# Patient Record
Sex: Female | Born: 1950
Health system: Southern US, Community
[De-identification: ages and names within clinical notes are randomized; demographics above are authoritative.]

## PROBLEM LIST (undated history)

## (undated) ENCOUNTER — Inpatient Hospital Stay: Admission: EM | Payer: Self-pay | Source: Home / Self Care

## (undated) DIAGNOSIS — E559 Vitamin D deficiency, unspecified: Secondary | ICD-10-CM

## (undated) DIAGNOSIS — I10 Essential (primary) hypertension: Secondary | ICD-10-CM

## (undated) DIAGNOSIS — I7090 Unspecified atherosclerosis: Secondary | ICD-10-CM

## (undated) DIAGNOSIS — Z801 Family history of malignant neoplasm of trachea, bronchus and lung: Secondary | ICD-10-CM

## (undated) DIAGNOSIS — K589 Irritable bowel syndrome without diarrhea: Secondary | ICD-10-CM

## (undated) DIAGNOSIS — D649 Anemia, unspecified: Secondary | ICD-10-CM

## (undated) DIAGNOSIS — M479 Spondylosis, unspecified: Secondary | ICD-10-CM

## (undated) DIAGNOSIS — K219 Gastro-esophageal reflux disease without esophagitis: Secondary | ICD-10-CM

## (undated) DIAGNOSIS — R042 Hemoptysis: Secondary | ICD-10-CM

## (undated) DIAGNOSIS — Z923 Personal history of irradiation: Secondary | ICD-10-CM

## (undated) DIAGNOSIS — G43909 Migraine, unspecified, not intractable, without status migrainosus: Secondary | ICD-10-CM

## (undated) DIAGNOSIS — R918 Other nonspecific abnormal finding of lung field: Secondary | ICD-10-CM

## (undated) DIAGNOSIS — E785 Hyperlipidemia, unspecified: Secondary | ICD-10-CM

## (undated) HISTORY — DX: Family history of malignant neoplasm of trachea, bronchus and lung: Z80.1

## (undated) HISTORY — DX: Essential (primary) hypertension: I10

## (undated) HISTORY — DX: Gastro-esophageal reflux disease without esophagitis: K21.9

## (undated) HISTORY — DX: Hemoptysis: R04.2

## (undated) HISTORY — DX: Spondylosis, unspecified: M47.9

## (undated) HISTORY — PX: TONSILLECTOMY: SUR1361

## (undated) HISTORY — DX: Other nonspecific abnormal finding of lung field: R91.8

## (undated) HISTORY — DX: Irritable bowel syndrome, unspecified: K58.9

## (undated) HISTORY — PX: EYE SURGERY: SHX253

## (undated) HISTORY — DX: Vitamin D deficiency, unspecified: E55.9

## (undated) HISTORY — DX: Unspecified atherosclerosis: I70.90

## (undated) HISTORY — DX: Migraine, unspecified, not intractable, without status migrainosus: G43.909

## (undated) HISTORY — DX: Hyperlipidemia, unspecified: E78.5

## (undated) HISTORY — DX: Anemia, unspecified: D64.9

---

## 2013-03-15 HISTORY — PX: BROW LIFT AND BLEPHAROPLASTY: SHX1271

## 2014-05-19 HISTORY — PX: COLONOSCOPY: SHX174

## 2014-06-23 HISTORY — PX: CATARACT EXTRACTION, BILATERAL: SHX1313

## 2017-04-18 DIAGNOSIS — C801 Malignant (primary) neoplasm, unspecified: Secondary | ICD-10-CM

## 2017-04-18 HISTORY — PX: ESOPHAGOGASTRODUODENOSCOPY: SHX1529

## 2017-04-18 HISTORY — DX: Malignant (primary) neoplasm, unspecified: C80.1

## 2018-02-28 ENCOUNTER — Other Ambulatory Visit: Payer: Self-pay | Admitting: Emergency Medicine

## 2018-02-28 ENCOUNTER — Ambulatory Visit
Admission: RE | Admit: 2018-02-28 | Discharge: 2018-02-28 | Disposition: A | Discharge status: Home / Self Care | Payer: Self-pay | Source: Ambulatory Visit | Attending: Emergency Medicine | Admitting: Emergency Medicine

## 2018-02-28 DIAGNOSIS — R911 Solitary pulmonary nodule: Secondary | ICD-10-CM

## 2018-03-01 ENCOUNTER — Ambulatory Visit (INDEPENDENT_AMBULATORY_CARE_PROVIDER_SITE_OTHER)
Admission: RE | Admit: 2018-03-01 | Discharge: 2018-03-01 | Disposition: A | Discharge status: Home / Self Care | Payer: Medicare Other | Source: Ambulatory Visit | Attending: Emergency Medicine | Admitting: Emergency Medicine

## 2018-03-01 ENCOUNTER — Telehealth: Payer: Self-pay | Admitting: Emergency Medicine

## 2018-03-01 ENCOUNTER — Ambulatory Visit (INDEPENDENT_AMBULATORY_CARE_PROVIDER_SITE_OTHER): Payer: Medicare Other | Admitting: Emergency Medicine

## 2018-03-01 ENCOUNTER — Encounter: Payer: Self-pay | Admitting: *Deleted

## 2018-03-01 VITALS — BP 138/84 | HR 96 | Ht 64.75 in | Wt 148.0 lb

## 2018-03-01 DIAGNOSIS — R918 Other nonspecific abnormal finding of lung field: Secondary | ICD-10-CM | POA: Diagnosis not present

## 2018-03-01 DIAGNOSIS — C3490 Malignant neoplasm of unspecified part of unspecified bronchus or lung: Secondary | ICD-10-CM | POA: Insufficient documentation

## 2018-03-01 NOTE — Patient Instructions (Signed)
We will arrange for navigational bronchoscopy in [facility] to further evaluate your lung nodule. We will work on obtaining a modified CT scan of your chest from [place].  If this is not possible then we will arrange to repeat your CT scan in [place] close to the time of the procedure. Follow with Dr Lamonte Sakai in 1 month to review testing results

## 2018-03-01 NOTE — Progress Notes (Signed)
Subjective:    Patient ID: [name], female    DOB: 27-Apr-1950, 67 y.o.   MRN: [id number]  HPI 67 year old never smoker with a history of hypertension, IBS, hyperlipidemia, GERD, allergies.  She is referred today for evaluation of an abnormal CT scan of her chest.   She noted some streaks of blood in her mucus about 6 months ago.  She had an ENT eval that was unremarkable. The amout progressed since September after an apparent URI.  This prompted a CT scan of her chest done on 02/23/2018 in [place] which I have reviewed.  There is an irregularly-shaped left lung central superior mass-like lesion. Appears to be in the medial LUL. She had a quant-GOLD done at her PCP, not yet back yet. She has had a mold exposure, unknown whether she has ever been exposed to TB.   Her cough is rare, but she does still see streaks of blood in the mucous. No CP. She can feel some tachycardia with exertion. Has to stop to rest with stairs, has dyspnea. No wheeze. No epistaxis.    Review of Systems  Constitutional: Negative for fever and unexpected weight change.  HENT: Positive for congestion. Negative for dental problem, ear pain, nosebleeds, postnasal drip, rhinorrhea, sinus pressure, sneezing, sore throat and trouble swallowing.   Eyes: Negative for redness and itching.  Respiratory: Positive for cough and shortness of breath. Negative for chest tightness and wheezing.   Cardiovascular: Negative for palpitations and leg swelling.  Gastrointestinal: Negative for nausea and vomiting.  Genitourinary: Negative for dysuria.  Musculoskeletal: Negative for joint swelling.  Skin: Negative for rash.  Neurological: Positive for headaches.  Hematological: Does not bruise/bleed easily.  Psychiatric/Behavioral: Negative for dysphoric mood. The patient is not nervous/anxious.    Past Medical History:  Diagnosis Date  . Anemia   . Atherosclerosis   . GERD (gastroesophageal reflux disease)   . Hemoptysis   .  Hyperlipidemia   . Hypertension   . IBS (irritable bowel syndrome)   . Lung mass   . Migraines   . Spondylosis   . Vitamin D deficiency      Family History  Problem Relation Age of Onset  . Dementia Mother   . Hyperlipidemia Mother   . Hypertension Mother   . Thyroid disease Mother   . Irritable bowel syndrome Mother   . Aortic stenosis Mother   . Cancer Father   . Lung cancer Sister      Social History   Socioeconomic History  . Marital status: Married    Spouse name: Not on file  . Number of children: Not on file  . Years of education: Not on file  . Highest education level: Not on file  Occupational History  . Not on file  Social Needs  . Financial resource strain: Not on file  . Food insecurity:    Worry: Not on file    Inability: Not on file  . Transportation needs:    Medical: Not on file    Non-medical: Not on file  Tobacco Use  . Smoking status: Never Smoker  . Smokeless tobacco: Never Used  Substance and Sexual Activity  . Alcohol use: Not on file  . Drug use: Not on file  . Sexual activity: Not on file  Lifestyle  . Physical activity:    Days per week: Not on file    Minutes per session: Not on file  . Stress: Not on file  Relationships  . Social connections:  Talks on phone: Not on file    Gets together: Not on file    Attends religious service: Not on file    Active member of club or organization: Not on file    Attends meetings of clubs or organizations: Not on file    Relationship status: Not on file  . Intimate partner violence:    Fear of current or ex partner: Not on file    Emotionally abused: Not on file    Physically abused: Not on file    Forced sexual activity: Not on file  Other Topics Concern  . Not on file  Social History Narrative  . 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    Allergies  Allergen Reactions  . Proton Pump Inhibitors Anaphylaxis  . Wasp Venom  Protein Swelling  . Aspartame Other (See Comments)    Severe diarrhea  . Avocado Other (See Comments)    Chest tightness, nauseous  . Cefaclor Hives  . Chloroxine Other (See Comments)    Severe migraine  . Ciprofloxacin Hcl Hives  . Clarithromycin Hives  . Gatifloxacin Hives  . Lactase Other (See Comments)  . Molds & Smuts Other (See Comments)  . Other Itching  . Penicillins Hives  . Pork-Derived Products Other (See Comments)    religious  . Soap Other (See Comments)    Soft soap- severe dermatitis  . Sulfa Antibiotics Hives  . Sulfamethoxazole-Trimethoprim Hives  . Sulfites Swelling    Turn red and throat swells     Outpatient Medications Prior to Visit  Medication Sig Dispense Refill  . Olopatadine HCl (PATANASE) 0.6 % SOLN Place 2 sprays into the nose 2 (two) times daily.    . Omega-3 1000 MG CAPS Take 2 capsules by mouth daily.    . rizatriptan (MAXALT) 10 MG tablet Take 10 mg by mouth as needed for migraine. May repeat in 2 hours if needed    . rosuvastatin (CRESTOR) 5 MG tablet Take 5 mg by mouth daily.    . sucralfate (CARAFATE) 1 g tablet Take 1 g by mouth 2 (two) times daily.    Marland Kitchen VITAMIN D PO Take 4,000 Units by mouth daily.    Marland Kitchen zolpidem (AMBIEN) 10 MG tablet Take 10 mg by mouth at bedtime as needed for sleep.    . clonazePAM (KLONOPIN) 0.5 MG tablet Take 0.5 mg by mouth 3 (three) times daily as needed for anxiety.    . metoprolol tartrate (LOPRESSOR) 25 MG tablet Take 25 mg by mouth daily as needed.    . promethazine (PHENERGAN) 12.5 MG suppository Place 12.5 mg rectally every 6 (six) hours as needed for nausea or vomiting.     No facility-administered medications prior to visit.         Objective:   Physical Exam Vitals:   03/01/18 1100  BP: 138/84  Pulse: 96  SpO2: 96%  Weight: 148 lb (67.1 kg)  Height: 5' 4.75" (1.645 m)   Gen: Pleasant, well-nourished, in no distress,  normal affect  ENT: No lesions,  mouth clear,  oropharynx clear, no postnasal  drip  Neck: No JVD, no stridor  Lungs: No use of accessory muscles, no crackles or wheezing  Cardiovascular: RRR, heart sounds normal, no murmur or gallops, no peripheral edema  Musculoskeletal: No deformities, no cyanosis or clubbing  Neuro: alert, non focal  Skin: Warm, no lesions or rash  Assessment & Plan:  Lung mass Medial lung opacity on the left that appears to be in the left upper lobe, just over 3 cm in its largest dimension.  Based on location I think navigational bronchoscopy is the most likely to achieve an adequate tissue diagnosis.  She had a QuantiFERON gold which is still pending and we will follow-up these results.  I will communicate with the radiology group in [place] to see if we can convert her CT over to super D format.  If not then we will repeat her CT locally.  Baltazar Apo, MD, PhD 03/01/2018, 12:02 PM [facility] [phone] or if no answer [phone]

## 2018-03-01 NOTE — H&P (View-Only) (Signed)
Subjective:    Patient ID: [name], female    DOB: [birth year], 67 y.o.   MRN: [id number]  HPI 67 year old never smoker with a history of hypertension, IBS, hyperlipidemia, GERD, allergies.  She is referred today for evaluation of an abnormal CT scan of her chest.   She noted some streaks of blood in her mucus about 6 months ago.  She had an ENT eval that was unremarkable. The amout progressed since September after an apparent URI.  This prompted a CT scan of her chest done on 02/23/2018 in [place] which I have reviewed.  There is an irregularly-shaped left lung central superior mass-like lesion. Appears to be in the medial LUL. She had a quant-GOLD done at her PCP, not yet back yet. She has had a mold exposure, unknown whether she has ever been exposed to TB.   Her cough is rare, but she does still see streaks of blood in the mucous. No CP. She can feel some tachycardia with exertion. Has to stop to rest with stairs, has dyspnea. No wheeze. No epistaxis.    Review of Systems  Constitutional: Negative for fever and unexpected weight change.  HENT: Positive for congestion. Negative for dental problem, ear pain, nosebleeds, postnasal drip, rhinorrhea, sinus pressure, sneezing, sore throat and trouble swallowing.   Eyes: Negative for redness and itching.  Respiratory: Positive for cough and shortness of breath. Negative for chest tightness and wheezing.   Cardiovascular: Negative for palpitations and leg swelling.  Gastrointestinal: Negative for nausea and vomiting.  Genitourinary: Negative for dysuria.  Musculoskeletal: Negative for joint swelling.  Skin: Negative for rash.  Neurological: Positive for headaches.  Hematological: Does not bruise/bleed easily.  Psychiatric/Behavioral: Negative for dysphoric mood. The patient is not nervous/anxious.    Past Medical History:  Diagnosis Date  . Anemia   . Atherosclerosis   . GERD (gastroesophageal reflux disease)   . Hemoptysis   .  Hyperlipidemia   . Hypertension   . IBS (irritable bowel syndrome)   . Lung mass   . Migraines   . Spondylosis   . Vitamin D deficiency      Family History  Problem Relation Age of Onset  . Dementia Mother   . Hyperlipidemia Mother   . Hypertension Mother   . Thyroid disease Mother   . Irritable bowel syndrome Mother   . Aortic stenosis Mother   . Cancer Father   . Lung cancer Sister      Social History   Socioeconomic History  . Marital status: Married    Spouse name: Not on file  . Number of children: Not on file  . Years of education: Not on file  . Highest education level: Not on file  Occupational History  . Not on file  Social Needs  . Financial resource strain: Not on file  . Food insecurity:    Worry: Not on file    Inability: Not on file  . Transportation needs:    Medical: Not on file    Non-medical: Not on file  Tobacco Use  . Smoking status: Never Smoker  . Smokeless tobacco: Never Used  Substance and Sexual Activity  . Alcohol use: Not on file  . Drug use: Not on file  . Sexual activity: Not on file  Lifestyle  . Physical activity:    Days per week: Not on file    Minutes per session: Not on file  . Stress: Not on file  Relationships  . Social connections:  Talks on phone: Not on file    Gets together: Not on file    Attends religious service: Not on file    Active member of club or organization: Not on file    Attends meetings of clubs or organizations: Not on file    Relationship status: Not on file  . Intimate partner violence:    Fear of current or ex partner: Not on file    Emotionally abused: Not on file    Physically abused: Not on file    Forced sexual activity: Not on file  Other Topics Concern  . Not on file  Social History Narrative  . 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    Allergies  Allergen Reactions  . Proton Pump Inhibitors Anaphylaxis  . Wasp Venom  Protein Swelling  . Aspartame Other (See Comments)    Severe diarrhea  . Avocado Other (See Comments)    Chest tightness, nauseous  . Cefaclor Hives  . Chloroxine Other (See Comments)    Severe migraine  . Ciprofloxacin Hcl Hives  . Clarithromycin Hives  . Gatifloxacin Hives  . Lactase Other (See Comments)  . Molds & Smuts Other (See Comments)  . Other Itching  . Penicillins Hives  . Pork-Derived Products Other (See Comments)    religious  . Soap Other (See Comments)    Soft soap- severe dermatitis  . Sulfa Antibiotics Hives  . Sulfamethoxazole-Trimethoprim Hives  . Sulfites Swelling    Turn red and throat swells     Outpatient Medications Prior to Visit  Medication Sig Dispense Refill  . Olopatadine HCl (PATANASE) 0.6 % SOLN Place 2 sprays into the nose 2 (two) times daily.    . Omega-3 1000 MG CAPS Take 2 capsules by mouth daily.    . rizatriptan (MAXALT) 10 MG tablet Take 10 mg by mouth as needed for migraine. May repeat in 2 hours if needed    . rosuvastatin (CRESTOR) 5 MG tablet Take 5 mg by mouth daily.    . sucralfate (CARAFATE) 1 g tablet Take 1 g by mouth 2 (two) times daily.    Marland Kitchen VITAMIN D PO Take 4,000 Units by mouth daily.    Marland Kitchen zolpidem (AMBIEN) 10 MG tablet Take 10 mg by mouth at bedtime as needed for sleep.    . clonazePAM (KLONOPIN) 0.5 MG tablet Take 0.5 mg by mouth 3 (three) times daily as needed for anxiety.    . metoprolol tartrate (LOPRESSOR) 25 MG tablet Take 25 mg by mouth daily as needed.    . promethazine (PHENERGAN) 12.5 MG suppository Place 12.5 mg rectally every 6 (six) hours as needed for nausea or vomiting.     No facility-administered medications prior to visit.         Objective:   Physical Exam Vitals:   03/01/18 1100  BP: 138/84  Pulse: 96  SpO2: 96%  Weight: 148 lb (67.1 kg)  Height: 5' 4.75" (1.645 m)   Gen: Pleasant, well-nourished, in no distress,  normal affect  ENT: No lesions,  mouth clear,  oropharynx clear, no postnasal  drip  Neck: No JVD, no stridor  Lungs: No use of accessory muscles, no crackles or wheezing  Cardiovascular: RRR, heart sounds normal, no murmur or gallops, no peripheral edema  Musculoskeletal: No deformities, no cyanosis or clubbing  Neuro: alert, non focal  Skin: Warm, no lesions or rash  Assessment & Plan:  Lung mass Medial lung opacity on the left that appears to be in the left upper lobe, just over 3 cm in its largest dimension.  Based on location I think navigational bronchoscopy is the most likely to achieve an adequate tissue diagnosis.  She had a QuantiFERON gold which is still pending and we will follow-up these results.  I will communicate with the radiology group in [place] to see if we can convert her CT over to super D format.  If not then we will repeat her CT locally.  Baltazar Apo, MD, PhD 03/01/2018, 12:02 PM [facility] [phone] or if no answer [phone]

## 2018-03-01 NOTE — Telephone Encounter (Signed)
Called and spoke with Anda Kraft she stated that she was able to perform the requested "slices". She stated that she will mail [place] the CT cd so that we will have the images. Nothing further needed.

## 2018-03-01 NOTE — Addendum Note (Signed)
Addended by: Desmond Dike C on: 03/01/2018 12:26 PM   Modules accepted: Orders

## 2018-03-01 NOTE — Assessment & Plan Note (Signed)
Medial lung opacity on the left that appears to be in the left upper lobe, just over 3 cm in its largest dimension.  Based on location I think navigational bronchoscopy is the most likely to achieve an adequate tissue diagnosis.  She had a QuantiFERON gold which is still pending and we will follow-up these results.  I will communicate with the radiology group in [place] to see if we can convert her CT over to super D format.  If not then we will repeat her CT locally.

## 2018-03-05 ENCOUNTER — Telehealth: Payer: Self-pay | Admitting: Emergency Medicine

## 2018-03-05 NOTE — Telephone Encounter (Signed)
Called number given, unable to reach anyone at extension given as this is not an extension. Spoke with office administration and asked for [name]. No one was available. Called patient. Unable to reach left message to give [place] a call back.

## 2018-03-05 NOTE — Pre-Procedure Instructions (Signed)
[name]  03/05/2018      [id number] [address], [place] [address] Phone: [phone] Fax: [phone]    Your procedure is scheduled on Wed., Nov. 20, 2019 from 10:48AM-12:18PM  Report to [facility] Admitting Entrance "A" at 8:45AM  Call this number if you have problems the morning of surgery:  [phone]   Remember:  Do not eat or drink after midnight on Nov. 19th    Take these medicines the morning of surgery with A SIP OF WATER: NONE  As of today, stop taking all Other Aspirin Products, Vitamins, Fish oils, and Herbal medications. Also stop all NSAIDS i.e. Advil, Ibuprofen, Motrin, Aleve, Anaprox, Naproxen, BC, Goody Powders, and all Supplements.   Do not wear jewelry, make-up or nail polish.  Do not wear lotions, powders, or perfumes, or deodorant.  Do not shave 48 hours prior to surgery.  .  Do not bring valuables to the hospital.  [facility] is not responsible for any belongings or valuables.  Contacts, dentures or bridgework may not be worn into surgery.  Leave your suitcase in the car.  After surgery it may be brought to your room.  For patients admitted to the hospital, discharge time will be determined by your treatment team.  Patients discharged the day of surgery will not be allowed to drive home.   Special instructions:   [place]- Preparing For Surgery  Before surgery, you can play an important role. Because skin is not sterile, your skin needs to be as free of germs as possible. You can reduce the number of germs on your skin by washing with CHG (chlorahexidine gluconate) Soap before surgery.  CHG is an antiseptic cleaner which kills germs and bonds with the skin to continue killing germs even after washing.    Oral Hygiene is also important to reduce your risk of infection.  Remember - BRUSH YOUR TEETH THE MORNING OF SURGERY WITH YOUR REGULAR  TOOTHPASTE  Please do not use if you have an allergy to CHG or antibacterial soaps. If your skin becomes reddened/irritated stop using the CHG.  Do not shave (including legs and underarms) for at least 48 hours prior to first CHG shower. It is OK to shave your face.  Please follow these instructions carefully.   1. Shower the NIGHT BEFORE SURGERY and the MORNING OF SURGERY with CHG.   2. If you chose to wash your hair, wash your hair first as usual with your normal shampoo.  3. After you shampoo, rinse your hair and body thoroughly to remove the shampoo.  4. Use CHG as you would any other liquid soap. You can apply CHG directly to the skin and wash gently with a scrungie or a clean washcloth.   5. Apply the CHG Soap to your body ONLY FROM THE NECK DOWN.  Do not use on open wounds or open sores. Avoid contact with your eyes, ears, mouth and genitals (private parts). Wash Face and genitals (private parts)  with your normal soap.  6. Wash thoroughly, paying special attention to the area where your surgery will be performed.  7. Thoroughly rinse your body with warm water from the neck down.  8. DO NOT shower/wash with your normal soap after using and rinsing off the CHG Soap.  9. Pat yourself dry with a CLEAN TOWEL.  10. Wear CLEAN PAJAMAS to bed the night  before surgery, wear comfortable clothes the morning of surgery  11. Place CLEAN SHEETS on your bed the night of your first shower and DO NOT SLEEP WITH PETS.  Day of Surgery:  Do not apply any deodorants/lotions.  Please wear clean clothes to the hospital/surgery center.   Remember to brush your teeth WITH YOUR REGULAR TOOTHPASTE.  Please read over the following fact sheets that you were given. Pain Booklet, Coughing and Deep Breathing and Surgical Site Infection Prevention

## 2018-03-06 ENCOUNTER — Encounter (HOSPITAL_COMMUNITY): Payer: Self-pay

## 2018-03-06 ENCOUNTER — Other Ambulatory Visit: Payer: Self-pay

## 2018-03-06 ENCOUNTER — Encounter (HOSPITAL_COMMUNITY)
Admission: RE | Admit: 2018-03-06 | Discharge: 2018-03-06 | Disposition: A | Discharge status: Home / Self Care | Payer: Medicare Other | Source: Ambulatory Visit | Attending: Emergency Medicine | Admitting: Emergency Medicine

## 2018-03-06 ENCOUNTER — Ambulatory Visit (HOSPITAL_COMMUNITY): Admission: RE | Admit: 2018-03-06 | Payer: Medicare Other | Source: Ambulatory Visit

## 2018-03-06 DIAGNOSIS — Z881 Allergy status to other antibiotic agents status: Secondary | ICD-10-CM | POA: Diagnosis not present

## 2018-03-06 DIAGNOSIS — E559 Vitamin D deficiency, unspecified: Secondary | ICD-10-CM | POA: Diagnosis not present

## 2018-03-06 DIAGNOSIS — Z882 Allergy status to sulfonamides status: Secondary | ICD-10-CM | POA: Diagnosis not present

## 2018-03-06 DIAGNOSIS — K219 Gastro-esophageal reflux disease without esophagitis: Secondary | ICD-10-CM | POA: Diagnosis not present

## 2018-03-06 DIAGNOSIS — R911 Solitary pulmonary nodule: Secondary | ICD-10-CM | POA: Diagnosis present

## 2018-03-06 DIAGNOSIS — Z9889 Other specified postprocedural states: Secondary | ICD-10-CM

## 2018-03-06 DIAGNOSIS — Z880 Allergy status to penicillin: Secondary | ICD-10-CM | POA: Diagnosis not present

## 2018-03-06 DIAGNOSIS — C3432 Malignant neoplasm of lower lobe, left bronchus or lung: Secondary | ICD-10-CM | POA: Diagnosis not present

## 2018-03-06 DIAGNOSIS — I10 Essential (primary) hypertension: Secondary | ICD-10-CM | POA: Insufficient documentation

## 2018-03-06 DIAGNOSIS — Z01818 Encounter for other preprocedural examination: Secondary | ICD-10-CM

## 2018-03-06 DIAGNOSIS — K589 Irritable bowel syndrome without diarrhea: Secondary | ICD-10-CM | POA: Diagnosis not present

## 2018-03-06 DIAGNOSIS — Z79899 Other long term (current) drug therapy: Secondary | ICD-10-CM | POA: Diagnosis not present

## 2018-03-06 DIAGNOSIS — E785 Hyperlipidemia, unspecified: Secondary | ICD-10-CM | POA: Diagnosis not present

## 2018-03-06 LAB — COMPREHENSIVE METABOLIC PANEL
ALT: 20 U/L (ref 0–44)
AST: 21 U/L (ref 15–41)
Albumin: 4 g/dL (ref 3.5–5.0)
Alkaline Phosphatase: 45 U/L (ref 38–126)
Anion gap: 8 (ref 5–15)
BUN: 20 mg/dL (ref 8–23)
CO2: 24 mmol/L (ref 22–32)
Calcium: 9.3 mg/dL (ref 8.9–10.3)
Chloride: 105 mmol/L (ref 98–111)
Creatinine, Ser: 0.85 mg/dL (ref 0.44–1.00)
GFR calc Af Amer: >60 mL/min (ref >60)
GFR calc non Af Amer: >60 mL/min (ref >60)
Glucose, Bld: 130 mg/dL — ABNORMAL HIGH (ref 70–99)
Potassium: 3.4 mmol/L — ABNORMAL LOW (ref 3.5–5.1)
Sodium: 137 mmol/L (ref 135–145)
Total Bilirubin: 0.4 mg/dL (ref 0.3–1.2)
Total Protein: 6.7 g/dL (ref 6.5–8.1)

## 2018-03-06 LAB — CBC
HCT: 36.6 % (ref 36.0–46.0)
Hemoglobin: 11.5 g/dL — ABNORMAL LOW (ref 12.0–15.0)
MCH: 30.3 pg (ref 26.0–34.0)
MCHC: 31.4 g/dL (ref 30.0–36.0)
MCV: 96.3 fL (ref 80.0–100.0)
Platelets: 286 10*3/uL (ref 150–400)
RBC: 3.8 MIL/uL — ABNORMAL LOW (ref 3.87–5.11)
RDW: 11.9 % (ref 11.5–15.5)
WBC: 9.2 10*3/uL (ref 4.0–10.5)
nRBC: 0 % (ref 0.0–0.2)

## 2018-03-06 LAB — PROTIME-INR
INR: 0.94
Prothrombin Time: 12.5 seconds (ref 11.4–15.2)

## 2018-03-06 LAB — APTT: aPTT: 31 seconds (ref 24–36)

## 2018-03-06 NOTE — Progress Notes (Signed)
PCP - Dr. Lillette Boxer Cardiologist - Dr. Jennette Dubin  Chest x-ray - 03/06/18 EKG - 03/06/18 Stress Test - 5 years ago in [place]  ECHO - requested; 2018? At [facility] Cardiac Cath - denies  Sleep Study - denies  Aspirin Instructions:N/A  Anesthesia review: Yes; requested ECHo results.   Patient denies shortness of breath, fever, cough and chest pain at PAT appointment   Patient verbalized understanding of instructions that were given to them at the PAT appointment. Patient was also instructed that they will need to review over the PAT instructions again at home before surgery.

## 2018-03-06 NOTE — Progress Notes (Signed)
Pt not given CHG soap pre-operatively d/t severe allergy.

## 2018-03-06 NOTE — Telephone Encounter (Signed)
Attempted to contact pt. I did not receive an answer. I have left a message for pt to return our call.  

## 2018-03-07 ENCOUNTER — Encounter (HOSPITAL_COMMUNITY): Payer: Self-pay | Admitting: *Deleted

## 2018-03-07 ENCOUNTER — Ambulatory Visit (HOSPITAL_COMMUNITY): Payer: Medicare Other | Admitting: Certified Registered"

## 2018-03-07 ENCOUNTER — Ambulatory Visit (HOSPITAL_COMMUNITY)
Admission: RE | Admit: 2018-03-07 | Discharge: 2018-03-07 | Disposition: A | Discharge status: Home / Self Care | Payer: Medicare Other | Source: Ambulatory Visit | Attending: Emergency Medicine | Admitting: Emergency Medicine

## 2018-03-07 ENCOUNTER — Encounter (HOSPITAL_COMMUNITY)
Admission: RE | Disposition: A | Discharge status: Home / Self Care | Payer: Self-pay | Source: Ambulatory Visit | Attending: Emergency Medicine

## 2018-03-07 ENCOUNTER — Ambulatory Visit (HOSPITAL_COMMUNITY): Payer: Medicare Other

## 2018-03-07 ENCOUNTER — Ambulatory Visit (HOSPITAL_COMMUNITY): Payer: Medicare Other | Admitting: Physician Assistant

## 2018-03-07 DIAGNOSIS — I10 Essential (primary) hypertension: Secondary | ICD-10-CM | POA: Diagnosis not present

## 2018-03-07 DIAGNOSIS — Z9889 Other specified postprocedural states: Secondary | ICD-10-CM

## 2018-03-07 DIAGNOSIS — Z882 Allergy status to sulfonamides status: Secondary | ICD-10-CM | POA: Insufficient documentation

## 2018-03-07 DIAGNOSIS — K219 Gastro-esophageal reflux disease without esophagitis: Secondary | ICD-10-CM | POA: Insufficient documentation

## 2018-03-07 DIAGNOSIS — Z881 Allergy status to other antibiotic agents status: Secondary | ICD-10-CM | POA: Insufficient documentation

## 2018-03-07 DIAGNOSIS — Z880 Allergy status to penicillin: Secondary | ICD-10-CM | POA: Insufficient documentation

## 2018-03-07 DIAGNOSIS — E559 Vitamin D deficiency, unspecified: Secondary | ICD-10-CM | POA: Insufficient documentation

## 2018-03-07 DIAGNOSIS — C3432 Malignant neoplasm of lower lobe, left bronchus or lung: Secondary | ICD-10-CM | POA: Diagnosis not present

## 2018-03-07 DIAGNOSIS — R918 Other nonspecific abnormal finding of lung field: Secondary | ICD-10-CM | POA: Diagnosis not present

## 2018-03-07 DIAGNOSIS — Z79899 Other long term (current) drug therapy: Secondary | ICD-10-CM | POA: Insufficient documentation

## 2018-03-07 DIAGNOSIS — E785 Hyperlipidemia, unspecified: Secondary | ICD-10-CM | POA: Diagnosis not present

## 2018-03-07 DIAGNOSIS — K589 Irritable bowel syndrome without diarrhea: Secondary | ICD-10-CM | POA: Insufficient documentation

## 2018-03-07 DIAGNOSIS — C3490 Malignant neoplasm of unspecified part of unspecified bronchus or lung: Secondary | ICD-10-CM | POA: Diagnosis present

## 2018-03-07 DIAGNOSIS — R911 Solitary pulmonary nodule: Secondary | ICD-10-CM

## 2018-03-07 HISTORY — PX: VIDEO BRONCHOSCOPY WITH ENDOBRONCHIAL NAVIGATION: SHX6175

## 2018-03-07 SURGERY — VIDEO BRONCHOSCOPY WITH ENDOBRONCHIAL NAVIGATION
Anesthesia: General | Site: Bronchus

## 2018-03-07 MED ORDER — FENTANYL CITRATE (PF) 100 MCG/2ML IJ SOLN
INTRAMUSCULAR | Status: DC | PRN
  Administered 2018-03-07: 50 ug via INTRAVENOUS

## 2018-03-07 MED ORDER — FENTANYL CITRATE (PF) 250 MCG/5ML IJ SOLN
INTRAMUSCULAR | Status: AC
  Filled 2018-03-07: qty 5

## 2018-03-07 MED ORDER — OXYCODONE HCL 5 MG PO TABS: 5.0000 mg | ORAL_TABLET | Freq: Once | ORAL | Status: DC | PRN

## 2018-03-07 MED ORDER — ONDANSETRON HCL 4 MG/2ML IJ SOLN
INTRAMUSCULAR | Status: AC
  Filled 2018-03-07: qty 2

## 2018-03-07 MED ORDER — 0.9 % SODIUM CHLORIDE (POUR BTL) OPTIME
TOPICAL | Status: DC | PRN
  Administered 2018-03-07: 1000 mL

## 2018-03-07 MED ORDER — ACETAMINOPHEN 10 MG/ML IV SOLN: 1000.0000 mg | Freq: Once | INTRAVENOUS | Status: DC | PRN

## 2018-03-07 MED ORDER — LIDOCAINE 2% (20 MG/ML) 5 ML SYRINGE
INTRAMUSCULAR | Status: DC | PRN
  Administered 2018-03-07: 60 mg via INTRAVENOUS

## 2018-03-07 MED ORDER — OXYCODONE HCL 5 MG/5ML PO SOLN: 5.0000 mg | Freq: Once | ORAL | Status: DC | PRN

## 2018-03-07 MED ORDER — ARTIFICIAL TEARS OPHTHALMIC OINT
TOPICAL_OINTMENT | OPHTHALMIC | Status: AC
  Filled 2018-03-07: qty 3.5

## 2018-03-07 MED ORDER — DEXAMETHASONE SODIUM PHOSPHATE 10 MG/ML IJ SOLN
INTRAMUSCULAR | Status: AC
  Filled 2018-03-07: qty 1

## 2018-03-07 MED ORDER — ROCURONIUM BROMIDE 50 MG/5ML IV SOSY
PREFILLED_SYRINGE | INTRAVENOUS | Status: AC
  Filled 2018-03-07: qty 5

## 2018-03-07 MED ORDER — LACTATED RINGERS IV SOLN
INTRAVENOUS | Status: DC
  Administered 2018-03-07: 10:00:00 via INTRAVENOUS

## 2018-03-07 MED ORDER — PROPOFOL 10 MG/ML IV BOLUS
INTRAVENOUS | Status: AC
  Filled 2018-03-07: qty 20

## 2018-03-07 MED ORDER — MIDAZOLAM HCL 2 MG/2ML IJ SOLN
INTRAMUSCULAR | Status: AC
  Filled 2018-03-07: qty 2

## 2018-03-07 MED ORDER — ACETAMINOPHEN 160 MG/5ML PO SOLN: 1000.0000 mg | Freq: Once | ORAL | Status: DC | PRN

## 2018-03-07 MED ORDER — LIDOCAINE 2% (20 MG/ML) 5 ML SYRINGE
INTRAMUSCULAR | Status: AC
  Filled 2018-03-07: qty 5

## 2018-03-07 MED ORDER — SODIUM CHLORIDE 0.9 % IV SOLN
INTRAVENOUS | Status: DC | PRN
  Administered 2018-03-07: 100 ug/min via INTRAVENOUS

## 2018-03-07 MED ORDER — PROPOFOL 10 MG/ML IV BOLUS
INTRAVENOUS | Status: DC | PRN
  Administered 2018-03-07: 40 mg via INTRAVENOUS
  Administered 2018-03-07: 30 mg via INTRAVENOUS
  Administered 2018-03-07: 130 mg via INTRAVENOUS

## 2018-03-07 MED ORDER — SUGAMMADEX SODIUM 200 MG/2ML IV SOLN
INTRAVENOUS | Status: DC | PRN
  Administered 2018-03-07: 140 mg via INTRAVENOUS
  Administered 2018-03-07: 60 mg via INTRAVENOUS

## 2018-03-07 MED ORDER — ACETAMINOPHEN 500 MG PO TABS: 1000.0000 mg | ORAL_TABLET | Freq: Once | ORAL | Status: DC | PRN

## 2018-03-07 MED ORDER — SUGAMMADEX SODIUM 200 MG/2ML IV SOLN
INTRAVENOUS | Status: AC
  Filled 2018-03-07: qty 2

## 2018-03-07 MED ORDER — MIDAZOLAM HCL 2 MG/2ML IJ SOLN
INTRAMUSCULAR | Status: DC | PRN
  Administered 2018-03-07: 2 mg via INTRAVENOUS

## 2018-03-07 MED ORDER — FENTANYL CITRATE (PF) 100 MCG/2ML IJ SOLN: 25.0000 ug | INTRAMUSCULAR | Status: DC | PRN

## 2018-03-07 MED ORDER — ONDANSETRON HCL 4 MG/2ML IJ SOLN
INTRAMUSCULAR | Status: DC | PRN
  Administered 2018-03-07: 4 mg via INTRAVENOUS

## 2018-03-07 MED ORDER — DEXAMETHASONE SODIUM PHOSPHATE 10 MG/ML IJ SOLN
INTRAMUSCULAR | Status: DC | PRN
  Administered 2018-03-07: 10 mg via INTRAVENOUS

## 2018-03-07 MED ORDER — ROCURONIUM BROMIDE 50 MG/5ML IV SOSY
PREFILLED_SYRINGE | INTRAVENOUS | Status: DC | PRN
  Administered 2018-03-07: 20 mg via INTRAVENOUS
  Administered 2018-03-07: 50 mg via INTRAVENOUS

## 2018-03-07 SURGICAL SUPPLY — 42 items
ADAPTER BRONCHOSCOPE OLYMPUS (ADAPTER) ×2 IMPLANT
ADAPTER VALVE BIOPSY EBUS (MISCELLANEOUS) IMPLANT
ADPTR VALVE BIOPSY EBUS (MISCELLANEOUS)
BRUSH CYTOL CELLEBRITY 1.5X140 (MISCELLANEOUS) ×2 IMPLANT
BRUSH SUPERTRAX BIOPSY (INSTRUMENTS) IMPLANT
BRUSH SUPERTRAX NDL-TIP CYTO (INSTRUMENTS) ×2 IMPLANT
CANISTER SUCT 3000ML PPV (MISCELLANEOUS) ×2 IMPLANT
CHANNEL WORK EXTEND EDGE 180 (KITS) IMPLANT
CHANNEL WORK EXTEND EDGE 90 (KITS) IMPLANT
CONT SPEC 4OZ CLIKSEAL STRL BL (MISCELLANEOUS) ×2 IMPLANT
COVER BACK TABLE 60X90IN (DRAPES) ×2 IMPLANT
COVER WAND RF STERILE (DRAPES) ×2 IMPLANT
FILTER STRAW FLUID ASPIR (MISCELLANEOUS) IMPLANT
FORCEPS BIOP SUPERTRX PREMAR (INSTRUMENTS) ×2 IMPLANT
GAUZE SPONGE 4X4 12PLY STRL (GAUZE/BANDAGES/DRESSINGS) ×2 IMPLANT
GLOVE BIO SURGEON STRL SZ7.5 (GLOVE) ×4 IMPLANT
GOWN STRL REUS W/ TWL LRG LVL3 (GOWN DISPOSABLE) ×2 IMPLANT
GOWN STRL REUS W/TWL LRG LVL3 (GOWN DISPOSABLE) ×2
KIT CLEAN ENDO COMPLIANCE (KITS) ×2 IMPLANT
KIT ENDOBRONCHIAL EDGE FIRM (KITS) ×2 IMPLANT
KIT LOCATABLE GUIDE (CANNULA) IMPLANT
KIT MARKER FIDUCIAL DELIVERY (KITS) IMPLANT
KIT PROCEDURE EDGE 180 (KITS) IMPLANT
KIT PROCEDURE EDGE 90 (KITS) ×2 IMPLANT
KIT TURNOVER KIT B (KITS) ×2 IMPLANT
MARKER SKIN DUAL TIP RULER LAB (MISCELLANEOUS) ×2 IMPLANT
NEEDLE SUPERTRX PREMARK BIOPSY (NEEDLE) ×4 IMPLANT
NS IRRIG 1000ML POUR BTL (IV SOLUTION) ×2 IMPLANT
OIL SILICONE PENTAX (PARTS (SERVICE/REPAIRS)) ×2 IMPLANT
PAD ARMBOARD 7.5X6 YLW CONV (MISCELLANEOUS) ×4 IMPLANT
PATCHES PATIENT (LABEL) ×6 IMPLANT
SYR 20CC LL (SYRINGE) ×2 IMPLANT
SYR 20ML ECCENTRIC (SYRINGE) ×2 IMPLANT
SYR 50ML SLIP (SYRINGE) ×2 IMPLANT
TOWEL OR 17X24 6PK STRL BLUE (TOWEL DISPOSABLE) ×2 IMPLANT
TRAP SPECIMEN MUCOUS 40CC (MISCELLANEOUS) IMPLANT
TUBE CONNECTING 20X1/4 (TUBING) ×2 IMPLANT
UNDERPAD 30X30 (UNDERPADS AND DIAPERS) ×2 IMPLANT
VALVE BIOPSY  SINGLE USE (MISCELLANEOUS) ×1
VALVE BIOPSY SINGLE USE (MISCELLANEOUS) ×1 IMPLANT
VALVE SUCTION BRONCHIO DISP (MISCELLANEOUS) ×2 IMPLANT
WATER STERILE IRR 1000ML POUR (IV SOLUTION) ×2 IMPLANT

## 2018-03-07 NOTE — Interval H&P Note (Signed)
PCCM Interval Note  Patient presents today for further evaluation of a left upper lobe nodule and hemoptysis.  She has not had any difficulty since our last visit.  She continues to cough and the last time she saw some scant blood was 2 days ago.   Stable exam.  All questions answered regarding the procedure.  Plan is for navigational bronchoscopy with transbronchial brushings, biopsies and washings of a left upper lobe nodule.  BMP Latest Ref Rng & Units 03/06/2018  Glucose 70 - 99 mg/dL 130(H)  BUN 8 - 23 mg/dL 20  Creatinine 0.44 - 1.00 mg/dL 0.85  Sodium 135 - 145 mmol/L 137  Potassium 3.5 - 5.1 mmol/L 3.4(L)  Chloride 98 - 111 mmol/L 105  CO2 22 - 32 mmol/L 24  Calcium 8.9 - 10.3 mg/dL 9.3   CBC Latest Ref Rng & Units 03/06/2018  WBC 4.0 - 10.5 K/uL 9.2  Hemoglobin 12.0 - 15.0 g/dL 11.5(L)  Hematocrit 36.0 - 46.0 % 36.6  Platelets 150 - 400 K/uL 286   No barriers identified.  Plan to proceed with ENB today.  Baltazar Apo, MD, PhD 03/07/2018, 11:07 AM [facility] [phone] or if no answer [phone]

## 2018-03-07 NOTE — Transfer of Care (Signed)
Immediate Anesthesia Transfer of Care Note  Patient: [name]  Procedure(s) Performed: VIDEO BRONCHOSCOPY WITH ENDOBRONCHIAL NAVIGATION (N/A Bronchus)  Patient Location: PACU  Anesthesia Type:General  Level of Consciousness: awake, alert  and oriented  Airway & Oxygen Therapy: Patient Spontanous Breathing  Post-op Assessment: Report given to RN  Post vital signs: Reviewed and stable  Last Vitals:  Vitals Value Taken Time  BP    Temp    Pulse 94 03/07/2018 12:38 PM  Resp 18 03/07/2018 12:38 PM  SpO2 100 % 03/07/2018 12:38 PM  Vitals shown include unvalidated device data.  Last Pain:  Vitals:   03/07/18 0942  TempSrc:   PainSc: 0-No pain         Complications: No apparent anesthesia complications

## 2018-03-07 NOTE — Discharge Instructions (Signed)
Flexible Bronchoscopy, Care After These instructions give you information on caring for yourself after your procedure. Your doctor may also give you more specific instructions. Call your doctor if you have any problems or questions after your procedure. Follow these instructions at home:  Do not eat or drink anything for 2 hours after your procedure. If you try to eat or drink before the medicine wears off, food or drink could go into your lungs. You could also burn yourself.  After 2 hours have passed and when you can cough and gag normally, you may eat soft food and drink liquids slowly.  The day after the test, you may eat your normal diet.  You may do your normal activities.  Keep all doctor visits. Get help right away if:  You get more and more short of breath.  You get light-headed.  You feel like you are going to pass out (faint).  You have chest pain.  You have new problems that worry you.  You cough up more than a little blood.  You cough up more blood than before.  Please call our office for any questions or concerns.  [phone].   This information is not intended to replace advice given to you by your health care provider. Make sure you discuss any questions you have with your health care provider. Document Released: 01/30/2009 Document Revised: 09/10/2015 Document Reviewed: 12/07/2012 Elsevier Interactive Patient Education  2017 [employer].

## 2018-03-07 NOTE — Op Note (Signed)
Video Bronchoscopy with Electromagnetic Navigation Procedure Note  Date of Operation: 03/07/2018  Pre-op Diagnosis: Left lower lobe nodule, hemoptysis  Post-op Diagnosis: Same  Surgeon: Baltazar Apo  Assistants: None  Anesthesia: General endotracheal anesthesia  Operation: Flexible video fiberoptic bronchoscopy with electromagnetic navigation and biopsies.  Estimated Blood Loss: 30 cc  Complications: None apparent  Indications and History: [name] is a 67 y.o. female with history of hypertension, IBS, GERD, hyperlipidemia.  She began to have scant hemoptysis about 6 months ago.  This prompted chest imaging and ultimately a CT scan of her chest on 02/23/2018 that showed a centrally located left midlung mass.  Recommendation was made to achieve a tissue diagnosis via navigational bronchoscopy.  The risks, benefits, complications, treatment options and expected outcomes were discussed with the patient.  The possibilities of pneumothorax, pneumonia, reaction to medication, pulmonary aspiration, perforation of a viscus, bleeding, failure to diagnose a condition and creating a complication requiring transfusion or operation were discussed with the patient who freely signed the consent.    Description of Procedure: The patient was seen in the Preoperative Area, was examined and was deemed appropriate to proceed.  The patient was taken to OR 10, identified as [name] and the procedure verified as Flexible Video Fiberoptic Bronchoscopy.  A Time Out was held and the above information confirmed.   Prior to the date of the procedure a high-resolution CT scan of the chest was performed. Utilizing [place] a virtual tracheobronchial tree was generated to allow the creation of distinct navigation pathways to the patient's parenchymal abnormality. After being taken to the operating room general anesthesia was initiated and the patient  was orally intubated. The video  fiberoptic bronchoscope was introduced via the endotracheal tube and a general inspection was performed which showed normal airways throughout without any endobronchial lesions.  There was some old blood noted in 1 of the subsegments of the superior segment of the left lower lobe. The extendable working channel and locator guide were introduced into the bronchoscope. The distinct navigation pathways prepared prior to this procedure were then utilized to navigate to within 0.8 to 0.9 cm of patient's lesion identified on CT scan. The extendable working channel was secured into place and the locator guide was withdrawn. Under fluoroscopic guidance transbronchial needle brushings, transbronchial Wang needle biopsies, and transbronchial forceps biopsies were performed to be sent for cytology and pathology. A bronchioalveolar lavage was performed in the superior segment of the left lower lobe and sent for cytology. At the end of the procedure a general airway inspection was performed and there was no evidence of active bleeding. The bronchoscope was removed.  The patient tolerated the procedure well. There was no significant blood loss and there were no obvious complications. A post-procedural chest x-ray is pending.  Samples: 1. Transbronchial needle brushings from superior segment left lower lobe 2. Transbronchial Wang needle biopsies from superior segment left lower lobe 3. Transbronchial forceps biopsies from superior segment left lower lobe 4. Bronchoalveolar lavage from superior segment left lower lobe  Plans:  The patient will be discharged from the PACU to home when recovered from anesthesia and after chest x-ray is reviewed. We will review the cytology, pathology and microbiology results with the patient when they become available. Outpatient followup will be with Dr Lamonte Sakai.    Baltazar Apo, MD, PhD 03/07/2018, 12:28 PM [facility] [phone] or if no answer [phone]

## 2018-03-07 NOTE — Anesthesia Preprocedure Evaluation (Signed)
Anesthesia Evaluation  Patient identified by MRN, date of birth, ID band Patient awake    Reviewed: Allergy & Precautions, NPO status , Patient's Chart, lab work & pertinent test results  History of Anesthesia Complications Negative for: history of anesthetic complications  Airway Mallampati: III  TM Distance: >3 FB Neck ROM: Full    Dental  (+) Teeth Intact   Pulmonary  Lung mass left   breath sounds clear to auscultation       Cardiovascular hypertension,  Rhythm:Regular     Neuro/Psych  Headaches, negative psych ROS   GI/Hepatic Neg liver ROS, GERD  Medicated and Controlled,  Endo/Other  negative endocrine ROS  Renal/GU negative Renal ROS     Musculoskeletal  (+) Arthritis ,   Abdominal   Peds  Hematology  (+) anemia ,   Anesthesia Other Findings   Reproductive/Obstetrics                             Anesthesia Physical Anesthesia Plan  ASA: II  Anesthesia Plan: General   Post-op Pain Management:    Induction: Intravenous  PONV Risk Score and Plan: 3 and Ondansetron and Dexamethasone  Airway Management Planned: Oral ETT  Additional Equipment: None  Intra-op Plan:   Post-operative Plan: Extubation in OR  Informed Consent: I have reviewed the patients History and Physical, chart, labs and discussed the procedure including the risks, benefits and alternatives for the proposed anesthesia with the patient or authorized representative who has indicated his/her understanding and acceptance.   Dental advisory given  Plan Discussed with: CRNA and Surgeon  Anesthesia Plan Comments:         Anesthesia Quick Evaluation

## 2018-03-07 NOTE — Telephone Encounter (Signed)
Called the number that was provided and when I called the number, it stated it was for [place]. When listening to the prompts to press a number to reach a certain person, there was no name stated in the prompts for [place].  Attempted to call pt and spoke with her husband Legrand Como. Legrand Como had me speak with a RN as pt was currently admitted in the hospital about to have a procedure performed by Dr. Lamonte Sakai.  While speaking with the RN in regards to the phone call we had received, she stated to me she did not know anyone by the name of Gaye Pollack but stated that she was able to see pt's results in pt's chart in [place]. Nothing further needed.

## 2018-03-07 NOTE — Anesthesia Procedure Notes (Signed)
Procedure Name: Intubation Date/Time: 03/07/2018 11:28 AM Performed by: Barrington Ellison, CRNA Pre-anesthesia Checklist: Patient identified, Emergency Drugs available, Suction available and Patient being monitored Patient Re-evaluated:Patient Re-evaluated prior to induction Oxygen Delivery Method: Circle System Utilized Preoxygenation: Pre-oxygenation with 100% oxygen Induction Type: IV induction Ventilation: Mask ventilation without difficulty Laryngoscope Size: Glidescope and 3 Grade View: Grade I Tube type: Oral Tube size: 8.5 mm Number of attempts: 4 Airway Equipment and Method: Stylet and Oral airway Placement Confirmation: ETT inserted through vocal cords under direct vision,  positive ETCO2 and breath sounds checked- equal and bilateral Secured at: 22 cm Tube secured with: Tape Dental Injury: Teeth and Oropharynx as per pre-operative assessment  Difficulty Due To: Difficulty was unanticipated and Difficult Airway- due to anterior larynx Future Recommendations: Recommend- induction with short-acting agent, and alternative techniques readily available Comments: DL x 3, CRNA DL x 2, once with Mac 3 and no view, DL x 1 with Mil 3,Grade I view but unable to insert ETT, DL x 1 by MDA with Mil 3, Grade 1 view, unable to insert ETT thru chords. Attempt #4 with Glidescope, grade 1 view and ETT inserted easily.

## 2018-03-08 ENCOUNTER — Encounter (HOSPITAL_COMMUNITY): Payer: Self-pay | Admitting: Emergency Medicine

## 2018-03-08 NOTE — Anesthesia Postprocedure Evaluation (Signed)
Anesthesia Post Note  Patient: [name]  Procedure(s) Performed: VIDEO BRONCHOSCOPY WITH ENDOBRONCHIAL NAVIGATION (N/A Bronchus)     Patient location during evaluation: PACU Anesthesia Type: General Level of consciousness: awake and alert Pain management: pain level controlled Vital Signs Assessment: post-procedure vital signs reviewed and stable Respiratory status: spontaneous breathing, nonlabored ventilation, respiratory function stable and patient connected to nasal cannula oxygen Cardiovascular status: blood pressure returned to baseline and stable Postop Assessment: no apparent nausea or vomiting Anesthetic complications: no    Last Vitals:  Vitals:   03/07/18 1325 03/07/18 1420  BP: (!) 155/96 (!) 160/82  Pulse: 91 92  Resp: 16 20  Temp: 36.5 C   SpO2: 94% 100%    Last Pain:  Vitals:   03/07/18 1325  TempSrc:   PainSc: 0-No pain                 [name]

## 2018-03-09 ENCOUNTER — Telehealth: Payer: Self-pay | Admitting: *Deleted

## 2018-03-09 ENCOUNTER — Telehealth: Payer: Self-pay | Admitting: Emergency Medicine

## 2018-03-09 DIAGNOSIS — C3492 Malignant neoplasm of unspecified part of left bronchus or lung: Secondary | ICD-10-CM

## 2018-03-09 NOTE — Telephone Encounter (Signed)
Oncology Nurse Navigator Documentation  Oncology Nurse Navigator Flowsheets 03/09/2018  Navigator Location [facility]  Referral date to RadOnc/MedOnc 03/09/2018  Navigator Encounter Type Telephone/I received referral on Ms. [name] today from Dr. Lamonte Sakai.  I called patient and updated her on appt for [facility] on 03/29/18.  She verbalized understanding of appt time and place.   Telephone Outgoing Call  Treatment Phase Pre-Tx/Tx Discussion  Barriers/Navigation Needs Education;Coordination of Care  Education Other  Interventions Coordination of Care;Education  Coordination of Care Appts  Education Method Verbal  Acuity Level 2  Time Spent with Patient 30

## 2018-03-09 NOTE — Telephone Encounter (Signed)
Reviewed pathology results with the patient and her husband by phone.  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  She needs a staging evaluation and then referral.  She wants to be referred to the multidisciplinary clinic here in [place] and I will arrange for this.  She will have a PET scan and MRI brain either in [place] or in [place] which ever we can most easily arrange prior to her [facility] visit.  She prefers an open MRI scanner if possible and she may know of some open scan devices that are close to her home in [place].

## 2018-03-12 ENCOUNTER — Telehealth: Payer: Self-pay | Admitting: Emergency Medicine

## 2018-03-12 NOTE — Telephone Encounter (Signed)
Called patient unable to reach left message to give us a call back.

## 2018-03-12 NOTE — Telephone Encounter (Signed)
Pt called our office due to having questions about various things. Due to there being another encounter open, closing this mychart message.

## 2018-03-12 NOTE — Telephone Encounter (Signed)
I called the patient and gave her all of the numbers Pet Scan is at [facility] and MRI [facility] [phone] pt is aware

## 2018-03-12 NOTE — Telephone Encounter (Signed)
States is calling [name] back with Fax # for [name] is [phone] for biopsy report.  Patient's CB is [phone].

## 2018-03-12 NOTE — Telephone Encounter (Signed)
Called and spoke with pt who states she was told by Ria Comment to call the office if she had any questions.  Pt called and stated she had a bronchoscopy performed and was told the results of the bronchoscopy but she cannot remember all of the information that was stated to her by Dr. Lamonte Sakai.  I did see the phone note in pt's chart from 03/09/18 where RB called and spoke with both pt and her husband in regards to the results.  Pt state she needs the results of the bronchoscopy to be sent to her PCP Dr. Oran Rein. I have located Dr. Oran Rein' information information in pt's chart and stated to her that I would fax pt's report to Dr. Oran Rein for her. Pt was requesting a copy of the report for her records. Pt stated she would call our office with a fax number so we could fax the results of the bronchoscopy to her.  Pt also stated to me that she had questions in regards to what the phone number is of the place where the upcoming scans she is having performed is as pt states these scans were not scheduled by our office and she wants to know if we could help her out with this.  PCCs, please advise on this for pt.  Also while speaking with pt, she stated that she was told by RB that she was suceptible to come down with something and she wanted to know if he had meant just from the time she had the bronchoscopy performed or if he meant long-term and if it would be okay for her to go out for Thanksgiving or go to the stores (ie grocery store). Dr. Lamonte Sakai, please advise on this for pt. Thanks!

## 2018-03-13 ENCOUNTER — Telehealth: Payer: Self-pay | Admitting: Emergency Medicine

## 2018-03-13 NOTE — Telephone Encounter (Signed)
Called and spoke with pt to verify the fax number. Stated to pt that I would take care of faxing the report to her so she could have for her records. Pt asked me if I had sent the report to Dr. Oran Rein for her and I stated to pt I did.  Report has been faxed to pt. Nothing further needed.

## 2018-03-13 NOTE — Telephone Encounter (Signed)
FYI- fax confirmation received

## 2018-03-13 NOTE — Telephone Encounter (Signed)
Spoke with the pt  She requests that her path report be mailed to her  I have verified her address and placed in outgoing mail  I faxed copy to Dr Oran Rein [phone]  Nothing further needed

## 2018-03-16 ENCOUNTER — Encounter: Payer: Self-pay | Admitting: *Deleted

## 2018-03-16 NOTE — Progress Notes (Signed)
Oncology Nurse Navigator Documentation  Oncology Nurse Navigator Flowsheets 03/16/2018  Navigator Location [facility]  Navigator Encounter Type Letter/Fax/Email;Diagnostic Results/per patient's request, I sent her a secure email regarding her appt for [facility] on 03/29/18.    Treatment Phase Pre-Tx/Tx Discussion  Barriers/Navigation Needs Education  Education Other  Interventions Education  Education Method Written  Acuity Level 1  Time Spent with Patient 15

## 2018-03-21 ENCOUNTER — Ambulatory Visit (HOSPITAL_COMMUNITY)
Admission: RE | Admit: 2018-03-21 | Discharge: 2018-03-21 | Disposition: A | Discharge status: Home / Self Care | Payer: Medicare Other | Source: Ambulatory Visit | Attending: Emergency Medicine | Admitting: Emergency Medicine

## 2018-03-21 DIAGNOSIS — C3492 Malignant neoplasm of unspecified part of left bronchus or lung: Secondary | ICD-10-CM | POA: Diagnosis present

## 2018-03-21 LAB — GLUCOSE, CAPILLARY: Glucose-Capillary: 116 mg/dL — ABNORMAL HIGH (ref 70–99)

## 2018-03-21 MED ORDER — FLUDEOXYGLUCOSE F - 18 (FDG) INJECTION
7.3000 | Freq: Once | INTRAVENOUS | Status: AC
  Administered 2018-03-21: 7.3 via INTRAVENOUS

## 2018-03-22 ENCOUNTER — Telehealth: Payer: Self-pay | Admitting: *Deleted

## 2018-03-22 NOTE — Telephone Encounter (Signed)
Oncology Nurse Navigator Documentation  Oncology Nurse Navigator Flowsheets 03/22/2018  Navigator Location [facility]  Navigator Encounter Type Telephone/I called patient and updated her from cancer conference. She verbalized understanding and is set up for [place] next week.  She was thankful for the update.   Telephone Outgoing Call  Treatment Phase Pre-Tx/Tx Discussion  Barriers/Navigation Needs Education  Education Other  Interventions Education  Education Method Verbal  Acuity Level 1  Time Spent with Patient 30

## 2018-03-23 ENCOUNTER — Other Ambulatory Visit: Payer: Self-pay | Admitting: *Deleted

## 2018-03-23 NOTE — Progress Notes (Signed)
Patient's case discussed at thoracic cancer conference 03/22/18 and documented for communication purpose.  Patient was not seen or examine at cancer conference.

## 2018-03-24 ENCOUNTER — Ambulatory Visit
Admission: RE | Admit: 2018-03-24 | Discharge: 2018-03-24 | Disposition: A | Discharge status: Home / Self Care | Payer: Medicare Other | Source: Ambulatory Visit | Attending: Emergency Medicine | Admitting: Emergency Medicine

## 2018-03-24 DIAGNOSIS — C3492 Malignant neoplasm of unspecified part of left bronchus or lung: Secondary | ICD-10-CM

## 2018-03-24 MED ORDER — GADOBENATE DIMEGLUMINE 529 MG/ML IV SOLN
13.0000 mL | Freq: Once | INTRAVENOUS | Status: AC | PRN
  Administered 2018-03-24: 13 mL via INTRAVENOUS

## 2018-03-29 ENCOUNTER — Institutional Professional Consult (permissible substitution) (INDEPENDENT_AMBULATORY_CARE_PROVIDER_SITE_OTHER): Payer: Medicare Other | Admitting: Thoracic Surgery (Cardiothoracic Vascular Surgery)

## 2018-03-29 ENCOUNTER — Encounter: Payer: Self-pay | Admitting: Thoracic Surgery (Cardiothoracic Vascular Surgery)

## 2018-03-29 VITALS — BP 183/88 | HR 98 | Temp 98.6°F | Resp 18 | Wt 148.2 lb

## 2018-03-29 DIAGNOSIS — C3492 Malignant neoplasm of unspecified part of left bronchus or lung: Secondary | ICD-10-CM | POA: Diagnosis not present

## 2018-03-29 NOTE — H&P (View-Only) (Signed)
PCP is Meeks, [name], MD Referring Provider is Donald Prose, MD  No chief complaint on file.   HPI: 67 yo woman sent for consultation re: adenocarcinoma left lower lobe.  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 She is a lifelong nonsmoker. She has been having small amounts of hemoptysis for almost 6 months. She had an ENT evaluation that was negative. More recently in October she had an increase in her cough and more hemoptysis, although still relatively small amounts. She had further evaluation including a chest CT which showed a left lower lobe lung mass. On PET the mass was hypermetabolic, there was no regional or distant metastatic disease. Dr. Alfred Levins is a relative of her husband. She was referred to Dr. Lamonte Sakai. He did a navigational bronchoscopy which revealed adenocarcinoma.   She denies wheezing or shortness of breath with routine activities. She has occasional migraines. No chest pain, pressure or tightness at rest or with exertion. She denies change in appetite or significant weight loss. Very anxious about diagnosis and treatment.  Zubrod Score: At the time of surgery this patient's most appropriate activity status/level should be described as: [x]     0    Normal activity, no symptoms []     1    Restricted in physical strenuous activity but ambulatory, able to do out light work []     2    Ambulatory and capable of self care, unable to do work activities, up and about >50 % of waking hours                              []     3    Only limited self care, in bed greater than 50% of waking hours []     4    Completely disabled, no self care, confined to bed or chair []     5    Moribund    Past Medical History:  Diagnosis Date  . Anemia   . Atherosclerosis   . GERD (gastroesophageal reflux disease)   . Hemoptysis   . Hyperlipidemia   .  Hypertension   . IBS (irritable bowel syndrome)   . Lung mass   . Migraines   . Spondylosis   . Vitamin D deficiency     Past Surgical History:  Procedure Laterality Date  . BROW LIFT AND BLEPHAROPLASTY  03/15/2013  . CATARACT EXTRACTION, BILATERAL  06/23/2014  . COLONOSCOPY  05/2014  . ESOPHAGOGASTRODUODENOSCOPY  2019  . EYE SURGERY    . TONSILLECTOMY    . VIDEO BRONCHOSCOPY WITH ENDOBRONCHIAL NAVIGATION N/A 03/07/2018   Procedure: VIDEO BRONCHOSCOPY WITH ENDOBRONCHIAL NAVIGATION;  Surgeon: Collene Gobble, MD;  Location: [facility] OR;  Service: Thoracic;  Laterality: N/A;    Family History  Problem Relation Age of Onset  . Dementia Mother   . Hyperlipidemia Mother   . Hypertension Mother   . Thyroid disease Mother   . Irritable bowel syndrome Mother   . Aortic stenosis Mother   . Cancer Father   . Lung cancer Sister     Social History Social History   Tobacco Use  . Smoking status: Never Smoker  . Smokeless tobacco: Never Used  Substance Use Topics  . Alcohol use: Yes    Alcohol/week: 4.0 standard drinks    Types: 4 Glasses of wine per week  Comment: a week  . Drug use: Never    Current Outpatient Medications  Medication Sig Dispense Refill  . beta carotene 25000 UNIT capsule Take 25,000 Units by mouth every other day.    . Cholecalciferol (VITAMIN D3) 50 MCG (2000 UT) TABS Take 4,000 Units by mouth daily.    . Coenzyme Q10 (COQ10) 200 MG CAPS Take 200 mg by mouth daily.    Marland Kitchen EPINEPHrine (EPIPEN 2-PAK) 0.3 mg/0.3 mL IJ SOAJ injection Inject 0.3 mg into the muscle once.    . Levomefolate Glucosamine (METHYLFOLATE PO) Place 800 mcg under the tongue every other day.    . Multiple Vitamins-Minerals (EMERGEN-C IMMUNE PO) Take 1 packet by mouth daily as needed (for immune system).    . NONFORMULARY OR COMPOUNDED ITEM Apply 3 mLs topically at bedtime. Biest 50:50 1mg  Testosterone 1 mg    . NONFORMULARY OR COMPOUNDED ITEM Place 150 mg inside cheek at bedtime. Progesterone  150 mg [place]    . Olopatadine HCl (PATANASE) 0.6 % SOLN Place 1-2 sprays into the nose 2 (two) times daily as needed (for congestion).     . Omega-3 1000 MG CAPS Take 2,000 mg by mouth daily.     . Probiotic Product (PROBIOTIC PO) Take 1 capsule by mouth daily.    . Riboflavin 100 MG CAPS Take 200 mg by mouth daily.    . rizatriptan (MAXALT) 10 MG tablet Take 5 mg by mouth as needed for migraine. May repeat in 2 hours if needed     . rosuvastatin (CRESTOR) 5 MG tablet Take 5 mg by mouth daily.    . sucralfate (CARAFATE) 1 g tablet Take 1 g by mouth See admin instructions. Crush and drink 1 g by mouth 2-3 times daily    . vitamin A 8000 UNIT capsule Take 8,000 Units by mouth every other day.    . vitamin B-12 (CYANOCOBALAMIN) 1000 MCG tablet Take 1,000 mcg by mouth every other day.     No current facility-administered medications for this visit.     Allergies  Allergen Reactions  . Proton Pump Inhibitors Anaphylaxis  . Sulfites Anaphylaxis, Swelling and Other (See Comments)    Turn red and throat swells  . Wasp Venom Protein Swelling  . Aspartame Other (See Comments)    Severe diarrhea  . Avocado Other (See Comments)    Chest tightness, nauseous  . Cefaclor Hives  . Chlorhexidine Other (See Comments)    Oral solution, dizziness occured  . Chloroxine Other (See Comments)    Severe migraine, chemical cleaning products  . Chocolate Other (See Comments)    Acne  . Ciprofloxacin Hcl Hives  . Clarithromycin Hives  . Gatifloxacin Hives  . Influenza Vaccines Other (See Comments)    Couldn't breathe  . Ivp Dye [Iodinated Diagnostic Agents] Other (See Comments)    Acne  . Ketorolac Other (See Comments)    Extreme dizziness and fatigue  . Lactase Other (See Comments)    Unknown  . Molds & Smuts Other (See Comments)    Unknown  . Nexium [Esomeprazole Magnesium] Other (See Comments)    Throat spasms  . Other Other (See Comments)    Grass and Trees, Maple, [place], [place], Vineyard mix, [place],  [place], Rag weed, [place], Pollen, Dog and cats, Technical sales engineer and Down. ALL ANTIBIOTICS CAUSE HIVES EXCEPT Z-PAKS  . Penicillins Hives  . Pork-Derived Products Other (See Comments)    religious  . Prednisolone Other (See Comments)    Extreme dizziness and fatigue  .  Shellfish Allergy Other (See Comments)    Acne  . Soap Other (See Comments)    Soft soap- severe dermatitis   Soft Soap  Brand name  . Sulfa Antibiotics Hives  . Sulfamethoxazole-Trimethoprim Hives  . Zantac [Ranitidine Hcl] Other (See Comments)    Severe throat spasms    Review of Systems  Constitutional: Negative for unexpected weight change.  HENT: Negative for trouble swallowing and voice change.   Eyes: Negative for visual disturbance.  Respiratory: Positive for cough (hemoptysis). Negative for shortness of breath and wheezing.   Gastrointestinal: Negative for abdominal distention and abdominal pain.  Genitourinary: Negative for difficulty urinating and dysuria.  Musculoskeletal: Positive for arthralgias (left rotator cuff) and back pain.  Neurological: Positive for headaches (migraines). Negative for seizures, syncope and weakness.  Hematological: Negative for adenopathy. Does not bruise/bleed easily.  Psychiatric/Behavioral: The patient is nervous/anxious.     BP (!) 183/88   Pulse 98   Temp 98.6 F (37 C)   Resp 18   Wt 148 lb 3.2 oz (67.2 kg)   SpO2 98%   BMI 24.85 kg/m  Physical Exam   Diagnostic Tests: CT CHEST WITHOUT CONTRAST  TECHNIQUE: Multidetector CT imaging of the chest was performed using thin slice collimation for electromagnetic bronchoscopy planning purposes, without intravenous contrast.  COMPARISON:  Outside CT scan from [facility] regional imaging dated 02/23/2018  FINDINGS: Cardiovascular: The heart is normal in size. No pericardial effusion. The aorta is normal in caliber. Scattered atherosclerotic calcifications. Scattered coronary artery calcifications also.  Some calcifications are also noted at the aortic root.  Mediastinum/Nodes: No mediastinal or hilar lymphadenopathy. The esophagus is normal.  Lungs/Pleura: There is a rounded airspace opacity in the posterior aspect of the left upper lobe adjacent to the descending thoracic aorta. On the sagittal reformatted images it appears it may be trans fissural. This is unchanged since the prior outside CT scan. Measures approximately 2.4 x 2.3 cm. It contains air bronchograms and small surrounding bulla. It could be an area of rounded atelectasis. Tumor can't be excluded.  No other pulmonary lesions are identified. No interstitial lung disease or bronchiectasis. No pleural effusions or pleural lesions.  Upper Abdomen: No significant upper abdominal findings. Moderate atherosclerotic calcifications involving the upper abdominal aorta. No hepatic or adrenal gland lesions are identified.  Musculoskeletal: No chest wall masses, breast masses, supraclavicular or axillary adenopathy.  The bony thorax is intact.  No worrisome bone lesions.  IMPRESSION: 1. 2.4 x 2.3 cm airspace opacity in the left upper lobe with a possible trans fissural component in the left lower lobe. This could be an area of chronic inflammation or infection but tumor cannot be excluded. 2. No mediastinal or hilar mass or adenopathy. 3. No other significant pulmonary findings. 4. Moderate scattered atherosclerotic calcifications involving thoracic and abdominal aorta and coronary arteries.  Aortic Atherosclerosis (ICD10-I70.0).   Electronically Signed   By: Marijo Sanes M.D.   On: 03/02/2018 08:00 NUCLEAR MEDICINE PET SKULL BASE TO THIGH  TECHNIQUE: 7.3 mCi F-18 FDG was injected intravenously. Full-ring PET imaging was performed from the skull base to thigh after the radiotracer. CT data was obtained and used for attenuation correction and anatomic localization.  Fasting blood glucose: 116  mg/dl  COMPARISON:  Chest CT 03/01/2018  FINDINGS: Mediastinal blood pool activity: SUV max 2.78  NECK: No hypermetabolic lymph nodes in the neck.  Incidental note is made of areas of muscular uptake and also brown fat uptake in the supraclavicular fossa regions.  Incidental  CT findings: none  CHEST: 2.5 cm left lung lesion is hypermetabolic with SUV max of 4.97. No enlarged or hypermetabolic mediastinal or hilar lymph nodes to suggest metastatic adenopathy. No other lung lesions are identified.  Incidental CT findings: Scattered coronary artery calcifications. Small hiatal hernia.  ABDOMEN/PELVIS: No abnormal hypermetabolic activity within the liver, pancreas, adrenal glands, or spleen. No hypermetabolic lymph nodes in the abdomen or pelvis.  Incidental CT findings: none  SKELETON: No focal hypermetabolic activity to suggest skeletal metastasis. Small focus of uptake noted along the anterior margin of the right femoral neck which is likely inflammatory process or synovitis.  Uptake noted along the intercostal muscles posteriorly in the upper thoracic region.  Incidental CT findings: none  IMPRESSION: 1. 2.5 cm transfissural left upper lobe/left lower lobe lesion is hypermetabolic and consistent with neoplasm. 2. No PET-CT findings for mediastinal/hilar adenopathy or metastatic disease elsewhere. 3. Incidental brown fat and muscular uptake.   Electronically Signed   By: Marijo Sanes M.D.   On: 03/21/2018 15:49 I personally reviewed the CT and PET images and concur with the findings noted above  MR brain- normal  Impression: [name] is a 67 yo nonsmoker who presents with a 6 month history of small amounts of hemoptysis. Workup revealed a 2.5 cm left lower lobe lung nodule. Biopsy showed adenocarcinoma. Clinical stage T1,N0- IA.  We discussed potential treatment with surgery v radiation. Best chance of cure is with left lower lobectomy.  I  discussed the general nature of the procedure, the need for general anesthesia, the incisions to be used and the use of a drainage tube postoperatively with Mrs Hettinger and her husband. We discussed the expected hospital stay, overall recovery and short and long term outcomes. They understand the risks include, but are not limited to death, stroke, MI, DVT/PE, bleeding, possible need for transfusion, infections, prolonged air leak, cardiac arrhythmias, as well as other organ system dysfunction including renal or GI complications.   She accepts the risks and agrees to proceed.  She needs PFTs, although I do not think there will be any issue with her pulmonary reserve.  She wants to wait until after the holidays to have surgery.  Plan: PFTs- will try to do next Thursday 12/19 when she sees Dr. Lamonte Sakai Left VATS, left lower lobectomy. Will call patient to schedule.   [name], MD [facility] [phone]

## 2018-03-29 NOTE — Progress Notes (Signed)
PCP is Meeks, [name], MD Referring Provider is Donald Prose, MD  No chief complaint on file.   HPI: 67 yo woman sent for consultation re: adenocarcinoma left lower lobe.  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 She is a lifelong nonsmoker. She has been having small amounts of hemoptysis for almost 6 months. She had an ENT evaluation that was negative. More recently in October she had an increase in her cough and more hemoptysis, although still relatively small amounts. She had further evaluation including a chest CT which showed a left lower lobe lung mass. On PET the mass was hypermetabolic, there was no regional or distant metastatic disease. Dr. Alfred Levins is a relative of her husband. She was referred to Dr. Lamonte Sakai. He did a navigational bronchoscopy which revealed adenocarcinoma.   She denies wheezing or shortness of breath with routine activities. She has occasional migraines. No chest pain, pressure or tightness at rest or with exertion. She denies change in appetite or significant weight loss. Very anxious about diagnosis and treatment.  Zubrod Score: At the time of surgery this patient's most appropriate activity status/level should be described as: [x]     0    Normal activity, no symptoms []     1    Restricted in physical strenuous activity but ambulatory, able to do out light work []     2    Ambulatory and capable of self care, unable to do work activities, up and about >50 % of waking hours                              []     3    Only limited self care, in bed greater than 50% of waking hours []     4    Completely disabled, no self care, confined to bed or chair []     5    Moribund    Past Medical History:  Diagnosis Date  . Anemia   . Atherosclerosis   . GERD (gastroesophageal reflux disease)   . Hemoptysis   . Hyperlipidemia   .  Hypertension   . IBS (irritable bowel syndrome)   . Lung mass   . Migraines   . Spondylosis   . Vitamin D deficiency     Past Surgical History:  Procedure Laterality Date  . BROW LIFT AND BLEPHAROPLASTY  03/15/2013  . CATARACT EXTRACTION, BILATERAL  06/23/2014  . COLONOSCOPY  05/2014  . ESOPHAGOGASTRODUODENOSCOPY  2019  . EYE SURGERY    . TONSILLECTOMY    . VIDEO BRONCHOSCOPY WITH ENDOBRONCHIAL NAVIGATION N/A 03/07/2018   Procedure: VIDEO BRONCHOSCOPY WITH ENDOBRONCHIAL NAVIGATION;  Surgeon: Collene Gobble, MD;  Location: [facility] OR;  Service: Thoracic;  Laterality: N/A;    Family History  Problem Relation Age of Onset  . Dementia Mother   . Hyperlipidemia Mother   . Hypertension Mother   . Thyroid disease Mother   . Irritable bowel syndrome Mother   . Aortic stenosis Mother   . Cancer Father   . Lung cancer Sister     Social History Social History   Tobacco Use  . Smoking status: Never Smoker  . Smokeless tobacco: Never Used  Substance Use Topics  . Alcohol use: Yes    Alcohol/week: 4.0 standard drinks    Types: 4 Glasses of wine per week  Comment: a week  . Drug use: Never    Current Outpatient Medications  Medication Sig Dispense Refill  . beta carotene 25000 UNIT capsule Take 25,000 Units by mouth every other day.    . Cholecalciferol (VITAMIN D3) 50 MCG (2000 UT) TABS Take 4,000 Units by mouth daily.    . Coenzyme Q10 (COQ10) 200 MG CAPS Take 200 mg by mouth daily.    Marland Kitchen EPINEPHrine (EPIPEN 2-PAK) 0.3 mg/0.3 mL IJ SOAJ injection Inject 0.3 mg into the muscle once.    . Levomefolate Glucosamine (METHYLFOLATE PO) Place 800 mcg under the tongue every other day.    . Multiple Vitamins-Minerals (EMERGEN-C IMMUNE PO) Take 1 packet by mouth daily as needed (for immune system).    . NONFORMULARY OR COMPOUNDED ITEM Apply 3 mLs topically at bedtime. Biest 50:50 1mg  Testosterone 1 mg    . NONFORMULARY OR COMPOUNDED ITEM Place 150 mg inside cheek at bedtime. Progesterone  150 mg [place]    . Olopatadine HCl (PATANASE) 0.6 % SOLN Place 1-2 sprays into the nose 2 (two) times daily as needed (for congestion).     . Omega-3 1000 MG CAPS Take 2,000 mg by mouth daily.     . Probiotic Product (PROBIOTIC PO) Take 1 capsule by mouth daily.    . Riboflavin 100 MG CAPS Take 200 mg by mouth daily.    . rizatriptan (MAXALT) 10 MG tablet Take 5 mg by mouth as needed for migraine. May repeat in 2 hours if needed     . rosuvastatin (CRESTOR) 5 MG tablet Take 5 mg by mouth daily.    . sucralfate (CARAFATE) 1 g tablet Take 1 g by mouth See admin instructions. Crush and drink 1 g by mouth 2-3 times daily    . vitamin A 8000 UNIT capsule Take 8,000 Units by mouth every other day.    . vitamin B-12 (CYANOCOBALAMIN) 1000 MCG tablet Take 1,000 mcg by mouth every other day.     No current facility-administered medications for this visit.     Allergies  Allergen Reactions  . Proton Pump Inhibitors Anaphylaxis  . Sulfites Anaphylaxis, Swelling and Other (See Comments)    Turn red and throat swells  . Wasp Venom Protein Swelling  . Aspartame Other (See Comments)    Severe diarrhea  . Avocado Other (See Comments)    Chest tightness, nauseous  . Cefaclor Hives  . Chlorhexidine Other (See Comments)    Oral solution, dizziness occured  . Chloroxine Other (See Comments)    Severe migraine, chemical cleaning products  . Chocolate Other (See Comments)    Acne  . Ciprofloxacin Hcl Hives  . Clarithromycin Hives  . Gatifloxacin Hives  . Influenza Vaccines Other (See Comments)    Couldn't breathe  . Ivp Dye [Iodinated Diagnostic Agents] Other (See Comments)    Acne  . Ketorolac Other (See Comments)    Extreme dizziness and fatigue  . Lactase Other (See Comments)    Unknown  . Molds & Smuts Other (See Comments)    Unknown  . Nexium [Esomeprazole Magnesium] Other (See Comments)    Throat spasms  . Other Other (See Comments)    Grass and Trees, Maple, [place], [place], [place] mix, [place],  Mutual, Rag weed, [place], Pollen, Dog and cats, Technical sales engineer and Down. ALL ANTIBIOTICS CAUSE HIVES EXCEPT Z-PAKS  . Penicillins Hives  . Pork-Derived Products Other (See Comments)    religious  . Prednisolone Other (See Comments)    Extreme dizziness and fatigue  .  Shellfish Allergy Other (See Comments)    Acne  . Soap Other (See Comments)    Soft soap- severe dermatitis   Soft Soap  Brand name  . Sulfa Antibiotics Hives  . Sulfamethoxazole-Trimethoprim Hives  . Zantac [Ranitidine Hcl] Other (See Comments)    Severe throat spasms    Review of Systems  Constitutional: Negative for unexpected weight change.  HENT: Negative for trouble swallowing and voice change.   Eyes: Negative for visual disturbance.  Respiratory: Positive for cough (hemoptysis). Negative for shortness of breath and wheezing.   Gastrointestinal: Negative for abdominal distention and abdominal pain.  Genitourinary: Negative for difficulty urinating and dysuria.  Musculoskeletal: Positive for arthralgias (left rotator cuff) and back pain.  Neurological: Positive for headaches (migraines). Negative for seizures, syncope and weakness.  Hematological: Negative for adenopathy. Does not bruise/bleed easily.  Psychiatric/Behavioral: The patient is nervous/anxious.     BP (!) 183/88   Pulse 98   Temp 98.6 F (37 C)   Resp 18   Wt 148 lb 3.2 oz (67.2 kg)   SpO2 98%   BMI 24.85 kg/m  Physical Exam   Diagnostic Tests: CT CHEST WITHOUT CONTRAST  TECHNIQUE: Multidetector CT imaging of the chest was performed using thin slice collimation for electromagnetic bronchoscopy planning purposes, without intravenous contrast.  COMPARISON:  Outside CT scan from [facility] dated 02/23/2018  FINDINGS: Cardiovascular: The heart is normal in size. No pericardial effusion. The aorta is normal in caliber. Scattered atherosclerotic calcifications. Scattered coronary artery calcifications also.  Some calcifications are also noted at the aortic root.  Mediastinum/Nodes: No mediastinal or hilar lymphadenopathy. The esophagus is normal.  Lungs/Pleura: There is a rounded airspace opacity in the posterior aspect of the left upper lobe adjacent to the descending thoracic aorta. On the sagittal reformatted images it appears it may be trans fissural. This is unchanged since the prior outside CT scan. Measures approximately 2.4 x 2.3 cm. It contains air bronchograms and small surrounding bulla. It could be an area of rounded atelectasis. Tumor can't be excluded.  No other pulmonary lesions are identified. No interstitial lung disease or bronchiectasis. No pleural effusions or pleural lesions.  Upper Abdomen: No significant upper abdominal findings. Moderate atherosclerotic calcifications involving the upper abdominal aorta. No hepatic or adrenal gland lesions are identified.  Musculoskeletal: No chest wall masses, breast masses, supraclavicular or axillary adenopathy.  The bony thorax is intact.  No worrisome bone lesions.  IMPRESSION: 1. 2.4 x 2.3 cm airspace opacity in the left upper lobe with a possible trans fissural component in the left lower lobe. This could be an area of chronic inflammation or infection but tumor cannot be excluded. 2. No mediastinal or hilar mass or adenopathy. 3. No other significant pulmonary findings. 4. Moderate scattered atherosclerotic calcifications involving thoracic and abdominal aorta and coronary arteries.  Aortic Atherosclerosis (ICD10-I70.0).   Electronically Signed   By: Marijo Sanes M.D.   On: 03/02/2018 08:00 NUCLEAR MEDICINE PET SKULL BASE TO THIGH  TECHNIQUE: 7.3 mCi F-18 FDG was injected intravenously. Full-ring PET imaging was performed from the skull base to thigh after the radiotracer. CT data was obtained and used for attenuation correction and anatomic localization.  Fasting blood glucose: 116  mg/dl  COMPARISON:  Chest CT 03/01/2018  FINDINGS: Mediastinal blood pool activity: SUV max 2.78  NECK: No hypermetabolic lymph nodes in the neck.  Incidental note is made of areas of muscular uptake and also brown fat uptake in the supraclavicular fossa regions.  Incidental  CT findings: none  CHEST: 2.5 cm left lung lesion is hypermetabolic with SUV max of 8.34. No enlarged or hypermetabolic mediastinal or hilar lymph nodes to suggest metastatic adenopathy. No other lung lesions are identified.  Incidental CT findings: Scattered coronary artery calcifications. Small hiatal hernia.  ABDOMEN/PELVIS: No abnormal hypermetabolic activity within the liver, pancreas, adrenal glands, or spleen. No hypermetabolic lymph nodes in the abdomen or pelvis.  Incidental CT findings: none  SKELETON: No focal hypermetabolic activity to suggest skeletal metastasis. Small focus of uptake noted along the anterior margin of the right femoral neck which is likely inflammatory process or synovitis.  Uptake noted along the intercostal muscles posteriorly in the upper thoracic region.  Incidental CT findings: none  IMPRESSION: 1. 2.5 cm transfissural left upper lobe/left lower lobe lesion is hypermetabolic and consistent with neoplasm. 2. No PET-CT findings for mediastinal/hilar adenopathy or metastatic disease elsewhere. 3. Incidental brown fat and muscular uptake.   Electronically Signed   By: Marijo Sanes M.D.   On: 03/21/2018 15:49 I personally reviewed the CT and PET images and concur with the findings noted above  MR brain- normal  Impression: Shaneese Tait is a 67 yo nonsmoker who presents with a 6 month history of small amounts of hemoptysis. Workup revealed a 2.5 cm left lower lobe lung nodule. Biopsy showed adenocarcinoma. Clinical stage T1,N0- IA.  We discussed potential treatment with surgery v radiation. Best chance of cure is with left lower lobectomy.  I  discussed the general nature of the procedure, the need for general anesthesia, the incisions to be used and the use of a drainage tube postoperatively with Mrs Bihl and her husband. We discussed the expected hospital stay, overall recovery and short and long term outcomes. They understand the risks include, but are not limited to death, stroke, MI, DVT/PE, bleeding, possible need for transfusion, infections, prolonged air leak, cardiac arrhythmias, as well as other organ system dysfunction including renal or GI complications.   She accepts the risks and agrees to proceed.  She needs PFTs, although I do not think there will be any issue with her pulmonary reserve.  She wants to wait until after the holidays to have surgery.  Plan: PFTs- will try to do next Thursday 12/19 when she sees Dr. Lamonte Sakai Left VATS, left lower lobectomy. Will call patient to schedule.   [name], MD [facility] [phone]

## 2018-03-30 ENCOUNTER — Encounter: Payer: Self-pay | Admitting: *Deleted

## 2018-03-30 ENCOUNTER — Other Ambulatory Visit: Payer: Self-pay | Admitting: *Deleted

## 2018-03-30 ENCOUNTER — Telehealth: Payer: Self-pay | Admitting: Emergency Medicine

## 2018-03-30 DIAGNOSIS — R918 Other nonspecific abnormal finding of lung field: Secondary | ICD-10-CM

## 2018-03-30 DIAGNOSIS — C3432 Malignant neoplasm of lower lobe, left bronchus or lung: Secondary | ICD-10-CM

## 2018-03-30 DIAGNOSIS — Z01811 Encounter for preprocedural respiratory examination: Secondary | ICD-10-CM

## 2018-03-30 NOTE — Telephone Encounter (Signed)
Called and spoke with Jana Half, she stated that Dr. Roxan Hockey wanted the patient to have a PFT done on the same day she was to see RB due to having surgery at the beginning of the year. Advised to her that both PFT rooms are full that day but we could get her in another day. Jana Half stated that patient lived too far away to come in separate days. She stated that she would talk to the patient and call us back.

## 2018-04-02 ENCOUNTER — Telehealth: Payer: Self-pay | Admitting: Emergency Medicine

## 2018-04-02 NOTE — Telephone Encounter (Signed)
Called and spoke with patient, she stated that she is still having a cough in the mornings and at night, she is coughing up yellow mucus. No other symptoms. She recently had a bronch done 03/07/18. She is wanting to know what to do about this. She has an appointment on 04/05/18 with RB but does not want to come in sooner since she lives in [place]. She also has a PFT at [facility] before her appointment that day.   SG please advise thank you.

## 2018-04-02 NOTE — Telephone Encounter (Signed)
She states she is in [place] and does not want to come in before her appt on 12/19.

## 2018-04-03 MED ORDER — AZITHROMYCIN 250 MG PO TABS: ORAL_TABLET | ORAL | 0 refills | Status: DC

## 2018-04-03 MED ORDER — DOXYCYCLINE HYCLATE 100 MG PO TABS: 100.0000 mg | ORAL_TABLET | Freq: Two times a day (BID) | ORAL | 0 refills | Status: DC

## 2018-04-03 NOTE — Telephone Encounter (Signed)
Based on her description of the sx and the duration, I think it would be reasonable to treat her for a possible bronchitis. Please ask her to take doxycycline 100mg  bid x 7 d. She may also benefit from symptomatic OTC meds - like tylenol cold & flu, etc. If she feels that she needs a cough suppressant as well, offer her tessalon perles 200mg  q6h prn for cough.

## 2018-04-03 NOTE — Telephone Encounter (Signed)
Rx of doxy has been sent to [name]'s preferred pharmacy.  Called and spoke with [name] letting her know this had been done. After speaking with [name] letting her know the abx of doxy had been called in for her, [name] wanted to know if there was any way the abx could be switched from doxy to zpak for her as she became a little hesitant with doing doxy due to all the different meds she is allergic to and she stated she has taken zpak before and did fine with that.  [name] also stated to me she is unsure if she will be able to complete PFT that is scheduled at [facility] prior to [place] with RB 12/19. [name] wants to know if she can just come to [facility] and hold off of PFT for now or if RB does need to have her try to do the PFT.  Dr. Lamonte Sakai, please advise on this for [name]. Thanks!

## 2018-04-03 NOTE — Telephone Encounter (Signed)
Patient returned phone call; pt contact # [phone]

## 2018-04-03 NOTE — Telephone Encounter (Signed)
Attempted to call pt but unable to reach her.left message for pt to return call. I have pended the doxy Rx but once pt returns call, and after pharmacy is reverified with pt, we can send the Rx in and also discuss with her all other recs per Kamiah.

## 2018-04-03 NOTE — Telephone Encounter (Signed)
Called spoke with patient and discussed Dr Agustina Caroli recommendations as stated below. Patient voiced her understanding. Called [facility] in [place] and spoke with pharmacist Arianna - doxycycline cancelled and verbal order given for the zpak 250mg  #6, take as directed, no refills. Med list updated.

## 2018-04-03 NOTE — Telephone Encounter (Signed)
It is okay with me for her to change the doxycycline to azithromycin if she has tolerated that medication better.  Please give her a Z-Pak  I really want her to go ahead and get the pulmonary function testing because she is going to need for Dr. Roxan Hockey to review before she has lung surgery.

## 2018-04-05 ENCOUNTER — Ambulatory Visit (HOSPITAL_COMMUNITY)
Admission: RE | Admit: 2018-04-05 | Discharge: 2018-04-05 | Disposition: A | Discharge status: Home / Self Care | Payer: Medicare Other | Source: Ambulatory Visit | Attending: Thoracic Surgery (Cardiothoracic Vascular Surgery) | Admitting: Thoracic Surgery (Cardiothoracic Vascular Surgery)

## 2018-04-05 ENCOUNTER — Encounter: Payer: Self-pay | Admitting: Emergency Medicine

## 2018-04-05 ENCOUNTER — Ambulatory Visit (INDEPENDENT_AMBULATORY_CARE_PROVIDER_SITE_OTHER): Payer: Medicare Other | Admitting: Emergency Medicine

## 2018-04-05 DIAGNOSIS — C3492 Malignant neoplasm of unspecified part of left bronchus or lung: Secondary | ICD-10-CM | POA: Diagnosis not present

## 2018-04-05 DIAGNOSIS — C3432 Malignant neoplasm of lower lobe, left bronchus or lung: Secondary | ICD-10-CM | POA: Insufficient documentation

## 2018-04-05 DIAGNOSIS — J209 Acute bronchitis, unspecified: Secondary | ICD-10-CM | POA: Diagnosis not present

## 2018-04-05 NOTE — Patient Instructions (Signed)
Agree with current plans for surgical resection with Dr. Roxan Hockey. Pulmonary function testing prior to your procedure so that Dr. Roxan Hockey can review. Please call our office or follow-up with Dr. Lamonte Sakai for any problems with your breathing postoperatively.

## 2018-04-05 NOTE — Assessment & Plan Note (Signed)
Improving with azithromycin.  She has completed this.  No new intervention at this time

## 2018-04-05 NOTE — Assessment & Plan Note (Signed)
Planning for VATS and lobectomy with Dr. Roxan Hockey in early January.  She needs to have her PFT, these were postponed because she developed a bronchitis.  She can follow with me if any issues with her breathing postoperatively or if any evidence of obstruction on her PFT.

## 2018-04-05 NOTE — Progress Notes (Signed)
Subjective:    Patient ID: [name], female    DOB: July 15, 1950, 67 y.o.   MRN: [id number]  HPI 67 year old never smoker with a history of hypertension, IBS, hyperlipidemia, GERD, allergies.  She is referred today for evaluation of an abnormal CT scan of her chest.   She noted some streaks of blood in her mucus about 6 months ago.  She had an ENT eval that was unremarkable. The amout progressed since September after an apparent URI.  This prompted a CT scan of her chest done on 02/23/2018 in [place] which I have reviewed.  There is an irregularly-shaped left lung central superior mass-like lesion. Appears to be in the medial LUL. She had a quant-GOLD done at her PCP, not yet back yet. She has had a mold exposure, unknown whether she has ever been exposed to TB.   Her cough is rare, but she does still see streaks of blood in the mucous. No CP. She can feel some tachycardia with exertion. Has to stop to rest with stairs, has dyspnea. No wheeze. No epistaxis.   ROV 04/05/18 --[name] is 61, never smoker, follows up for her history of hemoptysis and abnormal CT scan of the chest with a left lower lobe nodule.  We performed navigational bronchoscopy on 03/07/2018 and pathology showed adenocarcinoma of the lung.  She is planning for VATS and resection by Dr Roxan Hockey in early January.  I treated her for an acute bronchitis recently with doxycycline - cough is better.  She hasn't had her PFT yet, postponed for the bronchitis.    Review of Systems  Constitutional: Negative for fever and unexpected weight change.  HENT: Positive for congestion. Negative for dental problem, ear pain, nosebleeds, postnasal drip, rhinorrhea, sinus pressure, sneezing, sore throat and trouble swallowing.   Eyes: Negative for redness and itching.  Respiratory: Positive for cough and shortness of breath. Negative for chest tightness and wheezing.   Cardiovascular: Negative for palpitations and leg swelling.    Gastrointestinal: Negative for nausea and vomiting.  Genitourinary: Negative for dysuria.  Musculoskeletal: Negative for joint swelling.  Skin: Negative for rash.  Neurological: Positive for headaches.  Hematological: Does not bruise/bleed easily.  Psychiatric/Behavioral: Negative for dysphoric mood. The patient is not nervous/anxious.    Past Medical History:  Diagnosis Date  . Anemia   . Atherosclerosis   . GERD (gastroesophageal reflux disease)   . Hemoptysis   . Hyperlipidemia   . Hypertension   . IBS (irritable bowel syndrome)   . Lung mass   . Migraines   . Spondylosis   . Vitamin D deficiency      Family History  Problem Relation Age of Onset  . Dementia Mother   . Hyperlipidemia Mother   . Hypertension Mother   . Thyroid disease Mother   . Irritable bowel syndrome Mother   . Aortic stenosis Mother   . Cancer Father   . Lung cancer Sister      Social History   Socioeconomic History  . Marital status: Married    Spouse name: Not on file  . Number of children: Not on file  . Years of education: Not on file  . Highest education level: Not on file  Occupational History  . Not on file  Social Needs  . Financial resource strain: Not on file  . Food insecurity:    Worry: Not on file    Inability: Not on file  . Transportation needs:    Medical: Not on  file    Non-medical: Not on file  Tobacco Use  . Smoking status: Never Smoker  . Smokeless tobacco: Never Used  Substance and Sexual Activity  . Alcohol use: Yes    Alcohol/week: 4.0 standard drinks    Types: 4 Glasses of wine per week    Comment: a week  . Drug use: Never  . Sexual activity: Not on file  Lifestyle  . Physical activity:    Days per week: Not on file    Minutes per session: Not on file  . Stress: Not on file  Relationships  . Social connections:    Talks on phone: Not on file    Gets together: Not on file    Attends religious service: Not on file    Active member of club or  organization: Not on file    Attends meetings of clubs or organizations: Not on file    Relationship status: Not on file  . Intimate partner violence:    Fear of current or ex partner: Not on file    Emotionally abused: Not on file    Physically abused: Not on file    Forced sexual activity: Not on file  Other Topics Concern  . Not on file  Social History Narrative  . 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    Allergies  Allergen Reactions  . Proton Pump Inhibitors Anaphylaxis  . Sulfites Anaphylaxis, Swelling and Other (See Comments)    Turn red and throat swells  . Wasp Venom Protein Swelling  . Aspartame Other (See Comments)    Severe diarrhea  . Avocado Other (See Comments)    Chest tightness, nauseous  . Cefaclor Hives  . Chlorhexidine Other (See Comments)    Oral solution, dizziness occured  . Chloroxine Other (See Comments)    Severe migraine, chemical cleaning products  . Chocolate Other (See Comments)    Acne  . Ciprofloxacin Hcl Hives  . Clarithromycin Hives  . Gatifloxacin Hives  . Influenza Vaccines Other (See Comments)    Couldn't breathe  . Ivp Dye [Iodinated Diagnostic Agents] Other (See Comments)    Acne  . Ketorolac Other (See Comments)    Extreme dizziness and fatigue  . Lactase Other (See Comments)    Unknown  . Molds & Smuts Other (See Comments)    Unknown  . Nexium [Esomeprazole Magnesium] Other (See Comments)    Throat spasms  . Other Other (See Comments)    Grass and Trees, Maple, [place], [place], [place] mix, [place], [place], Rag weed, [place], Pollen, Dog and cats, Technical sales engineer and Down. ALL ANTIBIOTICS CAUSE HIVES EXCEPT Z-PAKS  . Penicillins Hives  . Pork-Derived Products Other (See Comments)    religious  . Prednisolone Other (See Comments)    Extreme dizziness and fatigue  . Shellfish Allergy Other (See Comments)    Acne  . Soap Other (See Comments)    Soft soap- severe dermatitis   Soft  Soap  Brand name  . Sulfa Antibiotics Hives  . Sulfamethoxazole-Trimethoprim Hives  . Zantac [Ranitidine Hcl] Other (See Comments)    Severe throat spasms     Outpatient Medications Prior to Visit  Medication Sig Dispense Refill  . azithromycin (ZITHROMAX) 250 MG tablet Take as directed 6 tablet 0  . beta carotene 25000 UNIT capsule Take 25,000 Units by mouth every other day.    . Cholecalciferol (VITAMIN D3) 50 MCG (2000 UT) TABS  Take 4,000 Units by mouth daily.    . Coenzyme Q10 (COQ10) 200 MG CAPS Take 200 mg by mouth daily.    Marland Kitchen EPINEPHrine (EPIPEN 2-PAK) 0.3 mg/0.3 mL IJ SOAJ injection Inject 0.3 mg into the muscle once.    . Levomefolate Glucosamine (METHYLFOLATE PO) Place 800 mcg under the tongue every other day.    . Multiple Vitamins-Minerals (EMERGEN-C IMMUNE PO) Take 1 packet by mouth daily as needed (for immune system).    . NONFORMULARY OR COMPOUNDED ITEM Apply 3 mLs topically at bedtime. Biest 50:50 1mg  Testosterone 1 mg    . NONFORMULARY OR COMPOUNDED ITEM Place 150 mg inside cheek at bedtime. Progesterone 150 mg [place]    . Olopatadine HCl (PATANASE) 0.6 % SOLN Place 1-2 sprays into the nose 2 (two) times daily as needed (for congestion).     . Omega-3 1000 MG CAPS Take 2,000 mg by mouth daily.     . Probiotic Product (PROBIOTIC PO) Take 1 capsule by mouth daily.    . Riboflavin 100 MG CAPS Take 200 mg by mouth daily.    . rizatriptan (MAXALT) 10 MG tablet Take 5 mg by mouth as needed for migraine. May repeat in 2 hours if needed     . rosuvastatin (CRESTOR) 5 MG tablet Take 5 mg by mouth daily.    . sucralfate (CARAFATE) 1 g tablet Take 1 g by mouth See admin instructions. Crush and drink 1 g by mouth 2-3 times daily    . vitamin A 8000 UNIT capsule Take 8,000 Units by mouth every other day.    . vitamin B-12 (CYANOCOBALAMIN) 1000 MCG tablet Take 1,000 mcg by mouth every other day.     No facility-administered medications prior to visit.         Objective:    Physical Exam Vitals:   04/05/18 1457  BP: 136/84  Pulse: 91  SpO2: 99%   Gen: Pleasant, well-nourished, in no distress,  normal affect  ENT: No lesions,  mouth clear,  oropharynx clear, no postnasal drip  Neck: No JVD, no stridor  Lungs: No use of accessory muscles, no crackles or wheezing  Cardiovascular: RRR, heart sounds normal, no murmur or gallops, no peripheral edema  Musculoskeletal: No deformities, no cyanosis or clubbing  Neuro: alert, non focal  Skin: Warm, no lesions or rash      Assessment & Plan:  Adenocarcinoma, lung ([place]) Planning for VATS and lobectomy with Dr. Roxan Hockey in early January.  She needs to have her PFT, these were postponed because she developed a bronchitis.  She can follow with me if any issues with her breathing postoperatively or if any evidence of obstruction on her PFT.  Acute bronchitis Improving with azithromycin.  She has completed this.  No new intervention at this time  Baltazar Apo, MD, PhD 04/05/2018, 3:24 PM [facility] [phone] or if no answer [phone]

## 2018-04-09 ENCOUNTER — Telehealth: Payer: Self-pay | Admitting: *Deleted

## 2018-04-09 NOTE — Telephone Encounter (Signed)
[name], please see pt's mychart message as shown below and advise recs for pt. Thanks!  Dr Lamonte Sakai, I saw you for a follow up visit last Thursday. At that time, I was on Zithromax for a possible bronchial infection, but the cough had stopped. That night it got worse. I coughed all the way to [place]. Robitussin helps for  a while. The mucus is mostly medium brown when I cough deeply. My broncoscopy  was Nov 20th. I finished the Z-pac last Sat nt. Do you want me to keep coughing this up, or take more cough meds. ? There is a Writer at [address] , [phone] if needed. We are here until next Monday but can return earlier if necessary. I am concerned that if this continues, I will have trouble w/ the lung function tests next Tues(dec 31), as it is wearing me out! Surgery is also scheduled for the following week. The surgery nurse, Hinton Dyer, called me today for something else and I told her about the issue. She said it is perfectly normal after a broncoscopy, so I felt some relief. Thank you for any advice you can give. [name]

## 2018-04-09 NOTE — Telephone Encounter (Signed)
Called and spoke with pt letting her know that Aaron Edelman stated pt should come in for an appt due to not improving.  When stated that to pt, pt stated that she was still in [place] and would not be back in [place] until 12/31 when she is scheduled to have PFT at [facility]. Pt said she could come back from [place] early if she had to but stated it would take her 2 days to get back.

## 2018-04-09 NOTE — Telephone Encounter (Signed)
Oncology Nurse Navigator Documentation  Oncology Nurse Navigator Flowsheets 04/09/2018  Navigator Location [facility]  Navigator Encounter Type Clinic/MDC;Telephone/I spoke with patient and her husband at thoracic clinic.  I gave and explained information on her type of lung cancer and treatment plan on 03/29/18 Today, I called her to see if she had any questions regarding her upcoming surgery.  She had many questions but I clarified them.  I asked that she call me back if needed for more information.  She was thankful for the call and follow up.   Telephone Outgoing Call  Abnormal Finding Date 03/01/2018  Confirmed Diagnosis Date 04/06/2018  Surgery Date 04/25/2018  Multidisiplinary Clinic Date 03/29/2018  Treatment Initiated Date 04/25/2018  Patient Visit Type MedOnc  Treatment Phase Pre-Tx/Tx Discussion  Barriers/Navigation Needs Education;Coordination of Care  Education Newly Diagnosed Cancer Education  Interventions Coordination of Care;Education  Education Method Verbal  Acuity Level 2  Time Spent with Patient 40

## 2018-04-09 NOTE — Telephone Encounter (Signed)
Thank you for letting me know.  Due to patient's allergy list it is quite difficult to prescribe an antibiotic without further evaluation.  If patient feels like her symptoms are worsening she can present to an urgent care or emergency room for further evaluation in [place].  Thank you for contacting the patient.  Wyn Quaker, FNP

## 2018-04-09 NOTE — Telephone Encounter (Signed)
04/09/18  1658  I would suggest the patient needs to follow-up with our office.  Sounds like symptoms have not improved and she needs further evaluation.  Difficult to tell what exactly the patient is coughing up.  Wyn Quaker, FNP

## 2018-04-09 NOTE — Telephone Encounter (Signed)
Called pt. Pt was not comfortable going to urgent care in [place]. Stated she would leave [place] early if she had to to come to our office for an appt.  I scheduled pt for an OV with  RB Tues. 12/31 at 11:30am. Pt was supposed to have PFT at [facility] at 11:00 but stated she would come to our office for appt with RB and wanted to know if there was a way she could have  PFT done at our office. There was an opening at 12pm. I scheduled pt to have PFT at 12pm after OV with RB. Stated to pt if RB did not want her to have PFT done based on her symptoms, he would let her know at [facility]. Nothing further needed.

## 2018-04-16 ENCOUNTER — Other Ambulatory Visit: Payer: Self-pay | Admitting: Emergency Medicine

## 2018-04-16 DIAGNOSIS — C3492 Malignant neoplasm of unspecified part of left bronchus or lung: Secondary | ICD-10-CM

## 2018-04-17 ENCOUNTER — Encounter (HOSPITAL_COMMUNITY): Payer: Self-pay

## 2018-04-17 ENCOUNTER — Ambulatory Visit (INDEPENDENT_AMBULATORY_CARE_PROVIDER_SITE_OTHER): Payer: Medicare Other | Admitting: Emergency Medicine

## 2018-04-17 ENCOUNTER — Encounter (HOSPITAL_COMMUNITY): Payer: Medicare Other

## 2018-04-17 ENCOUNTER — Encounter: Payer: Self-pay | Admitting: Emergency Medicine

## 2018-04-17 DIAGNOSIS — C3492 Malignant neoplasm of unspecified part of left bronchus or lung: Secondary | ICD-10-CM

## 2018-04-17 DIAGNOSIS — J209 Acute bronchitis, unspecified: Secondary | ICD-10-CM

## 2018-04-17 LAB — PULMONARY FUNCTION TEST
FEF 25-75 Pre: 1.76 L/sec
FEF2575-%Pred-Pre: 88 %
FEV1-%Pred-Pre: 98 %
FEV1-Pre: 2.28 L
FEV1FVC-%Pred-Pre: 97 %
FEV6-%Pred-Pre: 104 %
FEV6-Pre: 3.06 L
FEV6FVC-%Pred-Pre: 104 %
FVC-%Pred-Pre: 99 %
FVC-Pre: 3.06 L
Pre FEV1/FVC ratio: 75 %
Pre FEV6/FVC Ratio: 100 %
RV % pred: 105 %
RV: 2.26 L
TLC % pred: 108 %
TLC: 5.47 L

## 2018-04-17 MED ORDER — BENZONATATE 200 MG PO CAPS: 200.0000 mg | ORAL_CAPSULE | Freq: Three times a day (TID) | ORAL | 2 refills | Status: DC | PRN

## 2018-04-17 NOTE — Assessment & Plan Note (Signed)
Her bronchitic symptoms did clear with azithromycin.  She still having cough, feels dehydrated, some lightheadedness, weakness.  I think that much of what she is experiencing is related to the underlying malignancy.  She will probably have systemic symptoms and some degree of cough until she can undergo her surgical resection.  Discussed this with her today.

## 2018-04-17 NOTE — Progress Notes (Signed)
Patient completed pre spiro, and Pleth of the PFT. Pt was unable to take a deep enough breath to pass the DLCO requirements. Pt tried DLCO 4 times and could not pass any of them. PT refused to complete the post spiro, therefore did not complete that portion. She did not want to chance having an allergic reaction.

## 2018-04-17 NOTE — Assessment & Plan Note (Signed)
Planning for VATS resection with Dr. Roxan Hockey next week.  She needs her pulmonary function testing.  She is concerned that she may have trouble this due to cough, lightheadedness.  I tried to reassure her that I believe she can tolerate and stressed the importance of getting these done prior to her surgery.  The PFTs are planned for this afternoon.

## 2018-04-17 NOTE — Patient Instructions (Signed)
Complete your pulmonary function testing today. Use Tessalon Perles up to every 6 hours if needed for cough suppression. Surgery with Dr Roxan Hockey as planned next week.  Follow with Dr. Lamonte Sakai if needed for any problems or questions.

## 2018-04-17 NOTE — Progress Notes (Signed)
Subjective:    Patient ID: [name], female    DOB: 05/09/1950, 67 y.o.   MRN: [id number]   HPI 67 year old never smoker with a history of hypertension, IBS, hyperlipidemia, GERD, allergies.  She is referred today for evaluation of an abnormal CT scan of her chest.   She noted some streaks of blood in her mucus about 6 months ago.  She had an ENT eval that was unremarkable. The amout progressed since September after an apparent URI.  This prompted a CT scan of her chest done on 02/23/2018 in [place] which I have reviewed.  There is an irregularly-shaped left lung central superior mass-like lesion. Appears to be in the medial LUL. She had a quant-GOLD done at her PCP, not yet back yet. She has had a mold exposure, unknown whether she has ever been exposed to TB.   Her cough is rare, but she does still see streaks of blood in the mucous. No CP. She can feel some tachycardia with exertion. Has to stop to rest with stairs, has dyspnea. No wheeze. No epistaxis.   ROV 04/05/18 --[name] is 67, never smoker, follows up for her history of hemoptysis and abnormal CT scan of the chest with a left lower lobe nodule.  We performed navigational bronchoscopy on 03/07/2018 and pathology showed adenocarcinoma of the lung.  She is planning for VATS and resection by Dr Roxan Hockey in early January.  I treated her for an acute bronchitis recently with doxycycline - cough is better.  She hasn't had her PFT yet, postponed for the bronchitis.    ROV 04/17/18 --[name] follows up today for her history of adenocarcinoma of the lung that was diagnosed by bronchoscopy.  There is a postobstructive component present and she has experienced bronchitic symptoms.  I treated her with azithro post bronchoscopy.  She is due for pulmonary function testing and then VATS resection by Dr. Roxan Hockey on 04/25/2018.  She returns today reporting that she is having a bit less cough, still with some colored sputum, clear to tan.  She feels dry, is having some sweats.    Review of Systems  Constitutional: Negative for fever and unexpected weight change.  HENT: Positive for congestion. Negative for dental problem, ear pain, nosebleeds, postnasal drip, rhinorrhea, sinus pressure, sneezing, sore throat and trouble swallowing.   Eyes: Negative for redness and itching.  Respiratory: Positive for cough and shortness of breath. Negative for chest tightness and wheezing.   Cardiovascular: Negative for palpitations and leg swelling.  Gastrointestinal: Negative for nausea and vomiting.  Genitourinary: Negative for dysuria.  Musculoskeletal: Negative for joint swelling.  Skin: Negative for rash.  Neurological: Positive for headaches.  Hematological: Does not bruise/bleed easily.  Psychiatric/Behavioral: Negative for dysphoric mood. The patient is not nervous/anxious.    Past Medical History:  Diagnosis Date  . Anemia   . Atherosclerosis   . GERD (gastroesophageal reflux disease)   . Hemoptysis   . Hyperlipidemia   . Hypertension   . IBS (irritable bowel syndrome)   . Lung mass   . Migraines   . Spondylosis   . Vitamin D deficiency      Family History  Problem Relation Age of Onset  . Dementia Mother   . Hyperlipidemia Mother   . Hypertension Mother   . Thyroid disease Mother   . Irritable bowel syndrome Mother   . Aortic stenosis Mother   . Cancer Father   . Lung cancer Sister      Social  History   Socioeconomic History  . Marital status: Married    Spouse name: Not on file  . Number of children: Not on file  . Years of education: Not on file  . Highest education level: Not on file  Occupational History  . Not on file  Social Needs  . Financial resource strain: Not on file  . Food insecurity:    Worry: Not on file    Inability: Not on file  . Transportation needs:    Medical: Not on file    Non-medical: Not on file  Tobacco Use  . Smoking status: Never Smoker  . Smokeless tobacco: Never  Used  Substance and Sexual Activity  . Alcohol use: Yes    Alcohol/week: 4.0 standard drinks    Types: 4 Glasses of wine per week    Comment: a week  . Drug use: Never  . Sexual activity: Not on file  Lifestyle  . Physical activity:    Days per week: Not on file    Minutes per session: Not on file  . Stress: Not on file  Relationships  . Social connections:    Talks on phone: Not on file    Gets together: Not on file    Attends religious service: Not on file    Active member of club or organization: Not on file    Attends meetings of clubs or organizations: Not on file    Relationship status: Not on file  . Intimate partner violence:    Fear of current or ex partner: Not on file    Emotionally abused: Not on file    Physically abused: Not on file    Forced sexual activity: Not on file  Other Topics Concern  . Not on file  Social History Narrative  . 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    Allergies  Allergen Reactions  . Proton Pump Inhibitors Anaphylaxis  . Sulfites Anaphylaxis, Swelling and Other (See Comments)    Turn red and throat swells  . Wasp Venom Protein Swelling  . Aspartame Other (See Comments)    Severe diarrhea  . Avocado Other (See Comments)    Chest tightness, nauseous  . Cefaclor Hives  . Chlorhexidine Other (See Comments)    Oral solution, dizziness occured  . Chloroxine Other (See Comments)    Severe migraine, chemical cleaning products  . Chocolate Other (See Comments)    Acne  . Ciprofloxacin Hcl Hives  . Clarithromycin Hives  . Gatifloxacin Hives  . Influenza Vaccines Other (See Comments)    Couldn't breathe  . Ivp Dye [Iodinated Diagnostic Agents] Other (See Comments)    Acne  . Ketorolac Other (See Comments)    Extreme dizziness and fatigue  . Lactase Other (See Comments)    Unknown  . Molds & Smuts Other (See Comments)    Unknown  . Nexium [Esomeprazole Magnesium] Other  (See Comments)    Throat spasms  . Other Other (See Comments)    Grass and Trees, Maple, [place], [place], [place] mix, [place], [place], Rag weed, [place], [place], Dog and cats, Technical sales engineer and Down. ALL ANTIBIOTICS CAUSE HIVES EXCEPT Z-PAKS  . Penicillins Hives and Other (See Comments)    DID THE REACTION INVOLVE: Swelling of the face/tongue/throat, SOB, or low BP? Unknown Sudden or severe rash/hives, skin peeling, or the inside of the mouth or nose? Unknown Did it require medical treatment? Yes When did  it last happen? Teenager If all above answers are "NO", may proceed with cephalosporin use.   . Pork-Derived Products Other (See Comments)    religious  . Prednisolone Other (See Comments)    Extreme dizziness and fatigue  . Shellfish Allergy Other (See Comments)    Acne  . Soap Other (See Comments)    Soft soap- severe dermatitis   Soft Soap  Brand name  . Sulfa Antibiotics Hives  . Sulfamethoxazole-Trimethoprim Hives  . Zantac [Ranitidine Hcl] Other (See Comments)    Severe throat spasms     Outpatient Medications Prior to Visit  Medication Sig Dispense Refill  . beta carotene 25000 UNIT capsule Take 25,000 Units by mouth every other day.    . Cholecalciferol (VITAMIN D3) 50 MCG (2000 UT) TABS Take 4,000 Units by mouth daily.    . Coenzyme Q10 (COQ10) 200 MG CAPS Take 200 mg by mouth daily.    Marland Kitchen EPINEPHrine (EPIPEN 2-PAK) 0.3 mg/0.3 mL IJ SOAJ injection Inject 0.3 mg into the muscle once.    . Levomefolate Glucosamine (METHYLFOLATE PO) Place 800 mcg under the tongue every other day.    . Multiple Vitamins-Minerals (EMERGEN-C IMMUNE PO) Take 1 packet by mouth daily as needed (for immune system).    . Olopatadine HCl (PATANASE) 0.6 % SOLN Place 1-2 sprays into the nose 2 (two) times daily as needed (for congestion).     . Omega-3 1000 MG CAPS Take 2,000 mg by mouth daily.     . Probiotic Product (PROBIOTIC PO) Take 1 capsule by mouth daily.    . Riboflavin 100 MG CAPS Take 200 mg by mouth every  other day.     . rizatriptan (MAXALT) 10 MG tablet Take 5 mg by mouth as needed for migraine. May repeat in 2 hours if needed     . rosuvastatin (CRESTOR) 5 MG tablet Take 5 mg by mouth daily.    . sucralfate (CARAFATE) 1 g tablet Take 1 g by mouth See admin instructions. Crush and drink 1 g by mouth 2-3 times daily    . vitamin A 8000 UNIT capsule Take 8,000 Units by mouth every other day.    . vitamin B-12 (CYANOCOBALAMIN) 1000 MCG tablet Take 1,000 mcg by mouth every other day.     No facility-administered medications prior to visit.         Objective:   Physical Exam Vitals:   04/17/18 1140  BP: (!) 142/90  Pulse: 99  SpO2: 98%  Weight: 146 lb 6.4 oz (66.4 kg)  Height: 5\' 4"  (1.626 m)   Gen: Pleasant, well-nourished, in no distress,  normal affect  ENT: No lesions,  mouth clear,  oropharynx clear, no postnasal drip  Neck: No JVD, no stridor  Lungs: No use of accessory muscles, no crackles or wheezing  Cardiovascular: RRR, heart sounds normal, no murmur or gallops, no peripheral edema  Musculoskeletal: No deformities, no cyanosis or clubbing  Neuro: alert, non focal  Skin: Warm, no lesions or rash      Assessment & Plan:  Acute bronchitis Her bronchitic symptoms did clear with azithromycin.  She still having cough, feels dehydrated, some lightheadedness, weakness.  I think that much of what she is experiencing is related to the underlying malignancy.  She will probably have systemic symptoms and some degree of cough until she can undergo her surgical resection.  Discussed this with her today.  Adenocarcinoma, lung ([place]) Planning for VATS resection with Dr. Roxan Hockey next week.  She needs  her pulmonary function testing.  She is concerned that she may have trouble this due to cough, lightheadedness.  I tried to reassure her that I believe she can tolerate and stressed the importance of getting these done prior to her surgery.  The PFTs are planned for this  afternoon.  Baltazar Apo, MD, PhD 04/17/2018, 12:44 PM [facility] [phone] or if no answer [phone]

## 2018-04-23 NOTE — Progress Notes (Addendum)
See other progress note.

## 2018-04-23 NOTE — Pre-Procedure Instructions (Addendum)
ILANNA DEIHL  04/23/2018      [id number] [address], [place] - [place] [place] [address] Phone: [phone] Fax: [phone]    Your procedure is scheduled on April 25, 2018.  Report to [facility] Admitting at 630 AM.  Call this number if you have problems the morning of surgery:  [phone]   Remember:  Do not eat or drink after midnight.    Take these medicines the morning of surgery with A SIP OF WATER  NONE  Beginning now, STOP taking any Aspirin (unless otherwise instructed by your surgeon), Aleve, Naproxen, Ibuprofen, Motrin, Advil, Goody's, BC's, all herbal medications, fish oil, and all vitamins    Do not wear jewelry, make-up or nail polish.  Do not wear lotions, powders, or perfumes, or deodorant.  Do not shave 48 hours prior to surgery.    Do not bring valuables to the hospital.  [facility] is not responsible for any belongings or valuables.  Contacts, dentures or bridgework may not be worn into surgery.  Leave your suitcase in the car.  After surgery it may be brought to your room.  For patients admitted to the hospital, discharge time will be determined by your treatment team.  Patients discharged the day of surgery will not be allowed to drive home.    [place]- Preparing For Surgery  Before surgery, you can play an important role. Because skin is not sterile, your skin needs to be as free of germs as possible. You can reduce the number of germs on your skin by washing with CHG (chlorahexidine gluconate) Soap before surgery.  CHG is an antiseptic cleaner which kills germs and bonds with the skin to continue killing germs even after washing.    Oral Hygiene is also important to reduce your risk of infection.  Remember - BRUSH YOUR TEETH THE MORNING OF SURGERY WITH YOUR REGULAR TOOTHPASTE  Please do not use if you have an allergy to CHG or antibacterial soaps. If your  skin becomes reddened/irritated stop using the CHG.  Do not shave (including legs and underarms) for at least 48 hours prior to first CHG shower. It is OK to shave your face.  Please follow these instructions carefully.   1. Shower the NIGHT BEFORE SURGERY and the MORNING OF SURGERY with CHG.   2. If you chose to wash your hair, wash your hair first as usual with your normal shampoo.  3. After you shampoo, rinse your hair and body thoroughly to remove the shampoo.  4. Use CHG as you would any other liquid soap. You can apply CHG directly to the skin and wash gently with a scrungie or a clean washcloth.   5. Apply the CHG Soap to your body ONLY FROM THE NECK DOWN.  Do not use on open wounds or open sores. Avoid contact with your eyes, ears, mouth and genitals (private parts). Wash Face and genitals (private parts)  with your normal soap.  6. Wash thoroughly, paying special attention to the area where your surgery will be performed.  7. Thoroughly rinse your body with warm water from the neck down.  8. DO NOT shower/wash with your normal soap after using and rinsing off the CHG Soap.  9. Pat yourself dry with a CLEAN TOWEL.  10. Wear CLEAN PAJAMAS to bed the night before surgery, wear comfortable clothes the morning of surgery  11. Place CLEAN  SHEETS on your bed the night of your first shower and DO NOT SLEEP WITH PETS.  Day of Surgery:  Do not apply any deodorants/lotions.  Please wear clean clothes to the hospital/surgery center.   Remember to brush your teeth WITH YOUR REGULAR TOOTHPASTE.  Please read over the following fact sheets that you were given.

## 2018-04-24 ENCOUNTER — Encounter (HOSPITAL_COMMUNITY)
Admission: RE | Admit: 2018-04-24 | Discharge: 2018-04-24 | Disposition: A | Discharge status: Home / Self Care | Payer: Medicare Other | Source: Ambulatory Visit | Attending: Thoracic Surgery (Cardiothoracic Vascular Surgery) | Admitting: Thoracic Surgery (Cardiothoracic Vascular Surgery)

## 2018-04-24 ENCOUNTER — Other Ambulatory Visit: Payer: Self-pay

## 2018-04-24 ENCOUNTER — Encounter (HOSPITAL_COMMUNITY): Payer: Self-pay

## 2018-04-24 ENCOUNTER — Ambulatory Visit (HOSPITAL_COMMUNITY)
Admission: RE | Admit: 2018-04-24 | Discharge: 2018-04-24 | Disposition: A | Discharge status: Home / Self Care | Payer: Medicare Other | Source: Ambulatory Visit | Attending: Thoracic Surgery (Cardiothoracic Vascular Surgery) | Admitting: Thoracic Surgery (Cardiothoracic Vascular Surgery)

## 2018-04-24 DIAGNOSIS — C3432 Malignant neoplasm of lower lobe, left bronchus or lung: Secondary | ICD-10-CM

## 2018-04-24 LAB — URINALYSIS, ROUTINE W REFLEX MICROSCOPIC
Bilirubin Urine: NEGATIVE
Glucose, UA: NEGATIVE mg/dL
Hgb urine dipstick: NEGATIVE
Ketones, ur: NEGATIVE mg/dL
Leukocytes, UA: NEGATIVE
Nitrite: NEGATIVE
Protein, ur: NEGATIVE mg/dL
Specific Gravity, Urine: 1.02 (ref 1.005–1.030)
pH: 5 (ref 5.0–8.0)

## 2018-04-24 LAB — COMPREHENSIVE METABOLIC PANEL
ALT: 25 U/L (ref 0–44)
AST: 26 U/L (ref 15–41)
Albumin: 3.9 g/dL (ref 3.5–5.0)
Alkaline Phosphatase: 49 U/L (ref 38–126)
Anion gap: 11 (ref 5–15)
BUN: 16 mg/dL (ref 8–23)
CO2: 20 mmol/L — ABNORMAL LOW (ref 22–32)
Calcium: 9.3 mg/dL (ref 8.9–10.3)
Chloride: 103 mmol/L (ref 98–111)
Creatinine, Ser: 0.88 mg/dL (ref 0.44–1.00)
GFR calc Af Amer: >60 mL/min (ref >60)
GFR calc non Af Amer: >60 mL/min (ref >60)
Glucose, Bld: 135 mg/dL — ABNORMAL HIGH (ref 70–99)
Potassium: 4.2 mmol/L (ref 3.5–5.1)
Sodium: 134 mmol/L — ABNORMAL LOW (ref 135–145)
Total Bilirubin: 0.5 mg/dL (ref 0.3–1.2)
Total Protein: 6.6 g/dL (ref 6.5–8.1)

## 2018-04-24 LAB — CBC
HCT: 36.4 % (ref 36.0–46.0)
Hemoglobin: 11.7 g/dL — ABNORMAL LOW (ref 12.0–15.0)
MCH: 30.5 pg (ref 26.0–34.0)
MCHC: 32.1 g/dL (ref 30.0–36.0)
MCV: 95 fL (ref 80.0–100.0)
Platelets: 282 10*3/uL (ref 150–400)
RBC: 3.83 MIL/uL — ABNORMAL LOW (ref 3.87–5.11)
RDW: 12.3 % (ref 11.5–15.5)
WBC: 8.8 10*3/uL (ref 4.0–10.5)
nRBC: 0 % (ref 0.0–0.2)

## 2018-04-24 LAB — SURGICAL PCR SCREEN
MRSA, PCR: NEGATIVE
Staphylococcus aureus: NEGATIVE

## 2018-04-24 LAB — PROTIME-INR
INR: 1
Prothrombin Time: 13.1 seconds (ref 11.4–15.2)

## 2018-04-24 LAB — TYPE AND SCREEN
ABO/RH(D): A POS
Antibody Screen: NEGATIVE

## 2018-04-24 LAB — APTT: aPTT: 31 seconds (ref 24–36)

## 2018-04-24 LAB — ABO/RH: ABO/RH(D): A POS

## 2018-04-24 NOTE — Progress Notes (Addendum)
PCP - Dr. Lillette Boxer Cardiologist - Dr. Jennette Dubin  Chest x-ray - 1//10/2018 EKG - 04/24/2018  Stress Test - 5+ years ago ECHO - pt unsure Cardiac Cath - denies  Sleep Study - denies CPAP - n/a  Fasting Blood Sugar - n/a Checks Blood Sugar _____ times a day-n/a  Blood Thinner Instructions: n/a Aspirin Instructions: n/a  Anesthesia review: Yes, VATS case  Patient denies shortness of breath, fever, cough and chest pain at PAT appointment  Patient verbalized understanding of instructions that were given to them at the PAT appointment. Patient was also instructed that they will need to review over the PAT instructions again at home before surgery.

## 2018-04-24 NOTE — Progress Notes (Signed)
Pt not given CHG soap pre-operatively d/t severe allergy. Advised to wash with dial soap instead

## 2018-04-25 ENCOUNTER — Encounter (HOSPITAL_COMMUNITY)
Admission: RE | Disposition: A | Discharge status: Home / Self Care | Payer: Self-pay | Source: Home / Self Care | Attending: Thoracic Surgery (Cardiothoracic Vascular Surgery)

## 2018-04-25 ENCOUNTER — Inpatient Hospital Stay (HOSPITAL_COMMUNITY): Payer: Medicare Other | Admitting: Anesthesiology

## 2018-04-25 ENCOUNTER — Encounter (HOSPITAL_COMMUNITY): Payer: Self-pay

## 2018-04-25 ENCOUNTER — Other Ambulatory Visit: Payer: Self-pay

## 2018-04-25 ENCOUNTER — Inpatient Hospital Stay (HOSPITAL_COMMUNITY)
Admission: RE | Admit: 2018-04-25 | Discharge: 2018-05-16 | DRG: 164 | Disposition: A | Discharge status: Home / Self Care | Payer: Medicare Other | Attending: Thoracic Surgery (Cardiothoracic Vascular Surgery) | Admitting: Thoracic Surgery (Cardiothoracic Vascular Surgery)

## 2018-04-25 ENCOUNTER — Inpatient Hospital Stay (HOSPITAL_COMMUNITY): Payer: Medicare Other

## 2018-04-25 DIAGNOSIS — C3432 Malignant neoplasm of lower lobe, left bronchus or lung: Secondary | ICD-10-CM | POA: Diagnosis present

## 2018-04-25 DIAGNOSIS — Z419 Encounter for procedure for purposes other than remedying health state, unspecified: Secondary | ICD-10-CM

## 2018-04-25 DIAGNOSIS — D649 Anemia, unspecified: Secondary | ICD-10-CM | POA: Diagnosis present

## 2018-04-25 DIAGNOSIS — I700 Atherosclerosis of aorta: Secondary | ICD-10-CM | POA: Diagnosis present

## 2018-04-25 DIAGNOSIS — J95812 Postprocedural air leak: Secondary | ICD-10-CM | POA: Diagnosis not present

## 2018-04-25 DIAGNOSIS — D62 Acute posthemorrhagic anemia: Secondary | ICD-10-CM | POA: Diagnosis not present

## 2018-04-25 DIAGNOSIS — I10 Essential (primary) hypertension: Secondary | ICD-10-CM | POA: Diagnosis present

## 2018-04-25 DIAGNOSIS — G4700 Insomnia, unspecified: Secondary | ICD-10-CM | POA: Diagnosis present

## 2018-04-25 DIAGNOSIS — Z9841 Cataract extraction status, right eye: Secondary | ICD-10-CM

## 2018-04-25 DIAGNOSIS — J9382 Other air leak: Secondary | ICD-10-CM | POA: Diagnosis present

## 2018-04-25 DIAGNOSIS — Z8249 Family history of ischemic heart disease and other diseases of the circulatory system: Secondary | ICD-10-CM

## 2018-04-25 DIAGNOSIS — Z79899 Other long term (current) drug therapy: Secondary | ICD-10-CM

## 2018-04-25 DIAGNOSIS — Z4682 Encounter for fitting and adjustment of non-vascular catheter: Secondary | ICD-10-CM

## 2018-04-25 DIAGNOSIS — T368X5A Adverse effect of other systemic antibiotics, initial encounter: Secondary | ICD-10-CM | POA: Diagnosis present

## 2018-04-25 DIAGNOSIS — Z792 Long term (current) use of antibiotics: Secondary | ICD-10-CM

## 2018-04-25 DIAGNOSIS — F419 Anxiety disorder, unspecified: Secondary | ICD-10-CM | POA: Diagnosis present

## 2018-04-25 DIAGNOSIS — R042 Hemoptysis: Secondary | ICD-10-CM | POA: Diagnosis present

## 2018-04-25 DIAGNOSIS — K589 Irritable bowel syndrome without diarrhea: Secondary | ICD-10-CM | POA: Diagnosis present

## 2018-04-25 DIAGNOSIS — Z9842 Cataract extraction status, left eye: Secondary | ICD-10-CM | POA: Diagnosis not present

## 2018-04-25 DIAGNOSIS — Z8349 Family history of other endocrine, nutritional and metabolic diseases: Secondary | ICD-10-CM | POA: Diagnosis not present

## 2018-04-25 DIAGNOSIS — K219 Gastro-esophageal reflux disease without esophagitis: Secondary | ICD-10-CM | POA: Diagnosis present

## 2018-04-25 DIAGNOSIS — E785 Hyperlipidemia, unspecified: Secondary | ICD-10-CM | POA: Diagnosis present

## 2018-04-25 DIAGNOSIS — Z09 Encounter for follow-up examination after completed treatment for conditions other than malignant neoplasm: Secondary | ICD-10-CM

## 2018-04-25 DIAGNOSIS — T797XXA Traumatic subcutaneous emphysema, initial encounter: Secondary | ICD-10-CM | POA: Diagnosis not present

## 2018-04-25 DIAGNOSIS — Z902 Acquired absence of lung [part of]: Secondary | ICD-10-CM

## 2018-04-25 DIAGNOSIS — N644 Mastodynia: Secondary | ICD-10-CM | POA: Diagnosis present

## 2018-04-25 DIAGNOSIS — Z801 Family history of malignant neoplasm of trachea, bronchus and lung: Secondary | ICD-10-CM | POA: Diagnosis not present

## 2018-04-25 DIAGNOSIS — E559 Vitamin D deficiency, unspecified: Secondary | ICD-10-CM | POA: Diagnosis present

## 2018-04-25 DIAGNOSIS — G43909 Migraine, unspecified, not intractable, without status migrainosus: Secondary | ICD-10-CM | POA: Diagnosis present

## 2018-04-25 DIAGNOSIS — C3492 Malignant neoplasm of unspecified part of left bronchus or lung: Secondary | ICD-10-CM

## 2018-04-25 DIAGNOSIS — J939 Pneumothorax, unspecified: Secondary | ICD-10-CM

## 2018-04-25 HISTORY — PX: VIDEO ASSISTED THORACOSCOPY (VATS)/ LOBECTOMY: SHX6169

## 2018-04-25 LAB — BLOOD GAS, ARTERIAL
Acid-base deficit: 0.4 mmol/L (ref 0.0–2.0)
Bicarbonate: 22.4 mmol/L (ref 20.0–28.0)
Drawn by: 470591
O2 Saturation: 96.8 %
Patient temperature: 98.6
pCO2 arterial: 28.5 mmHg — ABNORMAL LOW (ref 32.0–48.0)
pH, Arterial: 7.506 — ABNORMAL HIGH (ref 7.350–7.450)
pO2, Arterial: 85.3 mmHg (ref 83.0–108.0)

## 2018-04-25 LAB — GLUCOSE, CAPILLARY: Glucose-Capillary: 186 mg/dL — ABNORMAL HIGH (ref 70–99)

## 2018-04-25 SURGERY — VIDEO ASSISTED THORACOSCOPY (VATS)/ LOBECTOMY
Anesthesia: General | Site: Chest | Laterality: Left

## 2018-04-25 MED ORDER — NALOXONE HCL 0.4 MG/ML IJ SOLN: 0.4000 mg | INTRAMUSCULAR | Status: DC | PRN

## 2018-04-25 MED ORDER — POTASSIUM CHLORIDE 10 MEQ/50ML IV SOLN: 10.0000 meq | Freq: Every day | INTRAVENOUS | Status: DC | PRN

## 2018-04-25 MED ORDER — ACETAMINOPHEN 160 MG/5ML PO SOLN
1000.0000 mg | Freq: Four times a day (QID) | ORAL | Status: AC
  Administered 2018-04-25: 1000 mg via ORAL
  Administered 2018-04-25: 650 mg via ORAL
  Administered 2018-04-26 – 2018-04-28 (×4): 1000 mg via ORAL
  Administered 2018-04-28: 500 mg via ORAL
  Administered 2018-04-29 (×2): 1000 mg via ORAL
  Administered 2018-04-29: 500 mg via ORAL
  Administered 2018-04-30: 1000 mg via ORAL
  Filled 2018-04-25 (×11): qty 40.6

## 2018-04-25 MED ORDER — VANCOMYCIN HCL IN DEXTROSE 1-5 GM/200ML-% IV SOLN
1000.0000 mg | Freq: Two times a day (BID) | INTRAVENOUS | Status: AC
  Administered 2018-04-25: 1000 mg via INTRAVENOUS
  Filled 2018-04-25: qty 200

## 2018-04-25 MED ORDER — DEXTROSE-NACL 5-0.45 % IV SOLN
INTRAVENOUS | Status: DC
  Administered 2018-05-10: 20:00:00 via INTRAVENOUS

## 2018-04-25 MED ORDER — PROMETHAZINE HCL 25 MG/ML IJ SOLN: 6.2500 mg | INTRAMUSCULAR | Status: DC | PRN

## 2018-04-25 MED ORDER — FENTANYL CITRATE (PF) 250 MCG/5ML IJ SOLN
INTRAMUSCULAR | Status: AC
  Filled 2018-04-25: qty 5

## 2018-04-25 MED ORDER — LACTATED RINGERS IV SOLN
INTRAVENOUS | Status: DC | PRN
  Administered 2018-04-25 (×2): via INTRAVENOUS

## 2018-04-25 MED ORDER — VANCOMYCIN HCL IN DEXTROSE 1-5 GM/200ML-% IV SOLN
1000.0000 mg | INTRAVENOUS | Status: AC
  Administered 2018-04-25: 1000 mg via INTRAVENOUS
  Filled 2018-04-25: qty 200

## 2018-04-25 MED ORDER — MIDAZOLAM HCL 2 MG/2ML IJ SOLN
INTRAMUSCULAR | Status: AC
  Filled 2018-04-25: qty 2

## 2018-04-25 MED ORDER — ACETAMINOPHEN 10 MG/ML IV SOLN
INTRAVENOUS | Status: DC | PRN
  Administered 2018-04-25: 1000 mg via INTRAVENOUS

## 2018-04-25 MED ORDER — SODIUM CHLORIDE 0.9% FLUSH: 9.0000 mL | INTRAVENOUS | Status: DC | PRN

## 2018-04-25 MED ORDER — FENTANYL 40 MCG/ML IV SOLN
INTRAVENOUS | Status: DC
  Administered 2018-04-25: 15 ug via INTRAVENOUS
  Administered 2018-04-26 (×2): 0 ug via INTRAVENOUS
  Administered 2018-04-26: 15 ug via INTRAVENOUS
  Filled 2018-04-25: qty 1000

## 2018-04-25 MED ORDER — ROCURONIUM BROMIDE 50 MG/5ML IV SOSY
PREFILLED_SYRINGE | INTRAVENOUS | Status: AC
  Filled 2018-04-25: qty 5

## 2018-04-25 MED ORDER — DEXAMETHASONE SODIUM PHOSPHATE 10 MG/ML IJ SOLN
INTRAMUSCULAR | Status: DC | PRN
  Administered 2018-04-25: 20 mg via INTRAVENOUS

## 2018-04-25 MED ORDER — DIPHENHYDRAMINE HCL 50 MG/ML IJ SOLN
12.5000 mg | Freq: Four times a day (QID) | INTRAMUSCULAR | Status: DC | PRN
  Administered 2018-04-26 – 2018-04-27 (×6): 12.5 mg via INTRAVENOUS
  Filled 2018-04-25 (×7): qty 1

## 2018-04-25 MED ORDER — AZELASTINE HCL 0.1 % NA SOLN
1.0000 | Freq: Two times a day (BID) | NASAL | Status: DC | PRN
  Filled 2018-04-25: qty 30

## 2018-04-25 MED ORDER — LIDOCAINE 2% (20 MG/ML) 5 ML SYRINGE
INTRAMUSCULAR | Status: DC | PRN
  Administered 2018-04-25: 80 mg via INTRAVENOUS

## 2018-04-25 MED ORDER — LIDOCAINE 2% (20 MG/ML) 5 ML SYRINGE
INTRAMUSCULAR | Status: AC
  Filled 2018-04-25: qty 5

## 2018-04-25 MED ORDER — ACETAMINOPHEN 10 MG/ML IV SOLN
INTRAVENOUS | Status: AC
  Filled 2018-04-25: qty 100

## 2018-04-25 MED ORDER — DEXAMETHASONE SODIUM PHOSPHATE 10 MG/ML IJ SOLN
INTRAMUSCULAR | Status: AC
  Filled 2018-04-25: qty 1

## 2018-04-25 MED ORDER — DIPHENHYDRAMINE HCL 12.5 MG/5ML PO ELIX
12.5000 mg | ORAL_SOLUTION | Freq: Four times a day (QID) | ORAL | Status: DC | PRN
  Filled 2018-04-25: qty 5

## 2018-04-25 MED ORDER — LACTATED RINGERS IV SOLN
INTRAVENOUS | Status: DC | PRN
  Administered 2018-04-25: 08:00:00 via INTRAVENOUS

## 2018-04-25 MED ORDER — FENTANYL CITRATE (PF) 250 MCG/5ML IJ SOLN
INTRAMUSCULAR | Status: DC | PRN
  Administered 2018-04-25 (×5): 50 ug via INTRAVENOUS

## 2018-04-25 MED ORDER — ONDANSETRON HCL 4 MG/2ML IJ SOLN
4.0000 mg | Freq: Four times a day (QID) | INTRAMUSCULAR | Status: DC | PRN
  Filled 2018-04-25: qty 2

## 2018-04-25 MED ORDER — BISACODYL 5 MG PO TBEC
10.0000 mg | DELAYED_RELEASE_TABLET | Freq: Every day | ORAL | Status: DC
  Administered 2018-04-28: 10 mg via ORAL
  Filled 2018-04-25 (×6): qty 2

## 2018-04-25 MED ORDER — ROCURONIUM BROMIDE 10 MG/ML (PF) SYRINGE
PREFILLED_SYRINGE | INTRAVENOUS | Status: DC | PRN
  Administered 2018-04-25: 10 mg via INTRAVENOUS
  Administered 2018-04-25: 50 mg via INTRAVENOUS
  Administered 2018-04-25 (×2): 10 mg via INTRAVENOUS

## 2018-04-25 MED ORDER — BUPIVACAINE HCL (PF) 0.5 % IJ SOLN
INTRAMUSCULAR | Status: DC | PRN
  Administered 2018-04-25: 30 mL

## 2018-04-25 MED ORDER — ALBUTEROL SULFATE (2.5 MG/3ML) 0.083% IN NEBU: 2.5000 mg | INHALATION_SOLUTION | RESPIRATORY_TRACT | Status: DC

## 2018-04-25 MED ORDER — PHENYLEPHRINE 40 MCG/ML (10ML) SYRINGE FOR IV PUSH (FOR BLOOD PRESSURE SUPPORT)
PREFILLED_SYRINGE | INTRAVENOUS | Status: AC
  Filled 2018-04-25: qty 10

## 2018-04-25 MED ORDER — MIDAZOLAM HCL 5 MG/5ML IJ SOLN
INTRAMUSCULAR | Status: DC | PRN
  Administered 2018-04-25: 2 mg via INTRAVENOUS

## 2018-04-25 MED ORDER — OLOPATADINE HCL 0.6 % NA SOLN: 1.0000 | Freq: Two times a day (BID) | NASAL | Status: DC | PRN

## 2018-04-25 MED ORDER — OXYCODONE HCL 5 MG PO TABS: 5.0000 mg | ORAL_TABLET | ORAL | Status: DC | PRN

## 2018-04-25 MED ORDER — HYDROMORPHONE HCL 1 MG/ML IJ SOLN
0.2500 mg | INTRAMUSCULAR | Status: DC | PRN
  Administered 2018-04-25 (×2): 0.5 mg via INTRAVENOUS

## 2018-04-25 MED ORDER — 0.9 % SODIUM CHLORIDE (POUR BTL) OPTIME
TOPICAL | Status: DC | PRN
  Administered 2018-04-25: 2000 mL

## 2018-04-25 MED ORDER — SODIUM CHLORIDE (PF) 0.9 % IJ SOLN
INTRAMUSCULAR | Status: DC | PRN
  Administered 2018-04-25: 50 mL via INTRAVENOUS

## 2018-04-25 MED ORDER — ROSUVASTATIN CALCIUM 5 MG PO TABS
5.0000 mg | ORAL_TABLET | Freq: Every day | ORAL | Status: DC
  Administered 2018-04-25: 5 mg via ORAL
  Filled 2018-04-25 (×4): qty 1

## 2018-04-25 MED ORDER — SUCCINYLCHOLINE CHLORIDE 200 MG/10ML IV SOSY
PREFILLED_SYRINGE | INTRAVENOUS | Status: DC | PRN
  Administered 2018-04-25: 100 mg via INTRAVENOUS

## 2018-04-25 MED ORDER — ALBUTEROL SULFATE (2.5 MG/3ML) 0.083% IN NEBU
2.5000 mg | INHALATION_SOLUTION | RESPIRATORY_TRACT | Status: DC
  Administered 2018-04-25 – 2018-04-26 (×2): 2.5 mg via RESPIRATORY_TRACT
  Filled 2018-04-25 (×2): qty 3

## 2018-04-25 MED ORDER — ONDANSETRON HCL 4 MG/2ML IJ SOLN
INTRAMUSCULAR | Status: DC | PRN
  Administered 2018-04-25: 4 mg via INTRAVENOUS

## 2018-04-25 MED ORDER — SUCCINYLCHOLINE CHLORIDE 200 MG/10ML IV SOSY
PREFILLED_SYRINGE | INTRAVENOUS | Status: AC
  Filled 2018-04-25: qty 10

## 2018-04-25 MED ORDER — FONDAPARINUX SODIUM 2.5 MG/0.5ML ~~LOC~~ SOLN
2.5000 mg | SUBCUTANEOUS | Status: DC
  Administered 2018-04-26 – 2018-05-15 (×21): 2.5 mg via SUBCUTANEOUS
  Filled 2018-04-25 (×21): qty 0.5

## 2018-04-25 MED ORDER — BUPIVACAINE HCL (PF) 0.5 % IJ SOLN
INTRAMUSCULAR | Status: AC
  Filled 2018-04-25: qty 30

## 2018-04-25 MED ORDER — ACETAMINOPHEN 500 MG PO TABS
1000.0000 mg | ORAL_TABLET | Freq: Four times a day (QID) | ORAL | Status: AC
  Filled 2018-04-25 (×4): qty 2

## 2018-04-25 MED ORDER — VANCOMYCIN HCL IN DEXTROSE 1-5 GM/200ML-% IV SOLN
INTRAVENOUS | Status: AC
  Filled 2018-04-25: qty 200

## 2018-04-25 MED ORDER — DIPHENHYDRAMINE HCL 50 MG/ML IJ SOLN
INTRAMUSCULAR | Status: DC | PRN
  Administered 2018-04-25: 25 mg via INTRAVENOUS

## 2018-04-25 MED ORDER — ONDANSETRON HCL 4 MG/2ML IJ SOLN
INTRAMUSCULAR | Status: AC
  Filled 2018-04-25: qty 2

## 2018-04-25 MED ORDER — PROPOFOL 10 MG/ML IV BOLUS
INTRAVENOUS | Status: DC | PRN
  Administered 2018-04-25: 50 mg via INTRAVENOUS
  Administered 2018-04-25: 80 mg via INTRAVENOUS
  Administered 2018-04-25: 120 mg via INTRAVENOUS

## 2018-04-25 MED ORDER — PROPOFOL 10 MG/ML IV BOLUS
INTRAVENOUS | Status: AC
  Filled 2018-04-25: qty 20

## 2018-04-25 MED ORDER — INSULIN ASPART 100 UNIT/ML ~~LOC~~ SOLN: 0.0000 [IU] | SUBCUTANEOUS | Status: DC

## 2018-04-25 MED ORDER — PROPOFOL 10 MG/ML IV BOLUS
INTRAVENOUS | Status: AC
  Filled 2018-04-25: qty 40

## 2018-04-25 MED ORDER — ONDANSETRON HCL 4 MG/2ML IJ SOLN: 4.0000 mg | Freq: Four times a day (QID) | INTRAMUSCULAR | Status: DC | PRN

## 2018-04-25 MED ORDER — SENNOSIDES-DOCUSATE SODIUM 8.6-50 MG PO TABS
1.0000 | ORAL_TABLET | Freq: Every day | ORAL | Status: DC
  Administered 2018-04-27: 1 via ORAL
  Filled 2018-04-25 (×4): qty 1

## 2018-04-25 MED ORDER — SODIUM CHLORIDE 0.9 % IV SOLN
INTRAVENOUS | Status: DC | PRN
  Administered 2018-04-25: 20 ug/min via INTRAVENOUS
  Administered 2018-04-25: 25 ug/min via INTRAVENOUS

## 2018-04-25 MED ORDER — SUGAMMADEX SODIUM 200 MG/2ML IV SOLN
INTRAVENOUS | Status: DC | PRN
  Administered 2018-04-25: 131.4 mg via INTRAVENOUS

## 2018-04-25 MED ORDER — BUPIVACAINE LIPOSOME 1.3 % IJ SUSP
INTRAMUSCULAR | Status: DC | PRN
  Administered 2018-04-25: 20 mL

## 2018-04-25 MED ORDER — SUCRALFATE 1 G PO TABS
1.0000 g | ORAL_TABLET | Freq: Two times a day (BID) | ORAL | Status: DC
  Filled 2018-04-25 (×8): qty 1

## 2018-04-25 MED ORDER — HYDROMORPHONE HCL 1 MG/ML IJ SOLN
INTRAMUSCULAR | Status: AC
  Filled 2018-04-25: qty 1

## 2018-04-25 SURGICAL SUPPLY — 92 items
APPLIER CLIP ROT 10 11.4 M/L (STAPLE)
CANISTER SUCT 3000ML PPV (MISCELLANEOUS) ×2 IMPLANT
CATH THORACIC 28FR (CATHETERS) IMPLANT
CATH THORACIC 28FR RT ANG (CATHETERS) IMPLANT
CATH THORACIC 36FR (CATHETERS) IMPLANT
CATH THORACIC 36FR RT ANG (CATHETERS) IMPLANT
CLIP APPLIE ROT 10 11.4 M/L (STAPLE) IMPLANT
CLIP VESOCCLUDE MED 6/CT (CLIP) ×1 IMPLANT
CONN ST 1/4X3/8  BEN (MISCELLANEOUS)
CONN ST 1/4X3/8 BEN (MISCELLANEOUS) IMPLANT
CONN Y 3/8X3/8X3/8  BEN (MISCELLANEOUS)
CONN Y 3/8X3/8X3/8 BEN (MISCELLANEOUS) IMPLANT
CONT SPEC 4OZ CLIKSEAL STRL BL (MISCELLANEOUS) ×15 IMPLANT
COVER SURGICAL LIGHT HANDLE (MISCELLANEOUS) IMPLANT
COVER WAND RF STERILE (DRAPES) ×2 IMPLANT
DERMABOND ADVANCED (GAUZE/BANDAGES/DRESSINGS) ×1
DERMABOND ADVANCED .7 DNX12 (GAUZE/BANDAGES/DRESSINGS) IMPLANT
DRAIN CHANNEL 28F RND 3/8 FF (WOUND CARE) IMPLANT
DRAIN CHANNEL 32F RND 10.7 FF (WOUND CARE) IMPLANT
DRAPE CV SPLIT W-CLR ANES SCRN (DRAPES) ×2 IMPLANT
DRAPE ORTHO SPLIT 77X108 STRL (DRAPES) ×1
DRAPE SURG ORHT 6 SPLT 77X108 (DRAPES) ×1 IMPLANT
DRAPE WARM FLUID 44X44 (DRAPE) ×1 IMPLANT
ELECT BLADE 6.5 EXT (BLADE) ×2 IMPLANT
ELECT REM PT RETURN 9FT ADLT (ELECTROSURGICAL) ×2
ELECTRODE REM PT RTRN 9FT ADLT (ELECTROSURGICAL) ×1 IMPLANT
GAUZE SPONGE 4X4 12PLY STRL (GAUZE/BANDAGES/DRESSINGS) ×2 IMPLANT
GAUZE SPONGE 4X4 12PLY STRL LF (GAUZE/BANDAGES/DRESSINGS) ×1 IMPLANT
GLOVE SURG SIGNA 7.5 PF LTX (GLOVE) ×4 IMPLANT
GOWN STRL REUS W/ TWL LRG LVL3 (GOWN DISPOSABLE) ×2 IMPLANT
GOWN STRL REUS W/ TWL XL LVL3 (GOWN DISPOSABLE) ×1 IMPLANT
GOWN STRL REUS W/TWL LRG LVL3 (GOWN DISPOSABLE)
GOWN STRL REUS W/TWL XL LVL3 (GOWN DISPOSABLE) ×4
HEMOSTAT SURGICEL 2X14 (HEMOSTASIS) IMPLANT
KIT BASIN OR (CUSTOM PROCEDURE TRAY) ×2 IMPLANT
KIT SUCTION CATH 14FR (SUCTIONS) IMPLANT
KIT TURNOVER KIT B (KITS) ×2 IMPLANT
NDL HYPO 25GX1X1/2 BEV (NEEDLE) ×1 IMPLANT
NDL SPNL 18GX3.5 QUINCKE PK (NEEDLE) ×1 IMPLANT
NEEDLE HYPO 25GX1X1/2 BEV (NEEDLE) ×2 IMPLANT
NEEDLE SPNL 18GX3.5 QUINCKE PK (NEEDLE) ×2 IMPLANT
NS IRRIG 1000ML POUR BTL (IV SOLUTION) ×5 IMPLANT
PACK CHEST (CUSTOM PROCEDURE TRAY) ×2 IMPLANT
PAD ARMBOARD 7.5X6 YLW CONV (MISCELLANEOUS) ×4 IMPLANT
POUCH ENDO CATCH II 15MM (MISCELLANEOUS) ×2 IMPLANT
POUCH SPECIMEN RETRIEVAL 10MM (ENDOMECHANICALS) IMPLANT
RELOAD STAPLE 35X2.5 WHT THIN (STAPLE) IMPLANT
RELOAD STAPLE 60 3.8 GOLD REG (STAPLE) IMPLANT
RELOAD STAPLE 60 4.1 GRN THCK (STAPLE) IMPLANT
RELOAD STAPLER GOLD 60MM (STAPLE) ×7 IMPLANT
RELOAD STAPLER GREEN 60MM (STAPLE) ×2 IMPLANT
SCISSORS ENDO CVD 5DCS (MISCELLANEOUS) IMPLANT
SEALANT PROGEL (MISCELLANEOUS) IMPLANT
SEALANT SURG COSEAL 4ML (VASCULAR PRODUCTS) IMPLANT
SEALANT SURG COSEAL 8ML (VASCULAR PRODUCTS) IMPLANT
SHEARS HARMONIC HDI 20CM (ELECTROSURGICAL) ×1 IMPLANT
SOLUTION ANTI FOG 6CC (MISCELLANEOUS) ×2 IMPLANT
SPECIMEN JAR MEDIUM (MISCELLANEOUS) ×1 IMPLANT
SPONGE INTESTINAL PEANUT (DISPOSABLE) ×2 IMPLANT
SPONGE TONSIL TAPE 1 RFD (DISPOSABLE) ×2 IMPLANT
STAPLE ECHEON FLEX 60 POW ENDO (STAPLE) ×1 IMPLANT
STAPLE RELOAD 2.5MM WHITE (STAPLE) ×6 IMPLANT
STAPLER RELOAD GOLD 60MM (STAPLE) ×14
STAPLER RELOAD GREEN 60MM (STAPLE) ×4
STAPLER VASCULAR ECHELON 35 (CUTTER) ×1 IMPLANT
SUT PROLENE 4 0 RB 1 (SUTURE)
SUT PROLENE 4-0 RB1 .5 CRCL 36 (SUTURE) IMPLANT
SUT SILK  1 MH (SUTURE) ×1
SUT SILK 1 MH (SUTURE) ×2 IMPLANT
SUT SILK 1 TIES 10X30 (SUTURE) ×1 IMPLANT
SUT SILK 2 0 SH (SUTURE) IMPLANT
SUT SILK 2 0SH CR/8 30 (SUTURE) IMPLANT
SUT SILK 3 0 SH 30 (SUTURE) IMPLANT
SUT SILK 3 0SH CR/8 30 (SUTURE) ×2 IMPLANT
SUT VIC AB 1 CTX 36 (SUTURE) ×1
SUT VIC AB 1 CTX36XBRD ANBCTR (SUTURE) ×1 IMPLANT
SUT VIC AB 2-0 CTX 36 (SUTURE) ×2 IMPLANT
SUT VIC AB 2-0 UR6 27 (SUTURE) ×1 IMPLANT
SUT VIC AB 3-0 MH 27 (SUTURE) IMPLANT
SUT VIC AB 3-0 X1 27 (SUTURE) ×2 IMPLANT
SUT VICRYL 2 TP 1 (SUTURE) IMPLANT
SYR 10ML LL (SYRINGE) ×1 IMPLANT
SYR 30ML LL (SYRINGE) ×2 IMPLANT
SYSTEM SAHARA CHEST DRAIN ATS (WOUND CARE) ×2 IMPLANT
TAPE CLOTH 4X10 WHT NS (GAUZE/BANDAGES/DRESSINGS) ×2 IMPLANT
TAPE CLOTH SURG 4X10 WHT LF (GAUZE/BANDAGES/DRESSINGS) ×1 IMPLANT
TIP APPLICATOR SPRAY EXTEND 16 (VASCULAR PRODUCTS) IMPLANT
TOWEL GREEN STERILE (TOWEL DISPOSABLE) ×2 IMPLANT
TOWEL GREEN STERILE FF (TOWEL DISPOSABLE) ×2 IMPLANT
TRAY FOLEY MTR SLVR 16FR STAT (SET/KITS/TRAYS/PACK) ×2 IMPLANT
TROCAR XCEL BLADELESS 5X75MML (TROCAR) ×2 IMPLANT
WATER STERILE IRR 1000ML POUR (IV SOLUTION) ×2 IMPLANT

## 2018-04-25 NOTE — Anesthesia Postprocedure Evaluation (Signed)
Anesthesia Post Note  Patient: [name]  Procedure(s) Performed: VIDEO ASSISTED THORACOSCOPY (VATS)LEFT LOWER LOBECTOMY, NODE DISSECTION (Left Chest)     Patient location during evaluation: PACU Anesthesia Type: General Level of consciousness: awake and alert Pain management: pain level controlled Vital Signs Assessment: post-procedure vital signs reviewed and stable Respiratory status: spontaneous breathing, nonlabored ventilation, respiratory function stable and patient connected to nasal cannula oxygen Cardiovascular status: blood pressure returned to baseline and stable Postop Assessment: no apparent nausea or vomiting Anesthetic complications: no    Last Vitals:  Vitals:   04/25/18 1439 04/25/18 1445  BP: (!) 161/78   Pulse: 77 74  Resp: 13 11  Temp:    SpO2: 100% 100%    Last Pain:  Vitals:   04/25/18 1430  PainSc: 2                  [place] Warr[place] S

## 2018-04-25 NOTE — Anesthesia Procedure Notes (Signed)
Anesthesia Procedure Image    

## 2018-04-25 NOTE — Op Note (Signed)
NAMESHYTERIA, LEWIS MEDICAL RECORD [id number] ACCOUNT [id number] DATE OF BIRTH:1950/07/31 FACILITY: [facility] LOCATION: [facility] PHYSICIAN:Mona Ayars Chaya Jan, MD  OPERATIVE REPORT  DATE OF PROCEDURE:  04/25/2018  PREOPERATIVE DIAGNOSIS:  Adenocarcinoma of left lower lobe, clinical stage IA (T1, N0).  POSTOPERATIVE DIAGNOSIS:  Adenocarcinoma of left lower lobe, clinical stage IA (T1, N0).  PROCEDURE:   Left video-assisted thoracoscopy, Thoracoscopic left lower lobectomy, Mediastinal lymph node dissection, Intercostal nerve blocks.  SURGEON:  Modesto Charon, MD  ASSISTANT:  Enid Cutter, PA  ANESTHESIA:  General.  FINDINGS:  Mass in the superior segment of left lower lobe.  Bronchial margin negative for tumor.  Multiple large but otherwise benign-appearing lymph nodes.  Small rim of left upper lobe taken with mass to ensure negative margin.  Incomplete fissure.  CLINICAL NOTE:  The patient is a 68 year old woman who is a lifelong nonsmoker.  She had presented with about 6 months of hemoptysis.  A CT of the chest showed a left lower lobe lung mass.  On PET CT, the mass was hypermetabolic without evidence of any  regional or distant metastases.  She was referred to Dr. Baltazar Apo.  He did navigational bronchoscopy. Biopsies showed adenocarcinoma.  She was referred for consideration for surgical resection.  The indications, risks, benefits, and alternatives were  discussed in detail with the patient.  She understood and accepted the risks and agreed to proceed.  OPERATIVE NOTE:  The patient was brought to the preoperative holding area on 04/25/2018.  Anesthesia placed a central line and an arterial blood pressure monitoring line.  She was taken to the operating room, anesthetized and intubated with a double-lumen  endotracheal tube.  A Foley catheter was placed.  Intravenous antibiotics were administered.  Sequential compression devices were placed on the calves for DVT  prophylaxis.  The left chest was prepped and draped in the usual sterile fashion.  Single-lung  ventilation of the right lung was initiated and was tolerated well throughout the procedure.  A timeout was performed.  A solution containing 20 mL of liposomal bupivacaine, 30 mL of 0.5% bupivacaine, and 50 mL of saline was prepared.  It was used for local at the incision sites as well as for the intercostal nerve blocks.  The site in the mid  axillary line in approximately the 7th interspace was infiltrated with the bupivacaine solution.  A small port-type incision was made, and a 5 mm port was inserted into the chest.  The thoracoscope was advanced into the chest.  There was good isolation  of the left lung.  There was no pleural effusion.  A 4 cm working incision was made in the 5th interspace anterolaterally.  No rib spreading was performed during the procedure.  The fissure was noted to be incomplete.  The lung was retracted anteriorly.   The mass was visible in the superior segment of the lower lobe posteriorly.  The inferior ligament was divided with the Harmonic scalpel.  A level 9 node was encountered.  All lymph nodes that were encountered during the dissection were removed and sent  as separate specimens for permanent pathology.  The pleural reflection was divided at the hilum posteriorly.  In doing so, level 7 nodes were identified and removed.  The pleural reflection was divided anteriorly as well, and the pleura overlying the  aortopulmonary window was opened and level 5 node was identified and removed.    Intercostal nerve blocks were performed from the third to the ninth interspaces.  The needle was  advanced from a posterior approach, and 8 mL of the bupivacaine solution was injected into each  interspace in a subpleural plane.  An Echelon 60 mm stapler with a gold cartridge was used to begin completion of the fissure.  This was begun anteriorly and carried to the hilum.  Two firings of the  stapler  separated the lingula from the lower lobe.  The inferior pulmonary vein then was dissected out, encircled, and divided with an endoscopic vascular stapler.  The dissection then was carried back to the fissure.  The level 11 node was removed, and there  was some bronchial arterial bleeding with this.  It was minor and controlled with the Harmonic scalpel.  The pulmonary artery was identified, and the dissection was begun over the pulmonary artery.  Dissection was relatively difficult in this area, and  only a small portion of the fissure could be divided at this point.  Part of the fissure then was divided from the posterior side, and the stapler was placed grossly 2 cm away from the mass which incorporated a portion of the upper lobe in the staple  line.  The dissection was carried back inferiorly.  The lower lobe bronchus was dissected out.  Once the plane was developed, the Echelon stapler with a Kobrin cartridge was placed across the lower lobe bronchus and closed.  A test inflation showed good  aeration of the upper lobe.  The stapler was fired, transecting the bronchus.  The basilar segmental portion of the pulmonary artery then was easily dissected out, encircled and divided with the vascular stapler. Due to angles with placement of the stapler when dividing the pulmonary artery, a second port incision was made anterior to the first. Next, the superior segmental branch was  identified, dissected out and divided with the vascular stapler.  The remainder of the fissure was completed with sequential firings of the Echelon stapler using a gold cartridge.  The specimen was placed into an endoscopic retrieval bag, removed and  sent for frozen section on the bronchial margin and the closest parenchymal margin, both of which returned negative for tumor.  The chest was copiously irrigated with warm saline.  A test inflation to 30 cm of water revealed no leakage from the bronchial  stump.    A  28-French chest tube was placed through the anterior port incision and secured with a #1 silk suture.  Dual-lung ventilation was resumed.  There was good inflation of the left upper lobe.  The thoracoscope was removed.  The posterior port incision was  closed in standard fashion, as was the working incision which was closed in 3 layers with a #1 Vicryl fascial suture followed by 2-0 Vicryl subcutaneous suture and 3-0 Vicryl subcuticular suture.  Dermabond was applied to the incisions.  The chest tube  was placed to Pleur-evac drainage.  She was placed back in the supine position.  She was extubated in the operating room and taken to the postanesthetic care unit in good condition.  All sponge, needle and instrument counts were correct at the end of the  procedure.  There were no intraoperative complications.  LN/NUANCE  D:04/25/2018 T:04/25/2018 JOB:[id number]

## 2018-04-25 NOTE — Anesthesia Procedure Notes (Addendum)
Procedure Name: Intubation Date/Time: 04/25/2018 9:03 AM Performed by: Renato Shin, CRNA Pre-anesthesia Checklist: Patient identified, Emergency Drugs available, Suction available and Patient being monitored Patient Re-evaluated:Patient Re-evaluated prior to induction Oxygen Delivery Method: Circle system utilized Preoxygenation: Pre-oxygenation with 100% oxygen Induction Type: IV induction Ventilation: Mask ventilation without difficulty Laryngoscope Size: Glidescope and 3 Endobronchial tube: Left, EBT position confirmed by auscultation, Double lumen EBT and EBT position confirmed by fiberoptic bronchoscope and 35 Fr Number of attempts: 3 Airway Equipment and Method: Rigid stylet,  Bougie stylet and Video-laryngoscopy Placement Confirmation: ETT inserted through vocal cords under direct vision,  positive ETCO2 and CO2 detector Secured at: 31 cm Tube secured with: Tape Dental Injury: Teeth and Oropharynx as per pre-operative assessment  Difficulty Due To: Difficulty was anticipated, Difficult Airway- due to anterior larynx and Difficult Airway- due to limited oral opening Future Recommendations: Recommend- induction with short-acting agent, and alternative techniques readily available Comments: Pt easy vent after IV induction. DL x1 Glidescope 3 blade. Glottic opening easily visualized, anterior location. DLT unable to pass through glottic opening due to angle of opening. DL x1 Mil 2, Grade 3 view with CP. Bougie passed with ease through glottic opening. DLT unable to pass over bougie. 7.0 ETT passes easily over bougie. Pt VSS, +ETCO2 via ETT. Cook catheter passed with ease through 7.0 ETT. ETT exchanged for 35Fr Left DLT over [facility] catheter; exchanged visualized with Glidescope. VSS, +ETCO2. DLT confirmed with fiberoptic visualization. Dentition remains unchanged. Pt remained easy to mask vent between intubation attempts.

## 2018-04-25 NOTE — Anesthesia Procedure Notes (Signed)
Central Venous Catheter Insertion Performed by: Myrtie Soman, MD, anesthesiologist Start/End1/11/2018 7:52 AM, 04/25/2018 8:04 AM Patient location: Pre-op. Preanesthetic checklist: patient identified, IV checked, site marked, risks and benefits discussed, surgical consent, monitors and equipment checked, pre-op evaluation, timeout performed and anesthesia consent Position: Trendelenburg Lidocaine 1% used for infiltration and patient sedated Hand hygiene performed , maximum sterile barriers used  and Seldinger technique used Catheter size: 8 Fr Total catheter length 16. Central line was placed.Double lumen Procedure performed using ultrasound guided technique. Ultrasound Notes:anatomy identified, needle tip was noted to be adjacent to the nerve/plexus identified, no ultrasound evidence of intravascular and/or intraneural injection and image(s) printed for medical record Attempts: 1 Following insertion, dressing applied, line sutured and Biopatch. Post procedure assessment: blood return through all ports  Patient tolerated the procedure well with no immediate complications.

## 2018-04-25 NOTE — Plan of Care (Signed)
  Problem: Education: Goal: Knowledge of disease or condition will improve Outcome: Progressing Goal: Knowledge of the prescribed therapeutic regimen will improve Outcome: Progressing   Problem: Cardiac: Goal: Will achieve and/or maintain hemodynamic stability Outcome: Progressing   Problem: Clinical Measurements: Goal: Postoperative complications will be avoided or minimized Outcome: Progressing   Problem: Respiratory: Goal: Respiratory status will improve Outcome: Progressing   Problem: Pain Management: Goal: Pain level will decrease Outcome: Progressing

## 2018-04-25 NOTE — Brief Op Note (Addendum)
04/25/2018  12:07 PM  PATIENT:  [name]  67 y.o. female  PRE-OPERATIVE DIAGNOSIS:  ADENOCARCINOMA OF LLL- CLINICAL stage IA (T1N0)  POST-OPERATIVE DIAGNOSIS:  ADENOCARCINOMA OF LLL-CLINICAL stage IA (T1N0)  PROCEDURE:   LEFT VATS THORACOSCOPIC LEFT LOWER LOBECTOMY MEDIASTINAL LYMPH NODE DISSECTION INTERCOSTAL NERVE BLOCKS  SURGEON:  Surgeon(s) and Role:    * [name], MD - Primary  PHYSICIAN ASSISTANT: M. Roddenberry, PA-C  ANESTHESIA:   general  EBL:  100 mL   BLOOD ADMINISTERED:none  DRAINS: one 26fr. left pleural tube   LOCAL MEDICATIONS USED:  BUPIVICAINE   SPECIMEN:  Left lower lung lobe, multiple lymph nodes  DISPOSITION OF SPECIMEN:  PATHOLOGY  COUNTS:  YES  DICTATION: .Dragon Dictation  PLAN OF CARE: Admit to inpatient   PATIENT DISPOSITION:  PACU - hemodynamically stable.   Delay start of Pharmacological VTE agent (>24hrs) due to surgical blood loss or risk of bleeding: no

## 2018-04-25 NOTE — Transfer of Care (Signed)
Immediate Anesthesia Transfer of Care Note  Patient: [name]  Procedure(s) Performed: VIDEO ASSISTED THORACOSCOPY (VATS)LEFT LOWER LOBECTOMY, NODE DISSECTION (Left Chest)  Patient Location: PACU  Anesthesia Type:General  Level of Consciousness: awake, alert  and patient cooperative  Airway & Oxygen Therapy: Patient Spontanous Breathing and Patient connected to face mask oxygen  Post-op Assessment: Report given to RN, Post -op Vital signs reviewed and stable and Patient moving all extremities X 4  Post vital signs: Reviewed and stable  Last Vitals:  Vitals Value Taken Time  BP 143/70 04/25/2018 12:21 PM  Temp    Pulse 72 04/25/2018 12:23 PM  Resp 12 04/25/2018 12:23 PM  SpO2 100 % 04/25/2018 12:23 PM  Vitals shown include unvalidated device data.  Last Pain:  Vitals:   04/25/18 0624  PainSc: 0-No pain      Patients Stated Pain Goal: 0 (36/68/15 9470)  Complications: No apparent anesthesia complications

## 2018-04-25 NOTE — Interval H&P Note (Signed)
History and Physical Interval Note:  PFTs OK Recent pulmonary infection resolved Ok to proceed  04/25/2018 8:09 AM  [name]  has presented today for surgery, with the diagnosis of ADENOCARCINOMA OF LLL  The various methods of treatment have been discussed with the patient and family. After consideration of risks, benefits and other options for treatment, the patient has consented to  Procedure(s): VIDEO ASSISTED THORACOSCOPY (VATS)LEFT LOWER LOBECTOMY (Left) as a surgical intervention .  The patient's history has been reviewed, patient examined, no change in status, stable for surgery.  I have reviewed the patient's chart and labs.  Questions were answered to the patient's satisfaction.     [name]

## 2018-04-25 NOTE — Anesthesia Preprocedure Evaluation (Addendum)
Anesthesia Evaluation  Patient identified by MRN, date of birth, ID band Patient awake    Reviewed: Allergy & Precautions, NPO status , Patient's Chart, lab work & pertinent test results  Airway Mallampati: II  TM Distance: >3 FB Neck ROM: Full    Dental no notable dental hx. (+) Dental Advisory Given, Teeth Intact   Pulmonary neg pulmonary ROS,    Pulmonary exam normal breath sounds clear to auscultation       Cardiovascular hypertension, Normal cardiovascular exam Rhythm:Regular Rate:Normal     Neuro/Psych negative neurological ROS  negative psych ROS   GI/Hepatic negative GI ROS, Neg liver ROS,   Endo/Other  negative endocrine ROS  Renal/GU negative Renal ROS  negative genitourinary   Musculoskeletal negative musculoskeletal ROS (+)   Abdominal   Peds negative pediatric ROS (+)  Hematology  (+) anemia ,   Anesthesia Other Findings   Reproductive/Obstetrics negative OB ROS                            Anesthesia Physical Anesthesia Plan  ASA: III  Anesthesia Plan: General   Post-op Pain Management:    Induction: Intravenous  PONV Risk Score and Plan: 3 and Ondansetron, Dexamethasone and Treatment may vary due to age or medical condition  Airway Management Planned: Double Lumen EBT  Additional Equipment: Arterial line and CVP  Intra-op Plan:   Post-operative Plan: Extubation in OR  Informed Consent: I have reviewed the patients History and Physical, chart, labs and discussed the procedure including the risks, benefits and alternatives for the proposed anesthesia with the patient or authorized representative who has indicated his/her understanding and acceptance.   Dental advisory given  Plan Discussed with: CRNA and Surgeon  Anesthesia Plan Comments:         Anesthesia Quick Evaluation

## 2018-04-25 NOTE — Anesthesia Procedure Notes (Signed)
Arterial Line Insertion Start/End1/11/2018 8:03 AM, 04/25/2018 8:10 AM Performed by: CRNA  Patient location: Pre-op. Preanesthetic checklist: patient identified, IV checked, site marked, risks and benefits discussed, surgical consent, monitors and equipment checked, pre-op evaluation, timeout performed and anesthesia consent Lidocaine 1% used for infiltration and patient sedated Left, radial was placed Catheter size: 20 G Hand hygiene performed , maximum sterile barriers used  and Seldinger technique used Allen's test indicative of satisfactory collateral circulation Attempts: 2 Procedure performed without using ultrasound guided technique. Following insertion, dressing applied and Biopatch. Post procedure assessment: normal  Post procedure complications: local hematoma. Patient tolerated the procedure well with no immediate complications.

## 2018-04-26 ENCOUNTER — Encounter (HOSPITAL_COMMUNITY): Payer: Self-pay | Admitting: Thoracic Surgery (Cardiothoracic Vascular Surgery)

## 2018-04-26 ENCOUNTER — Inpatient Hospital Stay (HOSPITAL_COMMUNITY): Payer: Medicare Other

## 2018-04-26 LAB — BASIC METABOLIC PANEL
Anion gap: 11 (ref 5–15)
BUN: 9 mg/dL (ref 8–23)
CO2: 23 mmol/L (ref 22–32)
Calcium: 8.8 mg/dL — ABNORMAL LOW (ref 8.9–10.3)
Chloride: 100 mmol/L (ref 98–111)
Creatinine, Ser: 0.71 mg/dL (ref 0.44–1.00)
Glucose, Bld: 122 mg/dL — ABNORMAL HIGH (ref 70–99)
Potassium: 4 mmol/L (ref 3.5–5.1)
Sodium: 134 mmol/L — ABNORMAL LOW (ref 135–145)

## 2018-04-26 LAB — CBC
HCT: 34 % — ABNORMAL LOW (ref 36.0–46.0)
Hemoglobin: 11 g/dL — ABNORMAL LOW (ref 12.0–15.0)
MCH: 30.2 pg (ref 26.0–34.0)
MCHC: 32.4 g/dL (ref 30.0–36.0)
MCV: 93.4 fL (ref 80.0–100.0)
Platelets: 300 10*3/uL (ref 150–400)
RBC: 3.64 MIL/uL — ABNORMAL LOW (ref 3.87–5.11)
RDW: 12.1 % (ref 11.5–15.5)
WBC: 12.5 10*3/uL — ABNORMAL HIGH (ref 4.0–10.5)
nRBC: 0 % (ref 0.0–0.2)

## 2018-04-26 LAB — GLUCOSE, CAPILLARY
Glucose-Capillary: 114 mg/dL — ABNORMAL HIGH (ref 70–99)
Glucose-Capillary: 116 mg/dL — ABNORMAL HIGH (ref 70–99)
Glucose-Capillary: 122 mg/dL — ABNORMAL HIGH (ref 70–99)
Glucose-Capillary: 130 mg/dL — ABNORMAL HIGH (ref 70–99)
Glucose-Capillary: 130 mg/dL — ABNORMAL HIGH (ref 70–99)
Glucose-Capillary: 135 mg/dL — ABNORMAL HIGH (ref 70–99)
Glucose-Capillary: 200 mg/dL — ABNORMAL HIGH (ref 70–99)

## 2018-04-26 LAB — BLOOD GAS, ARTERIAL
Acid-Base Excess: 0.8 mmol/L (ref 0.0–2.0)
Bicarbonate: 23.9 mmol/L (ref 20.0–28.0)
O2 Saturation: 97.7 %
Patient temperature: 98.6
pCO2 arterial: 31.7 mmHg — ABNORMAL LOW (ref 32.0–48.0)
pH, Arterial: 7.49 — ABNORMAL HIGH (ref 7.350–7.450)
pO2, Arterial: 97.8 mmHg (ref 83.0–108.0)

## 2018-04-26 MED ORDER — OXYCODONE HCL 5 MG/5ML PO SOLN
5.0000 mg | ORAL | Status: DC | PRN
  Administered 2018-04-26 (×2): 7.5 mg via ORAL
  Administered 2018-04-26: 10 mg via ORAL
  Administered 2018-04-26 – 2018-04-27 (×3): 5 mg via ORAL
  Filled 2018-04-26 (×2): qty 10
  Filled 2018-04-26: qty 5
  Filled 2018-04-26 (×3): qty 10

## 2018-04-26 MED ORDER — ALBUTEROL SULFATE (2.5 MG/3ML) 0.083% IN NEBU
2.5000 mg | INHALATION_SOLUTION | Freq: Four times a day (QID) | RESPIRATORY_TRACT | Status: DC | PRN
  Administered 2018-04-27: 2.5 mg via RESPIRATORY_TRACT
  Filled 2018-04-26 (×2): qty 3

## 2018-04-26 NOTE — Progress Notes (Addendum)
Patient ambulated approx 90 ft in hallway with multiple "breaks" in between. Pt insisted that her husband follow along with a chair as she felt she "may not make it" Pt is complaining of 10/10 pain in the right shoulder although she is refusing to use her PCA at this time. After ambulation pt positioned back in chair. Ice pack provided for patients shoulder. At that time pt stated that she would press her fentanyl pca button only if this RN agreed to stay with her for 20 minutes afterwards to assure safety. This RN complied. Pt had no reaction during the 20 minutes. Rn exited room. After a few moments pts son came to the desk and stated that she was having a reaction breaking out in a rash. This RN entered room to find pt flush in the right cheek area. Pt did not c/o any difficulty breathing. No rash anywhere else on body only on the right cheekbone area. Pt anxious. RN paged provide to make aware. Half of a Dose of benadryl given per patient request "rash" area seems to be going away. Will monitor.

## 2018-04-26 NOTE — Progress Notes (Addendum)
[address]       [place] [address]             [phone]      1 Day Post-Op Procedure(s) (LRB): VIDEO ASSISTED THORACOSCOPY (VATS)LEFT LOWER LOBECTOMY, NODE DISSECTION (Left)   Subjective:  Patient with multiple complaints.  She states that she is having pain especially in her shoulder region.  However, she has not taken any pain medication as she is fearful that she will have a reaction to it.  She states that she was unable to tolerate Vancomycin after developing swelling in her throat, and nasal congestion.  She took Benadryl which relieved symptoms.  She has not yet walked.  She is tolerating oral liquid, but has not taken solid foods.  The patient was encouraged to utilize pain medications as if she is in a lot of pain it will be difficult for her to breath effectively and she needs to be able to get up an ambulate.  Objective: Vital signs in last 24 hours: Temp:  [97 F (36.1 C)-98.6 F (37 C)] 98 F (36.7 C) (01/09 0750) Pulse Rate:  [67-107] 89 (01/09 0750) Cardiac Rhythm: Normal sinus rhythm (01/09 0700) Resp:  [8-26] 22 (01/09 0750) BP: (132-194)/(61-100) 136/78 (01/09 0750) SpO2:  [96 %-100 %] 97 % (01/09 0750) Arterial Line BP: (147-184)/(71-90) 153/85 (01/08 2000)  Intake/Output from previous day: 01/08 0701 - 01/09 0700 In: 1600 [I.V.:1400] Out: 1100 [Urine:740; Blood:100; Chest Tube:260]  General appearance: alert, cooperative and no distress Heart: regular rate and rhythm Lungs: clear to auscultation bilaterally Abdomen: soft, non-tender; bowel sounds normal; no masses,  no organomegaly Extremities: extremities normal, atraumatic, no cyanosis or edema Wound: clean and dry  Lab Results: Recent Labs    04/24/18 1306 04/26/18 0500  WBC 8.8 12.5*  HGB 11.7* 11.0*  HCT 36.4 34.0*  PLT 282 300   BMET:  Recent Labs    04/24/18 1306 04/26/18 0500  NA 134* 134*  K 4.2 4.0  CL 103 100  CO2 20* 23  GLUCOSE 135* 122*  BUN 16  9  CREATININE 0.88 0.71  CALCIUM 9.3 8.8*    PT/INR:  Recent Labs    04/24/18 1306  LABPROT 13.1  INR 1.00   ABG    Component Value Date/Time   PHART 7.490 (H) 04/26/2018 0535   HCO3 23.9 04/26/2018 0535   ACIDBASEDEF 0.4 04/24/2018 1306   O2SAT 97.7 04/26/2018 0535   CBG (last 3)  Recent Labs    04/26/18 0030 04/26/18 0505 04/26/18 0748  GLUCAP 200* 122* 130*    Assessment/Plan: S/P Procedure(s) (LRB): VIDEO ASSISTED THORACOSCOPY (VATS)LEFT LOWER LOBECTOMY, NODE DISSECTION (Left)  1. Chest tube- + air leak, reinforced dressing, CXR with mild sub q air and trace apical pneumothorax- will leave on suction today 2. CV- NSR, + Hypertension, likely pain related as she is not taking any medications despite 10/10 pain, will monitor and if remains elevated will require anti-hypertensive agent 3. Pulm- off oxygen, continue pulm toilet 4. Pain control- continue fentanyl PCA, transitioned oxy to oral regimen, patient was agreeable to give medication a try after further speaking with her about pain management 5. Arixtra for DVT prophylaxis 6. IV fluids to KVO 7. Dispo- patient stable, pain is not controlled, however she has not been willing to take pain medications, will try one this morning, leave chest tube on suction due to air leak, watch BP if doesn't improve will need antihypertensive agent, repeat CXR  in AM   LOS: 1 day    Ellwood Handler 04/26/2018 Patient seen and examined, agree with above Ambulate, IS  Remo Lipps C. Roxan Hockey, MD [facility] [phone]

## 2018-04-26 NOTE — Progress Notes (Signed)
Pt refusing to ambulate this morning due to "benadryl still being in her system" RN encouraged patient to ambulate. Patient states she will try in about an hour to give the benadryl more time to get out of her system. Will continue to encourage patient to ambulate.

## 2018-04-26 NOTE — Progress Notes (Signed)
Nutrition Brief Note  RD consulted for assessment due to food preferences and multiple food intolerances (pt requesting RD consult).   Wt Readings from Last 15 Encounters:  04/25/18 65.7 kg  04/24/18 65.7 kg  04/17/18 66.4 kg  03/29/18 67.2 kg  03/07/18 67.7 kg  03/06/18 67.7 kg  03/01/18 67.1 kg   [name]  has presented for surgery, with the diagnosis of ADENOCARCINOMA OF LLL   1/8- s/p PROCEDURE:  LEFT VATS; THORACOSCOPIC LEFT LOWER LOBECTOMY; MEDIASTINAL LYMPH NODE DISSECTION; INTERCOSTAL NERVE BLOCKS  Reviewed I/O's: +500 ml x 24 hours  Chest tube output: 260 ml x 24 hours  Case discussed with RN prior to visit. Pt requesting RD consult due to multiple food intolerances. Pt very upset with meal tray, as gravy was provided on her [place] and she was afraid this had milk ingredients in it.   Pt provided RN with a list of medications and allergies prior to admission. Reviewed this information with pt and two sons at bedside; pt confirms that these are not true allergies, but preferences and/or intolerances (lactose intolerant; no dairy products from cow including milk, butter cream, or cheese (yogurt, goat cheese, and feta cheese ok); cilantro; aspartame; avocado; fresh tarragon; pork and pork products; MSG; iodine; cayenne pepper).   Pt with good appetite, but expresses frustration over meal trays. She is requesting RD consult and only feels comfortable with this RD ordering meals. She consumed most of her breakfast and received a new lunch tray during RD visit. Pt son ordered pt dinner of grilled chicken breast, vegetable soup, whole rice without gravy, italian rice, and hot water.  Reviewed menu items and obtained meal orders through Monday, January 04/30/18 (when pt anticipates discharge). Also gave pt permission for family to bring outside foods if desired.   Meals orders inputted are as follows:  Friday, 04/27/18 Breakfast: hard boiled egg, grits, wheat toast, jelly,  decaf coffee, soy milk, salt and pepper Lunch: baked cod, coleslaw, lemon italian ice, unsweetened ice tea, white rice without gravy Dinner: roast beef, capri mixed vegetables, cherry italian ice, veggie broth, hot tea  Saturday 04/28/18 Breakfast: grits, hard boiled egg, wheat toast, jelly, soy milk, salt, pepper Lunch: white rice without gravy, Krahn beans, grilled chicken breast, cherry [place] ice Dinner: [place] breast without gravy, white white without gravy, broccoli, lemon italian ice, salt, pepper  Sunday 04/29/18 Breakfast: boiled egg, wheat toast, jello, hot tea, grits, soy milk, salt, pepper Lunch: white rice, cabbage, grilled chicken, lemon [place] ice Dinner: BBQ chicken, roasted potatoes, carrots, [place] ice  Monday 04/30/18 Breakfast: hard boiled egg, grits, wheat toast, jelly, salt, pepper, soy milk Lunch: beef pot roast, white rice without gravy, [place] vegetable blend, cherry italian ice  Pt denies weight loss. Noted pt has experienced a 2.2% x 1 month over the past month, which is not significant for time frame.  Nutrition-Focused physical exam completed. Findings are no fat depletion, no muscle depletion, and no edema.   Labs reviewed: Na: 134.  Body mass index is 24.87 kg/m. Patient meets criteria for normal weight range based on current BMI.   Current diet order is regular, patient is consuming approximately 100% of meals at this time. Labs and medications reviewed.   No nutrition interventions warranted at this time. If nutrition issues arise, please consult RD.   Candelario Steppe A. Jimmye Norman, RD, LDN, CDE Pager: [phone] After hours Pager: [phone]

## 2018-04-27 ENCOUNTER — Inpatient Hospital Stay (HOSPITAL_COMMUNITY): Payer: Medicare Other

## 2018-04-27 LAB — COMPREHENSIVE METABOLIC PANEL
ALT: 24 U/L (ref 0–44)
AST: 24 U/L (ref 15–41)
Albumin: 3.3 g/dL — ABNORMAL LOW (ref 3.5–5.0)
Alkaline Phosphatase: 40 U/L (ref 38–126)
Anion gap: 8 (ref 5–15)
BUN: 14 mg/dL (ref 8–23)
CO2: 27 mmol/L (ref 22–32)
Calcium: 8.7 mg/dL — ABNORMAL LOW (ref 8.9–10.3)
Chloride: 102 mmol/L (ref 98–111)
Creatinine, Ser: 0.89 mg/dL (ref 0.44–1.00)
GFR calc Af Amer: >60 mL/min (ref >60)
GFR calc non Af Amer: >60 mL/min (ref >60)
Glucose, Bld: 123 mg/dL — ABNORMAL HIGH (ref 70–99)
Potassium: 3.9 mmol/L (ref 3.5–5.1)
Sodium: 137 mmol/L (ref 135–145)
Total Bilirubin: 0.8 mg/dL (ref 0.3–1.2)
Total Protein: 5.8 g/dL — ABNORMAL LOW (ref 6.5–8.1)

## 2018-04-27 LAB — CBC
HCT: 33.2 % — ABNORMAL LOW (ref 36.0–46.0)
Hemoglobin: 10.9 g/dL — ABNORMAL LOW (ref 12.0–15.0)
MCH: 31.5 pg (ref 26.0–34.0)
MCHC: 32.8 g/dL (ref 30.0–36.0)
MCV: 96 fL (ref 80.0–100.0)
Platelets: 284 10*3/uL (ref 150–400)
RBC: 3.46 MIL/uL — ABNORMAL LOW (ref 3.87–5.11)
RDW: 12.6 % (ref 11.5–15.5)
WBC: 13.1 10*3/uL — ABNORMAL HIGH (ref 4.0–10.5)
nRBC: 0 % (ref 0.0–0.2)

## 2018-04-27 MED ORDER — DIPHENHYDRAMINE HCL 12.5 MG/5ML PO ELIX
12.5000 mg | ORAL_SOLUTION | Freq: Four times a day (QID) | ORAL | Status: DC | PRN
  Administered 2018-04-27 – 2018-04-28 (×3): 12.5 mg via ORAL
  Administered 2018-04-28: 25 mg via ORAL
  Administered 2018-04-28 – 2018-04-29 (×2): 12.5 mg via ORAL
  Administered 2018-04-29: 25 mg via ORAL
  Filled 2018-04-27: qty 5
  Filled 2018-04-27 (×7): qty 10
  Filled 2018-04-27: qty 5
  Filled 2018-04-27 (×5): qty 10

## 2018-04-27 MED ORDER — HYDROMORPHONE HCL 1 MG/ML PO LIQD: 1.0000 mg | ORAL | Status: DC | PRN

## 2018-04-27 MED ORDER — HYDROMORPHONE HCL 1 MG/ML PO LIQD
1.0000 mg | ORAL | Status: DC | PRN
  Administered 2018-04-27: 0.5 mg via ORAL
  Administered 2018-04-28 – 2018-04-29 (×5): 1 mg via ORAL
  Administered 2018-04-30: 0.5 mg via ORAL
  Administered 2018-04-30 – 2018-05-01 (×3): 1 mg via ORAL
  Filled 2018-04-27 (×11): qty 1

## 2018-04-27 MED ORDER — HYDROMORPHONE HCL 2 MG PO TABS: 1.0000 mg | ORAL_TABLET | ORAL | Status: DC | PRN

## 2018-04-27 NOTE — Progress Notes (Signed)
      [address]       [place] [address]             [phone]      2 Days Post-Op Procedure(s) (LRB): VIDEO ASSISTED THORACOSCOPY (VATS)LEFT LOWER LOBECTOMY, NODE DISSECTION (Left)   Subjective:  Patient states she had a horrible night.  She states she has been up since 2.  She had issues with nursing overnight, not administering medications quickly enough and using Chlorahexadine despite her having an allergy.  She continues to have pain, but states it has been better.  She is concerned that she is developing a rash with oxycodone.  Objective: Vital signs in last 24 hours: Temp:  [98 F (36.7 C)-98.7 F (37.1 C)] 98.7 F (37.1 C) (01/10 0518) Pulse Rate:  [89-105] 105 (01/10 0518) Cardiac Rhythm: Sinus tachycardia (01/10 0700) Resp:  [15-22] 20 (01/10 0518) BP: (136-151)/(68-80) 151/68 (01/10 0518) SpO2:  [95 %-99 %] 98 % (01/10 0518)  Intake/Output from previous day: 01/09 0701 - 01/10 0700 In: 720 [P.O.:720] Out: 2220 [Urine:2000; Chest Tube:220]  General appearance: alert, cooperative and no distress Heart: regular rate and rhythm Lungs: clear to auscultation bilaterally Abdomen: soft, non-tender; bowel sounds normal; no masses,  no organomegaly Extremities: extremities normal, atraumatic, no cyanosis or edema Wound: clean and dry  Lab Results: Recent Labs    04/26/18 0500 04/27/18 0525  WBC 12.5* 13.1*  HGB 11.0* 10.9*  HCT 34.0* 33.2*  PLT 300 284   BMET:  Recent Labs    04/26/18 0500 04/27/18 0525  NA 134* 137  K 4.0 3.9  CL 100 102  CO2 23 27  GLUCOSE 122* 123*  BUN 9 14  CREATININE 0.71 0.89  CALCIUM 8.8* 8.7*    PT/INR:  Recent Labs    04/24/18 1306  LABPROT 13.1  INR 1.00   ABG    Component Value Date/Time   PHART 7.490 (H) 04/26/2018 0535   HCO3 23.9 04/26/2018 0535   ACIDBASEDEF 0.4 04/24/2018 1306   O2SAT 97.7 04/26/2018 0535   CBG (last 3)  Recent Labs    04/26/18 1638 04/26/18 1925 04/26/18 2255    GLUCAP 114* 130* 116*    Assessment/Plan: S/P Procedure(s) (LRB): VIDEO ASSISTED THORACOSCOPY (VATS)LEFT LOWER LOBECTOMY, NODE DISSECTION (Left)  1. Chest tube- + air leak stable, sub q air remains present- leave on suction today, repeat CXR in AM 2. CV- NSR, BP improved 3. Pulm- off oxygen, continue aggressive IS, will continue nebs 4. Pain control- reaction with fentanyl yesterday, PCA has been discontinued, relief with Oxy but she states she has been having itching which she thinks might be related to her Oxy, she will attempt this morning, if reaction will transition to Dilaudid as she has taken Morphine without issue before 5. Arixtra for DVT prophylaxis 6. Dispo- patient stable, chest tube on suction, repeat CXR in AM, monitor drug reactions with Oxy   LOS: 2 days    Ellwood Handler 04/27/2018

## 2018-04-27 NOTE — Plan of Care (Addendum)
Patient ambulated x2 on room air, utilizing front wheel walker with RN. Tolerated well.   Pt c/o pain is not controlled well, stated pain is generally at a 3-mostly in left shoulder area, spikes to 10 when coughing. RN explained this is normal s/p surgery. Pt request RN call Junie Panning, PA to change medication. VORBV: '1mg'$  oral liquid dilaudid, Q3hours PRN, for severe pain per Ellwood Handler, PA. Allergy warning verified by Pilar Plate, [facility]. Awaiting medication from pharmacy.   Using alcohol wipes to clean hub of central line--pt states she is severely allergic to chlorhexadine (CHG) of any kind.   Pt stated she has had a "good day today" and is appreciative of the nursing/tech care she has received.    Problem: Education: Goal: Knowledge of the prescribed therapeutic regimen will improve Outcome: Progressing   Problem: Activity: Goal: Risk for activity intolerance will decrease Outcome: Progressing   Problem: Cardiac: Goal: Will achieve and/or maintain hemodynamic stability Outcome: Completed/Met   Problem: Respiratory: Goal: Respiratory status will improve Outcome: Progressing   Problem: Education: Goal: Knowledge of General Education information will improve Description Including pain rating scale, medication(s)/side effects and non-pharmacologic comfort measures Outcome: Adequate for Discharge   Problem: Health Behavior/Discharge Planning: Goal: Ability to manage health-related needs will improve Outcome: Progressing   Problem: Nutrition: Goal: Adequate nutrition will be maintained Outcome: Completed/Met

## 2018-04-27 NOTE — Progress Notes (Addendum)
Pt refusing to allow staff to use CHG wipes to clean port on IJ due to allergy. Pt educated on the importantance of CHG wipes. Alcohol wipes placed in patients room and sign place on door per pt request. Will pass information to on day shift RN.

## 2018-04-27 NOTE — Care Management Important Message (Signed)
Important Message  Patient Details  Name: [name] MRN: [id number] Date of Birth: 09-13-1950   Medicare Important Message Given:  Yes    Yanis Juma P Vanessa Alesi 04/27/2018, 11:45 AM

## 2018-04-27 NOTE — Discharge Summary (Addendum)
Physician Discharge Summary  Patient ID: [name] MRN: [id number] DOB/AGE: 1951/02/14 68 y.o.  Admit date: 04/25/2018 Discharge date: 05/16/2018  PCP:  Lillette Boxer, MD .  Admission Diagnoses: Adenocarcinoma left lower lobe, Clinical Stage IA (T1,N0)  Patient Active Problem List   Diagnosis Date Noted  . Jacobs/P lobectomy of lung, left lower lobectomy 04/25/2018  . Acute bronchitis 04/05/2018  . Adenocarcinoma, lung ([place]) 03/01/2018   Discharge Diagnoses: Adenocarcinoma left lower lobe, Pathologic Stage IB(T2N0)  Patient Active Problem List   Diagnosis Date Noted  . Jacobs/P lobectomy of lung, left lower lobectomy 04/25/2018  . Acute bronchitis 04/05/2018  . 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 She is a lifelong nonsmoker. She has been having small amounts of hemoptysis for almost 6 months. She had an ENT evaluation that was negative. More recently in October she had an increase in her cough and more hemoptysis, although still relatively small amounts. She had further evaluation including a chest CT which showed a left lower lobe lung mass. On PET the mass was hypermetabolic, there was no regional or distant metastatic disease. Dr. Alfred Levins is a relative of her husband. She was referred to Dr. Lamonte Sakai. He did a navigational bronchoscopy which revealed adenocarcinoma. It was felt she would require surgical evaluation and she was referred to Dr. Roxan Hockey.  At that visit she denies wheezing or shortness of breath with routine activities.  She denied chest pressure or tightness.  It was felt she would benefit from VATS procedure with lobectomy.  The risks and benefits of the procedure were explained to the patient and she was agreeable to proceed.    Hospital Course:    Mrs. Deblasi presented to [facility] on 04/25/2018.  She was taken to the operating room and underwent Left VATS with left lower lobectomy, mediastinal lymph node dissection, intercostal nerve block.  She tolerated the procedure without difficulty, was extubated and taken to the PACU in stable condition.  The patient had multiple issues during her hospital stay.  She developed an allergic reaction to Vancomycin stating it caused a rash on her face.  Her symptoms relieved with the cessation of the medication and benadryl.  She had difficulty with pain management.  She initially did not use medication.  She tried fentanyl and reacted poorly to it.  She tried Oxycodone with relief of symptoms.  Her chest tube exhibited an air leak.  CXR showed evidence of sub q air along the left chest wall.  Her chest tube transitioned to water seal on 05/01/2018.  Unfortunately her air leak worsened and she developed worsening of sub q emphysema.  Her lung remained expanded on CXR but did require placement back on suction.  Her sub q air improved, with resolution of air leak.  Her chest tube was again transitioned to water seal on 05/08/2018.  CXR remained stable and her chest tube was removed on 05/09/2018.  Follow up CXR showed no evidence of significant pneumothorax.  There was increase in sub a emphysema.  It was felt the patient would require placement of Interbronchial valves.  This was done in the operating room on 05/10/2018.  She also had a left pigtail catheter placed.  She tolerated the procedure without difficulty.  Her chest tube was left on water seal.  CXR  showed development of a pneumothorax.  Her chest tube was placed back on suction on 05/12/2018.  Follow up CXR showed improvement of pneumothorax. The air leak resolved and after a 24 hour clamp trial, the pigtail catheter was removed.  Follow up CXR remained stable with a small apical air space. She maintained  O2 saturations 95-100% on RA.  Significant  Diagnostic Studies: PET/ PATHOLOGY  1. 2.5 cm transfissural left upper lobe/left lower lobe lesion is hypermetabolic and consistent with neoplasm. 2. No PET-CT findings for mediastinal/hilar adenopathy or metastatic disease elsewhere. 3. Incidental brown fat and muscular uptake  1. Lung, resection (segmental or lobe), left lower - ADENOCARCINOMA, 4.3 CM. - FOCAL VISCERAL PLEURAL INVOLVEMENT (PL2). - MARGINS NOT INVOLVED. 2. Lymph node, biopsy, Level 9 - ANTHRACOTIC LYMPH NODE. - NO EVIDENCE OF MALIGNANCY. 3. Lymph node, biopsy, Level 9 #2 - ANTHRACOTIC LYMPH NODE. - NO EVIDENCE OF MALIGNANCY. 4. Lymph node, biopsy, Level 7 - ANTHRACOTIC LYMPH NODE. - NO EVIDENCE OF MALIGNANCY. 5. Lymph node, biopsy, Level 5 - ANTHRACOTIC LYMPH NODE. - NO EVIDENCE OF MALIGNANCY. 6. Lymph node, biopsy, Level 10 - ANTHRACOTIC LYMPH NODE. - NO EVIDENCE OF MALIGNANCY. 7. Lymph node, biopsy, Level 11 - ANTHRACOTIC LYMPH NODE. - NO EVIDENCE OF MALIGNANCY. 8. Lymph node, biopsy, Level 11 #2 - ANTHRACOTIC LYMPH NODE. - NO EVIDENCE OF MALIGNANCY. 9. Lymph node, biopsy, Level 7 #2 - ANTHRACOTIC LYMPH NODE. - NO EVIDENCE OF MALIGNANCY. 10. Lymph node, biopsy, Level 12 - ANTHRACOTIC LYMPH NODE. 1 of 3 FINAL for [name], [name] ([id number]) Diagnosis(continued) - NO EVIDENCE OF MALIGNANCY. 11. Lymph node, biopsy, Level 12 #2 - ANTHRACOTIC LYMPH NODE. - NO EVIDENCE OF MALIGNANCY. 12. Lymph node, biopsy, Level 9 #3 - ANTHRACOTIC LYMPH NODE. - NO EVIDENCE OF MALIGNANCY.  Treatments: surgery:  04/25/18  Left video-assisted thoracoscopy, thoracoscopic left lower lobectomy, mediastinal lymph node dissection, intercostal nerve block.  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  Discharge Exam: Blood pressure 99/76, pulse (!) 107, temperature 98.7 F (37.1 C), temperature source Oral, resp. rate (!) 23,  height 5\' 4"  (1.626 m), weight 65.7 kg, SpO2 98 %. General appearance: alert and no distress Resp: Breath sounds on the right are clear, diminished on the left. No wheezes, creckles.. Chest wall: no tenderness Cardio: regular rate and rhythm Incision/Wound: The anterior pigtail catheter site is covered with a dry dressing. The left thoracotomy site is well approximated, clean and dry.   Disposition: Home  Discharge Medications:   Allergies as of 05/16/2018      Reactions   Influenza Vaccines Shortness Of Breath   Couldn't breathe   Penicillins Hives, Other (See Comments)   DID THE REACTION INVOLVE: Swelling of the face/tongue/throat, SOB, or low BP? Unknown Sudden or severe rash/hives, skin peeling, or the inside of the mouth or nose? Unknown Did it require medical treatment? #  #  #  YES  #  #  #  When did it last happen? Teenager If all above answers are "NO", may proceed with cephalosporin use.   Proton Pump Inhibitors Anaphylaxis   Sulfites Anaphylaxis, Swelling, Other (See Comments)   Turn red and throat swells   Wasp Venom Protein Swelling   Aspartame Other (See Comments)   Severe diarrhea   Avocado Other (See Comments)   Chest tightness, nauseous   Cefaclor Hives   Chlorhexidine Other (See Comments)   Dizziness, issues with use on skin as burns her  skin up   Chloroxine Other (See Comments)   Severe migraine, chemical cleaning products   Chocolate Other (See Comments)   Acne   Ciprofloxacin Hcl Hives   Clarithromycin Hives   Gatifloxacin Hives   Ivp Dye [iodinated Diagnostic Agents] Other (See Comments)   Acne   Ketorolac Other (See Comments)   Extreme dizziness and fatigue   Lactase Other (See Comments)   Unknown   Molds & Smuts Other (See Comments)   Unknown   Nexium [esomeprazole Magnesium] Other (See Comments)   Throat spasms   Other Other (See Comments)   Grass and Trees, Maple, [place], [place], [place], [place], [place], Rag weed, [place], Pollen, Dog and cats,  Technical sales engineer and Down. ALL ANTIBIOTICS CAUSE HIVES EXCEPT Z-PAKS   Pork-derived Products Other (See Comments)   religious   Prednisolone Other (See Comments)   Extreme dizziness and fatigue   Shellfish Allergy Other (See Comments)   Acne   Soap Other (See Comments)   Soft soap- severe dermatitis   Soft Soap  Brand name   Sulfa Antibiotics Hives   Sulfamethoxazole-trimethoprim Hives   Zantac [ranitidine Hcl] Other (See Comments)   Severe throat spasms      Medication List    STOP taking these medications   benzonatate 200 MG capsule Commonly known as:  TESSALON     TAKE these medications   beta carotene 25000 UNIT capsule Take 25,000 Units by mouth every other day.   CoQ10 200 MG Caps Take 200 mg by mouth daily.   EMERGEN-C IMMUNE PO Take 1 packet by mouth daily as needed (for immune system).   EPIPEN 2-PAK 0.3 mg/0.3 mL Soaj injection Generic drug:  EPINEPHrine Inject 0.3 mg into the muscle once.   METHYLFOLATE PO Place 800 mcg under the tongue every other day.   Omega-3 1000 MG Caps Take 2,000 mg by mouth daily.   PATANASE 0.6 % Soln Generic drug:  Olopatadine HCl Place 1-2 sprays into the nose 2 (two) times daily as needed (for congestion).   PROBIOTIC PO Take 1 capsule by mouth daily.   Riboflavin 100 MG Caps Take 200 mg by mouth every other day.   rizatriptan 10 MG tablet Commonly known as:  MAXALT Take 5 mg by mouth as needed for migraine. May repeat in 2 hours if needed   rosuvastatin 5 MG tablet Commonly known as:  CRESTOR Take 5 mg by mouth daily.   sucralfate 1 g tablet Commonly known as:  CARAFATE Take 1 g by mouth See admin instructions. Crush and drink 1 g by mouth 2-3 times daily   traMADol 50 MG tablet Commonly known as:  ULTRAM Take 1 tablet (50 mg total) by mouth every 6 (six) hours as needed for severe pain.   vitamin A 8000 UNIT capsule Take 8,000 Units by mouth every other day.   vitamin B-12 1000 MCG tablet Commonly known as:   CYANOCOBALAMIN Take 1,000 mcg by mouth every other day.   Vitamin D3 50 MCG (2000 UT) Tabs Take 4,000 Units by mouth daily.      Follow-up Information    [name], MD Follow up on 05/14/2018.   Specialty:  Cardiothoracic Surgery Why:  Appointment is at 4:15, please get CXR at 3:45 at [place] located on first floor of our office building Contact information: [address] [place] [address] [phone]           Signed: Antony Odea 05/16/2018, 11:48 AM

## 2018-04-27 NOTE — Discharge Instructions (Signed)
Discharge Instructions:  1. You may shower, please wash incisions daily with soap and water and keep dry.  If you wish to cover wounds with dressing you may do so but please keep clean and change daily.  No tub baths or swimming until incisions have completely healed.  If your incisions become red or develop any drainage please call our office at [phone]  2. No Driving until cleared by Dr. Leonarda Salon office and you are no longer using narcotic pain medications  3. Fever of 101.5 for at least 24 hours with no source, please contact our office at [phone]  4. You may cough up small amounts of blood tinged sputum.  This is normal unless amounts are more than 2 TBSP and are bright red in color.  5. Activity- up as tolerated, please walk at least 3 times per day.  Avoid strenuous activity   6. If any questions or concerns arise, please do not hesitate to contact our office at [phone]

## 2018-04-28 ENCOUNTER — Inpatient Hospital Stay (HOSPITAL_COMMUNITY): Payer: Medicare Other

## 2018-04-28 NOTE — Progress Notes (Addendum)
      [address]       [place] [address]             [phone]      3 Days Post-Op Procedure(s) (LRB): VIDEO ASSISTED THORACOSCOPY (VATS)LEFT LOWER LOBECTOMY, NODE DISSECTION (Left) Subjective: Patient states that she slept well last night and plans to try and only take tylenol during the day so she can be active.   Objective: Vital signs in last 24 hours: Temp:  [98.3 F (36.8 C)-100.3 F (37.9 C)] 98.5 F (36.9 C) (01/11 0811) Pulse Rate:  [89-116] 94 (01/11 0811) Cardiac Rhythm: Normal sinus rhythm (01/11 0739) Resp:  [13-24] 13 (01/11 0811) BP: (119-131)/(54-75) 119/66 (01/11 0811) SpO2:  [94 %-98 %] 94 % (01/11 0811)     Intake/Output from previous day: 01/10 0701 - 01/11 0700 In: 360 [P.O.:360] Out: 250 [Chest Tube:250] Intake/Output this shift: Total I/O In: 240 [P.O.:240] Out: 0   General appearance: alert, cooperative and no distress Heart: regular rate and rhythm, S1, S2 normal, no murmur, click, rub or gallop Lungs: clear to auscultation bilaterally Abdomen: soft, non-tender; bowel sounds normal; no masses,  no organomegaly Extremities: extremities normal, atraumatic, no cyanosis or edema Wound: clean and dry  Lab Results: Recent Labs    04/26/18 0500 04/27/18 0525  WBC 12.5* 13.1*  HGB 11.0* 10.9*  HCT 34.0* 33.2*  PLT 300 284   BMET:  Recent Labs    04/26/18 0500 04/27/18 0525  NA 134* 137  K 4.0 3.9  CL 100 102  CO2 23 27  GLUCOSE 122* 123*  BUN 9 14  CREATININE 0.71 0.89  CALCIUM 8.8* 8.7*    PT/INR: No results for input(s): LABPROT, INR in the last 72 hours. ABG    Component Value Date/Time   PHART 7.490 (H) 04/26/2018 0535   HCO3 23.9 04/26/2018 0535   ACIDBASEDEF 0.4 04/24/2018 1306   O2SAT 97.7 04/26/2018 0535   CBG (last 3)  Recent Labs    04/26/18 1638 04/26/18 1925 04/26/18 2255  GLUCAP 114* 130* 116*    Assessment/Plan: S/P Procedure(s) (LRB): VIDEO ASSISTED THORACOSCOPY (VATS)LEFT  LOWER LOBECTOMY, NODE DISSECTION (Left)  1. Chest tube + air leak. Sub q air remains the same if not better today. Leave on suction. CXR today showed no definite pneumothorax and stable chest tube placement. CXR ordered for tomorrow.  2. NSR in the 90s, BP well controlled.  3. Pain well controlled today. Switched to PO tylenol and IV Dilaudid. No reaction.  4. Arixtra for DVT prophylaxis 5. Blood glucose well controlled 6. Creatinine stable 0.89, electrolytes ok 7. H and H 10.9/33.2, stable. Expected acute blood loss anemia  Plan: Keep chest tube to suction. Change chest tube dressing this morning since its saturated. Dressed with Vaseline gauze and sterile 4 x 4 dressing. Patient concerned about pink outline around suture-does not look infected. Will continue to monitor closely.    LOS: 3 days    Elgie Collard 04/28/2018 Patient seen and examined, agree with above Still has an air leak but improved- will leave on suction today Probably try water seal tomorrow  Remo Lipps C. Roxan Hockey, MD [facility] [phone]

## 2018-04-29 ENCOUNTER — Inpatient Hospital Stay (HOSPITAL_COMMUNITY): Payer: Medicare Other

## 2018-04-29 NOTE — Progress Notes (Addendum)
      [address]       [place] [address]             [phone]      4 Days Post-Op Procedure(s) (LRB): VIDEO ASSISTED THORACOSCOPY (VATS)LEFT LOWER LOBECTOMY, NODE DISSECTION (Left) Subjective: Some more pain in her left breast and left shoulder which is better now after tylenol.   Objective: Vital signs in last 24 hours: Temp:  [97.3 F (36.3 C)-98.5 F (36.9 C)] 97.3 F (36.3 C) (01/12 0337) Pulse Rate:  [83-125] 87 (01/12 0515) Cardiac Rhythm: Normal sinus rhythm (01/12 0735) Resp:  [13-24] 19 (01/12 0515) BP: (119-162)/(57-80) 131/69 (01/12 0337) SpO2:  [94 %-100 %] 98 % (01/12 0515)     Intake/Output from previous day: 01/11 0701 - 01/12 0700 In: 1080 [P.O.:1080] Out: 220 [Chest Tube:220] Intake/Output this shift: No intake/output data recorded.  General appearance: alert, cooperative and no distress Heart: regular rate and rhythm, S1, S2 normal, no murmur, click, rub or gallop Lungs: clear to auscultation bilaterally Abdomen: soft, non-tender; bowel sounds normal; no masses,  no organomegaly Extremities: extremities normal, atraumatic, no cyanosis or edema Wound: clean and dry  Lab Results: Recent Labs    04/27/18 0525  WBC 13.1*  HGB 10.9*  HCT 33.2*  PLT 284   BMET:  Recent Labs    04/27/18 0525  NA 137  K 3.9  CL 102  CO2 27  GLUCOSE 123*  BUN 14  CREATININE 0.89  CALCIUM 8.7*    PT/INR: No results for input(s): LABPROT, INR in the last 72 hours. ABG    Component Value Date/Time   PHART 7.490 (H) 04/26/2018 0535   HCO3 23.9 04/26/2018 0535   ACIDBASEDEF 0.4 04/24/2018 1306   O2SAT 97.7 04/26/2018 0535   CBG (last 3)  Recent Labs    04/26/18 1638 04/26/18 1925 04/26/18 2255  GLUCAP 114* 130* 116*    Assessment/Plan: S/P Procedure(s) (LRB): VIDEO ASSISTED THORACOSCOPY (VATS)LEFT LOWER LOBECTOMY, NODE DISSECTION (Left)  1. Chest tube + air leak. Sub q air worse this morning. Leave on suction. There was a  small kink in the chest tube near the chest wall.  2. NSR in the 90s, BP well controlled.  3. Pain well controlled today. On PO tylenol and IV Dilaudid.  4. Arixtra for DVT prophylaxis 5. Blood glucose well controlled 6. Creatinine stable 0.89, electrolytes ok 7. H and H 10.9/33.2, stable. Expected acute blood loss anemia  Plan: Subq air worse today. Discussed with nursing and patient to constantly check the tube to ensure there are no further kinks in the line especially when moving from the bed to the chair ect. Keep on suction. Pain is well controlled with tylenol.    LOS: 4 days    [name] 04/29/2018 Patient seen and examined, agree with above Has a very small air leak, will leave tube on suction today Probably try water seal tomorrow  Remo Lipps C. Roxan Hockey, MD [facility] [phone]

## 2018-04-30 ENCOUNTER — Inpatient Hospital Stay (HOSPITAL_COMMUNITY): Payer: Medicare Other

## 2018-04-30 MED ORDER — ZOLPIDEM TARTRATE 5 MG PO TABS
2.5000 mg | ORAL_TABLET | Freq: Every evening | ORAL | Status: DC | PRN
  Administered 2018-04-30: 5 mg via ORAL
  Filled 2018-04-30: qty 1

## 2018-04-30 MED ORDER — DIAZEPAM 2 MG PO TABS: 2.0000 mg | ORAL_TABLET | Freq: Four times a day (QID) | ORAL | Status: DC | PRN

## 2018-04-30 MED ORDER — SALINE SPRAY 0.65 % NA SOLN
1.0000 | NASAL | Status: DC | PRN
  Filled 2018-04-30 (×2): qty 44

## 2018-04-30 NOTE — Plan of Care (Signed)
  Problem: Education: Goal: Knowledge of disease or condition will improve Outcome: Progressing Goal: Knowledge of the prescribed therapeutic regimen will improve Outcome: Progressing   Problem: Activity: Goal: Risk for activity intolerance will decrease Outcome: Progressing   Problem: Respiratory: Goal: Respiratory status will improve Outcome: Progressing   Problem: Skin Integrity: Goal: Wound healing without signs and symptoms infection will improve Outcome: Progressing   Problem: Education: Goal: Knowledge of General Education information will improve Description Including pain rating scale, medication(s)/side effects and non-pharmacologic comfort measures Outcome: Progressing   Problem: Health Behavior/Discharge Planning: Goal: Ability to manage health-related needs will improve Outcome: Progressing   Problem: Clinical Measurements: Goal: Ability to maintain clinical measurements within normal limits will improve Outcome: Progressing Goal: Will remain free from infection Outcome: Progressing Goal: Diagnostic test results will improve Outcome: Progressing Goal: Respiratory complications will improve Outcome: Progressing Goal: Cardiovascular complication will be avoided Outcome: Progressing   Problem: Activity: Goal: Risk for activity intolerance will decrease Outcome: Progressing   Problem: Coping: Goal: Level of anxiety will decrease Outcome: Progressing   Problem: Pain Managment: Goal: General experience of comfort will improve Outcome: Progressing   Problem: Skin Integrity: Goal: Risk for impaired skin integrity will decrease Outcome: Progressing

## 2018-04-30 NOTE — Progress Notes (Addendum)
      [address]       [place] [address]             [phone]        5 Days Post-Op Procedure(s) (LRB): VIDEO ASSISTED THORACOSCOPY (VATS)LEFT LOWER LOBECTOMY, NODE DISSECTION (Left)   Subjective:  Patient again with multiple complaints.  She is extremely anxious about her central line being removed.  She is also nervous about her respiratory status being compromised with medications and transitioning of chest tubes to water seal.  She is requesting something to help her sleep as she is not sleeping much.  Objective: Vital signs in last 24 hours: Temp:  [98 F (36.7 C)-98.8 F (37.1 C)] 98.8 F (37.1 C) (01/13 0730) Pulse Rate:  [90-100] 91 (01/12 2137) Cardiac Rhythm: Sinus tachycardia (01/13 0730) Resp:  [15-23] 23 (01/13 0730) BP: (130-166)/(61-87) 157/87 (01/13 0730) SpO2:  [96 %-100 %] 96 % (01/13 0645)  Intake/Output from previous day: 01/12 0701 - 01/13 0700 In: 960 [P.O.:960] Out: 260 [Chest Tube:260]  General appearance: alert, cooperative and no distress Heart: regular rate and rhythm Lungs: clear to auscultation bilaterally Abdomen: soft, non-tender; bowel sounds normal; no masses,  no organomegaly Extremities: extremities normal, atraumatic, no cyanosis or edema Wound: clean and dry, sub q air along left chest  Lab Results: No results for input(s): WBC, HGB, HCT, PLT in the last 72 hours. BMET: No results for input(s): NA, K, CL, CO2, GLUCOSE, BUN, CREATININE, CALCIUM in the last 72 hours.  PT/INR: No results for input(s): LABPROT, INR in the last 72 hours. ABG    Component Value Date/Time   PHART 7.490 (H) 04/26/2018 0535   HCO3 23.9 04/26/2018 0535   ACIDBASEDEF 0.4 04/24/2018 1306   O2SAT 97.7 04/26/2018 0535   CBG (last 3)  No results for input(s): GLUCAP in the last 72 hours.  Assessment/Plan: S/P Procedure(s) (LRB): VIDEO ASSISTED THORACOSCOPY (VATS)LEFT LOWER LOBECTOMY, NODE DISSECTION (Left)  1. Chest tube- + 1 air  leak with cough, continued sub q air on left side, CXR without pneumothorax, will likely transition to water seal today 2. Pulm- not on oxygen, complains of dry nose, will add saline drops, continue IS 3. Pain control- well achieved with Dilaudid and Tylenol 4. Anxiety- ordered Valium per patient request as she takes this at home with relief 5. Arixra for DVT prophylaxis 6. Dispo- patient stable, continues to have anxiety with being in hospital and use of medication, have made adjustments, chest tube to water seal, repeat CXR in AM   LOS: 5 days    Erin Barrett 04/30/2018 Minimal air leak- will try on water seal  Remo Lipps C. Roxan Hockey, MD [facility] [phone]

## 2018-04-30 NOTE — Plan of Care (Signed)
Problem: Elimination: Goal: Will not experience complications related to bowel motility Outcome: Completed/Met Goal: Will not experience complications related to urinary retention Outcome: Completed/Met  Problem: Activity:  Goal: Risk for activity intolerance will decrease Outcome: Progressing   Problem: Clinical Measurements: Goal: Postoperative complications will be avoided or minimized Outcome: Progressing   Problem: Skin Integrity: Goal: Wound healing without signs and symptoms infection will improve Outcome: Progressing   Problem: Coping: Goal: Level of anxiety will decrease Outcome: Progressing

## 2018-04-30 NOTE — Progress Notes (Addendum)
Brief Nutrition Follow-Up Note  Chart reviewed. Pt remains in hospital secondary to chest tube air leak.   Appetite remains good; meal completion 100%. Observed breakfast meal tray, of which pt consumed 100%.  Spoke with pt and husband at bedside, who reports meals have been coming per her preferences. She expressed appreciation for RD assistance, however, reports frustration with lack of choices due to multiple restrictions. She did express understanding. Family members have also been bringing in favorite foods to supplement.   No plans for discharge today. RD ordered following meals for pt based upon preference. Per pt and husband, likely discharge on Thursday.  04/30/18 Dinner:rotisserie chicken quarter, fresh [place] beans, steak fries with ketchup, unsweetened ice tea  05/01/18: Breakfast: grits with salt and pepper, wheat toast with jelly, hard boiled egg, hot tea, soy milk Lunch: balsamic grilled chicken, white rice with no gravy, asparagus, cherry italian ice, unsweetened ice tea Dinner: grilled chicken, white rice, [place] beans, lemon italian ice   05/02/18 Breakfast: grits with salt and pepper, wheat toast with jelly, hard boiled egg, hot tea, soy milk Lunch: spaghetti with meat sauce, broccoli, italian ice, unsweetened tea Dinner: grilled chicken breakfast, steak fries with ketchup, carrots, unsweetened tea, italian ice  05/03/18 Breakfast: grits with salt and pepper, wheat toast with jelly, hard boiled egg, hot tea, soy milk  RD will continue to order meals from hospital stay per pt request.   Jania Steinke A. Jimmye Norman, RD, LDN, CDE Pager: [phone] After hours Pager: [phone]

## 2018-05-01 ENCOUNTER — Inpatient Hospital Stay (HOSPITAL_COMMUNITY): Payer: Medicare Other

## 2018-05-01 MED ORDER — ACETAMINOPHEN 160 MG/5ML PO SOLN
1000.0000 mg | Freq: Four times a day (QID) | ORAL | Status: DC | PRN
  Administered 2018-05-01 – 2018-05-13 (×17): 1000 mg via ORAL
  Filled 2018-05-01 (×19): qty 40.6

## 2018-05-01 MED ORDER — ROSUVASTATIN CALCIUM 5 MG PO TABS
5.0000 mg | ORAL_TABLET | Freq: Every evening | ORAL | Status: DC
  Administered 2018-05-02 – 2018-05-15 (×12): 5 mg via ORAL
  Filled 2018-05-01 (×17): qty 1

## 2018-05-01 NOTE — Plan of Care (Signed)
  Problem: Education: Goal: Knowledge of disease or condition will improve Outcome: Progressing Goal: Knowledge of the prescribed therapeutic regimen will improve Outcome: Progressing   Problem: Activity: Goal: Risk for activity intolerance will decrease Outcome: Progressing   Problem: Clinical Measurements: Goal: Postoperative complications will be avoided or minimized Outcome: Progressing   Problem: Respiratory: Goal: Respiratory status will improve Outcome: Progressing   Problem: Skin Integrity: Goal: Wound healing without signs and symptoms infection will improve Outcome: Progressing   Problem: Education: Goal: Knowledge of General Education information will improve Description Including pain rating scale, medication(s)/side effects and non-pharmacologic comfort measures Outcome: Progressing   Problem: Health Behavior/Discharge Planning: Goal: Ability to manage health-related needs will improve Outcome: Progressing   Problem: Clinical Measurements: Goal: Ability to maintain clinical measurements within normal limits will improve Outcome: Progressing Goal: Will remain free from infection Outcome: Progressing Goal: Diagnostic test results will improve Outcome: Progressing Goal: Respiratory complications will improve Outcome: Progressing Goal: Cardiovascular complication will be avoided Outcome: Progressing   Problem: Activity: Goal: Risk for activity intolerance will decrease Outcome: Progressing   Problem: Coping: Goal: Level of anxiety will decrease Outcome: Progressing   Problem: Pain Managment: Goal: General experience of comfort will improve Outcome: Progressing   Problem: Skin Integrity: Goal: Risk for impaired skin integrity will decrease Outcome: Progressing

## 2018-05-01 NOTE — Care Management Important Message (Signed)
Important Message  Patient Details  Name: [name] MRN: [id number] Date of Birth: Sep 05, 1950   Medicare Important Message Given:  Yes    Imaya Duffy P Marion Rosenberry 05/01/2018, 4:35 PM

## 2018-05-01 NOTE — Progress Notes (Signed)
      [address]       [place] [address]             [phone]      6 Days Post-Op Procedure(s) (LRB): VIDEO ASSISTED THORACOSCOPY (VATS)LEFT LOWER LOBECTOMY, NODE DISSECTION (Left)   Subjective:  Continues to have multiple complaints/concerns. She continues to not sleep.  She is not getting much to eat stating they keep saying she cant have things for her dietary restrictions.  Her sub q air is worse which makes her very nervous that we will take her chest tubes out too soon.  Objective: Vital signs in last 24 hours: Temp:  [98.1 F (36.7 C)-98.8 F (37.1 C)] 98.7 F (37.1 C) (01/14 0726) Pulse Rate:  [95-103] 98 (01/14 0726) Cardiac Rhythm: Normal sinus rhythm (01/14 0529) Resp:  [15-23] 16 (01/14 0726) BP: (151-160)/(67-85) 152/85 (01/14 0726) SpO2:  [93 %-99 %] 97 % (01/14 0726)  Intake/Output from previous day: 01/13 0701 - 01/14 0700 In: -  Out: 890 [Urine:600; Chest Tube:290] Intake/Output this shift: Total I/O In: -  Out: 66 [Chest Tube:66]  General appearance: alert, cooperative and no distress Heart: regular rate and rhythm Lungs: clear to auscultation bilaterally Abdomen: soft, non-tender; bowel sounds normal; no masses,  no organomegaly Extremities: extremities normal, atraumatic, no cyanosis or edema Wound: clean and dry  Lab Results: No results for input(s): WBC, HGB, HCT, PLT in the last 72 hours. BMET: No results for input(s): NA, K, CL, CO2, GLUCOSE, BUN, CREATININE, CALCIUM in the last 72 hours.  PT/INR: No results for input(s): LABPROT, INR in the last 72 hours. ABG    Component Value Date/Time   PHART 7.490 (H) 04/26/2018 0535   HCO3 23.9 04/26/2018 0535   ACIDBASEDEF 0.4 04/24/2018 1306   O2SAT 97.7 04/26/2018 0535   CBG (last 3)  No results for input(s): GLUCAP in the last 72 hours.  Assessment/Plan: S/P Procedure(s) (LRB): VIDEO ASSISTED THORACOSCOPY (VATS)LEFT LOWER LOBECTOMY, NODE DISSECTION (Left)  1. Chest  tube- + air leak, worsened today... CXR with increase in sub q air along left side- leave chest tube in place today 2. CV- NSR, BP is elevated- again likely due to patient's anxiety/pain management 3. Insomnia- will attempt full dose Ambien this evening, will adjust vitals schedule so she does not get disturbed overnight 4. Arixtra for DVT prophylaxis 5. Dispo- patient with increase in sub q air/air leak this morning, leave chest tube in place, continue IS, dietary adjustments placed, will adjust vitals schedule, repeat CXR in AM   LOS: 6 days    Ellwood Handler 05/01/2018

## 2018-05-02 ENCOUNTER — Inpatient Hospital Stay (HOSPITAL_COMMUNITY): Payer: Medicare Other

## 2018-05-02 NOTE — Progress Notes (Signed)
Brief Nutrition Follow-Up Note  Case discussed with RN, who reports pt's appetite remains good. Pt family has also been bringing in foods, due to restrictions from preferences and multiple food intolerances.   RD continues to order meals for pt per her request. Meal orders are as follows:  05/03/18: Lunch: roast [place] breast, carrots, white rice with no gravycherry [place] ice, unsweetened ice tea Dinner: grilled chicken, steak fries, whole bean blend, lemon italian ice, unsweetened tea  05/04/18 Breakfast: grits with salt and pepper, wheat toast with jelly, hard boiled egg, hot tea, soy milk Lunch: baked cod, cole slaw, white rice without gravy, italian ice, unsweetened tea Dinner: roast beef, red potatoes, capri vegetables, unsweetened tea, italian ice  05/05/18 Breakfast: grits with salt and pepper, wheat toast with jelly, hard boiled egg, hot tea, soy milk Lunch: grilled chicken, [place] beans, white rice without gravy, italian ice, unsweetened tea Dinner: roast [place], breadstick, steamed spinach, side salad with thousand island dressing unsweetened tea, [place] ice  05/06/18 Breakfast: grits with salt and pepper, wheat toast with jelly, hard boiled egg, hot tea, soy milk Lunch: steamed cabbage, white rice without gravy chicken breast, unsweetened tea Dinner: BBQ chicken, potato, carrots unsweetened tea, italian ice  05/07/18 Breakfast: grits with salt and pepper, wheat toast with jelly, hard boiled egg, hot tea, soy milk Lunch: pot roast, [place] vegetable blend, steak fries italian ice, unsweetened tea Dinner: rotisserie chicken quarter, baked potato, [place] beans, unsweetened tea, italian ice  RD will continue to order meals from hospital stay per pt request.   Nixie Laube A. Jimmye Norman, RD, LDN, CDE Pager: [phone] After hours Pager: [phone]

## 2018-05-02 NOTE — Progress Notes (Signed)
Pt c/o swelling in neck and states she feels like it is harder to breathe.  VSS.  O2 sats 99-100, oxygen 1L placed on pt for comfort. Pt refused any type of medication.  CXR done.  Paged Dr. Servando Snare for change in status.  Waiting call back.  Will continue to monitor pt.

## 2018-05-02 NOTE — Progress Notes (Addendum)
      [address]       [place] [address]             [phone]      7 Days Post-Op Procedure(s) (LRB): VIDEO ASSISTED THORACOSCOPY (VATS)LEFT LOWER LOBECTOMY, NODE DISSECTION (Left)   Subjective:  Patient is upset this morning.  She states that her swelling and crackling has gotten much worse.  This caused her to be unable to sleep and she states she cant breath when she lays flat.  She wants to know what we are going to do to help her breath and be able to sleep at night.  Objective: Vital signs in last 24 hours: Temp:  [97.6 F (36.4 C)-98.7 F (37.1 C)] 98.1 F (36.7 C) (01/15 0300) Pulse Rate:  [97-108] 107 (01/15 0558) Cardiac Rhythm: Normal sinus rhythm (01/15 0700) Resp:  [16-25] 17 (01/15 0558) BP: (117-161)/(49-92) 161/80 (01/15 0558) SpO2:  [96 %-100 %] 100 % (01/15 0558)  Intake/Output from previous day: 01/14 0701 - 01/15 0700 In: 600 [P.O.:600] Out: 562 [Urine:300; Chest Tube:262]  General appearance: alert, cooperative and no distress Heart: regular rate and rhythm Lungs: diminished breath sounds, worse on left Abdomen: soft, non-tender; bowel sounds normal; no masses,  no organomegaly Extremities: extremities normal, atraumatic, no cyanosis or edema Wound: clean and dry  Lab Results: No results for input(s): WBC, HGB, HCT, PLT in the last 72 hours. BMET: No results for input(s): NA, K, CL, CO2, GLUCOSE, BUN, CREATININE, CALCIUM in the last 72 hours.  PT/INR: No results for input(s): LABPROT, INR in the last 72 hours. ABG    Component Value Date/Time   PHART 7.490 (H) 04/26/2018 0535   HCO3 23.9 04/26/2018 0535   ACIDBASEDEF 0.4 04/24/2018 1306   O2SAT 97.7 04/26/2018 0535   CBG (last 3)  No results for input(s): GLUCAP in the last 72 hours.  Assessment/Plan: S/P Procedure(s) (LRB): VIDEO ASSISTED THORACOSCOPY (VATS)LEFT LOWER LOBECTOMY, NODE DISSECTION (Left)  1. Chest tube- on water seal, air leak has resolved, however  significant extension of sub q air now into face and along patients right side, will place chest tube back on suction 2. CV- hemodynamically stable, BP remains high at times, likely due to anxiety and pain  3. Pulm- on oxygen at 1L, sats are above 96%, CXR with extension of sub q air, patient feeling short of breath likely has some component of anxiety attributing to this, continue IS 4. Arixtra for DVT prophylaxis 5. Dispo- patients air leak has resolved, extension of sub q air into face and now on right side, patient states this is making her short of breath and she is unable to lay flat, will place chest tube back to suction, repeat CXR in AM   LOS: 7 days    Ellwood Handler 05/02/2018 Patient seen and examined, agree with above SQ emphysema worsened overnight Will put CT back to suction and observe  Remo Lipps C. Roxan Hockey, MD [facility] [phone]

## 2018-05-03 ENCOUNTER — Other Ambulatory Visit: Payer: Self-pay | Admitting: *Deleted

## 2018-05-03 ENCOUNTER — Inpatient Hospital Stay (HOSPITAL_COMMUNITY): Payer: Medicare Other

## 2018-05-03 NOTE — Progress Notes (Addendum)
      [address]       [place] [address]             [phone]      8 Days Post-Op Procedure(s) (LRB): VIDEO ASSISTED THORACOSCOPY (VATS)LEFT LOWER LOBECTOMY, NODE DISSECTION (Left)   Subjective:  Ms. Hagwood had a better night last night.  She was able to sleep.  She feels her swelling has improved some.    Objective: Vital signs in last 24 hours: Temp:  [98.2 F (36.8 C)-98.9 F (37.2 C)] 98.9 F (37.2 C) (01/15 1900) Pulse Rate:  [97-103] 97 (01/15 1900) Cardiac Rhythm: Normal sinus rhythm (01/15 2333) Resp:  [15-19] 19 (01/15 1900) BP: (122-149)/(44-74) 122/44 (01/15 1900) SpO2:  [99 %-100 %] 100 % (01/15 1900)  Intake/Output from previous day: 01/15 0701 - 01/16 0700 In: -  Out: 140 [Chest Tube:140]  General appearance: alert, cooperative and no distress Heart: regular rate and rhythm Lungs: clear to auscultation bilaterally Abdomen: soft, non-tender; bowel sounds normal; no masses,  no organomegaly Extremities: extremities normal, atraumatic, no cyanosis or edema Wound: clean and dry  Lab Results: No results for input(s): WBC, HGB, HCT, PLT in the last 72 hours. BMET: No results for input(s): NA, K, CL, CO2, GLUCOSE, BUN, CREATININE, CALCIUM in the last 72 hours.  PT/INR: No results for input(s): LABPROT, INR in the last 72 hours. ABG    Component Value Date/Time   PHART 7.490 (H) 04/26/2018 0535   HCO3 23.9 04/26/2018 0535   ACIDBASEDEF 0.4 04/24/2018 1306   O2SAT 97.7 04/26/2018 0535   CBG (last 3)  No results for input(s): GLUCAP in the last 72 hours.  Assessment/Plan: S/P Procedure(s) (LRB): VIDEO ASSISTED THORACOSCOPY (VATS)LEFT LOWER LOBECTOMY, NODE DISSECTION (Left)  1. Chest tube- very intermittent 1+ air leak- sub q air is improved on exam, CXR appears stable in appearance, will leave chest tube on suction today 2. CV- hemodynamically stable 3. Pulm- off oxygen currently, continue IS 4. Arixtra for DVT prophylaxis 5.  Dispo- patient stable, sub q air improved on exam, stable appearance of CXR, will leave chest tube on suction today, repeat CXR in AM   LOS: 8 days    Ellwood Handler 05/03/2018 Patient seen and examined, agree with above Small intermittent air leak- keep tube to suction today  Remo Lipps C. Roxan Hockey, MD [facility] [phone]

## 2018-05-03 NOTE — Clinical Social Work Note (Signed)
CSW acknowledges consult "Pt and family would like list of nurses for care at hs." Will notify RNCM in progression meeting.  CSW signing off. Consult again if any social work needs arise.  Dayton Scrape, [place]

## 2018-05-03 NOTE — Progress Notes (Signed)
Claimed that she had no bm for 3 days but refused dulcolax. Ordered prune juice , Took some with no result yet. Continue to monitor.

## 2018-05-03 NOTE — Progress Notes (Signed)
The proposed treatment discussed in cancer conference 05/03/2018 is for discussion purpose only and is not a binding recommendation.  The patient was not physically examine nor present for their treatment options.  Therefore, final treatment plans cannot be decided.

## 2018-05-04 ENCOUNTER — Inpatient Hospital Stay (HOSPITAL_COMMUNITY): Payer: Medicare Other

## 2018-05-04 NOTE — Progress Notes (Addendum)
      [address]       [place] [address]             [phone]      9 Days Post-Op Procedure(s) (LRB): VIDEO ASSISTED THORACOSCOPY (VATS)LEFT LOWER LOBECTOMY, NODE DISSECTION (Left)   Subjective:  Patient didn't sleep as well last night.  Felt short of breath had to wear oxygen.  She thinks her swelling has improved some.  Objective: Vital signs in last 24 hours: Temp:  [98.1 F (36.7 C)-98.9 F (37.2 C)] 98.1 F (36.7 C) (01/17 0616) Pulse Rate:  [97-112] 105 (01/17 0758) Cardiac Rhythm: Normal sinus rhythm;Sinus tachycardia (01/16 2015) Resp:  [18-28] 18 (01/17 0758) BP: (117-152)/(50-81) 128/63 (01/17 0758) SpO2:  [95 %-98 %] 97 % (01/17 0758)  Intake/Output from previous day: 01/16 0701 - 01/17 0700 In: 360 [P.O.:360] Out: 280 [Urine:100; Chest Tube:180]  General appearance: alert, cooperative and no distress Heart: regular rate and rhythm Lungs: clear to auscultation bilaterally Abdomen: soft, non-tender; bowel sounds normal; no masses,  no organomegaly Extremities: extremities normal, atraumatic, no cyanosis or edema Wound: clean and dry  Lab Results: No results for input(s): WBC, HGB, HCT, PLT in the last 72 hours. BMET: No results for input(s): NA, K, CL, CO2, GLUCOSE, BUN, CREATININE, CALCIUM in the last 72 hours.  PT/INR: No results for input(s): LABPROT, INR in the last 72 hours. ABG    Component Value Date/Time   PHART 7.490 (H) 04/26/2018 0535   HCO3 23.9 04/26/2018 0535   ACIDBASEDEF 0.4 04/24/2018 1306   O2SAT 97.7 04/26/2018 0535   CBG (last 3)  No results for input(s): GLUCAP in the last 72 hours.  Assessment/Plan: S/P Procedure(s) (LRB): VIDEO ASSISTED THORACOSCOPY (VATS)LEFT LOWER LOBECTOMY, NODE DISSECTION (Left)  1. Chest tube- Intermittent air leak with cough, CXR remains stable with sub q air- leave on suction 2. Pulm- no acute issues, continue IS 3. CV- hemodynamically stable 4. Arixtra for DVT prophylaxis 5.  Dispo- patient stable, intermittent air leak, leave chest tube on suction today, repeat CXR in AM   LOS: 9 days    Erin Barrett 05/04/2018 Patient seen and examined, agree with above Still with a small intermittent air leak  Remo Lipps C. Roxan Hockey, MD [facility] [phone]

## 2018-05-04 NOTE — Plan of Care (Signed)
  Problem: Education: Goal: Knowledge of disease or condition will improve Outcome: Progressing Goal: Knowledge of the prescribed therapeutic regimen will improve Outcome: Progressing   Problem: Activity: Goal: Risk for activity intolerance will decrease Outcome: Progressing   Problem: Clinical Measurements: Goal: Postoperative complications will be avoided or minimized Outcome: Progressing   Problem: Respiratory: Goal: Respiratory status will improve Outcome: Progressing   Problem: Skin Integrity: Goal: Wound healing without signs and symptoms infection will improve Outcome: Progressing   Problem: Education: Goal: Knowledge of General Education information will improve Description Including pain rating scale, medication(s)/side effects and non-pharmacologic comfort measures Outcome: Progressing   Problem: Health Behavior/Discharge Planning: Goal: Ability to manage health-related needs will improve Outcome: Progressing   Problem: Clinical Measurements: Goal: Ability to maintain clinical measurements within normal limits will improve Outcome: Progressing Goal: Will remain free from infection Outcome: Progressing Goal: Diagnostic test results will improve Outcome: Progressing Goal: Respiratory complications will improve Outcome: Progressing Goal: Cardiovascular complication will be avoided Outcome: Progressing   Problem: Activity: Goal: Risk for activity intolerance will decrease Outcome: Progressing   Problem: Coping: Goal: Level of anxiety will decrease Outcome: Progressing   Problem: Pain Managment: Goal: General experience of comfort will improve Outcome: Progressing   Problem: Skin Integrity: Goal: Risk for impaired skin integrity will decrease Outcome: Progressing

## 2018-05-04 NOTE — Care Management Important Message (Signed)
Important Message  Patient Details  Name: [name] MRN: [id number] Date of Birth: [birth year]   Medicare Important Message Given:  Yes    Donya Hitch P Matther Labell 05/04/2018, 4:41 PM

## 2018-05-05 ENCOUNTER — Inpatient Hospital Stay (HOSPITAL_COMMUNITY): Payer: Medicare Other

## 2018-05-05 NOTE — Progress Notes (Addendum)
[address]       [place] [address]             [phone]      10 Days Post-Op Procedure(s) (LRB): VIDEO ASSISTED THORACOSCOPY (VATS)LEFT LOWER LOBECTOMY, NODE DISSECTION (Left) Subjective: Feeling pretty well. I see no air leak  Objective: Vital signs in last 24 hours: Temp:  [97.9 F (36.6 C)-98.2 F (36.8 C)] 98.2 F (36.8 C) (01/18 0630) Pulse Rate:  [95-96] 96 (01/17 2207) Cardiac Rhythm: Normal sinus rhythm (01/18 0730) Resp:  [14-22] 17 (01/18 0630) BP: (114-148)/(73-98) 147/82 (01/18 0630) SpO2:  [98 %-99 %] 99 % (01/18 0630)  Hemodynamic parameters for last 24 hours:    Intake/Output from previous day: 01/17 0701 - 01/18 0700 In: 480 [P.O.:480] Out: 180 [Chest Tube:180] Intake/Output this shift: No intake/output data recorded.  General appearance: alert, cooperative and no distress Heart: regular rate and rhythm Lungs: clear to auscultation bilaterally Abdomen: benign Extremities: no edema or calf tenderness Wound: incis healing well  Lab Results: No results for input(s): WBC, HGB, HCT, PLT in the last 72 hours. BMET: No results for input(s): NA, K, CL, CO2, GLUCOSE, BUN, CREATININE, CALCIUM in the last 72 hours.  PT/INR: No results for input(s): LABPROT, INR in the last 72 hours. ABG    Component Value Date/Time   PHART 7.490 (H) 04/26/2018 0535   HCO3 23.9 04/26/2018 0535   ACIDBASEDEF 0.4 04/24/2018 1306   O2SAT 97.7 04/26/2018 0535   CBG (last 3)  No results for input(s): GLUCAP in the last 72 hours.  Meds Scheduled Meds: . bisacodyl  10 mg Oral Daily  . fondaparinux  2.5 mg Subcutaneous Q24H  . rosuvastatin  5 mg Oral QPM  . senna-docusate  1 tablet Oral QHS  . sucralfate  1 g Oral BID   Continuous Infusions: . dextrose 5 % and 0.45% NaCl    . potassium chloride     PRN Meds:.acetaminophen (TYLENOL) oral liquid 160 mg/5 mL, albuterol, azelastine, diazepam, diphenhydrAMINE, HYDROmorphone HCl, ondansetron  (ZOFRAN) IV, potassium chloride, sodium chloride, zolpidem  Xrays Dg Chest Port 1 View  Result Date: 05/05/2018 CLINICAL DATA:  Postoperative lobectomy with subcutaneous emphysema EXAM: PORTABLE CHEST 1 VIEW COMPARISON:  May 04, 2018 FINDINGS: Chest tube remains on the left. There is a minimal left apical pneumothorax. There is extensive subcutaneous air bilaterally, much more on the left than on the right. There is postoperative change on the left with mild volume loss. There is no frank edema or consolidation. Heart size and pulmonary vascularity are normal. There is aortic atherosclerosis. No evident adenopathy. No bone lesions. IMPRESSION: Chest tube remains on the left, unchanged. Minimal left apical pneumothorax. Extensive subcutaneous air, more on the left than on the right. Postoperative change with mild volume loss on the left. Lungs elsewhere clear. Stable cardiac silhouette. There is aortic atherosclerosis. Aortic Atherosclerosis (ICD10-I70.0). Electronically Signed   By: Lowella Grip III M.D.   On: 05/05/2018 07:47   Dg Chest Port 1 View  Result Date: 05/04/2018 CLINICAL DATA:  Follow-up left chest tube EXAM: PORTABLE CHEST 1 VIEW COMPARISON:  05/03/2018 FINDINGS: Cardiac shadows within normal limits. Aortic calcifications are again seen. Postsurgical changes on the left are noted consistent with lobectomy. Left chest tube is noted in place with a considerable amount of subcutaneous emphysema. No definitive pneumothorax is noted at this time. The right lung remains clear. IMPRESSION: Postoperative changes on the left without definitive pneumothorax. No other focal abnormality  is noted. Electronically Signed   By: Inez Catalina M.D.   On: 05/04/2018 07:59    Assessment/Plan: S/P Procedure(s) (LRB): VIDEO ASSISTED THORACOSCOPY (VATS)LEFT LOWER LOBECTOMY, NODE DISSECTION (Left)  1 afeb, VSS some variability in BP 2 sats good on RA, cont IS/Pulm toilet 3 CXR stable appearance 4  chest tube shows no air leak, will place to 10 cm H2O suction from 20 5 no new labs 6 cont to increase activity as able  LOS: 10 days    John Giovanni [facility] 05/05/2018 Pager [phone]   Chart reviewed, patient examined, agree with above. CXR stable with no significant ptx and subcutaneous air looks stable.  There is a small intermittent air leak on 10 cm suction. Will continue as is on 10 cm suction until Monday.

## 2018-05-06 ENCOUNTER — Inpatient Hospital Stay (HOSPITAL_COMMUNITY): Payer: Medicare Other

## 2018-05-06 NOTE — Progress Notes (Addendum)
[address]       [place] [address]             [phone]      11 Days Post-Op Procedure(s) (LRB): VIDEO ASSISTED THORACOSCOPY (VATS)LEFT LOWER LOBECTOMY, NODE DISSECTION (Left) Subjective: More pain today than she has been having  Objective: Vital signs in last 24 hours: Temp:  [97.8 F (36.6 C)-98.7 F (37.1 C)] 97.8 F (36.6 C) (01/18 1900) Pulse Rate:  [83-102] 102 (01/18 1900) Cardiac Rhythm: Normal sinus rhythm (01/19 0715) Resp:  [19-26] 26 (01/18 1900) BP: (132-141)/(67-75) 135/67 (01/18 1900) SpO2:  [93 %-98 %] 98 % (01/18 1900)  Hemodynamic parameters for last 24 hours:    Intake/Output from previous day: 01/18 0701 - 01/19 0700 In: 240 [P.O.:240] Out: 80 [Chest Tube:80] Intake/Output this shift: No intake/output data recorded.  General appearance: alert, cooperative and no distress Heart: regular rate and rhythm Lungs: clear to auscultation bilaterally Abdomen: benign Extremities: no edema or calf tenderness Wound: healing well  Lab Results: No results for input(s): WBC, HGB, HCT, PLT in the last 72 hours. BMET: No results for input(s): NA, K, CL, CO2, GLUCOSE, BUN, CREATININE, CALCIUM in the last 72 hours.  PT/INR: No results for input(s): LABPROT, INR in the last 72 hours. ABG    Component Value Date/Time   PHART 7.490 (H) 04/26/2018 0535   HCO3 23.9 04/26/2018 0535   ACIDBASEDEF 0.4 04/24/2018 1306   O2SAT 97.7 04/26/2018 0535   CBG (last 3)  No results for input(s): GLUCAP in the last 72 hours.  Meds Scheduled Meds: . bisacodyl  10 mg Oral Daily  . fondaparinux  2.5 mg Subcutaneous Q24H  . rosuvastatin  5 mg Oral QPM  . senna-docusate  1 tablet Oral QHS  . sucralfate  1 g Oral BID   Continuous Infusions: . dextrose 5 % and 0.45% NaCl    . potassium chloride     PRN Meds:.acetaminophen (TYLENOL) oral liquid 160 mg/5 mL, albuterol, azelastine, diazepam, diphenhydrAMINE, HYDROmorphone HCl, ondansetron (ZOFRAN)  IV, potassium chloride, sodium chloride, zolpidem  Xrays Dg Chest Port 1 View  Result Date: 05/06/2018 CLINICAL DATA:  Postop lobectomy EXAM: PORTABLE CHEST 1 VIEW COMPARISON:  05/05/2018 FINDINGS: Left chest tube remains in place. Improvement in tiny left apical pneumothorax. Extensive subcutaneous gas in the left chest wall unchanged. Lungs are well aerated and clear. Negative for heart failure. No effusion. IMPRESSION: Interval improvement in tiny left apical pneumothorax. Electronically Signed   By: Franchot Gallo M.D.   On: 05/06/2018 07:39   Dg Chest Port 1 View  Result Date: 05/05/2018 CLINICAL DATA:  Postoperative lobectomy with subcutaneous emphysema EXAM: PORTABLE CHEST 1 VIEW COMPARISON:  May 04, 2018 FINDINGS: Chest tube remains on the left. There is a minimal left apical pneumothorax. There is extensive subcutaneous air bilaterally, much more on the left than on the right. There is postoperative change on the left with mild volume loss. There is no frank edema or consolidation. Heart size and pulmonary vascularity are normal. There is aortic atherosclerosis. No evident adenopathy. No bone lesions. IMPRESSION: Chest tube remains on the left, unchanged. Minimal left apical pneumothorax. Extensive subcutaneous air, more on the left than on the right. Postoperative change with mild volume loss on the left. Lungs elsewhere clear. Stable cardiac silhouette. There is aortic atherosclerosis. Aortic Atherosclerosis (ICD10-I70.0). Electronically Signed   By: Lowella Grip III M.D.   On: 05/05/2018 07:47    Assessment/Plan: S/P Procedure(s) (LRB): VIDEO  ASSISTED THORACOSCOPY (VATS)LEFT LOWER LOBECTOMY, NODE DISSECTION (Left)  1 afeb, VSS  2 sats good on RA 3 no air leak noted, CXR shows interval improvement in small pntx 4 cont current plan for CT to remain at 10 cm H2O suction 5 routine rehab and pulm toilet   LOS: 11 days    John Giovanni PA-C 05/06/2018 Pager [phone]  CXR  looks ok with no ptx and subcutaneous air resolving. There is still an intermittent 1-3 chamber air leak on 10 cm suction. Will continue to suction for now.

## 2018-05-06 NOTE — Plan of Care (Signed)
  Problem: Respiratory: Goal: Respiratory status will improve Outcome: Progressing   Problem: Skin Integrity: Goal: Wound healing without signs and symptoms infection will improve Outcome: Progressing   Problem: Pain Managment: Goal: General experience of comfort will improve Outcome: Progressing

## 2018-05-06 NOTE — Plan of Care (Signed)
  Problem: Education: Goal: Knowledge of disease or condition will improve Outcome: Progressing Goal: Knowledge of the prescribed therapeutic regimen will improve Outcome: Progressing   Problem: Activity: Goal: Risk for activity intolerance will decrease Outcome: Progressing   Problem: Clinical Measurements: Goal: Postoperative complications will be avoided or minimized Outcome: Progressing   Problem: Respiratory: Goal: Respiratory status will improve Outcome: Progressing   Problem: Skin Integrity: Goal: Wound healing without signs and symptoms infection will improve Outcome: Progressing   Problem: Education: Goal: Knowledge of General Education information will improve Description Including pain rating scale, medication(s)/side effects and non-pharmacologic comfort measures Outcome: Progressing   Problem: Health Behavior/Discharge Planning: Goal: Ability to manage health-related needs will improve Outcome: Progressing   Problem: Clinical Measurements: Goal: Ability to maintain clinical measurements within normal limits will improve Outcome: Progressing Goal: Will remain free from infection Outcome: Progressing Goal: Diagnostic test results will improve Outcome: Progressing Goal: Respiratory complications will improve Outcome: Progressing Goal: Cardiovascular complication will be avoided Outcome: Progressing   Problem: Activity: Goal: Risk for activity intolerance will decrease Outcome: Progressing   Problem: Skin Integrity: Goal: Risk for impaired skin integrity will decrease Outcome: Progressing

## 2018-05-07 ENCOUNTER — Inpatient Hospital Stay (HOSPITAL_COMMUNITY): Payer: Medicare Other

## 2018-05-07 NOTE — Progress Notes (Addendum)
      [address]       [place] [address]             [phone]      12 Days Post-Op Procedure(s) (LRB): VIDEO ASSISTED THORACOSCOPY (VATS)LEFT LOWER LOBECTOMY, NODE DISSECTION (Left)   Subjective:  No new complaints.  Having some burning at chest tube site.  Explained this is expected type pain after the type of procedure she had.  Also chest tube will cause irritation in that area until removed.  Objective: Vital signs in last 24 hours: Temp:  [98 F (36.7 C)-98.3 F (36.8 C)] 98.1 F (36.7 C) (01/19 1925) Pulse Rate:  [80-100] 86 (01/20 0300) Cardiac Rhythm: Normal sinus rhythm (01/20 0710) Resp:  [18-22] 20 (01/20 0300) BP: (127-139)/(64-74) 137/71 (01/19 1925) SpO2:  [96 %-100 %] 100 % (01/20 0300)  Intake/Output from previous day: 01/19 0701 - 01/20 0700 In: 240 [P.O.:240] Out: 140 [Chest Tube:140]  General appearance: alert, cooperative and no distress Heart: regular rate and rhythm Lungs: clear to auscultation bilaterally Abdomen: soft, non-tender; bowel sounds normal; no masses,  no organomegaly Extremities: extremities normal, atraumatic, no cyanosis or edema Wound: clean and dry  Lab Results: No results for input(s): WBC, HGB, HCT, PLT in the last 72 hours. BMET: No results for input(s): NA, K, CL, CO2, GLUCOSE, BUN, CREATININE, CALCIUM in the last 72 hours.  PT/INR: No results for input(s): LABPROT, INR in the last 72 hours. ABG    Component Value Date/Time   PHART 7.490 (H) 04/26/2018 0535   HCO3 23.9 04/26/2018 0535   ACIDBASEDEF 0.4 04/24/2018 1306   O2SAT 97.7 04/26/2018 0535   CBG (last 3)  No results for input(s): GLUCAP in the last 72 hours.  Assessment/Plan: S/P Procedure(s) (LRB): VIDEO ASSISTED THORACOSCOPY (VATS)LEFT LOWER LOBECTOMY, NODE DISSECTION (Left)  1. Chest tube- mild intermittent 1+ air leak, on 10 cm suction 2. Pulm-  CXR remains stable, sub q air is improving, stable appearance apical space 3.  CV-hemodynamically stable 4. Arixtra for DVT prophylaxis 5. Dispo- patient stable, very intermittent 1+ air leak, chest tube on 10 cm suction currently, possibly trial water seal soon, repeat CXR in AM   LOS: 12 days    Ellwood Handler 05/07/2018 Patient seen and examined, agree with above It is difficult to tell whether there is still a tiny air leak or just vibration in fluid column with coughing. I suspect there is still an extremely small leak- will keep on 10 cm suction  Remo Lipps C. Roxan Hockey, MD [facility] [phone]

## 2018-05-07 NOTE — Progress Notes (Addendum)
Brief Nutrition Follow-Up Note  Pt remains with good appetite. Noted meal completion 75-100%.   Spoke with Landscape architect, who reports that pt is now requesting that she not receive breakfast (pt husband bringing breakfast daily for greater variety of options secondary to pt's food preferences and intolerances). Manager also aware that RD is ordering meals for pt per her request.   RD continues to order meals for pt per her request. Meal orders are as follows:  05/08/18: Lunch: balsamic grilled chicken, white rice without gravy, asparagus, lemon italian ice, unsweetened ice tea Dinner: steak fries, broccoli, grilled chicken, cherry italian ice, unsweetened tea  05/09/18 Lunch: spaghetti with meat sauce, side salad with thousand island dressing, lemon italian ice, unsweetened tea Dinner: grilled chicken, Ko[place] beans, white rice without gravy, cherry italian ice, unsweetened tea  05/10/18 Lunch: roast [place], broccoli, steak fries, lemon italian ice, unsweetened tea Dinner: Doctor, hospital, whole bean blend, white rice, unsweetened tea, cherry italian ice  05/11/18 Lunch: baked cod, coleslaw, carrots, unsweetened tea, lemon italian ice Dinner: roast beef eye of round, roasted red potatoes, capri vegetables, unsweetened tea, cherry italian ice  05/12/18 Lunch: grilled chicken, [place] beans, steak fries, cherry italian ice, unsweetened tea Dinner: roast [place] breast, breadstick, steamed spinach, unsweetened tea, lemon italian ice  05/13/18 Lunch: cabbage, grilled chicken, steak fries, cherry italian ice, unsweetened tea Dinner: BBQ chicken breast, roast potato, carrots, unsweetened tea, lemon italian ice  05/14/18 Lunch: pot roast, [place] vegetable blend, steak fries, cherry italian ice, unsweetened tea Dinner: rotisserie chicken quarter, baked potato, Riggi beans, unsweetened tea, lemon [place] ice  05/15/18 Lunch: balsamic grilled chicken, fresh asparagus, steak  fries, cherry italian ice, unsweetened tea Dinner: grilled chicken, white rice, carrots, unsweetened tea, lemon italian ice  RD will continue to order meals from hospital stay per pt request.  [name] A. Jimmye Norman, RD, LDN, CDE Pager: [phone] After hours Pager: [phone]

## 2018-05-07 NOTE — Progress Notes (Signed)
Refused vitals at this time. Patient says that she is not supposed to be woken up at this time of night. Alert and oriented x 4. Reinforced that vitals is taken frequently for her care. Continues to refuse.

## 2018-05-08 ENCOUNTER — Inpatient Hospital Stay (HOSPITAL_COMMUNITY): Payer: Medicare Other

## 2018-05-08 NOTE — Progress Notes (Addendum)
      [address]       [place] [address]             [phone]      13 Days Post-Op Procedure(s) (LRB): VIDEO ASSISTED THORACOSCOPY (VATS)LEFT LOWER LOBECTOMY, NODE DISSECTION (Left)   Subjective:  Patient tired this morning,  States they were in her room all night trying to take vitals.  She states they hadn't done this in a week and started up again last night.  Objective: Vital signs in last 24 hours: Temp:  [97.9 F (36.6 C)-98.6 F (37 C)] 98.2 F (36.8 C) (01/21 0731) Pulse Rate:  [84-105] 86 (01/21 0731) Cardiac Rhythm: Normal sinus rhythm (01/21 0731) Resp:  [11-17] 13 (01/21 0731) BP: (141-157)/(71-87) 146/87 (01/21 0731) SpO2:  [97 %-99 %] 98 % (01/21 0731)  Intake/Output from previous day: 01/20 0701 - 01/21 0700 In: 580 [P.O.:580] Out: 50 [Chest Tube:50]  General appearance: alert, cooperative and no distress Heart: regular rate and rhythm Lungs: clear to auscultation bilaterally Abdomen: soft, non-tender; bowel sounds normal; no masses,  no organomegaly Extremities: extremities normal, atraumatic, no cyanosis or edema Wound: clean and dry  Lab Results: No results for input(s): WBC, HGB, HCT, PLT in the last 72 hours. BMET: No results for input(s): NA, K, CL, CO2, GLUCOSE, BUN, CREATININE, CALCIUM in the last 72 hours.  PT/INR: No results for input(s): LABPROT, INR in the last 72 hours. ABG    Component Value Date/Time   PHART 7.490 (H) 04/26/2018 0535   HCO3 23.9 04/26/2018 0535   ACIDBASEDEF 0.4 04/24/2018 1306   O2SAT 97.7 04/26/2018 0535   CBG (last 3)  No results for input(s): GLUCAP in the last 72 hours.  Assessment/Plan: S/P Procedure(s) (LRB): VIDEO ASSISTED THORACOSCOPY (VATS)LEFT LOWER LOBECTOMY, NODE DISSECTION (Left)  1. Chest tube- no definitive air leak, + tidaling with cough- will place to water seal today if okay with Dr. Roxan Hockey 2. Pulm- no acute issues, sub q air continues to improve, CXR is stable  without pneumothorax 3. CV- hemodynamically stable 4. Arixtra for DVT prophylaxis 5. Fatigue- have adjusted vitals order for patient to not be bothered overnight 6. Dispo- patient stable, can hopefully place chest tube to water seal today, will defer to Dr. Roxan Hockey, repeat CXR in AM   LOS: 13 days    Erin Barrett 05/08/2018 Patient seen and examined, agree with above No air leak at all this AM- will place to water seal  [place] C. Roxan Hockey, MD [facility] [phone]

## 2018-05-08 NOTE — Care Management Note (Signed)
Case Management Note  Patient Details  Name: [name] MRN: [id number] Date of Birth: May 26, 1950  Subjective/Objective:   Pt is s/p VATS                 Action/Plan:   PTA independent from home with husband.     Expected Discharge Date:  04/29/18               Expected Discharge Plan:  Home/Self Care(From home with husband, has PCP)  In-House Referral:     Discharge planning Services  CM Consult  Post Acute Care Choice:    Choice offered to:     DME Arranged:    DME Agency:     HH Arranged:    HH Agency:     Status of Service:  In process, will continue to follow  If discussed at Long Length of Stay Meetings, dates discussed:    Additional Comments: 05/08/2018 Pt continues to have chest tube - will be placed to water seal today.  CM will continue to follow Maryclare Labrador, RN 05/08/2018, 9:22 AM

## 2018-05-09 ENCOUNTER — Inpatient Hospital Stay (HOSPITAL_COMMUNITY): Payer: Medicare Other

## 2018-05-09 NOTE — Progress Notes (Signed)
Paged Dr. Roxan Hockey regarding small subcutaneous air in neck, new orders for cxr. Will continue to monitor

## 2018-05-09 NOTE — Progress Notes (Addendum)
      [address]       [place] [address]             [phone]      14 Days Post-Op Procedure(s) (LRB): VIDEO ASSISTED THORACOSCOPY (VATS)LEFT LOWER LOBECTOMY, NODE DISSECTION (Left)   Subjective:  No new complaints.    Objective: Vital signs in last 24 hours: Temp:  [98.2 F (36.8 C)-98.6 F (37 C)] 98.6 F (37 C) (01/22 0743) Pulse Rate:  [100-101] 100 (01/22 0743) Cardiac Rhythm: Sinus tachycardia (01/22 0700) Resp:  [16-20] 16 (01/22 0743) BP: (128-143)/(65-79) 143/79 (01/22 0743) SpO2:  [97 %-98 %] 98 % (01/22 0743)  Intake/Output from previous day: 01/21 0701 - 01/22 0700 In: 800 [P.O.:800] Out: 130 [Chest Tube:130] Intake/Output this shift: Total I/O In: -  Out: 20 [Chest Tube:20]  General appearance: alert, cooperative and no distress Heart: regular rate and rhythm Lungs: clear to auscultation bilaterally Abdomen: soft, non-tender; bowel sounds normal; no masses,  no organomegaly Extremities: extremities normal, atraumatic, no cyanosis or edema Wound: clean and dry  Lab Results: No results for input(s): WBC, HGB, HCT, PLT in the last 72 hours. BMET: No results for input(s): NA, K, CL, CO2, GLUCOSE, BUN, CREATININE, CALCIUM in the last 72 hours.  PT/INR: No results for input(s): LABPROT, INR in the last 72 hours. ABG    Component Value Date/Time   PHART 7.490 (H) 04/26/2018 0535   HCO3 23.9 04/26/2018 0535   ACIDBASEDEF 0.4 04/24/2018 1306   O2SAT 97.7 04/26/2018 0535   CBG (last 3)  No results for input(s): GLUCAP in the last 72 hours.  Assessment/Plan: S/P Procedure(s) (LRB): VIDEO ASSISTED THORACOSCOPY (VATS)LEFT LOWER LOBECTOMY, NODE DISSECTION (Left)  1. Chest tube- + air leak with cough, however this resolves if chest tube site has pressure applied, I think air is leaking around chest tube 2. Pulm- no acute issues, no increase in sub q air, no pneumothorax 3. CV- hemodynamically stable 4. Arixtra for DVT  prophylaxis 5. Dispo- patient stable, I think air leak is due to air leaking around chest tube, CXR is stable, will defer decision to remove chest tube to Dr. Roxan Hockey   LOS: 14 days    Ellwood Handler 05/09/2018  Has severe local irritation and some skin breakdown at the chest tube site After wrapping tube with vasoline gauze there is extreme tidal movement but no air leak with repeated coughing. Will dc chest tube If lung drops will place a pigtail catheter at a a separate site  [place] C. Roxan Hockey, MD [facility] [phone]

## 2018-05-09 NOTE — Progress Notes (Signed)
      [address]       [place] [address]             [phone]      No complaints presently  BP (!) 145/69 (BP Location: Left Arm)   Pulse 88   Temp 98.1 F (36.7 C) (Oral)   Resp 14   Ht 5\' 4"  (1.626 m)   Wt 65.7 kg   SpO2 97%   BMI 24.87 kg/m  CT removed about 12:30 PM CXR reviewed- no change in apical space, but maybe slightly more SQ air No increase in SQ air on exam  Will continue to monitor  Remo Lipps C. Roxan Hockey, MD [facility] [phone]

## 2018-05-09 NOTE — Progress Notes (Signed)
      [address]       [place] [address]             [phone]      After waking up from a nap about an hour ago. Mrs. Bottger noted some discomfort and SQ air in her neck. She feels like it is a little better currently.  On exam has minimal SQ air  Repeat CXR essentially unchanged, maybe slightly less SQ emphysema than previous CXR.  Will continue to observe. Chest tube tray is in room if needed.  [name], MD [facility] [phone]

## 2018-05-09 NOTE — Care Management Important Message (Signed)
Important Message  Patient Details  Name: [name] MRN: [id number] Date of Birth: 1950/05/29   Medicare Important Message Given:  Yes    Tayona Sarnowski P Quanta Robertshaw 05/09/2018, 10:13 AM

## 2018-05-09 NOTE — Plan of Care (Signed)
  Problem: Clinical Measurements: Goal: Postoperative complications will be avoided or minimized Outcome: Progressing   Problem: Respiratory: Goal: Respiratory status will improve Outcome: Progressing   Problem: Skin Integrity: Goal: Wound healing without signs and symptoms infection will improve Outcome: Progressing   Problem: Health Behavior/Discharge Planning: Goal: Ability to manage health-related needs will improve Outcome: Progressing   Problem: Clinical Measurements: Goal: Ability to maintain clinical measurements within normal limits will improve Outcome: Progressing Goal: Will remain free from infection Outcome: Progressing Goal: Diagnostic test results will improve Outcome: Progressing Goal: Respiratory complications will improve Outcome: Progressing Goal: Cardiovascular complication will be avoided Outcome: Progressing   Problem: Coping: Goal: Level of anxiety will decrease Outcome: Progressing   Problem: Pain Managment: Goal: General experience of comfort will improve Outcome: Progressing   Problem: Skin Integrity: Goal: Risk for impaired skin integrity will decrease Outcome: Progressing

## 2018-05-10 ENCOUNTER — Inpatient Hospital Stay (HOSPITAL_COMMUNITY): Payer: Medicare Other

## 2018-05-10 ENCOUNTER — Encounter (HOSPITAL_COMMUNITY)
Admission: RE | Disposition: A | Discharge status: Home / Self Care | Payer: Self-pay | Source: Home / Self Care | Attending: Thoracic Surgery (Cardiothoracic Vascular Surgery)

## 2018-05-10 ENCOUNTER — Encounter (HOSPITAL_COMMUNITY): Payer: Self-pay

## 2018-05-10 ENCOUNTER — Inpatient Hospital Stay (HOSPITAL_COMMUNITY): Payer: Medicare Other | Admitting: Anesthesiology

## 2018-05-10 DIAGNOSIS — J95812 Postprocedural air leak: Secondary | ICD-10-CM

## 2018-05-10 HISTORY — PX: VIDEO BRONCHOSCOPY WITH INSERTION OF INTERBRONCHIAL VALVE (IBV): SHX6178

## 2018-05-10 HISTORY — PX: CHEST TUBE INSERTION: SHX231

## 2018-05-10 SURGERY — BRONCHOSCOPY, FLEXIBLE, WITH INTRABRONCHIAL VALVE INSERTION
Anesthesia: General

## 2018-05-10 MED ORDER — LIDOCAINE 2% (20 MG/ML) 5 ML SYRINGE
INTRAMUSCULAR | Status: DC | PRN
  Administered 2018-05-10: 60 mg via INTRAVENOUS

## 2018-05-10 MED ORDER — EPHEDRINE 5 MG/ML INJ
INTRAVENOUS | Status: AC
  Filled 2018-05-10: qty 10

## 2018-05-10 MED ORDER — PROPOFOL 10 MG/ML IV BOLUS
INTRAVENOUS | Status: AC
  Filled 2018-05-10: qty 20

## 2018-05-10 MED ORDER — PHENYLEPHRINE 40 MCG/ML (10ML) SYRINGE FOR IV PUSH (FOR BLOOD PRESSURE SUPPORT)
PREFILLED_SYRINGE | INTRAVENOUS | Status: DC | PRN
  Administered 2018-05-10 (×3): 80 ug via INTRAVENOUS

## 2018-05-10 MED ORDER — DIPHENHYDRAMINE HCL 50 MG/ML IJ SOLN
INTRAMUSCULAR | Status: AC
  Filled 2018-05-10: qty 1

## 2018-05-10 MED ORDER — ROCURONIUM BROMIDE 50 MG/5ML IV SOSY
PREFILLED_SYRINGE | INTRAVENOUS | Status: DC | PRN
  Administered 2018-05-10: 20 mg via INTRAVENOUS

## 2018-05-10 MED ORDER — SUCCINYLCHOLINE CHLORIDE 200 MG/10ML IV SOSY
PREFILLED_SYRINGE | INTRAVENOUS | Status: DC | PRN
  Administered 2018-05-10: 60 mg via INTRAVENOUS

## 2018-05-10 MED ORDER — LACTATED RINGERS IV SOLN
INTRAVENOUS | Status: DC
  Administered 2018-05-10 (×2): via INTRAVENOUS

## 2018-05-10 MED ORDER — LIDOCAINE HCL (PF) 1 % IJ SOLN
INTRAMUSCULAR | Status: DC | PRN
  Administered 2018-05-10: 5 mL via INTRADERMAL

## 2018-05-10 MED ORDER — FENTANYL CITRATE (PF) 100 MCG/2ML IJ SOLN: 25.0000 ug | INTRAMUSCULAR | Status: DC | PRN

## 2018-05-10 MED ORDER — ONDANSETRON HCL 4 MG/2ML IJ SOLN
INTRAMUSCULAR | Status: DC | PRN
  Administered 2018-05-10: 4 mg via INTRAVENOUS

## 2018-05-10 MED ORDER — ESMOLOL HCL 100 MG/10ML IV SOLN
INTRAVENOUS | Status: AC
  Filled 2018-05-10: qty 10

## 2018-05-10 MED ORDER — SODIUM CHLORIDE 0.9 % IV SOLN
INTRAVENOUS | Status: DC | PRN
  Administered 2018-05-10: 50 ug/min via INTRAVENOUS

## 2018-05-10 MED ORDER — DEXAMETHASONE SODIUM PHOSPHATE 10 MG/ML IJ SOLN
INTRAMUSCULAR | Status: AC
  Filled 2018-05-10: qty 1

## 2018-05-10 MED ORDER — ONDANSETRON HCL 4 MG/2ML IJ SOLN
INTRAMUSCULAR | Status: AC
  Filled 2018-05-10: qty 2

## 2018-05-10 MED ORDER — FENTANYL CITRATE (PF) 250 MCG/5ML IJ SOLN
INTRAMUSCULAR | Status: AC
  Filled 2018-05-10: qty 5

## 2018-05-10 MED ORDER — EPINEPHRINE PF 1 MG/10ML IJ SOSY
PREFILLED_SYRINGE | INTRAMUSCULAR | Status: AC
  Filled 2018-05-10: qty 10

## 2018-05-10 MED ORDER — MIDAZOLAM HCL 2 MG/2ML IJ SOLN
INTRAMUSCULAR | Status: AC
  Filled 2018-05-10: qty 2

## 2018-05-10 MED ORDER — 0.9 % SODIUM CHLORIDE (POUR BTL) OPTIME
TOPICAL | Status: DC | PRN
  Administered 2018-05-10: 1000 mL

## 2018-05-10 MED ORDER — PROPOFOL 10 MG/ML IV BOLUS
INTRAVENOUS | Status: DC | PRN
  Administered 2018-05-10: 20 mg via INTRAVENOUS
  Administered 2018-05-10 (×3): 50 mg via INTRAVENOUS

## 2018-05-10 MED ORDER — ROCURONIUM BROMIDE 50 MG/5ML IV SOSY
PREFILLED_SYRINGE | INTRAVENOUS | Status: AC
  Filled 2018-05-10: qty 20

## 2018-05-10 MED ORDER — ONDANSETRON HCL 4 MG/2ML IJ SOLN: 4.0000 mg | Freq: Once | INTRAMUSCULAR | Status: DC | PRN

## 2018-05-10 MED ORDER — SUCCINYLCHOLINE CHLORIDE 20 MG/ML IJ SOLN
INTRAMUSCULAR | Status: DC | PRN
  Administered 2018-05-10: 60 mg via INTRAVENOUS

## 2018-05-10 MED ORDER — SUCCINYLCHOLINE CHLORIDE 200 MG/10ML IV SOSY
PREFILLED_SYRINGE | INTRAVENOUS | Status: AC
  Filled 2018-05-10: qty 10

## 2018-05-10 MED ORDER — HYDROMORPHONE HCL 1 MG/ML IJ SOLN
INTRAMUSCULAR | Status: AC
  Filled 2018-05-10: qty 1

## 2018-05-10 MED ORDER — PHENYLEPHRINE 40 MCG/ML (10ML) SYRINGE FOR IV PUSH (FOR BLOOD PRESSURE SUPPORT)
PREFILLED_SYRINGE | INTRAVENOUS | Status: AC
  Filled 2018-05-10: qty 10

## 2018-05-10 MED ORDER — EPHEDRINE SULFATE-NACL 50-0.9 MG/10ML-% IV SOSY
PREFILLED_SYRINGE | INTRAVENOUS | Status: DC | PRN
  Administered 2018-05-10: 10 mg via INTRAVENOUS

## 2018-05-10 MED ORDER — DEXAMETHASONE SODIUM PHOSPHATE 10 MG/ML IJ SOLN
INTRAMUSCULAR | Status: DC | PRN
  Administered 2018-05-10: 10 mg via INTRAVENOUS

## 2018-05-10 MED ORDER — MIDAZOLAM HCL 5 MG/5ML IJ SOLN
INTRAMUSCULAR | Status: DC | PRN
  Administered 2018-05-10: 2 mg via INTRAVENOUS

## 2018-05-10 MED ORDER — EPINEPHRINE PF 1 MG/ML IJ SOLN
INTRAMUSCULAR | Status: DC | PRN
  Administered 2018-05-10: 1 mg via ENDOTRACHEOPULMONARY

## 2018-05-10 MED ORDER — FENTANYL CITRATE (PF) 100 MCG/2ML IJ SOLN
INTRAMUSCULAR | Status: DC | PRN
  Administered 2018-05-10: 50 ug via INTRAVENOUS
  Administered 2018-05-10: 100 ug via INTRAVENOUS
  Administered 2018-05-10 (×2): 50 ug via INTRAVENOUS

## 2018-05-10 MED ORDER — DIPHENHYDRAMINE HCL 50 MG/ML IJ SOLN
INTRAMUSCULAR | Status: DC | PRN
  Administered 2018-05-10: 12.5 mg via INTRAVENOUS

## 2018-05-10 MED ORDER — HYDROMORPHONE HCL 1 MG/ML IJ SOLN
0.5000 mg | INTRAMUSCULAR | Status: DC | PRN
  Administered 2018-05-10: 0.5 mg via INTRAVENOUS

## 2018-05-10 SURGICAL SUPPLY — 45 items
ADAPTER BRONCH F/PENTAX (ADAPTER) ×1 IMPLANT
ADAPTER VALVE BIOPSY EBUS (MISCELLANEOUS) IMPLANT
ADPTR VALVE BIOPSY EBUS (MISCELLANEOUS)
CANISTER SUCT 3000ML PPV (MISCELLANEOUS) ×3 IMPLANT
CATH BALLN 4FR (CATHETERS) ×1 IMPLANT
CATH LOADER DEPLOYMENT HUD (CATHETERS) ×1 IMPLANT
CHANNEL WORK EXTEND EDGE 90 (KITS) ×1 IMPLANT
CONT SPEC 4OZ CLIKSEAL STRL BL (MISCELLANEOUS) ×3 IMPLANT
COVER BACK TABLE 60X90IN (DRAPES) ×3 IMPLANT
FILTER STRAW FLUID ASPIR (MISCELLANEOUS) ×1 IMPLANT
FORCEPS BIOP RJ4 1.8 (CUTTING FORCEPS) IMPLANT
FORCEPS BIOP SUPERTRX PREMAR (INSTRUMENTS) ×1 IMPLANT
FORCEPS RADIAL JAW LRG 4 PULM (INSTRUMENTS) IMPLANT
GAUZE SPONGE 4X4 12PLY STRL (GAUZE/BANDAGES/DRESSINGS) ×5 IMPLANT
GAUZE SPONGE 4X4 12PLY STRL LF (GAUZE/BANDAGES/DRESSINGS) ×1 IMPLANT
GLOVE SURG SIGNA 7.5 PF LTX (GLOVE) ×3 IMPLANT
GOWN STRL REUS W/ TWL XL LVL3 (GOWN DISPOSABLE) ×2 IMPLANT
GOWN STRL REUS W/TWL XL LVL3 (GOWN DISPOSABLE) ×1
KIT AIRWAY SIZING HUD (KITS) ×1 IMPLANT
KIT CLEAN ENDO COMPLIANCE (KITS) ×3 IMPLANT
KIT TURNOVER KIT B (KITS) ×3 IMPLANT
MARKER SKIN DUAL TIP RULER LAB (MISCELLANEOUS) ×3 IMPLANT
NS IRRIG 1000ML POUR BTL (IV SOLUTION) ×3 IMPLANT
OIL SILICONE PENTAX (PARTS (SERVICE/REPAIRS)) IMPLANT
PAD ARMBOARD 7.5X6 YLW CONV (MISCELLANEOUS) ×6 IMPLANT
RADIAL JAW LRG 4 PULMONARY (INSTRUMENTS) ×2
STOPCOCK MORSE 400PSI 3WAY (MISCELLANEOUS) ×3 IMPLANT
SUT SILK 0 FSL (SUTURE) ×2 IMPLANT
SYR 10ML KIT SKIN ADHESIVE (MISCELLANEOUS) ×1 IMPLANT
SYR 10ML LL (SYRINGE) ×3 IMPLANT
SYR 20ML ECCENTRIC (SYRINGE) ×4 IMPLANT
SYR 3ML LL SCALE MARK (SYRINGE) ×1 IMPLANT
TAPE CLOTH SURG 4X10 WHT LF (GAUZE/BANDAGES/DRESSINGS) ×1 IMPLANT
TOWEL GREEN STERILE (TOWEL DISPOSABLE) ×3 IMPLANT
TRAP SPECIMEN MUCOUS 40CC (MISCELLANEOUS) ×3 IMPLANT
TRAY WAYNE PNEUMOTHORAX 14X18 (TRAY / TRAY PROCEDURE) ×1 IMPLANT
TUBE CONNECTING 20X1/4 (TUBING) ×3 IMPLANT
UNDERPAD 30X30 (UNDERPADS AND DIAPERS) ×3 IMPLANT
VALVE BIOPSY  SINGLE USE (MISCELLANEOUS) ×3
VALVE BIOPSY SINGLE USE (MISCELLANEOUS) ×2 IMPLANT
VALVE IN CARTRIDGE 6MM HUD (Valve) ×2 IMPLANT
VALVE IN CARTRIDGE 7MM HUD (Valve) ×2 IMPLANT
VALVE IN CARTRIDGE 9MM HUD (Valve) ×2 IMPLANT
VALVE SUCTION BRONCHIO DISP (MISCELLANEOUS) ×5 IMPLANT
WATER STERILE IRR 1000ML POUR (IV SOLUTION) ×3 IMPLANT

## 2018-05-10 NOTE — Progress Notes (Addendum)
      [address]       [place] [address]             [phone]      15 Days Post-Op Procedure(s) (LRB): VIDEO ASSISTED THORACOSCOPY (VATS)LEFT LOWER LOBECTOMY, NODE DISSECTION (Left)   Subjective:  Patient developed worsening sub q air overnight.  She states it has come down some.  She was mildly short of breath overnight, but her saturations are in the upper 90s and she denies shortness of breath currently.  Objective: Vital signs in last 24 hours: Temp:  [98.1 F (36.7 C)-99.3 F (37.4 C)] 98.5 F (36.9 C) (01/23 0729) Pulse Rate:  [88-109] 104 (01/23 0729) Cardiac Rhythm: Normal sinus rhythm (01/23 0700) Resp:  [14-19] 18 (01/23 0729) BP: (141-162)/(69-88) 142/84 (01/23 0729) SpO2:  [96 %-99 %] 97 % (01/23 0729)  Intake/Output from previous day: 01/22 0701 - 01/23 0700 In: 600 [P.O.:600] Out: 70 [Chest Tube:70]  General appearance: alert, cooperative and no distress Heart: regular rate and rhythm Lungs: diminished breath sounds on left Abdomen: soft, non-tender; bowel sounds normal; no masses,  no organomegaly Extremities: sub q air along left chest, into left/right neck  Wound: clean, some erythema at chest tube site  Lab Results: No results for input(s): WBC, HGB, HCT, PLT in the last 72 hours. BMET: No results for input(s): NA, K, CL, CO2, GLUCOSE, BUN, CREATININE, CALCIUM in the last 72 hours.  PT/INR: No results for input(s): LABPROT, INR in the last 72 hours. ABG    Component Value Date/Time   PHART 7.490 (H) 04/26/2018 0535   HCO3 23.9 04/26/2018 0535   ACIDBASEDEF 0.4 04/24/2018 1306   O2SAT 97.7 04/26/2018 0535   CBG (last 3)  No results for input(s): GLUCAP in the last 72 hours.  Assessment/Plan: S/P Procedure(s) (LRB): VIDEO ASSISTED THORACOSCOPY (VATS)LEFT LOWER LOBECTOMY, NODE DISSECTION (Left)  1. CV- hemodynamically stable  2. Pulm- worsening sub q air, has improved some per patient, saturations are okay, no  significant pneumothorax noted, can hopefully observe and avoid chest tube placement, will defer to Dr. Roxan Hockey 3. Arixtra for DVT prophylaxis 4. Dispo- patient with worsening sub q air overnight, per patient has improved some, she is overall asymptomatic, CXR without pneumothorax, hopefully can avoid chest tube will defer to Dr. Roxan Hockey   LOS: 15 days    Ellwood Handler 05/10/2018 Patient seen and examined, agree with above She developed increased SQ emphysema overnight c/w an ongoing, intermittent air leak. Her SQ air has regressed slightly this AM, but I am concerned that she will continue to leak. Given that she lives in [place] I would not be comfortable with her going home anytime soon. I discussed the situation with Mrs and Mr Cowley. I think the best option at this point would be to place endobronchial valves in the LUL bronchi to seal the leak. Would also place a pigtail catheter while in OR. I informed them of the indications, risks, benefits and alternatives. They understand the risks include those associated with general anesthesia. They understand she will need to have another bronchoscopy in about 6 weeks to remove the valves.  She accepts the risks and agrees to proceed. Will plan OR later today.  [name], MD [facility] [phone]

## 2018-05-10 NOTE — Transfer of Care (Signed)
Immediate Anesthesia Transfer of Care Note  Patient: [name]  Procedure(s) Performed: VIDEO BRONCHOSCOPY WITH INSERTION OF INTERBRONCHIAL VALVE (IBV) (N/A ) CHEST TUBE INSERTION (Left )  Patient Location: PACU  Anesthesia Type:General  Level of Consciousness: drowsy and patient cooperative  Airway & Oxygen Therapy: Patient Spontanous Breathing and Patient connected to nasal cannula oxygen  Post-op Assessment: Report given to RN, Post -op Vital signs reviewed and stable and Patient moving all extremities  Post vital signs: Reviewed and stable  Last Vitals:  Vitals Value Taken Time  BP 153/79 05/10/2018  6:30 PM  Temp    Pulse 105 05/10/2018  6:30 PM  Resp 12 05/10/2018  6:30 PM  SpO2 100 % 05/10/2018  6:30 PM  Vitals shown include unvalidated device data.  Last Pain:  Vitals:   05/10/18 1132  TempSrc: Oral  PainSc: 0-No pain      Patients Stated Pain Goal: 0 (53/79/43 2761)  Complications: No apparent anesthesia complications

## 2018-05-10 NOTE — Plan of Care (Signed)
  Problem: Clinical Measurements: Goal: Postoperative complications will be avoided or minimized Outcome: Progressing   Problem: Respiratory: Goal: Respiratory status will improve Outcome: Progressing   Problem: Skin Integrity: Goal: Wound healing without signs and symptoms infection will improve Outcome: Progressing   Problem: Health Behavior/Discharge Planning: Goal: Ability to manage health-related needs will improve Outcome: Progressing   Problem: Clinical Measurements: Goal: Ability to maintain clinical measurements within normal limits will improve Outcome: Progressing Goal: Will remain free from infection Outcome: Progressing Goal: Diagnostic test results will improve Outcome: Progressing Goal: Respiratory complications will improve Outcome: Progressing Goal: Cardiovascular complication will be avoided Outcome: Progressing   Problem: Coping: Goal: Level of anxiety will decrease Outcome: Progressing   Problem: Pain Managment: Goal: General experience of comfort will improve Outcome: Progressing   Problem: Skin Integrity: Goal: Risk for impaired skin integrity will decrease Outcome: Progressing

## 2018-05-10 NOTE — Anesthesia Preprocedure Evaluation (Addendum)
Anesthesia Evaluation  Patient identified by MRN, date of birth, ID band Patient awake    Reviewed: Allergy & Precautions, NPO status , Patient's Chart, lab work & pertinent test results  Airway Mallampati: IV  TM Distance: >3 FB Neck ROM: Full    Dental no notable dental hx.    Pulmonary  Lung mass Adenocarcinoma, lung  Acute bronchitis S/P lobectomy of lung, left lower lobectomy     breath sounds clear to auscultation + decreased breath sounds (Left)      Cardiovascular hypertension, Normal cardiovascular exam Rhythm:Regular Rate:Normal  ECG: NSR, rate 86   Neuro/Psych  Headaches, negative psych ROS   GI/Hepatic Neg liver ROS, GERD  ,IBS (irritable bowel syndrome)   Endo/Other  negative endocrine ROS  Renal/GU negative Renal ROS     Musculoskeletal negative musculoskeletal ROS (+)   Abdominal   Peds  Hematology  (+) anemia , HLD   Anesthesia Other Findings AIR LEAK POST LEFT LOBECTOMY  Reproductive/Obstetrics                            Anesthesia Physical Anesthesia Plan  ASA: III  Anesthesia Plan: General   Post-op Pain Management:    Induction: Intravenous  PONV Risk Score and Plan: 3 and Ondansetron, Dexamethasone, Treatment may vary due to age or medical condition and Midazolam  Airway Management Planned: Oral ETT and Video Laryngoscope Planned  Additional Equipment:   Intra-op Plan:   Post-operative Plan: Extubation in OR  Informed Consent: I have reviewed the patients History and Physical, chart, labs and discussed the procedure including the risks, benefits and alternatives for the proposed anesthesia with the patient or authorized representative who has indicated his/her understanding and acceptance.     Dental advisory given  Plan Discussed with: CRNA  Anesthesia Plan Comments:        Anesthesia Quick Evaluation

## 2018-05-10 NOTE — Anesthesia Postprocedure Evaluation (Signed)
Anesthesia Post Note  Patient: [name]  Procedure(s) Performed: VIDEO BRONCHOSCOPY WITH INSERTION OF INTERBRONCHIAL VALVE (IBV) (N/A ) CHEST TUBE INSERTION (Left )     Patient location during evaluation: PACU Anesthesia Type: General Level of consciousness: awake and alert Pain management: pain level controlled Vital Signs Assessment: post-procedure vital signs reviewed and stable Respiratory status: spontaneous breathing, nonlabored ventilation, respiratory function stable and patient connected to nasal cannula oxygen Cardiovascular status: blood pressure returned to baseline, stable and tachycardic Postop Assessment: no apparent nausea or vomiting Anesthetic complications: no    Last Vitals:  Vitals:   05/10/18 1930 05/10/18 1958  BP: (!) 162/74 (!) 162/80  Pulse: (!) 103 (!) 108  Resp: (!) 21 16  Temp: 37 C 36.7 C  SpO2: 98% 95%    Last Pain:  Vitals:   05/10/18 1958  TempSrc: Oral  PainSc:                  [name]

## 2018-05-10 NOTE — Brief Op Note (Signed)
04/25/2018 - 05/10/2018  6:19 PM  PATIENT:  [name]  68 y.o. female  PRE-OPERATIVE DIAGNOSIS:  AIR LEAK POST LOBECTOMY  POST-OPERATIVE DIAGNOSIS:  AIR LEAK POST LOBECTOMY  PROCEDURE:  Procedure(s): VIDEO BRONCHOSCOPY WITH INSERTION OF INTERBRONCHIAL VALVE (IBV) (N/A) CHEST TUBE INSERTION (Left)  SURGEON:  Surgeon(s) and Role:    * [name], MD - Primary  PHYSICIAN ASSISTANT:   ASSISTANTS: none   ANESTHESIA:   general  EBL:  minimal  BLOOD ADMINISTERED:none  DRAINS: 28F Chest Tube(s) in the left   LOCAL MEDICATIONS USED:  LIDOCAINE  and Amount: 5 ml  SPECIMEN:  No Specimen  DISPOSITION OF SPECIMEN:  N/A  COUNTS:  NO endoscopic  TOURNIQUET:  * No tourniquets in log *  DICTATION: .Other Dictation: Dictation Number -  PLAN OF CARE: Admit to inpatient   PATIENT DISPOSITION:  PACU - hemodynamically stable.   Delay start of Pharmacological VTE agent (>24hrs) due to surgical blood loss or risk of bleeding: no

## 2018-05-10 NOTE — Progress Notes (Signed)
Pt wants to wait till goes to short stay to get IV.

## 2018-05-10 NOTE — OR Nursing (Addendum)
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 Per hospital policy blood was drawn for exposure panel.

## 2018-05-10 NOTE — Anesthesia Procedure Notes (Signed)
Procedure Name: Intubation Date/Time: 05/10/2018 4:00 PM Performed by: Lavell Luster, CRNA Pre-anesthesia Checklist: Patient identified, Emergency Drugs available, Suction available, Patient being monitored and Timeout performed Patient Re-evaluated:Patient Re-evaluated prior to induction Oxygen Delivery Method: Circle system utilized Preoxygenation: Pre-oxygenation with 100% oxygen Induction Type: IV induction Ventilation: Mask ventilation without difficulty Laryngoscope Size: Mac, 3 and Glidescope Grade View: Grade I Tube type: Oral Tube size: 8.0 mm Number of attempts: 1 Airway Equipment and Method: Stylet and Video-laryngoscopy Placement Confirmation: ETT inserted through vocal cords under direct vision,  positive ETCO2 and breath sounds checked- equal and bilateral Secured at: 21 cm Tube secured with: Tape Dental Injury: Teeth and Oropharynx as per pre-operative assessment  Difficulty Due To: Difficulty was anticipated, Difficult Airway- due to limited oral opening, Difficult Airway- due to anterior larynx and Difficult Airway-  due to edematous airway Future Recommendations: Recommend- induction with short-acting agent, and alternative techniques readily available Comments: DL x 1 with videolaryngoscope, Grade I View noted, ETT passed easily.  Ellender verified placement.  Henderson Cloud, CRNA

## 2018-05-10 NOTE — Interval H&P Note (Signed)
History and Physical Interval Note:  05/10/2018 2:59 PM  [name]  has presented today for surgery, with the diagnosis of AIR LEAK POST LOBECTOMY  The various methods of treatment have been discussed with the patient and family. After consideration of risks, benefits and other options for treatment, the patient has consented to  Procedure(s): VIDEO BRONCHOSCOPY WITH INSERTION OF INTERBRONCHIAL VALVE (IBV) (N/A) CHEST TUBE INSERTION (Left) as a surgical intervention .  The patient's history has been reviewed, patient examined, no change in status, stable for surgery.  I have reviewed the patient's chart and labs.  Questions were answered to the patient's satisfaction.     [name]

## 2018-05-10 NOTE — H&P (View-Only) (Signed)
      [address]       [place] [address]             [phone]      15 Days Post-Op Procedure(s) (LRB): VIDEO ASSISTED THORACOSCOPY (VATS)LEFT LOWER LOBECTOMY, NODE DISSECTION (Left)   Subjective:  Patient developed worsening sub q air overnight.  She states it has come down some.  She was mildly short of breath overnight, but her saturations are in the upper 90s and she denies shortness of breath currently.  Objective: Vital signs in last 24 hours: Temp:  [98.1 F (36.7 C)-99.3 F (37.4 C)] 98.5 F (36.9 C) (01/23 0729) Pulse Rate:  [88-109] 104 (01/23 0729) Cardiac Rhythm: Normal sinus rhythm (01/23 0700) Resp:  [14-19] 18 (01/23 0729) BP: (141-162)/(69-88) 142/84 (01/23 0729) SpO2:  [96 %-99 %] 97 % (01/23 0729)  Intake/Output from previous day: 01/22 0701 - 01/23 0700 In: 600 [P.O.:600] Out: 70 [Chest Tube:70]  General appearance: alert, cooperative and no distress Heart: regular rate and rhythm Lungs: diminished breath sounds on left Abdomen: soft, non-tender; bowel sounds normal; no masses,  no organomegaly Extremities: sub q air along left chest, into left/right neck  Wound: clean, some erythema at chest tube site  Lab Results: No results for input(s): WBC, HGB, HCT, PLT in the last 72 hours. BMET: No results for input(s): NA, K, CL, CO2, GLUCOSE, BUN, CREATININE, CALCIUM in the last 72 hours.  PT/INR: No results for input(s): LABPROT, INR in the last 72 hours. ABG    Component Value Date/Time   PHART 7.490 (H) 04/26/2018 0535   HCO3 23.9 04/26/2018 0535   ACIDBASEDEF 0.4 04/24/2018 1306   O2SAT 97.7 04/26/2018 0535   CBG (last 3)  No results for input(s): GLUCAP in the last 72 hours.  Assessment/Plan: S/P Procedure(s) (LRB): VIDEO ASSISTED THORACOSCOPY (VATS)LEFT LOWER LOBECTOMY, NODE DISSECTION (Left)  1. CV- hemodynamically stable  2. Pulm- worsening sub q air, has improved some per patient, saturations are okay, no  significant pneumothorax noted, can hopefully observe and avoid chest tube placement, will defer to Dr. Roxan Hockey 3. Arixtra for DVT prophylaxis 4. Dispo- patient with worsening sub q air overnight, per patient has improved some, she is overall asymptomatic, CXR without pneumothorax, hopefully can avoid chest tube will defer to Dr. Roxan Hockey   LOS: 15 days    Ellwood Handler 05/10/2018 Patient seen and examined, agree with above She developed increased SQ emphysema overnight c/w an ongoing, intermittent air leak. Her SQ air has regressed slightly this AM, but I am concerned that she will continue to leak. Given that she lives in [place] [place] I would not be comfortable with her going home anytime soon. I discussed the situation with Mrs and Mr Mulkern. I think the best option at this point would be to place endobronchial valves in the LUL bronchi to seal the leak. Would also place a pigtail catheter while in OR. I informed them of the indications, risks, benefits and alternatives. They understand the risks include those associated with general anesthesia. They understand she will need to have another bronchoscopy in about 6 weeks to remove the valves.  She accepts the risks and agrees to proceed. Will plan OR later today.  [name], MD [facility] [phone]

## 2018-05-11 ENCOUNTER — Telehealth: Payer: Self-pay | Admitting: *Deleted

## 2018-05-11 ENCOUNTER — Encounter (HOSPITAL_COMMUNITY): Payer: Self-pay | Admitting: Thoracic Surgery (Cardiothoracic Vascular Surgery)

## 2018-05-11 ENCOUNTER — Inpatient Hospital Stay (HOSPITAL_COMMUNITY): Payer: Medicare Other

## 2018-05-11 NOTE — Telephone Encounter (Signed)
Oncology Nurse Navigator Documentation  Oncology Nurse Navigator Flowsheets 05/11/2018  Navigator Location [facility]  Navigator Encounter Type Telephone/I called Ms. Fick to schedule her to be seen at [facility] on 05/24/2018.  I was unable to reach but did leave vm message to call me with my name and phone number.   Telephone Outgoing Call  Barriers/Navigation Needs Coordination of Care  Interventions Coordination of Care  Acuity Level 1  Time Spent with Patient 15

## 2018-05-11 NOTE — Care Management Note (Signed)
Case Management Note  Patient Details  Name: [name] MRN: [id number] Date of Birth: 08-10-1950  Subjective/Objective:   Pt is s/p VATS                 Action/Plan:   PTA independent from home with husband.     Expected Discharge Date:  04/29/18               Expected Discharge Plan:  Home/Self Care(From home with husband, has PCP)  In-House Referral:     Discharge planning Services  CM Consult  Post Acute Care Choice:    Choice offered to:     DME Arranged:    DME Agency:     HH Arranged:    HH Agency:     Status of Service:  In process, will continue to follow  If discussed at Long Length of Stay Meetings, dates discussed:    Additional Comments: 05/11/2018  Pt now has pigtail and still displays crepitus.  Pt independently ambulates around unit.  Pt may benefit from [facility] at discharge -CM will continue to follow for discharge needs  05/08/18 Pt continues to have chest tube - will be placed to water seal today.  CM will continue to follow Maryclare Labrador, RN 05/11/2018, 4:43 PM

## 2018-05-11 NOTE — Progress Notes (Addendum)
      [address]       [place] [address]             [phone]      1 Day Post-Op Procedure(s) (LRB): VIDEO BRONCHOSCOPY WITH INSERTION OF INTERBRONCHIAL VALVE (IBV) (N/A) CHEST TUBE INSERTION (Left)   Subjective:  No new complaints.  Doing well, feels like her swelling has decreased  Objective: Vital signs in last 24 hours: Temp:  [97.2 F (36.2 C)-99.4 F (37.4 C)] 99.4 F (37.4 C) (01/24 0747) Pulse Rate:  [96-117] 106 (01/24 0747) Cardiac Rhythm: Sinus tachycardia (01/24 0700) Resp:  [12-24] 24 (01/24 0747) BP: (136-162)/(60-91) 137/60 (01/24 0747) SpO2:  [94 %-100 %] 94 % (01/24 0747)  Intake/Output from previous day: 01/23 0701 - 01/24 0700 In: 1240 [P.O.:240; I.V.:1000] Out: 29 [Chest Tube:70] Intake/Output this shift: Total I/O In: 0  Out: 110 [Chest Tube:110]  General appearance: alert, cooperative and no distress Heart: regular rate and rhythm Lungs: clear to auscultation bilaterally Abdomen: soft, non-tender; bowel sounds normal; no masses,  no organomegaly Extremities: extremities normal, atraumatic, no cyanosis or edema Wound: clean and dry  Lab Results: No results for input(s): WBC, HGB, HCT, PLT in the last 72 hours. BMET: No results for input(s): NA, K, CL, CO2, GLUCOSE, BUN, CREATININE, CALCIUM in the last 72 hours.  PT/INR: No results for input(s): LABPROT, INR in the last 72 hours. ABG    Component Value Date/Time   PHART 7.490 (H) 04/26/2018 0535   HCO3 23.9 04/26/2018 0535   ACIDBASEDEF 0.4 04/24/2018 1306   O2SAT 97.7 04/26/2018 0535   CBG (last 3)  No results for input(s): GLUCAP in the last 72 hours.  Assessment/Plan: S/P Procedure(s) (LRB): VIDEO BRONCHOSCOPY WITH INSERTION OF INTERBRONCHIAL VALVE (IBV) (N/A) CHEST TUBE INSERTION (Left)  1. Chest tube- no air leak present, some tidaling with cough on water seal 2. Pulm- no acute issues, off oxygen, CXR shows stable appearance of sub q air, volume loss of  left lung, apical space 3. CV- hemodynamically stable 4. Arixtra for DVT 5. Dispo- patient stable, + tidaling with cough, no definitive air leak present on water seal, likely leave chest tube in place today, continue current care   LOS: 16 days    Ellwood Handler 05/11/2018 Patient seen and examined, agree with above No air leak- will leave tube in place on water seal again today Recheck in AM  [place] C. Roxan Hockey, MD [facility] [phone]

## 2018-05-11 NOTE — Telephone Encounter (Signed)
[name] called and left me a vm message, I called her back.  I scheduled her to be seen Feb 6th at thoracic clinic to see Dr. Roxan Hockey and Dr. [name].  Patient is requesting information on research for her type of lung cancer.  I was unable to provider her with any information but did update her that Dr. [name] can update her on this when she is seen.  I also gave her the [place] website to help with questions on her cancer.

## 2018-05-11 NOTE — Op Note (Signed)
[name], [name] MEDICAL RECORD PI:[id number] ACCOUNT [id number] DATE OF BIRTH:Mar 16, 1951 FACILITY: [facility] LOCATION: [facility] PHYSICIAN:Norma Montemurro Chaya Jan, MD  OPERATIVE REPORT  DATE OF PROCEDURE:  05/10/2018  PREOPERATIVE DIAGNOSIS:  Prolonged air leak after lobectomy.   POSTOPERATIVE DIAGNOSIS:  Prolonged air leak after lobectomy.  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  SURGEON:  Modesto Charon, MD  ASSISTANT:  None.  ANESTHESIA:  General.  FINDINGS:  14-French pigtail catheter placed into the left pleural space.  X-ray revealed good position of the catheter.  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  CLINICAL NOTE:  Mrs. Mode is a 68 year old woman who is a nonsmoker.  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  She was advised to undergo surgical resection and had a lobectomy done  approximately 2 weeks ago.  She had a persistent small intermittent air leak.  She was advised to undergo bronchoscopy for intrabronchial valve replacement as well as placement of a new left pleural chest tube.  The indications, risks, benefits, and  alternatives were discussed in detail with the patient.  She understood and accepted the risks and agreed to proceed.  OPERATIVE NOTE:  The patient was brought to the operating room on 05/10/2018.  She had induction of general anesthesia and was intubated.  The left chest was prepped and draped in the usual sterile fashion.  A timeout was performed.  Lidocaine 1% was  used for local anesthesia at the site.  A total of 5 mL was used.  A [name] pneumothorax kit was utilized.  The 14-French pigtail catheter was placed using modified Seldinger technique.  The left pleural space was entered with a needle, a wire was  advanced.  The  tract over the wire then was dilated and the pigtail catheter was placed.  The inner cannula was removed.  The pigtail catheter was placed to Pleur-evac suction.  There was return of serous fluid and respiratory variation was noted.  The  catheter was secured with a 0 silk suture.  A chest x-ray was performed which showed the catheter to be relatively deep within the chest.  The suture was cut and the catheter was pulled back 2 cm and resutured again with a 0 silk suture.  A dressing was  applied and the catheter was left to suction throughout the remainder of the procedure.  Flexible fiberoptic bronchoscopy was performed via the endotracheal tube.  On the right, there was normal endobronchial anatomy with no endobronchial lesions seen.  On the left side, there was some edema at the left lower lobe bronchial closure.  There  appeared to be a small defect approximately 1-2 mm in diameter.  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  Attention then was turned to the left upper lobe bronchus.  The anterior- apical- posterior segmental bronchus sized for a 9 mm valve and the lingular bronchus sized for a 6 mm valve.  The valves were  placed.  The first valve placed in the lingular bronchus had to be removed after being placed too deep.  The second valve was in good position.  A sheath for navigational bronchoscopy was then placed  through the access port of the bronchoscope and the  tip was directed at the possible mucosal defect.  BioGlue then was applied to this area; however, during application of BioGlue, excess glue got into the main stem bronchus and left upper lobe bronchus area.  The catheter was removed and the excess  BioGlue was removed using biopsy forceps.  During this process, the intrabronchial valves were  displaced and had to be removed.  New valves were placed at the same sites.  There did appear to be adequate closure of the possible fistula.  The bronchoscope  was removed.  The patient was extubated in the operating room and taken to the [facility] [place] [facility] in good condition.  TN/NUANCE  D:05/11/2018 T:05/11/2018 JOB:[id number]

## 2018-05-12 ENCOUNTER — Inpatient Hospital Stay (HOSPITAL_COMMUNITY): Payer: Medicare Other

## 2018-05-12 NOTE — Progress Notes (Signed)
Patient ID: [name], female   DOB: [birth year], 68 y.o.   MRN: [id number]      [address]       [place] [address]             [phone]                 2 Days Post-Op Procedure(s) (LRB): VIDEO BRONCHOSCOPY WITH INSERTION OF INTERBRONCHIAL VALVE (IBV) (N/A) CHEST TUBE INSERTION (Left)  LOS: 17 days   Subjective:  Patient didn't sleep all that well last night.  She is getting anxious again that she is not progressing.  + ambulation  Objective: Vital signs in last 24 hours: Patient Vitals for the past 24 hrs:  BP Temp Temp src Pulse Resp SpO2  05/12/18 0637 (!) 137/56 98.1 F (36.7 C) Oral 90 16 95 %  05/11/18 2019 115/67 98.7 F (37.1 C) Oral (!) 116 (!) 25 95 %  05/11/18 1538 126/76 98.7 F (37.1 C) Oral (!) 105 (!) 22 95 %  05/11/18 1121 113/63 98.5 F (36.9 C) Oral 97 19 97 %    Filed Weights   04/25/18 0624  Weight: 65.7 kg    Intake/Output from previous day: 01/24 0701 - 01/25 0700 In: 960 [P.O.:960] Out: 361 [Urine:1; Chest Tube:360] Intake/Output this shift: Total I/O In: 240 [P.O.:240] Out: 0   Scheduled Meds: . bisacodyl  10 mg Oral Daily  . fondaparinux  2.5 mg Subcutaneous Q24H  . rosuvastatin  5 mg Oral QPM  . senna-docusate  1 tablet Oral QHS  . sucralfate  1 g Oral BID   Continuous Infusions: . dextrose 5 % and 0.45% NaCl 10 mL/hr at 05/10/18 2000  . potassium chloride     PRN Meds:.acetaminophen (TYLENOL) oral liquid 160 mg/5 mL, albuterol, azelastine, diazepam, diphenhydrAMINE, HYDROmorphone HCl, ondansetron (ZOFRAN) IV, potassium chloride, sodium chloride, zolpidem  General appearance: alert, cooperative and no distress Heart: regular rate and rhythm Lungs: diminished breath sounds on left Abdomen: soft, non-tender; bowel sounds normal; no masses,  no organomegaly Extremities: extremities normal, atraumatic, no cyanosis or edema Wound: clean and dry  Lab Results: CBC:No results for input(s): WBC, HGB, HCT, PLT  in the last 72 hours. BMET: No results for input(s): NA, K, CL, CO2, GLUCOSE, BUN, CREATININE, CALCIUM in the last 72 hours.  PT/INR: No results for input(s): LABPROT, INR in the last 72 hours.   Radiology Dg Chest Port 1 View  Result Date: 05/12/2018 CLINICAL DATA:  S/P lobectomy of lung, check chest tube position EXAM: PORTABLE CHEST 1 VIEW COMPARISON:  Chest x-ray dated 05/11/2018. FINDINGS: LEFT-sided chest tube is stable in position. No LEFT-sided pneumothorax appreciated on today's study, although possibly obscured by the large amount of overlying subcutaneous emphysema. RIGHT lung remains clear. Heart size and mediastinal contours are grossly stable. IMPRESSION: LEFT-sided chest tube is stable in position. No visible LEFT-sided pneumothorax, although possibly obscured by the overlying subcutaneous emphysema. Electronically Signed   By: Franki Cabot M.D.   On: 05/12/2018 08:46   Dg Chest Port 1 View  Result Date: 05/11/2018 CLINICAL DATA:  Status post extubation EXAM: PORTABLE CHEST 1 VIEW COMPARISON:  May 10, 2018 FINDINGS: Endotracheal tube is been removed. There is currently a chest tube in the periphery of the left upper hemithorax. There is a focal apical pneumothorax without tension component. There is extensive subcutaneous air on the left. Right lung is hyperexpanded but clear. There is moderate volume loss on the left with shift of  heart and mediastinum to the left. There is aortic atherosclerosis. IMPRESSION: Chest tube present on the left. Current left apical pneumothorax without tension component. Persistent extensive subcutaneous emphysema. Persistent volume loss on the left with shift of heart and mediastinum toward the left. Right lung hyperexpanded but clear. There is aortic atherosclerosis. Aortic Atherosclerosis (ICD10-I70.0). Electronically Signed   By: Lowella Grip III M.D.   On: 05/11/2018 08:00   Dg Chest Port 1 View  Result Date: 05/10/2018 CLINICAL DATA:  Post  median bronchoscopy and insertion of intrabronchial valve EXAM: PORTABLE CHEST 1 VIEW COMPARISON:  Portable exam 1648 hours compared to 05/10/2018 at 0708 hours FINDINGS: Tip of endotracheal tube projects 4.5 cm above carina. Pigtail LEFT thoracostomy tube present. Interbronchial valves noted at LEFT hilum. Normal heart size and mediastinal contours. Volume loss in LEFT hemithorax with hyperinflation of the RIGHT lung. No definite pneumothorax. Extensive chest wall emphysema LEFT chest extending into the cervical regions bilaterally. IMPRESSION: Volume loss in the LEFT hemithorax. Extensive chest wall emphysema into cervical region bilaterally. Electronically Signed   By: Lavonia Dana M.D.   On: 05/10/2018 17:10     Assessment/Plan: S/P Procedure(s) (LRB): VIDEO BRONCHOSCOPY WITH INSERTION OF INTERBRONCHIAL VALVE (IBV) (N/A) CHEST TUBE INSERTION (Left)  1. Chest tube- intermittent 1+ air leak today with cough, CXR shows collapse of lung- will place chest tube back on suction, sub q air remains stable, improved on physical exam 2. CV-hemodynamically stable 3. Pulm- no acute issues, patient is asymptomatic from pneumothorax, good saturations 4. Arixtra for DVT 5. Dispo- patient with new intermittent 1+ air leak, CXR with pneumothorax, will transition chest tube back to suction, repeat CXR in AM      Grace Isaac MD 05/12/2018 9:26 AM

## 2018-05-13 ENCOUNTER — Inpatient Hospital Stay (HOSPITAL_COMMUNITY): Payer: Medicare Other

## 2018-05-13 MED ORDER — TRAMADOL 5 MG/ML ORAL SUSPENSION: 50.0000 mg | Freq: Four times a day (QID) | ORAL | Status: DC | PRN

## 2018-05-13 MED ORDER — BISACODYL 5 MG PO TBEC: 10.0000 mg | DELAYED_RELEASE_TABLET | Freq: Every day | ORAL | Status: DC | PRN

## 2018-05-13 MED ORDER — TRAMADOL HCL 50 MG PO TABS: 50.0000 mg | ORAL_TABLET | Freq: Four times a day (QID) | ORAL | Status: DC | PRN

## 2018-05-13 MED ORDER — SENNOSIDES-DOCUSATE SODIUM 8.6-50 MG PO TABS: 1.0000 | ORAL_TABLET | Freq: Every evening | ORAL | Status: DC | PRN

## 2018-05-13 MED ORDER — ALBUTEROL SULFATE (2.5 MG/3ML) 0.083% IN NEBU
2.5000 mg | INHALATION_SOLUTION | Freq: Four times a day (QID) | RESPIRATORY_TRACT | Status: DC | PRN
  Administered 2018-05-14: 2.5 mg via RESPIRATORY_TRACT
  Filled 2018-05-13: qty 3

## 2018-05-13 NOTE — Progress Notes (Addendum)
      [address]       [place] [address]             [phone]      3 Days Post-Op Procedure(s) (LRB): VIDEO BRONCHOSCOPY WITH INSERTION OF INTERBRONCHIAL VALVE (IBV) (N/A) CHEST TUBE INSERTION (Left)   Subjective:  Patient is starting to get discouraged.  She feels very weak and tires out just going to the bathroom.  She had a lot more shoulder discomfort yesterday, but ice and heat are helping.  Objective: Vital signs in last 24 hours: Temp:  [98 F (36.7 C)-98.5 F (36.9 C)] 98.3 F (36.8 C) (01/26 0745) Pulse Rate:  [92-104] 93 (01/26 0745) Cardiac Rhythm: Sinus tachycardia (01/25 1946) Resp:  [16-27] 16 (01/26 0745) BP: (146-157)/(69-85) 156/85 (01/26 0745) SpO2:  [92 %-100 %] 98 % (01/26 0745)  Intake/Output from previous day: 01/25 0701 - 01/26 0700 In: 480 [P.O.:480] Out: 370 [Chest Tube:370]  General appearance: alert, cooperative and no distress Heart: regular rate and rhythm Lungs: loud auditory breath sounds on left Abdomen: soft, non-tender; bowel sounds normal; no masses,  no organomegaly Extremities: extremities normal, atraumatic, no cyanosis or edema Wound: clean and dry  Lab Results: No results for input(s): WBC, HGB, HCT, PLT in the last 72 hours. BMET: No results for input(s): NA, K, CL, CO2, GLUCOSE, BUN, CREATININE, CALCIUM in the last 72 hours.  PT/INR: No results for input(s): LABPROT, INR in the last 72 hours. ABG    Component Value Date/Time   PHART 7.490 (H) 04/26/2018 0535   HCO3 23.9 04/26/2018 0535   ACIDBASEDEF 0.4 04/24/2018 1306   O2SAT 97.7 04/26/2018 0535   CBG (last 3)  No results for input(s): GLUCAP in the last 72 hours.  Assessment/Plan: S/P Procedure(s) (LRB): VIDEO BRONCHOSCOPY WITH INSERTION OF INTERBRONCHIAL VALVE (IBV) (N/A) CHEST TUBE INSERTION (Left)  1. Chest tube- placed back on suction yesterday, no air leak, some tidaling present, leave on suction today 2. Pulm- improvement in  pneumothorax, continued atelectasis, continue IS, will add prn nebs 3. Shoulder pain- continue heat and ice, likely increased due to re-expansion of lung yesterday, will add Tramadol prn for pain relief as she doesn't want Diluadid 4. Arixtra for DVT 5. Dispo- patient stable, will get PT consult per her request, leave chest tube on suction, repeat CXr in AM   LOS: 18 days    [name] 05/13/2018  ptx on left improved , no air leak today will try back on suction Follow up chest xray  I have seen and examined [name] and agree with the above assessment  and plan.  Cheryl Isaac MD [facility] [phone] Office [phone] 05/13/2018 10:33 AM

## 2018-05-13 NOTE — Progress Notes (Deleted)
Patient has rhonchi throughout, she is having a large amount of [place] foamy bile being suctions when allowed.  Niece is at bedside and is refusing all medications, PO, IV and SQ.  O2 has been turned up to 5L [place].  O2 sats are in the low 90's.  MD is aware. Will continue to monitor.  Tilda Burrow [place], RN 05/13/2018 10:23 AM

## 2018-05-14 ENCOUNTER — Encounter: Payer: Medicare Other | Admitting: Thoracic Surgery (Cardiothoracic Vascular Surgery)

## 2018-05-14 ENCOUNTER — Inpatient Hospital Stay (HOSPITAL_COMMUNITY): Payer: Medicare Other

## 2018-05-14 NOTE — Progress Notes (Signed)
4 Days Post-Op Procedure(s) (LRB): VIDEO BRONCHOSCOPY WITH INSERTION OF INTERBRONCHIAL VALVE (IBV) (N/A) CHEST TUBE INSERTION (Left) Subjective: Feels better this AM, minimal nose bleed overnight  Objective: Vital signs in last 24 hours: Temp:  [97.8 F (36.6 C)-98.6 F (37 C)] 98.5 F (36.9 C) (01/27 0722) Pulse Rate:  [98-118] 98 (01/27 0722) Cardiac Rhythm: Normal sinus rhythm (01/27 0700) Resp:  [14-23] 18 (01/27 0722) BP: (128-149)/(72-87) 128/72 (01/27 0722) SpO2:  [80 %-97 %] 97 % (01/27 0722)  Hemodynamic parameters for last 24 hours:    Intake/Output from previous day: 01/26 0701 - 01/27 0700 In: 480 [P.O.:480] Out: 555 [Chest Tube:555] Intake/Output this shift: No intake/output data recorded.  General appearance: alert, cooperative and no distress Neurologic: intact Heart: tachy, regular Lungs: diminished breath sounds on left and Ct sounds on left extreme tidal variation, no air leak  Lab Results: No results for input(s): WBC, HGB, HCT, PLT in the last 72 hours. BMET: No results for input(s): NA, K, CL, CO2, GLUCOSE, BUN, CREATININE, CALCIUM in the last 72 hours.  PT/INR: No results for input(s): LABPROT, INR in the last 72 hours. ABG    Component Value Date/Time   PHART 7.490 (H) 04/26/2018 0535   HCO3 23.9 04/26/2018 0535   ACIDBASEDEF 0.4 04/24/2018 1306   O2SAT 97.7 04/26/2018 0535   CBG (last 3)  No results for input(s): GLUCAP in the last 72 hours.  Assessment/Plan: S/P Procedure(s) (LRB): VIDEO BRONCHOSCOPY WITH INSERTION OF INTERBRONCHIAL VALVE (IBV) (N/A) CHEST TUBE INSERTION (Left) -  Feels better this AM Has extreme tidal variation but no air coming out through tube.  CXR shows left upper lobe atelectasis from valves and a pleural space Will clamp CT and repeat CXR at noon.  Shoulder pain better   LOS: 19 days    [name] 05/14/2018

## 2018-05-14 NOTE — Care Management Important Message (Signed)
Important Message  Patient Details  Name: [name] MRN: [id number] Date of Birth: 04/10/1951   Medicare Important Message Given:  Yes    Antonios Ostrow P Jamiesha Victoria 05/14/2018, 12:05 PM

## 2018-05-14 NOTE — Plan of Care (Signed)
  Problem: Clinical Measurements: Goal: Postoperative complications will be avoided or minimized Outcome: Progressing   Problem: Respiratory: Goal: Respiratory status will improve Outcome: Progressing   Problem: Skin Integrity: Goal: Wound healing without signs and symptoms infection will improve Outcome: Progressing   Problem: Health Behavior/Discharge Planning: Goal: Ability to manage health-related needs will improve Outcome: Progressing   Problem: Clinical Measurements: Goal: Ability to maintain clinical measurements within normal limits will improve Outcome: Progressing Goal: Will remain free from infection Outcome: Progressing Goal: Diagnostic test results will improve Outcome: Progressing Goal: Respiratory complications will improve Outcome: Progressing Goal: Cardiovascular complication will be avoided Outcome: Progressing   Problem: Coping: Goal: Level of anxiety will decrease Outcome: Progressing   Problem: Pain Managment: Goal: General experience of comfort will improve Outcome: Progressing   Problem: Skin Integrity: Goal: Risk for impaired skin integrity will decrease Outcome: Progressing

## 2018-05-14 NOTE — Progress Notes (Signed)
      [address]       [place] [address]             [phone]      No complaints with CT "clamped" BP 131/82 (BP Location: Left Arm)   Pulse (!) 114   Temp 98.2 F (36.8 C) (Oral)   Resp 20   Ht 5\' 4"  (1.626 m)   Wt 65.7 kg   SpO2 97%   BMI 24.87 kg/m   CXR actually looks a little better with some aeration in left upper lobe, small space unchanged  Remo Lipps C. Roxan Hockey, MD [facility] [phone]

## 2018-05-14 NOTE — Evaluation (Signed)
Physical Therapy Evaluation Patient Details Name: [name] MRN: [id number] DOB: [birth year] Today's Date: 05/14/2018   History of Present Illness  Patient is a 68 yo female s/p VIDEO ASSISTED THORACOSCOPY (VATS)LEFT LOWER LOBECTOMY, NODE DISSECTION (Left)  Clinical Impression  Patient seen for therapy assessment for deconditioning. Patient is mobilizing well with no noted focal deficits at this time. Patient has been ambulating with husband and staff around the units. Ambulated in halls of hospital without device or assist. VSS on room air with activity. Patient had questions regarding shoulder deficits (from prior to admission) feel patient would benefit from outpatient PT referral once cleared for higher level strengthening activities. Patient otherwise caring for herself quite well, ambulating to the bathroom, performing functional tasks, transfers and all aspects of mobility without any therapist intervention. Do no feel patient requires any further acute PT needs. Encouraged continued mobility with nsg staff and/or family. Will sign off.     Follow Up Recommendations Outpatient PT(re:shoulder follow up)    Equipment Recommendations  None recommended by PT    Recommendations for Other Services       Precautions / Restrictions Precautions Precaution Comments: watch O2 saturations      Mobility  Bed Mobility               General bed mobility comments: received in chair  Transfers Overall transfer level: Needs assistance Equipment used: None Transfers: Sit to/from Stand Sit to Stand: Supervision         General transfer comment: supervision for line management chest tube, no physical assist or cues required  Ambulation/Gait Ambulation/Gait assistance: Supervision Gait Distance (Feet): 360 Feet Assistive device: None Gait Pattern/deviations: Narrow base of support Gait velocity: decreased Gait velocity interpretation: 1.31 - 2.62 ft/sec, indicative of  limited community ambulator General Gait Details: One standing rest break (patient initiated for approx 10 seconds to take deep breaths). No significant instability. Patient cued for increased cadence  Stairs            Wheelchair Mobility    Modified Rankin (Stroke Patients Only)       Balance Overall balance assessment: Mild deficits observed, not formally tested                                           Pertinent Vitals/Pain Pain Assessment: Faces Faces Pain Scale: Hurts little more Pain Location: left shoulder pain Pain Descriptors / Indicators: Sore Pain Intervention(s): Monitored during session    Home Living Family/patient expects to be discharged to:: Private residence Living Arrangements: Spouse/significant other Available Help at Discharge: Family Type of Home: House Home Access: Stairs to enter   Technical brewer of Steps: 2 Home Layout: Able to live on main level with bedroom/bathroom        Prior Function Level of Independence: Independent               Hand Dominance   Dominant Hand: Right    Extremity/Trunk Assessment        Lower Extremity Assessment Lower Extremity Assessment: Overall WFL for tasks assessed       Communication   Communication: No difficulties  Cognition Arousal/Alertness: Awake/alert Behavior During Therapy: WFL for tasks assessed/performed Overall Cognitive Status: Within Functional Limits for tasks assessed  General Comments      Exercises     Assessment/Plan    PT Assessment Patent does not need any further PT services  PT Problem List         PT Treatment Interventions      PT Goals (Current goals can be found in the Care Plan section)  Acute Rehab PT Goals PT Goal Formulation: All assessment and education complete, DC therapy    Frequency     Barriers to discharge        Co-evaluation                AM-PAC PT "6 Clicks" Mobility  Outcome Measure Help needed turning from your back to your side while in a flat bed without using bedrails?: None Help needed moving from lying on your back to sitting on the side of a flat bed without using bedrails?: None Help needed moving to and from a bed to a chair (including a wheelchair)?: None Help needed standing up from a chair using your arms (e.g., wheelchair or bedside chair)?: None Help needed to walk in hospital room?: A Little Help needed climbing 3-5 steps with a railing? : A Little 6 Click Score: 22    End of Session   Activity Tolerance: Patient tolerated treatment well Patient left: in chair;with call bell/phone within reach Nurse Communication: Mobility status PT Visit Diagnosis: Difficulty in walking, not elsewhere classified (R26.2)    Time: 3943-2003 PT Time Calculation (min) (ACUTE ONLY): 21 min   Charges:   PT Evaluation $PT Eval Moderate Complexity: 1 Mod          Alben Deeds, PT DPT  Board Certified Neurologic Specialist Acute Rehabilitation Services Pager [phone] Office [phone]   Duncan Dull 05/14/2018, 4:13 PM

## 2018-05-14 NOTE — Progress Notes (Signed)
Radiology called to advise of results of chest xray; Pt assessed; No SOB, SP02 97-99%; Pt does not have complaints of pain. VSS at this time.  No change in subcutaneous air.  RN will continue to monitor.

## 2018-05-14 NOTE — Progress Notes (Signed)
Pt concerned about noise as she breaths.  Pt advises provider was to order breathing treatment. Contacted respiratory to take a look at patient. RN will continue to monitor.

## 2018-05-15 ENCOUNTER — Inpatient Hospital Stay (HOSPITAL_COMMUNITY): Payer: Medicare Other

## 2018-05-15 MED ORDER — POTASSIUM CHLORIDE 2 MEQ/ML IV SOLN
INTRAVENOUS | Status: DC
  Filled 2018-05-15 (×4): qty 1000

## 2018-05-15 MED ORDER — KCL-LACTATED RINGERS 20 MEQ/L IV SOLN
INTRAVENOUS | Status: DC
  Filled 2018-05-15 (×3): qty 1000

## 2018-05-15 MED ORDER — KCL-LACTATED RINGERS 20 MEQ/L IV SOLN: INTRAVENOUS | Status: DC

## 2018-05-15 NOTE — Progress Notes (Signed)
      [address]       [place] [address]             [phone]      Catheter removed earlier today  Post removal CXR ok  She felt some "air moving or wheezing" about an hour after tube removed but has felt fine since then.  Will recheck CXR in AM  Annais Crafts C. Roxan Hockey, MD [facility] [phone]

## 2018-05-15 NOTE — Progress Notes (Signed)
Left pleural pigtail catheter removed and cover site with occlusive dressing. Pt tolerated well. VS stable. Will continue to monitor pt.

## 2018-05-15 NOTE — Progress Notes (Addendum)
5 Days Post-Op Procedure(s) (LRB): VIDEO BRONCHOSCOPY WITH INSERTION OF INTERBRONCHIAL VALVE (IBV) (N/A) CHEST TUBE INSERTION (Left) Subjective: Reported having one episode of  Blood-stained sputum last night.  Otherwise doing well with no new problems.  The left pigtail catheter has been clamped for 24 hours.    Objective: Vital signs in last 24 hours: Temp:  [98.2 F (36.8 C)-98.9 F (37.2 C)] 98.4 F (36.9 C) (01/27 1900) Pulse Rate:  [92-114] 92 (01/27 1900) Cardiac Rhythm: Sinus tachycardia (01/28 0700) Resp:  [20-28] 28 (01/27 1900) BP: (108-131)/(61-82) 115/67 (01/27 1900) SpO2:  [97 %-98 %] 98 % (01/27 1900)  Hemodynamic parameters for last 24 hours:    Intake/Output from previous day: 01/27 0701 - 01/28 0700 In: 360 [P.O.:360] Out: 60 [Chest Tube:60] Intake/Output this shift: No intake/output data recorded.  General appearance: alert, cooperative and no distress Heart: regular rate and rhythm Lungs: Breath sounds are clear on the right, diminished as expected on left. [place] on the pigtail catheter was opened and [place] observed to have appropriate tidling.  No air leak Wound: Pigtail catheter dressing dry.  Lab Results: No results for input(s): WBC, HGB, HCT, PLT in the last 72 hours. BMET: No results for input(s): NA, K, CL, CO2, GLUCOSE, BUN, CREATININE, CALCIUM in the last 72 hours.  PT/INR: No results for input(s): LABPROT, INR in the last 72 hours. ABG    Component Value Date/Time   PHART 7.490 (H) 04/26/2018 0535   HCO3 23.9 04/26/2018 0535   ACIDBASEDEF 0.4 04/24/2018 1306   O2SAT 97.7 04/26/2018 0535   CBG (last 3)  No results for input(s): GLUCAP in the last 72 hours.  Assessment/Plan: S/P Procedure(s) (LRB): VIDEO BRONCHOSCOPY WITH INSERTION OF INTERBRONCHIAL VALVE (IBV) (N/A) CHEST TUBE INSERTION (Left)  -POD20 left lower lobectomy for T2bN0 adenocarcinoma with prolonged air leak. -POD5 video bronchoscopy with insertion of interbronchial  valve and placement of left pleural pigtail catheter.  She is clinically stable and CXR shows no increase of left pleural air space and no increase in subQ air.  Will remove pigtail catheter today.  Followup CXR later today and in AM. -Shoulder pain- continues to improve -Arixtra for DVT PPX. -Continue PT    LOS: 20 days    Antony Odea, PA-C [phone] 05/15/2018 Patient seen and examined along with Mr. Michael Litter Agree with above Pigtail clamped for 24 hours with no appreciable problems No air leak when catheter reopened- will dc today Keep in hospital another 24 hours for observation  Remo Lipps C. Roxan Hockey, MD [facility] [phone]

## 2018-05-16 ENCOUNTER — Inpatient Hospital Stay (HOSPITAL_COMMUNITY): Payer: Medicare Other

## 2018-05-16 MED ORDER — TRAMADOL HCL 50 MG PO TABS: 50.0000 mg | ORAL_TABLET | Freq: Four times a day (QID) | ORAL | 0 refills | Status: DC | PRN

## 2018-05-16 NOTE — Progress Notes (Signed)
6 Days Post-Op Procedure(s) (LRB): VIDEO BRONCHOSCOPY WITH INSERTION OF INTERBRONCHIAL VALVE (IBV) (N/A) CHEST TUBE INSERTION (Left) Subjective: Rested well last night, no new problems. Pain controlled. Had one more episode of blood-streaked clear sputum. No increase in shortness of breath since CT removed.  Objective: Vital signs in last 24 hours: Temp:  [98.1 F (36.7 C)-98.7 F (37.1 C)] 98.7 F (37.1 C) (01/29 0815) Pulse Rate:  [95-107] 107 (01/29 0815) Cardiac Rhythm: Normal sinus rhythm (01/29 0815) Resp:  [23] 23 (01/29 0342) BP: (99-144)/(75-77) 99/76 (01/29 0815) SpO2:  [96 %-98 %] 98 % (01/29 0815)    Intake/Output from previous day: 01/28 0701 - 01/29 0700 In: 240 [P.O.:240] Out: -  Intake/Output this shift: Total I/O In: 120 [P.O.:120] Out: -   General appearance: alert, cooperative and no distress Heart: regular rate and rhythm Lungs: Breath sounds are clear on the right, diminished as expected on left. SubQ air continues to diminish.   CXR reviewed-No change in the left apical air space, the aeration of the left lung is improved wince removal of the pigtail catheter Wound: The incision is well approximated, clean and dry.  The CT dressing ins clean and dry.  Lab Results: No results for input(s): WBC, HGB, HCT, PLT in the last 72 hours. BMET: No results for input(s): NA, K, CL, CO2, GLUCOSE, BUN, CREATININE, CALCIUM in the last 72 hours.  PT/INR: No results for input(s): LABPROT, INR in the last 72 hours. ABG    Component Value Date/Time   PHART 7.490 (H) 04/26/2018 0535   HCO3 23.9 04/26/2018 0535   ACIDBASEDEF 0.4 04/24/2018 1306   O2SAT 97.7 04/26/2018 0535   CBG (last 3)  No results for input(s): GLUCAP in the last 72 hours.  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 -POD6 video bronchoscopy with insertion of interbronchial valve and placement of left pleural pigtail catheter.   The pigtail catheter was removed yesterday after a 24 hour clamp trial.  Followup CXR 's yesterday and this am are satisfactory. -Shoulder pain- continues to improve -Arixtra for DVT PPX. -Discharge to home today.  -Continue PT as outpatient with [facility] in [place] per her request for general strengthening and reconditioning and also for evaluation and treatment of right lower extremity discomfort.  LOS: 21 days    Antony Odea, [place] [phone] 05/16/2018

## 2018-05-16 NOTE — Progress Notes (Signed)
Discharged home by wheel chair accompanied by husband, discharge instructions given to pt. Home med crestor 20 pcs taken from pharmacy and returned to pt. Belongings taken home.

## 2018-05-16 NOTE — Care Management Note (Addendum)
Case Management Note  Patient Details  Name: [name] MRN: [id number] Date of Birth: April 07, 1951  Subjective/Objective:   Pt is s/p VATS                 Action/Plan:   PTA independent from home with husband.     Expected Discharge Date:  04/29/18               Expected Discharge Plan:  Home/Self Care(From home with husband, has PCP)  In-House Referral:     Discharge planning Services  CM Consult  Post Acute Care Choice:    Choice offered to:     DME Arranged:    DME Agency:     HH Arranged:    HH Agency:     Status of Service:  In process, will continue to follow  If discussed at Long Length of Stay Meetings, dates discussed:    Additional Comments: 05/16/2018  Pt deemed appropriate for discharge back to [place] today with husband.  Attending group to discontinue HHRN order as pt has been deemed to not need it by the group. Pt also not interested in [facility]   Pt only interested in resumption of outpt PT in [place] requested paper script for outpt PT -  pt to take paper script to current facility in [place].  Pt confirmed she has PCP and denied barriers with paying for medications  05/11/18 Pt now has pigtail and still displays crepitus.  Pt independently ambulates around unit.  Pt may benefit from [facility] at discharge -CM will continue to follow for discharge needs  05/08/18 Pt continues to have chest tube - will be placed to water seal today.  CM will continue to follow Maryclare Labrador, RN 05/16/2018, 11:43 AM

## 2018-05-17 ENCOUNTER — Encounter: Payer: Self-pay | Admitting: *Deleted

## 2018-05-17 NOTE — Care Management (Signed)
CM received call from pt.  Pt now interested in [facility] - orders written prior to discharge. CM explained that pt will not be able to utilize both  [facility] and outpt PT - pt acknowledged barrier and chooses to peruse [facility] at this time and will provide outpt PT prescription when outpt therapy is appropriate.   Pt given choice via phone of medicare.gov HH agencies in her local area - [facility] chosen - agency contacted and referral accepted.  CM provided [facility] contact information to pt .  No other CM needs

## 2018-05-19 DIAGNOSIS — C3492 Malignant neoplasm of unspecified part of left bronchus or lung: Secondary | ICD-10-CM | POA: Diagnosis not present

## 2018-05-21 ENCOUNTER — Other Ambulatory Visit: Payer: Self-pay | Admitting: *Deleted

## 2018-05-21 ENCOUNTER — Other Ambulatory Visit: Payer: Self-pay

## 2018-05-21 ENCOUNTER — Telehealth: Payer: Self-pay

## 2018-05-21 DIAGNOSIS — C3492 Malignant neoplasm of unspecified part of left bronchus or lung: Secondary | ICD-10-CM

## 2018-05-21 NOTE — Telephone Encounter (Signed)
Patient contacted the office regarding her post-op appointment with Dr. Roxan Hockey.  She stated she was advised to have a chest xray before her appointment.  Order was placed for xray at [facility].  Patient made aware and advised to arrive 30- 45 minutes early to make sure she was able to get her xray and make it to her follow-up appointment.  Patient acknowledged receipt.

## 2018-05-23 ENCOUNTER — Encounter: Payer: Self-pay | Admitting: *Deleted

## 2018-05-23 ENCOUNTER — Inpatient Hospital Stay (HOSPITAL_COMMUNITY): Payer: Medicare Other

## 2018-05-23 ENCOUNTER — Telehealth: Payer: Self-pay | Admitting: *Deleted

## 2018-05-23 ENCOUNTER — Other Ambulatory Visit: Payer: Self-pay | Admitting: Medical Oncology

## 2018-05-23 ENCOUNTER — Inpatient Hospital Stay (HOSPITAL_COMMUNITY)
Admission: AD | Admit: 2018-05-23 | Discharge: 2018-05-25 | DRG: 187 | Disposition: A | Discharge status: Home / Self Care | Payer: Medicare Other | Source: Ambulatory Visit | Attending: Thoracic Surgery (Cardiothoracic Vascular Surgery) | Admitting: Thoracic Surgery (Cardiothoracic Vascular Surgery)

## 2018-05-23 DIAGNOSIS — Z888 Allergy status to other drugs, medicaments and biological substances status: Secondary | ICD-10-CM

## 2018-05-23 DIAGNOSIS — K589 Irritable bowel syndrome without diarrhea: Secondary | ICD-10-CM | POA: Diagnosis present

## 2018-05-23 DIAGNOSIS — Z91048 Other nonmedicinal substance allergy status: Secondary | ICD-10-CM

## 2018-05-23 DIAGNOSIS — Z9109 Other allergy status, other than to drugs and biological substances: Secondary | ICD-10-CM | POA: Diagnosis not present

## 2018-05-23 DIAGNOSIS — I10 Essential (primary) hypertension: Secondary | ICD-10-CM | POA: Diagnosis present

## 2018-05-23 DIAGNOSIS — Z887 Allergy status to serum and vaccine status: Secondary | ICD-10-CM | POA: Diagnosis not present

## 2018-05-23 DIAGNOSIS — J9811 Atelectasis: Secondary | ICD-10-CM | POA: Diagnosis present

## 2018-05-23 DIAGNOSIS — Z880 Allergy status to penicillin: Secondary | ICD-10-CM | POA: Diagnosis not present

## 2018-05-23 DIAGNOSIS — J90 Pleural effusion, not elsewhere classified: Principal | ICD-10-CM | POA: Insufficient documentation

## 2018-05-23 DIAGNOSIS — E785 Hyperlipidemia, unspecified: Secondary | ICD-10-CM | POA: Diagnosis present

## 2018-05-23 DIAGNOSIS — Z8249 Family history of ischemic heart disease and other diseases of the circulatory system: Secondary | ICD-10-CM

## 2018-05-23 DIAGNOSIS — Z91041 Radiographic dye allergy status: Secondary | ICD-10-CM | POA: Diagnosis not present

## 2018-05-23 DIAGNOSIS — C3490 Malignant neoplasm of unspecified part of unspecified bronchus or lung: Secondary | ICD-10-CM

## 2018-05-23 DIAGNOSIS — Z801 Family history of malignant neoplasm of trachea, bronchus and lung: Secondary | ICD-10-CM

## 2018-05-23 DIAGNOSIS — Z91018 Allergy to other foods: Secondary | ICD-10-CM | POA: Diagnosis not present

## 2018-05-23 DIAGNOSIS — Z85118 Personal history of other malignant neoplasm of bronchus and lung: Secondary | ICD-10-CM

## 2018-05-23 DIAGNOSIS — Z8349 Family history of other endocrine, nutritional and metabolic diseases: Secondary | ICD-10-CM | POA: Diagnosis not present

## 2018-05-23 DIAGNOSIS — Z882 Allergy status to sulfonamides status: Secondary | ICD-10-CM | POA: Diagnosis not present

## 2018-05-23 DIAGNOSIS — K219 Gastro-esophageal reflux disease without esophagitis: Secondary | ICD-10-CM | POA: Diagnosis present

## 2018-05-23 DIAGNOSIS — J189 Pneumonia, unspecified organism: Secondary | ICD-10-CM | POA: Diagnosis present

## 2018-05-23 DIAGNOSIS — Z91013 Allergy to seafood: Secondary | ICD-10-CM

## 2018-05-23 DIAGNOSIS — C3492 Malignant neoplasm of unspecified part of left bronchus or lung: Secondary | ICD-10-CM

## 2018-05-23 DIAGNOSIS — Z902 Acquired absence of lung [part of]: Secondary | ICD-10-CM

## 2018-05-23 MED ORDER — TRAMADOL HCL 50 MG PO TABS: 50.0000 mg | ORAL_TABLET | Freq: Four times a day (QID) | ORAL | Status: DC | PRN

## 2018-05-23 MED ORDER — ACETAMINOPHEN 325 MG PO TABS: 650.0000 mg | ORAL_TABLET | Freq: Four times a day (QID) | ORAL | Status: DC | PRN

## 2018-05-23 MED ORDER — DIPHENHYDRAMINE HCL 12.5 MG/5ML PO LIQD
12.5000 mg | Freq: Four times a day (QID) | ORAL | Status: DC | PRN
  Administered 2018-05-23: 25 mg via ORAL
  Filled 2018-05-23: qty 5
  Filled 2018-05-23: qty 10
  Filled 2018-05-23: qty 5
  Filled 2018-05-23: qty 10

## 2018-05-23 MED ORDER — FONDAPARINUX SODIUM 10 MG/0.8ML ~~LOC~~ SOLN
2.5000 mg | SUBCUTANEOUS | Status: DC
  Filled 2018-05-23: qty 0.8

## 2018-05-23 MED ORDER — ROSUVASTATIN CALCIUM 5 MG PO TABS: 5.0000 mg | ORAL_TABLET | Freq: Every day | ORAL | Status: DC

## 2018-05-23 MED ORDER — LIDOCAINE HCL (PF) 1 % IJ SOLN: 2.0000 mL | Freq: Once | INTRAMUSCULAR | Status: DC

## 2018-05-23 MED ORDER — ONDANSETRON HCL 4 MG/2ML IJ SOLN: 4.0000 mg | Freq: Four times a day (QID) | INTRAMUSCULAR | Status: DC | PRN

## 2018-05-23 MED ORDER — FONDAPARINUX SODIUM 10 MG/0.8ML ~~LOC~~ SOLN: 7.5000 mg | SUBCUTANEOUS | Status: DC

## 2018-05-23 MED ORDER — VANCOMYCIN HCL 10 G IV SOLR
1250.0000 mg | Freq: Once | INTRAVENOUS | Status: AC
  Administered 2018-05-23: 1250 mg via INTRAVENOUS
  Filled 2018-05-23: qty 1250

## 2018-05-23 MED ORDER — FONDAPARINUX SODIUM 10 MG/0.8ML ~~LOC~~ SOLN
7.5000 mg | SUBCUTANEOUS | Status: DC
  Administered 2018-05-23: 7.5 mg via SUBCUTANEOUS
  Filled 2018-05-23: qty 0.8

## 2018-05-23 MED ORDER — HYDROMORPHONE HCL 2 MG PO TABS
2.0000 mg | ORAL_TABLET | ORAL | Status: DC | PRN
  Filled 2018-05-23: qty 1

## 2018-05-23 MED ORDER — DOCUSATE SODIUM 100 MG PO CAPS: 100.0000 mg | ORAL_CAPSULE | Freq: Two times a day (BID) | ORAL | Status: DC | PRN

## 2018-05-23 MED ORDER — ACETAMINOPHEN 160 MG/5ML PO SOLN
650.0000 mg | Freq: Four times a day (QID) | ORAL | Status: DC | PRN
  Filled 2018-05-23: qty 20.3

## 2018-05-23 NOTE — Telephone Encounter (Signed)
[name] has called to relate some new issues of concern since her discharge last week.Marland Kitchen She was admitted 04/25/18 for a LLLobectomy with Insertion of Bronchial Valves on 05/10/18 and was discharged on 05/16/18.  Now she says she hears air/whoo sing in her left side from top to bottom, which is new.  Her O2 sats are dropping, now 91%.  Her BP is elevated 744/514 systolic.  She is unable to swallow liquids completely. She has an appointment tomorrow at [facility].  She is very anxious and doesn't know what to do.  Since she lives in [place], I suggested she go to a medical facility for a CXR.  I said I would fax her operative record when contacted. If needed.  She reluctantly agreed.

## 2018-05-23 NOTE — Progress Notes (Addendum)
Pharmacy Antibiotic Note  [name] is a 68 y.o. female admitted on 05/23/2018 with pneumonia.  Pharmacy has been consulted for vancomycin dosing.  Underwent L VATS on 05/24/81 which was complicated with a pneumothorax. Has multiple abx allergies. Was hearing air sounds and having drops in her O2 saturations- CXR showed evidence of PNA. Last Scr came back at 0.89 on 04/27/2018.   Plan: Vancomycin 1250 mg once   Monitor BMP with morning labs to schedule maintenance dose Monitor renal fx, cx results, and clinical pic  Height: 5\' 3"  (160 cm) Weight: 141 lb 1.5 oz (64 kg) IBW/kg (Calculated) : 52.4  Temp (24hrs), Avg:98.6 F (37 C), Min:98.6 F (37 C), Max:98.6 F (37 C)  No results for input(s): WBC, CREATININE, LATICACIDVEN, VANCOTROUGH, VANCOPEAK, VANCORANDOM, GENTTROUGH, GENTPEAK, GENTRANDOM, TOBRATROUGH, TOBRAPEAK, TOBRARND, AMIKACINPEAK, AMIKACINTROU, AMIKACIN in the last 168 hours.  CrCl cannot be calculated (Patient's most recent lab result is older than the maximum 21 days allowed.).    Allergies  Allergen Reactions  . Influenza Vaccines Shortness Of Breath    Couldn't breathe  . Penicillins Hives and Other (See Comments)    DID THE REACTION INVOLVE: Swelling of the face/tongue/throat, SOB, or low BP? Unknown Sudden or severe rash/hives, skin peeling, or the inside of the mouth or nose? Unknown Did it require medical treatment? #  #  #  YES  #  #  #  When did it last happen? Teenager If all above answers are "NO", may proceed with cephalosporin use.   . Proton Pump Inhibitors Anaphylaxis  . Sulfites Anaphylaxis, Swelling and Other (See Comments)    Turn red and throat swells  . Wasp Venom Protein Swelling  . Aspartame Other (See Comments)    Severe diarrhea  . Avocado Other (See Comments)    Chest tightness, nauseous  . Cefaclor Hives  . Chlorhexidine Other (See Comments)    Dizziness, issues with use on skin as burns her skin up  . Chloroxine Other (See Comments)     Severe migraine, chemical cleaning products  . Chocolate Other (See Comments)    Acne  . Ciprofloxacin Hcl Hives  . Clarithromycin Hives  . Gatifloxacin Hives  . Ivp Dye [Iodinated Diagnostic Agents] Other (See Comments)    Acne  . Ketorolac Other (See Comments)    Extreme dizziness and fatigue  . Lactase Other (See Comments)    Unknown  . Molds & Smuts Other (See Comments)    Unknown  . Nexium [Esomeprazole Magnesium] Other (See Comments)    Throat spasms  . Other Other (See Comments)    Grass and Trees, Maple, [place], [place], [place] mix, [place], [place], Rag weed, [place], Pollen, Dog and cats, Technical sales engineer and Down. ALL ANTIBIOTICS CAUSE HIVES EXCEPT Z-PAKS  . Pork-Derived Products Other (See Comments)    religious  . Prednisolone Other (See Comments)    Extreme dizziness and fatigue  . Shellfish Allergy Other (See Comments)    Acne  . Soap Other (See Comments)    Soft soap- severe dermatitis   Soft Soap  Brand name  . Sulfa Antibiotics Hives  . Sulfamethoxazole-Trimethoprim Hives  . Zantac [Ranitidine Hcl] Other (See Comments)    Severe throat spasms    Antimicrobials this admission: Vancomycin 2/5 >>   Dose adjustments this admission: N/A  Microbiology results: N/A  Thank you for allowing pharmacy to be a part of this patient's care.  Antonietta Jewel, PharmD, [place] Clinical Pharmacist  Pager: [phone] Phone: [phone] 05/23/2018 8:30 PM

## 2018-05-23 NOTE — Progress Notes (Signed)
Oncology Nurse Navigator Documentation  Oncology Nurse Navigator Flowsheets 05/23/2018  Navigator Location [facility]  Navigator Encounter Type Other/I received a call from Dr. Leonarda Salon office. Ms. Guillot will be admitted to hospital today and wont be at clinic tomorrow. I cancelled her appt with Dr. [name].   Barriers/Navigation Needs Coordination of Care  Interventions Coordination of Care  Coordination of Care Other  Acuity Level 2  Time Spent with Patient 15

## 2018-05-23 NOTE — H&P (Addendum)
[address]       [place] [address]             [phone]      Subjective:   [name] is a 68 yo white female well known to [facility].  She has multiple drug allergies and medical history of HTN, Hyperlipidemia, Migraines, anemia, IBS, GERD, and Vitamin D Deficiency.  She is S/P Left VATS with left lower lobectomy, mediastinal lymph node dissection, and intercostal nerve block performed on 04/25/2018.  She had a complicated hospital stay due to persistent air leak and development of extensive subcutaneous emphysema.  Initially this improved and her chest tube was removed on 05/09/2018.  Unfortunately she developed a pneumothorax and worsening appearance of subcutaneous emphysema.  She ultimately was taken back to the operating room and underwent placement of Inter bronchial valves and placement of a left sided pigtail chest tube catheter on 05/10/2018.  The patient tolerated the procedure without difficulty.  Initially the patient again developed a pneumothorax and required her chest tube to be placed on suction.  She was slowly weaned off suction and her chest tube was able to be removed on 05/12/2018.  She was discharged home on 05/16/2018.  The patient contacted the office today and stated she has not been feeling well.  She states that she hears an air sound on her left side from top to bottom.  She also mentions that her saturation levels have decreased.   She was instructed to get a CXR which showed evidence of pneumonia and it was felt admission was indicated.  She states that she has been feeling poorly over the last several days.  She denies fever, chest pain.  She does have some mild shortness of breath.  She has been walking at home without difficulty.  She has had a decrease in her appetite over the past several days.  Her incisions are healing without evidence of infection.  She states her biggest issues has been a "wooshing" sound when she lays down.    Patient Active  Problem List   Diagnosis Date Noted  . HCAP (healthcare-associated pneumonia) 05/23/2018  . S/P lobectomy of lung, left lower lobectomy 04/25/2018  . Acute bronchitis 04/05/2018  . Adenocarcinoma, lung ([place]) 03/01/2018   Past Medical History:  Diagnosis Date  . Anemia   . Atherosclerosis   . GERD (gastroesophageal reflux disease)   . Hemoptysis   . Hyperlipidemia   . Hypertension   . IBS (irritable bowel syndrome)   . Lung mass   . Migraines   . Spondylosis   . Vitamin D deficiency     Past Surgical History:  Procedure Laterality Date  . BROW LIFT AND BLEPHAROPLASTY  03/15/2013  . CATARACT EXTRACTION, BILATERAL  06/23/2014  . CHEST TUBE INSERTION Left 05/10/2018   Procedure: CHEST TUBE INSERTION;  Surgeon: [name], MD;  Location: [facility];  Service: Thoracic;  Laterality: Left;  . COLONOSCOPY  05/2014  . ESOPHAGOGASTRODUODENOSCOPY  2019  . EYE SURGERY    . TONSILLECTOMY    . VIDEO ASSISTED THORACOSCOPY (VATS)/ LOBECTOMY Left 04/25/2018   Procedure: VIDEO ASSISTED THORACOSCOPY (VATS)LEFT LOWER LOBECTOMY, NODE DISSECTION;  Surgeon: [name], MD;  Location: [facility];  Service: Thoracic;  Laterality: Left;  Marland Kitchen VIDEO BRONCHOSCOPY WITH ENDOBRONCHIAL NAVIGATION N/A 03/07/2018   Procedure: VIDEO BRONCHOSCOPY WITH ENDOBRONCHIAL NAVIGATION;  Surgeon: Collene Gobble, MD;  Location: [facility];  Service: Thoracic;  Laterality: N/A;  . VIDEO  BRONCHOSCOPY WITH INSERTION OF INTERBRONCHIAL VALVE (IBV) N/A 05/10/2018   Procedure: VIDEO BRONCHOSCOPY WITH INSERTION OF INTERBRONCHIAL VALVE (IBV);  Surgeon: [name], MD;  Location: [facility];  Service: Thoracic;  Laterality: N/A;    No medications prior to admission.   Allergies  Allergen Reactions  . Influenza Vaccines Shortness Of Breath    Couldn't breathe  . Penicillins Hives and Other (See Comments)    DID THE REACTION INVOLVE: Swelling of the face/tongue/throat, SOB, or low BP? Unknown Sudden or severe rash/hives,  skin peeling, or the inside of the mouth or nose? Unknown Did it require medical treatment? #  #  #  YES  #  #  #  When did it last happen? Teenager If all above answers are "NO", may proceed with cephalosporin use.   . Proton Pump Inhibitors Anaphylaxis  . Sulfites Anaphylaxis, Swelling and Other (See Comments)    Turn red and throat swells  . Wasp Venom Protein Swelling  . Aspartame Other (See Comments)    Severe diarrhea  . Avocado Other (See Comments)    Chest tightness, nauseous  . Cefaclor Hives  . Chlorhexidine Other (See Comments)    Dizziness, issues with use on skin as burns her skin up  . Chloroxine Other (See Comments)    Severe migraine, chemical cleaning products  . Chocolate Other (See Comments)    Acne  . Ciprofloxacin Hcl Hives  . Clarithromycin Hives  . Gatifloxacin Hives  . Ivp Dye [Iodinated Diagnostic Agents] Other (See Comments)    Acne  . Ketorolac Other (See Comments)    Extreme dizziness and fatigue  . Lactase Other (See Comments)    Unknown  . Molds & Smuts Other (See Comments)    Unknown  . Nexium [Esomeprazole Magnesium] Other (See Comments)    Throat spasms  . Other Other (See Comments)    Grass and Trees, Maple, [place], [place], [place] mix, [place], [place], Rag weed, [place], Pollen, Dog and cats, Technical sales engineer and Down. ALL ANTIBIOTICS CAUSE HIVES EXCEPT Z-PAKS  . Pork-Derived Products Other (See Comments)    religious  . Prednisolone Other (See Comments)    Extreme dizziness and fatigue  . Shellfish Allergy Other (See Comments)    Acne  . Soap Other (See Comments)    Soft soap- severe dermatitis   Soft Soap  Brand name  . Sulfa Antibiotics Hives  . Sulfamethoxazole-Trimethoprim Hives  . Zantac [Ranitidine Hcl] Other (See Comments)    Severe throat spasms    Social History   Tobacco Use  . Smoking status: Never Smoker  . Smokeless tobacco: Never Used  Substance Use Topics  . Alcohol use: Yes    Alcohol/week: 4.0 standard drinks    Types: 4  Glasses of wine per week    Comment: a week    Family History  Problem Relation Age of Onset  . Dementia Mother   . Hyperlipidemia Mother   . Hypertension Mother   . Thyroid disease Mother   . Irritable bowel syndrome Mother   . Aortic stenosis Mother   . Cancer Father   . Lung cancer Sister     Review of Systems  Constitutional: negative for chills, fevers and night sweats Respiratory: positive for shortness of breath, "wooshing" sound when laying flat Cardiovascular: negative for chest pain, chest pressure/discomfort, irregular heart beat and lower extremity edema Gastrointestinal: decreased appetite Skin: incisions without infection Neurological: negative  Objective:   Pulse (!) 106   Temp (P) 98.6 F (37  C) (Oral)   Resp 14   Ht 5\' 3"  (1.6 m)   Wt 64 kg   SpO2 97%   BMI 24.99 kg/m   Labs Reviewed from OSH: no leukocytosis, BMET okay  General appearance: alert, cooperative, no distress and looks tired Head: Normocephalic, without obvious abnormality, atraumatic Lungs: diminished breath sounds on left, no "wooshing" sound appreciated Heart: regular rate and rhythm and tachycardic Abdomen: soft, non-tender; bowel sounds normal; no masses,  no organomegaly Extremities: extremities normal, atraumatic, no cyanosis or edema Skin: surgical incisions well healed, sutures remain in place on chest tube sites Neurologic: Grossly normal   Assessment:   [name] is a 68 yo WF S/P Left VATS with LLL on 04/25/2018 and placement of IBV on 05/10/2018.  Contact office with complaints of feeling poorly.  CXR obtained and OSH is concerning for possible pneumonia.  Plan:    1. Possible HCAP- admit to inpatient 2. Pulm- S/P LLL, CXR concerning for pneumonia, will get CT of the chest to further evaluate fluid collection, start Vancomycin per pharmacy... ABX regimen will be difficult due to extensive allergy list 3. Pain control- prn Dilaudid, tylenol ordered 4. Arixtra for DVT  prophylaxis 5. Activty- as tolerated 6. Dispo- patient stable, will get CT chest to further evaluate left sided fluid collection, Vancomycin per pharmacy, Arixtra for DVT prophylaxis   Patient seen and examined, agree with above Presented yesterday with general malaise and unusual respiratory sensations and feeling of fluid "sloshing" in chest CXR showed opacification of lower left chest. Given her complicated postoperative course concern was for pneumonia or BPF/ empyema. CT shows a left pleural effusion. Appears relatively simple with no apparent loculations. No air bubbles or AF levels to suggest BPF Will do thoracentesis to drain fluid and send for tests  Remo Lipps C. Roxan Hockey, MD [facility] [phone]

## 2018-05-23 NOTE — Progress Notes (Signed)
Please refer to previous note from earlier in the day.  I received a call from Dr.Richard Wallie Char at an urgent clinic in [place], requesting to speak with her thoracic surgeon regarding her recent chest xray done at his facility.  I paged Dr. Roxan Hockey and gave him his provided number.  Dr. Roxan Hockey called me back in a few minutes with orders to admit Ms. [name] directly per her arrival from [place] with the diagnosis of R/O EMPYEMA, CT CHEST on arrival. Bed Placement gave her RM 2C09.  I called her with this information.  She was still at urgent care, going home to get a few things and then coming to the hospital.

## 2018-05-24 ENCOUNTER — Inpatient Hospital Stay: Payer: Medicare Other | Admitting: Internal Medicine

## 2018-05-24 ENCOUNTER — Other Ambulatory Visit: Payer: Self-pay

## 2018-05-24 ENCOUNTER — Inpatient Hospital Stay (HOSPITAL_COMMUNITY): Payer: Medicare Other

## 2018-05-24 ENCOUNTER — Inpatient Hospital Stay: Payer: Medicare Other

## 2018-05-24 ENCOUNTER — Ambulatory Visit: Payer: Medicare Other | Admitting: Thoracic Surgery (Cardiothoracic Vascular Surgery)

## 2018-05-24 ENCOUNTER — Encounter (HOSPITAL_COMMUNITY): Payer: Self-pay | Admitting: *Deleted

## 2018-05-24 DIAGNOSIS — J90 Pleural effusion, not elsewhere classified: Secondary | ICD-10-CM

## 2018-05-24 HISTORY — PX: IR THORACENTESIS ASP PLEURAL SPACE W/IMG GUIDE: IMG5380

## 2018-05-24 LAB — BASIC METABOLIC PANEL
Anion gap: 7 (ref 5–15)
BUN: 12 mg/dL (ref 8–23)
CO2: 26 mmol/L (ref 22–32)
Calcium: 8.5 mg/dL — ABNORMAL LOW (ref 8.9–10.3)
Chloride: 106 mmol/L (ref 98–111)
Creatinine, Ser: 0.74 mg/dL (ref 0.44–1.00)
GFR calc Af Amer: >60 mL/min (ref >60)
GFR calc non Af Amer: >60 mL/min (ref >60)
Glucose, Bld: 110 mg/dL — ABNORMAL HIGH (ref 70–99)
Potassium: 3.9 mmol/L (ref 3.5–5.1)
Sodium: 139 mmol/L (ref 135–145)

## 2018-05-24 MED ORDER — ACETAMINOPHEN 160 MG/5ML PO SOLN
1000.0000 mg | Freq: Four times a day (QID) | ORAL | Status: DC | PRN
  Administered 2018-05-24: 1000 mg via ORAL

## 2018-05-24 MED ORDER — LIDOCAINE HCL 1 % IJ SOLN
INTRAMUSCULAR | Status: AC
  Filled 2018-05-24: qty 20

## 2018-05-24 MED ORDER — VANCOMYCIN HCL 10 G IV SOLR
1250.0000 mg | INTRAVENOUS | Status: DC
  Filled 2018-05-24 (×2): qty 1250

## 2018-05-24 MED ORDER — FONDAPARINUX SODIUM 2.5 MG/0.5ML ~~LOC~~ SOLN
2.5000 mg | Freq: Every day | SUBCUTANEOUS | Status: DC
  Administered 2018-05-24: 2.5 mg via SUBCUTANEOUS
  Filled 2018-05-24 (×2): qty 0.5

## 2018-05-24 MED ORDER — LIDOCAINE HCL (PF) 1 % IJ SOLN
INTRAMUSCULAR | Status: AC | PRN
  Administered 2018-05-24: 10 mL

## 2018-05-24 NOTE — Procedures (Signed)
PROCEDURE SUMMARY:  Successful US guided left thoracentesis. Yielded 475 mL of hazy amber fluid. Pt tolerated procedure well. No immediate complications.  Specimen was not sent for labs. CXR ordered.  EBL < 5 mL  Ascencion Dike PA-C 05/24/2018 10:17 AM

## 2018-05-24 NOTE — Progress Notes (Signed)
Pharmacy Antibiotic Note  [name] is a 68 y.o. female admitted on 05/23/2018 with pneumonia.  Pharmacy has been consulted for vancomycin dosing. Underwent L VATS on 0/2/54 which was complicated with a pneumothorax. Has multiple abx allergies. Was hearing air sounds and having drops in her O2 saturations- CXR showed evidence of PNA.   SCr this morning is 0.74, estimated AUC with noted dosing below is 536,  Plan: -Vancomycin 1250mg  IV q24h -Monitor vancomycin levels as indicated -Follow SCr  Height: 5\' 3"  (160 cm) Weight: 141 lb 1.5 oz (64 kg) IBW/kg (Calculated) : 52.4  Temp (24hrs), Avg:98.4 F (36.9 C), Min:98.2 F (36.8 C), Max:98.6 F (37 C)  Recent Labs  Lab 05/24/18 0626  CREATININE 0.74    Estimated Creatinine Clearance: 60.6 mL/min (by C-G formula based on SCr of 0.74 mg/dL).    Allergies  Allergen Reactions  . Influenza Vaccines Shortness Of Breath    Couldn't breathe  . Penicillins Hives and Other (See Comments)    DID THE REACTION INVOLVE: Swelling of the face/tongue/throat, SOB, or low BP? Unknown Sudden or severe rash/hives, skin peeling, or the inside of the mouth or nose? Unknown Did it require medical treatment? #  #  #  YES  #  #  #  When did it last happen? Teenager If all above answers are "NO", may proceed with cephalosporin use.   . Proton Pump Inhibitors Anaphylaxis  . Sulfites Anaphylaxis, Swelling and Other (See Comments)    Turn red and throat swells  . Wasp Venom Protein Swelling  . Aspartame Other (See Comments)    Severe diarrhea  . Avocado Other (See Comments)    Chest tightness, nauseous  . Cefaclor Hives  . Chlorhexidine Other (See Comments)    Dizziness, issues with use on skin as burns her skin up  . Chloroxine Other (See Comments)    Severe migraine, chemical cleaning products  . Chocolate Other (See Comments)    Acne  . Ciprofloxacin Hcl Hives  . Clarithromycin Hives  . Gatifloxacin Hives  . Ivp Dye [Iodinated Diagnostic  Agents] Other (See Comments)    Acne  . Ketorolac Other (See Comments)    Extreme dizziness and fatigue  . Lactase Other (See Comments)    Unknown  . Molds & Smuts Other (See Comments)    Unknown  . Nexium [Esomeprazole Magnesium] Other (See Comments)    Throat spasms  . Other Other (See Comments)    Grass and Trees, Maple, [place], [place], [place] mix, [place], [place], Rag weed, [place], Pollen, Dog and cats, Technical sales engineer and Down. ALL ANTIBIOTICS CAUSE HIVES EXCEPT Z-PAKS  . Pork-Derived Products Other (See Comments)    religious  . Prednisolone Other (See Comments)    Extreme dizziness and fatigue  . Shellfish Allergy Other (See Comments)    Acne  . Soap Other (See Comments)    Soft soap- severe dermatitis   Soft Soap  Brand name  . Sulfa Antibiotics Hives  . Sulfamethoxazole-Trimethoprim Hives  . Zantac [Ranitidine Hcl] Other (See Comments)    Severe throat spasms    Antimicrobials this admission: Vancomycin 2/5 >>   Dose adjustments this admission: N/A  Microbiology results: N/A  Thank you for allowing pharmacy to be a part of this patient's care.  Arrie Senate, PharmD, BCPS Clinical Pharmacist [phone] Please check AMION for all [facility] numbers 05/24/2018

## 2018-05-24 NOTE — Discharge Summary (Addendum)
Physician Discharge Summary  Patient ID: [name] MRN: [id number] DOB/AGE: [birth year] 68 y.o.  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 S/P lobectomy of lung, left lower lobectomy 04/25/2018  . Acute bronchitis 04/05/2018  . 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 Pleural effusion 05/23/2018  . S/P lobectomy of lung, left lower lobectomy 04/25/2018  . Acute bronchitis 04/05/2018  . Adenocarcinoma, lung ([facility]) 03/01/2018   Discharged Condition: good  History of Present Illness:  [name] is a 68 yo white female well known to [facility].  She has multiple drug allergies and medical history of HTN, Hyperlipidemia, Migraines, anemia, IBS, GERD, and Vitamin D Deficiency.  She is S/P Left VATS with left lower lobectomy, mediastinal lymph node dissection, and intercostal nerve block performed on 04/25/2018.  She had a complicated hospital stay due to persistent air leak and development of extensive subcutaneous emphysema.  Initially this improved and her chest tube was removed on 05/09/2018.  Unfortunately she developed a pneumothorax and worsening appearance of subcutaneous emphysema.  She ultimately was taken back to the operating room and underwent placement of Inter bronchial valves and placement of a left sided pigtail chest tube catheter on 05/10/2018.  The patient tolerated the procedure without difficulty.  Initially the patient again developed a pneumothorax and required her chest tube to be placed on suction.  She was slowly weaned off suction and her chest tube was able to be removed on 05/12/2018.  She was discharged home on 05/16/2018.  The patient contacted the office today and stated she has not been feeling well.   She states that she hears an air sound on her left side from top to bottom.  She also mentions that her saturation levels have decreased.   She was instructed to get a CXR which showed evidence of pneumonia and it was felt admission was indicated.  She states that she has been feeling poorly over the last several days.  She denies fever, chest pain.  She does have some mild shortness of breath.  She has been walking at home without difficulty.  She has had a decrease in her appetite over the past several days.  Her incisions are healing without evidence of infection.  She states her biggest issues has been a "wooshing" sound when she lays down.   Hospital Course:   During her hospital stay she underwent CT scan of the chest which showed evidence of a moderate to large left sided pleural effusion with atelectasis.  Due to this she was set up for Thoracentesis during which 475 ml of fluid was removed.  Follow up CXR is free from pneumothorax.  Her pleural effusion is improved, and atelectasis persists. She has remained afebrile during her stay.  She has a normal white count.  She is ambulating with minimal difficulty.  She is medically stable for discharge home today.    Consults: IR  Significant Diagnostic Studies: radiology:   CT scan:  1. Postoperative partial left lung resection. Moderate-sized left pleural effusion with basilar atelectasis. Opacification of left lower lobe bronchi may represent secretions. 2. Prominent subcutaneous emphysema in the left chest wall, left axilla, and left flank. Small amount of gas in the left anterior mediastinal fat. 3. Small pericardial effusion.  Treatments:  procedures: thoracentesis   Discharge Exam: Blood pressure 137/75, pulse 91, temperature 98 F (36.7 C), temperature source Oral, resp. rate 18, height 5\' 3"  (1.6 m), weight 64 kg, SpO2 98 %.  General appearance: alert, cooperative and no distress Heart: regular rate and rhythm Lungs: diminished  breath sounds bibasilar Abdomen: soft, non-tender; bowel sounds normal; no masses,  no organomegaly Extremities: extremities normal, atraumatic, no cyanosis or edema Wound: clean and dry, surgical incision well healed   Discharge disposition: 01-Home or Self Care  Discharge Medications:  Allergies as of 05/25/2018      Reactions   Influenza Vaccines Shortness Of Breath   Couldn't breathe   Penicillins Hives, Other (See Comments)   DID THE REACTION INVOLVE: Swelling of the face/tongue/throat, SOB, or low BP? Unknown Sudden or severe rash/hives, skin peeling, or the inside of the mouth or nose? Unknown Did it require medical treatment? #  #  #  YES  #  #  #  When did it last happen? Teenager If all above answers are "NO", may proceed with cephalosporin use.   Proton Pump Inhibitors Anaphylaxis   Sulfites Anaphylaxis, Swelling, Other (See Comments)   Turn red and throat swells   Wasp Venom Protein Swelling   Aspartame Other (See Comments)   Severe diarrhea   Avocado Other (See Comments)   Chest tightness, nauseous   Cefaclor Hives   Chlorhexidine Other (See Comments)   Dizziness, issues with use on skin as burns her skin up   Chloroxine Other (See Comments)   Severe migraine, chemical cleaning products   Chocolate Other (See Comments)   Acne   Ciprofloxacin Hcl Hives   Clarithromycin Hives   Fentanyl    Red flush   Gatifloxacin Hives   Ivp Dye [iodinated Diagnostic Agents] Other (See Comments)   Acne   Ketorolac Other (See Comments)   Extreme dizziness and fatigue   Lactase Other (See Comments)   Unknown   Molds & Smuts Other (See Comments)   Unknown   Nexium [esomeprazole Magnesium] Other (See Comments)   Throat spasms   Other Other (See Comments)   Grass and Trees, Maple, [place], [place], [place], [place], [place], Rag weed, [place], Pollen, Dog and cats, Technical sales engineer and Down. ALL ANTIBIOTICS CAUSE HIVES EXCEPT Z-PAKS   Pork-derived Products Other (See Comments)   religious    Prednisolone Other (See Comments)   Extreme dizziness and fatigue   Shellfish Allergy Other (See Comments)   Acne   Soap Other (See Comments)   Soft soap- severe dermatitis   Soft Soap  Brand name   Sulfa Antibiotics Hives   Sulfamethoxazole-trimethoprim Hives   Zantac [ranitidine Hcl] Other (See Comments)   Severe throat spasms   Oxycodone Hives      Medication List    TAKE these medications   acetaminophen 160 MG/5ML solution Commonly known as:  TYLENOL Take 31.3 mLs (1,000 mg total) by mouth every 6 (six) hours as needed for moderate pain.   beta carotene 25000 UNIT capsule Take 25,000 Units by mouth every other day.   CoQ10 200 MG Caps Take 200 mg by mouth daily.   EMERGEN-C IMMUNE PO Take 1 packet by mouth daily as needed (for immune system).   EPIPEN 2-PAK 0.3 mg/0.3 mL Soaj injection Generic drug:  EPINEPHrine Inject 0.3 mg into the muscle once.   METHYLFOLATE PO Place 800 mcg under the tongue every other day.   Omega-3 1000 MG Caps Take 2,000 mg by mouth daily.   PATANASE  0.6 % Soln Generic drug:  Olopatadine HCl Place 1-2 sprays into the nose 2 (two) times daily as needed (for congestion).   PROBIOTIC PO Take 1 capsule by mouth daily.   Riboflavin 100 MG Caps Take 200 mg by mouth every other day.   rizatriptan 10 MG tablet Commonly known as:  MAXALT Take 5 mg by mouth as needed for migraine. May repeat in 2 hours if needed   rosuvastatin 5 MG tablet Commonly known as:  CRESTOR Take 5 mg by mouth daily.   sucralfate 1 g tablet Commonly known as:  CARAFATE Take 1 g by mouth See admin instructions. Crush and drink 1 g by mouth 2-3 times daily   traMADol 50 MG tablet Commonly known as:  ULTRAM Take 1 tablet (50 mg total) by mouth every 6 (six) hours as needed for severe pain.   vitamin A 8000 UNIT capsule Take 8,000 Units by mouth every other day.   vitamin B-12 1000 MCG tablet Commonly known as:  CYANOCOBALAMIN Take 1,000 mcg by mouth every  other day.   Vitamin D3 50 MCG (2000 UT) Tabs Take 4,000 Units by mouth daily.      Follow-up Information    [name], MD Follow up on 05/31/2018.   Specialty:  Oncology Why:  Appointment is at 2:00 pm, please get CXR 30 min prior to appointment Contact information: [place] [address] [phone]           Signed: Ellwood Handler 05/25/2018, 8:01 AM

## 2018-05-24 NOTE — Progress Notes (Addendum)
      [address]       [place] [address]             [phone]      Subjective:   No specific complaints.  States had an okay night.  Tolerated Vancomycin with minimal itching.  Objective: Vital signs in last 24 hours: Temp:  [98.2 F (36.8 C)-98.6 F (37 C)] 98.2 F (36.8 C) (02/06 0500) Pulse Rate:  [88-106] 88 (02/06 0500) Cardiac Rhythm: Normal sinus rhythm (02/05 2100) Resp:  [14-23] 23 (02/06 0500) BP: (131-147)/(74-96) 131/74 (02/06 0500) SpO2:  [97 %] 97 % (02/06 0500) Weight:  [64 kg] 64 kg (02/05 2013)  General appearance: alert, cooperative and no distress Heart: regular rate and rhythm Lungs: diminished left base Abdomen: soft, non-tender; bowel sounds normal; no masses,  no organomegaly Extremities: extremities normal, atraumatic, no cyanosis or edema Wound: clean and dry  Lab Results: No results for input(s): WBC, HGB, HCT, PLT in the last 72 hours. BMET:  Recent Labs    05/24/18 0626  NA 139  K 3.9  CL 106  CO2 26  GLUCOSE 110*  BUN 12  CREATININE 0.74  CALCIUM 8.5*    PT/INR: No results for input(s): LABPROT, INR in the last 72 hours. ABG    Component Value Date/Time   PHART 7.490 (H) 04/26/2018 0535   HCO3 23.9 04/26/2018 0535   ACIDBASEDEF 0.4 04/24/2018 1306   O2SAT 97.7 04/26/2018 0535   CBG (last 3)  No results for input(s): GLUCAP in the last 72 hours.  Assessment/Plan:  1. Left Pleural Effusion/Atelectasis- CT scan shows no evidence of empyema-- will order flutter valve, may need Thoracentesis vs. Chest tube placement 2.CV- remains Tachycardic and Hypertensive- would benefit from low dose BB, however patient will be reluctant to start this medication, she will need close follow up from her PCP 3. Arixtra for DVT/PE prophylaxis 4. ID-afebrile, no leukocytosis on outpatient CBC- on Vancomycin will continue for now 5. Dispo- patient stable, CT scan shows no evidence of empyema, + pleural effusion/atelectasis-  may benefit from Thoracentesis vs. Chest tube placement, monitor HR/BP, Arixtra for DVT/PE Prophylaxis   LOS: 1 day    Ellwood Handler 05/24/2018 Patient seen and examined, agree with above Reviewed CT images. Appears to be a simple pleural effusion rather than pneumonia. No air fluid level or air bubbles in the fluid to suggest BPF or empyema I think the best option is to do a thoracentesis to drain as much fluid as possible. Will test for triglycerides, protein, glucose and LDH. Will also do GS and culture. D/w Dr. [name] of Radiology  Revonda Standard. Roxan Hockey, MD [facility] [phone]

## 2018-05-24 NOTE — Plan of Care (Signed)
?  Problem: Clinical Measurements: ?Goal: Will remain free from infection ?Outcome: Progressing ?Goal: Diagnostic test results will improve ?Outcome: Progressing ?Goal: Respiratory complications will improve ?Outcome: Progressing ?  ?

## 2018-05-25 ENCOUNTER — Encounter (HOSPITAL_COMMUNITY): Payer: Self-pay | Admitting: Radiology

## 2018-05-25 ENCOUNTER — Encounter: Payer: Self-pay | Admitting: *Deleted

## 2018-05-25 ENCOUNTER — Inpatient Hospital Stay (HOSPITAL_COMMUNITY): Payer: Medicare Other

## 2018-05-25 ENCOUNTER — Other Ambulatory Visit: Payer: Self-pay | Admitting: *Deleted

## 2018-05-25 DIAGNOSIS — J9382 Other air leak: Secondary | ICD-10-CM

## 2018-05-25 LAB — CBC
HCT: 36.2 % (ref 36.0–46.0)
Hemoglobin: 11.7 g/dL — ABNORMAL LOW (ref 12.0–15.0)
MCH: 30.8 pg (ref 26.0–34.0)
MCHC: 32.3 g/dL (ref 30.0–36.0)
MCV: 95.3 fL (ref 80.0–100.0)
Platelets: 255 10*3/uL (ref 150–400)
RBC: 3.8 MIL/uL — ABNORMAL LOW (ref 3.87–5.11)
RDW: 12.9 % (ref 11.5–15.5)
WBC: 8 10*3/uL (ref 4.0–10.5)
nRBC: 0 % (ref 0.0–0.2)

## 2018-05-25 LAB — BASIC METABOLIC PANEL
Anion gap: 16 — ABNORMAL HIGH (ref 5–15)
BUN: 13 mg/dL (ref 8–23)
CO2: 19 mmol/L — ABNORMAL LOW (ref 22–32)
Calcium: 8.7 mg/dL — ABNORMAL LOW (ref 8.9–10.3)
Chloride: 100 mmol/L (ref 98–111)
Creatinine, Ser: 0.74 mg/dL (ref 0.44–1.00)
GFR calc Af Amer: >60 mL/min (ref >60)
GFR calc non Af Amer: >60 mL/min (ref >60)
Glucose, Bld: 67 mg/dL — ABNORMAL LOW (ref 70–99)
Potassium: 3.8 mmol/L (ref 3.5–5.1)
Sodium: 135 mmol/L (ref 135–145)

## 2018-05-25 MED ORDER — ACETAMINOPHEN 160 MG/5ML PO SOLN: 1000.0000 mg | Freq: Four times a day (QID) | ORAL | 0 refills | Status: DC | PRN

## 2018-05-25 NOTE — Plan of Care (Signed)
?  Problem: Clinical Measurements: ?Goal: Will remain free from infection ?Outcome: Progressing ?Goal: Diagnostic test results will improve ?Outcome: Progressing ?Goal: Respiratory complications will improve ?Outcome: Progressing ?  ?

## 2018-05-25 NOTE — Discharge Instructions (Signed)
Pleural Effusion Pleural effusion is an abnormal buildup of fluid in the layers of tissue between the lungs and the inside of the chest (pleural space) The two layers of tissue that line the lungs and the inside of the chest are called pleura. Usually, there is no air in the space between the pleura, only a thin layer of fluid. Some conditions can cause a large amount of fluid to build up, which can cause the lung to collapse if untreated. A pleural effusion is usually caused by another disease that requires treatment. What are the causes? Pleural effusion can be caused by:  Heart failure.  Certain infections, such as pneumonia or tuberculosis.  Cancer.  A blood clot in the lung (pulmonary embolism).  Complications from surgery, such as from open heart surgery.  Liver disease (cirrhosis).  Kidney disease. What are the signs or symptoms? In some cases, pleural effusion may cause no symptoms. If symptoms are present, they may include:  Shortness of breath, especially when lying down.  Chest pain. This may get worse when taking a deep breath.  Fever.  Dry, long-lasting (chronic) cough.  Hiccups.  Rapid breathing. An underlying condition that is causing the pleural effusion (such as heart failure, pneumonia, blood clots, tuberculosis, or cancer) may also cause other symptoms. How is this diagnosed? This condition may be diagnosed based on:  Your symptoms and medical history.  A physical exam.  A chest X-ray.  A procedure to use a needle to remove fluid from the pleural space (thoracentesis). This fluid is tested.  Other imaging studies of the chest, such as ultrasound or CT scan. How is this treated? Depending on the cause of your condition, treatment may include:  Treating the underlying condition that is causing the effusion. When that condition improves, the effusion will also improve. Examples of treatment for underlying conditions include: ? Antibiotic medicines to  treat an infection. ? Diuretics or other heart medicines to treat heart failure.  Thoracentesis.  Placing a thin flexible tube under your skin and into your chest to continuously drain the effusion (indwelling pleural catheter).  Surgery to remove the outer layer of tissue from the pleural space (decortication).  A procedure to put medicine into the chest cavity to seal the pleural space and prevent fluid buildup (pleurodesis).  Chemotherapy and radiation therapy, if you have cancerous (malignant) pleural effusion. These treatments are typically used to treat cancer. They kill certain cells in the body. Follow these instructions at home:  Take over-the-counter and prescription medicines only as told by your health care provider.  Ask your health care provider what activities are safe for you.  Keep track of how long you are able to do mild exercise (such as walking) before you get short of breath. Write down this information to share with your health care provider. Your ability to exercise should improve over time.  Do not use any products that contain nicotine or tobacco, such as cigarettes and e-cigarettes. If you need help quitting, ask your health care provider.  Keep all follow-up visits as told by your health care provider. This is important. Contact a health care provider if:  The amount of time that you are able to do mild exercise: ? Decreases. ? Does not improve with time.  You have a fever. Get help right away if:  You are short of breath.  You develop chest pain.  You develop a new cough. Summary  Pleural effusion is an abnormal buildup of fluid in the layers  of tissue between the lungs and the inside of the chest.  Pleural effusion can have many causes, including heart failure, pulmonary embolism, infections, or cancer.  Symptoms of pleural effusion can include shortness of breath, chest pain, fever, long-lasting (chronic) cough, hiccups, or rapid  breathing.  Diagnosis often involves making images of the chest (such as with ultrasound or X-ray) and removing fluid (thoracentesis) to send for testing.  Treatment for pleural effusion depends on what underlying condition is causing it. This information is not intended to replace advice given to you by your health care provider. Make sure you discuss any questions you have with your health care provider. Document Released: 04/04/2005 Document Revised: 12/08/2016 Document Reviewed: 12/08/2016 Elsevier Interactive Patient Education  2019 [employer].

## 2018-05-25 NOTE — Care Management Note (Addendum)
Case Management Note  Patient Details  Name: [name] MRN: [id number] Date of Birth: 15-Apr-1951  Subjective/Objective:   Pt admitted with pleural effusions - pt recently discharge post stay requiring VATS                  Action/Plan:    PTA independent from home.  Pt recently set up with [facility] for RN - CM requested resumption orders  Expected Discharge Date:  05/25/18               Expected Discharge Plan:  [place]  In-House Referral:     Discharge planning Services  CM Consult  Post Acute Care Choice:    Choice offered to:  Patient  DME Arranged:    DME Agency:     HH Arranged:  RN, PT HH Agency:  [name]  Status of Service:  In process, will continue to follow  If discussed at Long Length of Stay Meetings, dates discussed:    Additional Comments: 05/25/2018 Pt deemed stable for discharge today.  Resumption of HH orders written, [facility] informed pt will discharge today and confirmed that [facility] will resume. Maryclare Labrador, RN 05/25/2018, 8:34 AM

## 2018-05-25 NOTE — Progress Notes (Addendum)
      [address]       [place] [address]             [phone]      Subjective:  No new complaints.  Doing better.  Does still have some shortness of breath while laying flat.  Objective: Vital signs in last 24 hours: Temp:  [98.3 F (36.8 C)-99 F (37.2 C)] 98.5 F (36.9 C) (02/06 1923) Pulse Rate:  [83-100] 98 (02/06 1923) Cardiac Rhythm: Normal sinus rhythm (02/07 0700) Resp:  [16-27] 24 (02/06 1923) BP: (121-147)/(74-79) 142/79 (02/06 1923) SpO2:  [93 %-98 %] 98 % (02/06 1923)  Intake/Output from previous day: 02/06 0701 - 02/07 0700 In: 480 [P.O.:480] Out: -   General appearance: alert, cooperative and no distress Heart: regular rate and rhythm Lungs: diminished breath sounds bibasilar Abdomen: soft, non-tender; bowel sounds normal; no masses,  no organomegaly Extremities: extremities normal, atraumatic, no cyanosis or edema Wound: clean and dry, surgical incision well healed  Lab Results: Recent Labs    05/25/18 0210  WBC 8.0  HGB 11.7*  HCT 36.2  PLT 255   BMET:  Recent Labs    05/24/18 0626 05/25/18 0210  NA 139 135  K 3.9 3.8  CL 106 100  CO2 26 19*  GLUCOSE 110* 67*  BUN 12 13  CREATININE 0.74 0.74  CALCIUM 8.5* 8.7*    PT/INR: No results for input(s): LABPROT, INR in the last 72 hours. ABG    Component Value Date/Time   PHART 7.490 (H) 04/26/2018 0535   HCO3 23.9 04/26/2018 0535   ACIDBASEDEF 0.4 04/24/2018 1306   O2SAT 97.7 04/26/2018 0535   CBG (last 3)  No results for input(s): GLUCAP in the last 72 hours.  Assessment/Plan:  1. S/P Thoracentesis- pleural effusion improved, some atelectasis persists, no pneuomthorax, 475 ml of fluid removed, however per IR note doesn't appear that fluid was sent for testing 2. CV- Tachycardia improved, BP is mildly elevated in 140s is okay 3. Arixtra for DVT 4. ID- remains afebrile, no leukocytosis WBC count 8 5. Dispo- patient stable, improvement of pleural effusion on  CXR, atelectasis persist, remains afebrile, no leukocytosis present, will d/c if okay with Dr. Roxan Hockey   LOS: 2 days    Ellwood Handler 05/25/2018 Patient seen and examined, agree with above Dc home today F/u next week with me and Oncology  Remo Lipps C. Roxan Hockey, MD [facility] [phone]

## 2018-05-25 NOTE — H&P (View-Only) (Signed)
      [address]       [place] [address]             [phone]      Subjective:  No new complaints.  Doing better.  Does still have some shortness of breath while laying flat.  Objective: Vital signs in last 24 hours: Temp:  [98.3 F (36.8 C)-99 F (37.2 C)] 98.5 F (36.9 C) (02/06 1923) Pulse Rate:  [83-100] 98 (02/06 1923) Cardiac Rhythm: Normal sinus rhythm (02/07 0700) Resp:  [16-27] 24 (02/06 1923) BP: (121-147)/(74-79) 142/79 (02/06 1923) SpO2:  [93 %-98 %] 98 % (02/06 1923)  Intake/Output from previous day: 02/06 0701 - 02/07 0700 In: 480 [P.O.:480] Out: -   General appearance: alert, cooperative and no distress Heart: regular rate and rhythm Lungs: diminished breath sounds bibasilar Abdomen: soft, non-tender; bowel sounds normal; no masses,  no organomegaly Extremities: extremities normal, atraumatic, no cyanosis or edema Wound: clean and dry, surgical incision well healed  Lab Results: Recent Labs    05/25/18 0210  WBC 8.0  HGB 11.7*  HCT 36.2  PLT 255   BMET:  Recent Labs    05/24/18 0626 05/25/18 0210  NA 139 135  K 3.9 3.8  CL 106 100  CO2 26 19*  GLUCOSE 110* 67*  BUN 12 13  CREATININE 0.74 0.74  CALCIUM 8.5* 8.7*    PT/INR: No results for input(s): LABPROT, INR in the last 72 hours. ABG    Component Value Date/Time   PHART 7.490 (H) 04/26/2018 0535   HCO3 23.9 04/26/2018 0535   ACIDBASEDEF 0.4 04/24/2018 1306   O2SAT 97.7 04/26/2018 0535   CBG (last 3)  No results for input(s): GLUCAP in the last 72 hours.  Assessment/Plan:  1. S/P Thoracentesis- pleural effusion improved, some atelectasis persists, no pneuomthorax, 475 ml of fluid removed, however per IR note doesn't appear that fluid was sent for testing 2. CV- Tachycardia improved, BP is mildly elevated in 140s is okay 3. Arixtra for DVT 4. ID- remains afebrile, no leukocytosis WBC count 8 5. Dispo- patient stable, improvement of pleural effusion on  CXR, atelectasis persist, remains afebrile, no leukocytosis present, will d/c if okay with Dr. Roxan Hockey   LOS: 2 days    Ellwood Handler 05/25/2018 Patient seen and examined, agree with above Dc home today F/u next week with me and Oncology  Remo Lipps C. Roxan Hockey, MD [facility] [phone]

## 2018-05-25 NOTE — Plan of Care (Signed)
  Problem: Clinical Measurements: Goal: Will remain free from infection 05/25/2018 0958 by Don Perking, RN Outcome: Completed/Met 05/25/2018 0759 by Don Perking, RN Outcome: Progressing Goal: Diagnostic test results will improve 05/25/2018 0958 by Don Perking, RN Outcome: Completed/Met 05/25/2018 0759 by Don Perking, RN Outcome: Progressing Goal: Respiratory complications will improve 05/25/2018 0958 by Don Perking, RN Outcome: Completed/Met 05/25/2018 [place] by Don Perking, RN Outcome: Progressing

## 2018-05-31 ENCOUNTER — Inpatient Hospital Stay (HOSPITAL_BASED_OUTPATIENT_CLINIC_OR_DEPARTMENT_OTHER): Payer: Medicare Other | Admitting: Internal Medicine

## 2018-05-31 ENCOUNTER — Inpatient Hospital Stay: Payer: Medicare Other | Attending: Internal Medicine

## 2018-05-31 ENCOUNTER — Encounter: Payer: Self-pay | Admitting: *Deleted

## 2018-05-31 ENCOUNTER — Ambulatory Visit (HOSPITAL_COMMUNITY)
Admission: RE | Admit: 2018-05-31 | Discharge: 2018-05-31 | Disposition: A | Discharge status: Home / Self Care | Payer: Medicare Other | Source: Ambulatory Visit | Attending: Thoracic Surgery (Cardiothoracic Vascular Surgery) | Admitting: Thoracic Surgery (Cardiothoracic Vascular Surgery)

## 2018-05-31 ENCOUNTER — Ambulatory Visit (INDEPENDENT_AMBULATORY_CARE_PROVIDER_SITE_OTHER): Payer: Self-pay | Admitting: Thoracic Surgery (Cardiothoracic Vascular Surgery)

## 2018-05-31 VITALS — BP 155/86 | HR 96 | Temp 97.5°F | Resp 18 | Wt 141.4 lb

## 2018-05-31 VITALS — BP 155/86 | HR 96 | Temp 97.5°F | Resp 18 | Ht 64.5 in | Wt 141.4 lb

## 2018-05-31 DIAGNOSIS — D649 Anemia, unspecified: Secondary | ICD-10-CM | POA: Insufficient documentation

## 2018-05-31 DIAGNOSIS — Z79899 Other long term (current) drug therapy: Secondary | ICD-10-CM | POA: Insufficient documentation

## 2018-05-31 DIAGNOSIS — I10 Essential (primary) hypertension: Secondary | ICD-10-CM | POA: Insufficient documentation

## 2018-05-31 DIAGNOSIS — Z902 Acquired absence of lung [part of]: Secondary | ICD-10-CM | POA: Diagnosis not present

## 2018-05-31 DIAGNOSIS — C3490 Malignant neoplasm of unspecified part of unspecified bronchus or lung: Secondary | ICD-10-CM

## 2018-05-31 DIAGNOSIS — C3432 Malignant neoplasm of lower lobe, left bronchus or lung: Secondary | ICD-10-CM | POA: Diagnosis present

## 2018-05-31 DIAGNOSIS — Z5111 Encounter for antineoplastic chemotherapy: Secondary | ICD-10-CM

## 2018-05-31 DIAGNOSIS — C3492 Malignant neoplasm of unspecified part of left bronchus or lung: Secondary | ICD-10-CM | POA: Insufficient documentation

## 2018-05-31 DIAGNOSIS — E785 Hyperlipidemia, unspecified: Secondary | ICD-10-CM | POA: Insufficient documentation

## 2018-05-31 DIAGNOSIS — Z7189 Other specified counseling: Secondary | ICD-10-CM

## 2018-05-31 LAB — CBC WITH DIFFERENTIAL (CANCER CENTER ONLY)
Abs Immature Granulocytes: 0.04 10*3/uL (ref 0.00–0.07)
Basophils Absolute: 0 10*3/uL (ref 0.0–0.1)
Basophils Relative: 0 %
Eosinophils Absolute: 0.2 10*3/uL (ref 0.0–0.5)
Eosinophils Relative: 2 %
HCT: 35.8 % — ABNORMAL LOW (ref 36.0–46.0)
Hemoglobin: 11.3 g/dL — ABNORMAL LOW (ref 12.0–15.0)
Immature Granulocytes: 1 %
Lymphocytes Relative: 27 %
Lymphs Abs: 2.3 10*3/uL (ref 0.7–4.0)
MCH: 30.6 pg (ref 26.0–34.0)
MCHC: 31.6 g/dL (ref 30.0–36.0)
MCV: 97 fL (ref 80.0–100.0)
Monocytes Absolute: 0.7 10*3/uL (ref 0.1–1.0)
Monocytes Relative: 9 %
Neutro Abs: 5.3 10*3/uL (ref 1.7–7.7)
Neutrophils Relative %: 61 %
Platelet Count: 401 10*3/uL — ABNORMAL HIGH (ref 150–400)
RBC: 3.69 MIL/uL — ABNORMAL LOW (ref 3.87–5.11)
RDW: 12.5 % (ref 11.5–15.5)
WBC Count: 8.6 10*3/uL (ref 4.0–10.5)
nRBC: 0 % (ref 0.0–0.2)

## 2018-05-31 LAB — CMP (CANCER CENTER ONLY)
ALT: 15 U/L (ref 0–44)
AST: 19 U/L (ref 15–41)
Albumin: 3.7 g/dL (ref 3.5–5.0)
Alkaline Phosphatase: 76 U/L (ref 38–126)
Anion gap: 9 (ref 5–15)
BUN: 14 mg/dL (ref 8–23)
CO2: 27 mmol/L (ref 22–32)
Calcium: 9.2 mg/dL (ref 8.9–10.3)
Chloride: 103 mmol/L (ref 98–111)
Creatinine: 0.78 mg/dL (ref 0.44–1.00)
GFR, Est AFR Am: >60 mL/min (ref >60)
GFR, Estimated: >60 mL/min (ref >60)
Glucose, Bld: 131 mg/dL — ABNORMAL HIGH (ref 70–99)
Potassium: 3.9 mmol/L (ref 3.5–5.1)
Sodium: 139 mmol/L (ref 135–145)
Total Bilirubin: 0.3 mg/dL (ref 0.3–1.2)
Total Protein: 6.6 g/dL (ref 6.5–8.1)

## 2018-05-31 NOTE — Progress Notes (Signed)
[place] Clinical Social Work  Clinical Social Work met with patient/family at [employer] appointment to offer support and assess for psychosocial needs.  Patient was accompanied by her spouse of 53 years.  She lives in [place], [place] and is a retired Transport planner.  [place] explored patient's concerns, sources of strength, and family/friends supports.  ONCBCN DISTRESS SCREENING 05/31/2018  Screening Type Change in Status  Distress experienced in past week (1-10) 7  Physical Problem type Sleep/insomnia;Mouth sores/swallowing;Changes in urination  Physician notified of physical symptoms Yes  Referral to clinical social work Yes    Clinical Social Work briefly discussed Clinical Social Work role and [employer] support programs/services.  Clinical Social Work encouraged patient to call with any additional questions or concerns.   Maryjean Morn, MSW, LCSW, OSW-C Clinical Social Worker [facility] [phone]

## 2018-05-31 NOTE — Progress Notes (Signed)
Oncology Nurse Navigator Documentation  Oncology Nurse Navigator Flowsheets 05/31/2018  Navigator Location [facility]  Navigator Encounter Type Clinic/MDC/I spoke with patient and husband today at thoracic clinic after Dr. [name].  Patient is being evaluated for clinical trial.  Once clinical trial eligibility is decided then treatment will be decided.  I gave and explained information on lung cancer and side effects of treatment.   Abnormal Finding Date 03/01/2018  Confirmed Diagnosis Date 03/07/2018  Surgery Date 04/25/2018  Multidisiplinary Clinic Date 05/31/2018  Treatment Initiated Date 04/25/2018  Patient Visit Type MedOnc  Treatment Phase Other  Barriers/Navigation Needs Education  Education Newly Diagnosed Cancer Education  Interventions Education  Education Method Verbal;Written  Acuity Level 2  Time Spent with Patient 30

## 2018-05-31 NOTE — Research (Signed)
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 This study will not have a treatment plan as it is only to test for genetic markers. Dr. [name] referred patient for the above study.  Met with patient and her husband for 45 minutes in private exam room. Patient stated Dr. [name] explained this study and she would like to enroll.  Presented patient with copy of consent and hippa forms for [facility] 02/02/18, [place] Active Date 03/06/18.  Reviewed these forms with patient in their entirety including the purpose of the study, alternatives, potential risks, and potential benefits. Informed patient that participation is voluntary.  Patient denied any questions and wanted to sign consent today.  Patient signed and dated both forms. Discussed next steps with patient, explaining research nurse will confirm patient's eligibility for the study and then request her tissue from Pathology department to submit to study.  Informed patient it can take up to 2 weeks for the study to send results once tissue is submitted.  Patient understands she may or may not be eligible for treatment sub-study depending on the results of this testing.  This research nurse will let patient know if she is not eligible for this main study and when tissue is sent if she is eligible. Patient verbalized understanding.  She denied any questions at this time.  Gave patient a copy of her signed consent/hippa forms along with an informational brochure about clinical trials along with my business card.  Encouraged patient to call or email if she has any questions or concerns prior to my phone call.  Thanked patient for her time and willingness to participate in this research.  [name], BSN, RN Clinical Research Nurse 05/31/2018 3:30 PM

## 2018-05-31 NOTE — Progress Notes (Signed)
[place] [phone]   [phone] Multidisciplinary thoracic oncology clinic  CONSULT NOTE  REFERRING PHYSICIAN: Dr. Modesto Charon  REASON FOR CONSULTATION:  68 years old white female recently diagnosed with lung cancer.  HPI [name] is a 68 y.o. female with past medical history significant for GERD, dyslipidemia and anemia.  The patient is a never smoker.  She has been complaining of cough with occasional blood-tinged sputum since September 2019.  She was evaluated initially by ENT and no concerning abnormalities were seen on their exam.  She was referred to have imaging studies of her chest.  She had CT scan of the chest without contrast on February 23, 2018 at [facility] imaging in [place].  The scan showed a mass like central opacity at the left suprahilar region adjacent to the descending aorta at the superior medial aspect of the left lower lobe measuring 3.1 x 2.8 x 2.7 cm.  The patient was referred to Dr. Lamonte Sakai.  Repeat CT so body of the chest on March 01, 2018 showed 2.4 x 2.3 cm airspace opacity in the left upper lobe with a possible trans-fissural component in the left lower lobe.  There was no mediastinal or hilar mass or adenopathy.  There was no other significant pulmonary findings.  On March 07, 2018, the patient underwent flexible video fiberoptic bronchoscopy with electromagnetic navigation and biopsies under the care of Dr. Lamonte Sakai.  The final pathology ([id number]) was consistent with adenocarcinoma.  A PET scan on March 21, 2018 showed 2.5 cm transfacial left upper lobe/left lower lobe lesion hypermetabolic and consistent with neoplasm.  There was no PET CT findings for mediastinal/hilar adenopathy or metastatic disease elsewhere.  MRI of the brain on March 24, 2018 showed no evidence of metastatic disease to the brain. The patient was referred to Dr. Roxan Hockey and on April 25, 2018 she  underwent left VATS with left lower lobectomy and mediastinal lymph node dissection.  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  The resection margins were negative and the dissected lymph nodes were negative.  There was no evidence for lymphovascular invasion.  Her surgery was complicated with air leak and the patient has valve placed for management of this condition.  This is expected to be removed in the next 2 weeks. Dr. Roxan Hockey kindly referred the patient to me today for evaluation and recommendation regarding adjuvant therapy.  When seen today the patient is feeling fine with no concerning complaints except for insomnia as well as mild left sided pain and occasional throat spasm after the surgery.  She has shortness of breath with exertion and mild cough with no hemoptysis.  She lost a total of 8 pounds in the last few months.  She denied having any headache or visual changes. Family history significant for father and sister with lung cancer but both of them were smoker.  Mother had dementia and hypertension. The patient is married and has 2 children and 4 grandchildren.  She was accompanied today by her husband [name].  She is a retired Holiday representative.  She has no history for smoking and drinks alcohol 3-4 times a week with no history of drug abuse.  HPI  Past Medical History:  Diagnosis Date  . Anemia   . Atherosclerosis   . GERD (gastroesophageal reflux disease)   . Hemoptysis   . Hyperlipidemia   . Hypertension   . IBS (irritable bowel  syndrome)   . Lung mass   . Migraines   . Spondylosis   . Vitamin D deficiency     Past Surgical History:  Procedure Laterality Date  . BROW LIFT AND BLEPHAROPLASTY  03/15/2013  . CATARACT EXTRACTION, BILATERAL  06/23/2014  . CHEST TUBE INSERTION Left 05/10/2018   Procedure: CHEST TUBE INSERTION;  Surgeon: [name], MD;  Location: [facility];  Service:  Thoracic;  Laterality: Left;  . COLONOSCOPY  05/2014  . ESOPHAGOGASTRODUODENOSCOPY  2019  . EYE SURGERY    . IR THORACENTESIS ASP PLEURAL SPACE W/IMG GUIDE  05/24/2018  . TONSILLECTOMY    . VIDEO ASSISTED THORACOSCOPY (VATS)/ LOBECTOMY Left 04/25/2018   Procedure: VIDEO ASSISTED THORACOSCOPY (VATS)LEFT LOWER LOBECTOMY, NODE DISSECTION;  Surgeon: [name], MD;  Location: [facility];  Service: Thoracic;  Laterality: Left;  Marland Kitchen VIDEO BRONCHOSCOPY WITH ENDOBRONCHIAL NAVIGATION N/A 03/07/2018   Procedure: VIDEO BRONCHOSCOPY WITH ENDOBRONCHIAL NAVIGATION;  Surgeon: Collene Gobble, MD;  Location: [facility] OR;  Service: Thoracic;  Laterality: N/A;  . VIDEO BRONCHOSCOPY WITH INSERTION OF INTERBRONCHIAL VALVE (IBV) N/A 05/10/2018   Procedure: VIDEO BRONCHOSCOPY WITH INSERTION OF INTERBRONCHIAL VALVE (IBV);  Surgeon: [name], MD;  Location: [facility] OR;  Service: Thoracic;  Laterality: N/A;    Family History  Problem Relation Age of Onset  . Dementia Mother   . Hyperlipidemia Mother   . Hypertension Mother   . Thyroid disease Mother   . Irritable bowel syndrome Mother   . Aortic stenosis Mother   . Cancer Father   . Lung cancer Sister     Social History Social History   Tobacco Use  . Smoking status: Never Smoker  . Smokeless tobacco: Never Used  Substance Use Topics  . Alcohol use: Yes    Alcohol/week: 4.0 standard drinks    Types: 4 Glasses of wine per week    Comment: a week  . Drug use: Never    Allergies  Allergen Reactions  . Influenza Vaccines Shortness Of Breath    Couldn't breathe  . Penicillins Hives and Other (See Comments)    DID THE REACTION INVOLVE: Swelling of the face/tongue/throat, SOB, or low BP? Unknown Sudden or severe rash/hives, skin peeling, or the inside of the mouth or nose? Unknown Did it require medical treatment? #  #  #  YES  #  #  #  When did it last happen? Teenager If all above answers are "NO", may proceed with cephalosporin use.   . Proton  Pump Inhibitors Anaphylaxis  . Sulfites Anaphylaxis, Swelling and Other (See Comments)    Turn red and throat swells  . Wasp Venom Protein Swelling  . Aspartame Other (See Comments)    Severe diarrhea  . Avocado Other (See Comments)    Chest tightness, nauseous  . Cefaclor Hives  . Chlorhexidine Other (See Comments)    Dizziness, issues with use on skin as burns her skin up  . Chloroxine Other (See Comments)    Severe migraine, chemical cleaning products  . Chocolate Other (See Comments)    Acne  . Ciprofloxacin Hcl Hives  . Clarithromycin Hives  . Fentanyl     Red flush  . Gatifloxacin Hives  . Ivp Dye [Iodinated Diagnostic Agents] Other (See Comments)    Acne  . Ketorolac Other (See Comments)    Extreme dizziness and fatigue  . Lactase Other (See Comments)    Unknown  . Molds & Smuts Other (See Comments)    Unknown  .  Nexium [Esomeprazole Magnesium] Other (See Comments)    Throat spasms  . Other Other (See Comments)    Grass and Trees, Maple, [place], [place], [place] mix, [place], [place], Rag weed, [place], Pollen, Dog and cats, Technical sales engineer and Down. ALL ANTIBIOTICS CAUSE HIVES EXCEPT Z-PAKS  . Pork-Derived Products Other (See Comments)    religious  . Prednisolone Other (See Comments)    Extreme dizziness and fatigue  . Shellfish Allergy Other (See Comments)    Acne  . Soap Other (See Comments)    Soft soap- severe dermatitis   Soft Soap  Brand name  . Sulfa Antibiotics Hives  . Sulfamethoxazole-Trimethoprim Hives  . Zantac [Ranitidine Hcl] Other (See Comments)    Severe throat spasms  . Oxycodone Hives    Current Outpatient Medications  Medication Sig Dispense Refill  . acetaminophen (TYLENOL) 160 MG/5ML solution Take 31.3 mLs (1,000 mg total) by mouth every 6 (six) hours as needed for moderate pain. 120 mL 0  . beta carotene 25000 UNIT capsule Take 25,000 Units by mouth every other day.    . Cholecalciferol (VITAMIN D3) 50 MCG (2000 UT) TABS Take 4,000 Units by mouth daily.     . Coenzyme Q10 (COQ10) 200 MG CAPS Take 200 mg by mouth daily.    Marland Kitchen EPINEPHrine (EPIPEN 2-PAK) 0.3 mg/0.3 mL IJ SOAJ injection Inject 0.3 mg into the muscle once.    . Levomefolate Glucosamine (METHYLFOLATE PO) Place 800 mcg under the tongue every other day.    . Multiple Vitamins-Minerals (EMERGEN-C IMMUNE PO) Take 1 packet by mouth daily as needed (for immune system).    . Olopatadine HCl (PATANASE) 0.6 % SOLN Place 1-2 sprays into the nose 2 (two) times daily as needed (for congestion).     . Omega-3 1000 MG CAPS Take 2,000 mg by mouth daily.     . Probiotic Product (PROBIOTIC PO) Take 1 capsule by mouth daily.    . Riboflavin 100 MG CAPS Take 200 mg by mouth every other day.     . rizatriptan (MAXALT) 10 MG tablet Take 5 mg by mouth as needed for migraine. May repeat in 2 hours if needed     . rosuvastatin (CRESTOR) 5 MG tablet Take 5 mg by mouth daily.    . sucralfate (CARAFATE) 1 g tablet Take 1 g by mouth See admin instructions. Crush and drink 1 g by mouth 2-3 times daily    . traMADol (ULTRAM) 50 MG tablet Take 1 tablet (50 mg total) by mouth every 6 (six) hours as needed for severe pain. 28 tablet 0  . vitamin A 8000 UNIT capsule Take 8,000 Units by mouth every other day.    . vitamin B-12 (CYANOCOBALAMIN) 1000 MCG tablet Take 1,000 mcg by mouth every other day.     No current facility-administered medications for this visit.     Review of Systems  Constitutional: positive for fatigue and weight loss Eyes: negative Ears, nose, mouth, throat, and face: negative Respiratory: positive for cough, dyspnea on exertion and pleurisy/chest pain Cardiovascular: negative Gastrointestinal: negative Genitourinary:negative Integument/breast: negative Hematologic/lymphatic: negative Musculoskeletal:negative Neurological: negative Behavioral/Psych: positive for sleep disturbance Endocrine: negative Allergic/Immunologic: negative  Physical Exam  EQA:STMHD, healthy, no distress, well  nourished, well developed and anxious SKIN: skin color, texture, turgor are normal, no rashes or significant lesions HEAD: Normocephalic, No masses, lesions, tenderness or abnormalities EYES: normal, PERRLA, Conjunctiva are pink and non-injected EARS: External ears normal, Canals clear OROPHARYNX:no exudate, no erythema and lips, buccal mucosa, and tongue normal  NECK: supple, no adenopathy, no JVD LYMPH:  no palpable lymphadenopathy, no hepatosplenomegaly BREAST:not examined LUNGS: clear to auscultation , and palpation HEART: regular rate & rhythm, no murmurs and no gallops ABDOMEN:abdomen soft, non-tender and normal bowel sounds BACK: Back symmetric, no curvature., No CVA tenderness EXTREMITIES:no joint deformities, effusion, or inflammation, no edema  NEURO: alert & oriented x 3 with fluent speech, no focal motor/sensory deficits  PERFORMANCE STATUS: ECOG 1  LABORATORY DATA: Lab Results  Component Value Date   WBC 8.6 05/31/2018   HGB 11.3 (L) 05/31/2018   HCT 35.8 (L) 05/31/2018   MCV 97.0 05/31/2018   PLT 401 (H) 05/31/2018      Chemistry      Component Value Date/Time   NA 135 05/25/2018 0210   K 3.8 05/25/2018 0210   CL 100 05/25/2018 0210   CO2 19 (L) 05/25/2018 0210   BUN 13 05/25/2018 0210   CREATININE 0.74 05/25/2018 0210      Component Value Date/Time   CALCIUM 8.7 (L) 05/25/2018 0210   ALKPHOS 40 04/27/2018 0525   AST 24 04/27/2018 0525   ALT 24 04/27/2018 0525   BILITOT 0.8 04/27/2018 0525       RADIOGRAPHIC STUDIES: Dg Chest 1 View  Result Date: 05/24/2018 CLINICAL DATA:  Pleural effusion. S/P thoracentesis on left side. EXAM: CHEST  1 VIEW COMPARISON:  To 09/05/2018, 05/16/2018 FINDINGS: There is postoperative volume loss on the LEFT. Persistent subcutaneous gas is identified in the LEFT chest wall. The LEFT pneumothorax is no longer apparent. The RIGHT lung remains clear. IMPRESSION: No pneumothorax. Postoperative changes in the LEFT lung.  Electronically Signed   By: Nolon Nations M.D.   On: 05/24/2018 10:57   Dg Chest 2 View  Result Date: 05/25/2018 CLINICAL DATA:  Status post thoracotomy. Placement of intrabronchial valves. EXAM: CHEST - 2 VIEW COMPARISON:  Chest x-ray 05/24/2018 FINDINGS: Stable surgical changes involving the left hemithorax with significant loss of volume. Endobronchial valves are noted near the left hilum. Persistent left basilar density likely a combination of residual pleural fluid and atelectasis. Stable subcutaneous emphysema but no definite pneumothorax. The right lung is clear.  Stable hyperexpansion. IMPRESSION: 1. Persistent left basilar process, likely combination of residual fluid and atelectasis. 2. Stable subcutaneous emphysema but no definite pneumothorax. 3. Endobronchial valves are again noted in the left hilum. Electronically Signed   By: Marijo Sanes M.D.   On: 05/25/2018 08:23   Dg Chest 2 View  Result Date: 05/16/2018 CLINICAL DATA:  Recent left lobectomy, follow-up pneumothorax EXAM: CHEST - 2 VIEW COMPARISON:  05/15/2018 FINDINGS: Cardiac shadow is stable. Endobronchial valves are again noted on the left. Volume loss on the left is seen but stable. The previously noted left pneumothorax is again seen but slightly decreased when compared with the prior exam. Right lung remains clear. No acute bony abnormality is seen. IMPRESSION: Slight decrease in left-sided pneumothorax apically. The remainder of the exam is stable. Electronically Signed   By: Inez Catalina M.D.   On: 05/16/2018 07:44   Dg Chest 2 View  Result Date: 05/10/2018 CLINICAL DATA:  Status post left lobectomy EXAM: CHEST - 2 VIEW COMPARISON:  05/09/2018 FINDINGS: Cardiac shadow is stable. Aortic calcifications are again seen. The right lung is clear. Left lung appears well aerated. No definitive infiltrate is seen. Considerable subcutaneous emphysema is seen. IMPRESSION: Considerable subcutaneous emphysema. Previously seen  pneumothorax on the left is not well appreciated on today's exam. Electronically Signed   By: Inez Catalina  M.D.   On: 05/10/2018 08:35   Ct Chest Wo Contrast  Result Date: 05/23/2018 CLINICAL DATA:  Persistent air leak with extensive subcutaneous emphysema. EXAM: CT CHEST WITHOUT CONTRAST TECHNIQUE: Multidetector CT imaging of the chest was performed following the standard protocol without IV contrast. COMPARISON:  03/01/2018 FINDINGS: Cardiovascular: Evaluation of vascular structures is limited without IV contrast material. Heart size is normal. Small pericardial effusions. Normal caliber thoracic aorta. Scattered aortic and coronary artery calcifications. Mediastinum/Nodes: Esophagus is decompressed. Small amount of gas in the left anterior mediastinal fat. No significant lymphadenopathy. Lungs/Pleura: Postoperative partial left lung resection. Moderate-sized left pleural effusion with basilar atelectasis. Opacification of left lower lobe bronchi may represent secretions. Right lung is clear and expanded. No pneumothorax. Upper Abdomen: No acute abnormalities identified. Musculoskeletal: There is prominent subcutaneous emphysema in the left chest wall anteriorly with extension to the left axilla and left shoulder as well as the left flank. Bones appear intact. Normal alignment of the thoracic spine. No acute displaced rib fractures identified. IMPRESSION: 1. Postoperative partial left lung resection. Moderate-sized left pleural effusion with basilar atelectasis. Opacification of left lower lobe bronchi may represent secretions. 2. Prominent subcutaneous emphysema in the left chest wall, left axilla, and left flank. Small amount of gas in the left anterior mediastinal fat. 3. Small pericardial effusion. Electronically Signed   By: Lucienne Capers M.D.   On: 05/23/2018 23:06   Dg Chest 1v Repeat Same Day  Result Date: 05/14/2018 CLINICAL DATA:  Follow-up pneumothorax EXAM: CHEST - 1 VIEW SAME DAY COMPARISON:   05/14/2018 FINDINGS: Cardiac shadow is stable. Left chest tube is again noted. Small pneumothorax is noted increased from the prior exam. Endobronchial valves are noted on the left. Right lung is clear. No bony abnormality is noted. Significant subcutaneous emphysema is noted. IMPRESSION: Mild recurrent pneumothorax on the left. These results will be called to the ordering clinician or representative by the Radiologist Assistant, and communication documented in the PACS or zVision Dashboard. Electronically Signed   By: Inez Catalina M.D.   On: 05/14/2018 12:37   Dg Chest 1v Repeat Same Day  Result Date: 05/09/2018 CLINICAL DATA:  Status post chest tube removal EXAM: CHEST - 1 VIEW SAME DAY COMPARISON:  05/09/2018 FINDINGS: Left chest tube has been removed. Similar small residual left pneumothorax with apical and basilar components. There is increased left chest subcutaneous emphysema laterally. Postop changes in the left hilum. Normal heart size and vascularity. Right lung remains clear. Aorta atherosclerotic. IMPRESSION: Interval left chest tube removal. Stable small left pneumothorax with apical and basilar components. Increased left chest subcutaneous emphysema. Electronically Signed   By: Jerilynn Mages.  Shick M.D.   On: 05/09/2018 13:32   Dg Chest Port 1 View  Result Date: 05/15/2018 CLINICAL DATA:  Status post left chest tube removal EXAM: PORTABLE CHEST 1 VIEW COMPARISON:  05/15/2018 FINDINGS: Cardiac shadow is stable. Endobronchial valves are again identified. Left-sided chest tube has been removed. Small pneumothorax is noted apically which has improved in the interval from the prior exam. The right lung remains clear. IMPRESSION: Small residual pneumothorax following chest tube removal although the overall appearance has improved. Electronically Signed   By: Inez Catalina M.D.   On: 05/15/2018 12:30   Dg Chest Port 1 View  Result Date: 05/15/2018 CLINICAL DATA:  Pneumothorax EXAM: PORTABLE CHEST 1 VIEW  COMPARISON:  05/14/2018 FINDINGS: Left chest tube is stable. 10% left apical pneumothorax is stable. There is continued volume loss in left hemithorax with shift of the mediastinum  to the left. Subcutaneous emphysema over the left thorax is unchanged. Right lung is clear. Opacities throughout the left lung are unchanged. IMPRESSION: No significant change. Left chest tube remains in place. Stable left 10% pneumothorax. Electronically Signed   By: Marybelle Killings M.D.   On: 05/15/2018 09:15   Dg Chest Port 1 View  Result Date: 05/14/2018 CLINICAL DATA:  LEFT lobectomy. EXAM: PORTABLE CHEST 1 VIEW COMPARISON:  05/13/2018 FINDINGS: Volume loss in LEFT hemithorax. Chest tube in place in the superolateral LEFT hemithorax. No appreciable pneumothorax. Increased LEFT upper lobe gas/aeration. Atelectasis in the LEFT upper lobe.a Extensive subcutaneous gas along the LEFT chest wall and neck. RIGHT lung clear. IMPRESSION: Increased air in the superior LEFT hemithorax with LEFT upper lobe atelectasis. Chest tube in place. Electronically Signed   By: Suzy Bouchard M.D.   On: 05/14/2018 08:08   Dg Chest Port 1 View  Result Date: 05/13/2018 CLINICAL DATA:  Evaluate for pneumothorax. EXAM: PORTABLE CHEST 1 VIEW COMPARISON:  05/12/18 FINDINGS: There is a left chest tube in place. The left-sided pneumothorax is again noted but appears decreased in volume from previous exam. Extensive left chest wall subcutaneous emphysema appears unchanged. Right lung is clear. IMPRESSION: 1. Decrease in volume of left pneumothorax status post chest tube placement. Electronically Signed   By: Kerby Moors M.D.   On: 05/13/2018 07:23   Dg Chest Port 1 View  Result Date: 05/12/2018 CLINICAL DATA:  S/P lobectomy of lung, check chest tube position EXAM: PORTABLE CHEST 1 VIEW COMPARISON:  Chest x-ray dated 05/11/2018. FINDINGS: LEFT-sided chest tube is stable in position. No LEFT-sided pneumothorax appreciated on today's study, although  possibly obscured by the large amount of overlying subcutaneous emphysema. RIGHT lung remains clear. Heart size and mediastinal contours are grossly stable. IMPRESSION: LEFT-sided chest tube is stable in position. No visible LEFT-sided pneumothorax, although possibly obscured by the overlying subcutaneous emphysema. Electronically Signed   By: Franki Cabot M.D.   On: 05/12/2018 08:46   Dg Chest Port 1 View  Result Date: 05/11/2018 CLINICAL DATA:  Status post extubation EXAM: PORTABLE CHEST 1 VIEW COMPARISON:  May 10, 2018 FINDINGS: Endotracheal tube is been removed. There is currently a chest tube in the periphery of the left upper hemithorax. There is a focal apical pneumothorax without tension component. There is extensive subcutaneous air on the left. Right lung is hyperexpanded but clear. There is moderate volume loss on the left with shift of heart and mediastinum to the left. There is aortic atherosclerosis. IMPRESSION: Chest tube present on the left. Current left apical pneumothorax without tension component. Persistent extensive subcutaneous emphysema. Persistent volume loss on the left with shift of heart and mediastinum toward the left. Right lung hyperexpanded but clear. There is aortic atherosclerosis. Aortic Atherosclerosis (ICD10-I70.0). Electronically Signed   By: Lowella Grip III M.D.   On: 05/11/2018 08:00   Dg Chest Port 1 View  Result Date: 05/10/2018 CLINICAL DATA:  Post median bronchoscopy and insertion of intrabronchial valve EXAM: PORTABLE CHEST 1 VIEW COMPARISON:  Portable exam 1648 hours compared to 05/10/2018 at 0708 hours FINDINGS: Tip of endotracheal tube projects 4.5 cm above carina. Pigtail LEFT thoracostomy tube present. Interbronchial valves noted at LEFT hilum. Normal heart size and mediastinal contours. Volume loss in LEFT hemithorax with hyperinflation of the RIGHT lung. No definite pneumothorax. Extensive chest wall emphysema LEFT chest extending into the  cervical regions bilaterally. IMPRESSION: Volume loss in the LEFT hemithorax. Extensive chest wall emphysema into cervical region bilaterally. Electronically  Signed   By: Lavonia Dana M.D.   On: 05/10/2018 17:10   Dg Chest Port 1 View  Result Date: 05/09/2018 CLINICAL DATA:  Followup left chest wall subcutaneous emphysema. EXAM: PORTABLE CHEST 1 VIEW COMPARISON:  05/09/2018 FINDINGS: Normal heart size. No pleural effusion or edema identified. Stable small left pneumothorax with apical and basilar components. Extensive left chest wall subcutaneous gas is identified which appears slightly increased from previous exam. Right lung is clear. IMPRESSION: 1. Unchanged small left pneumothorax. 2. Mild increase in extensive left chest wall subcutaneous emphysema. Electronically Signed   By: Kerby Moors M.D.   On: 05/09/2018 17:18   Dg Chest Port 1 View  Result Date: 05/09/2018 CLINICAL DATA:  Follow-up chest tube EXAM: PORTABLE CHEST 1 VIEW COMPARISON:  05/08/2018 FINDINGS: Cardiac shadow is stable. Left thoracostomy catheter is again noted. Minimal left apical pneumothorax is again identified and stable. Postsurgical changes on the left are noted. The right lung is clear. No acute bony abnormality is seen. IMPRESSION: Left thoracostomy catheter in place with tiny left apical pneumothorax. No acute abnormality is noted. Electronically Signed   By: Inez Catalina M.D.   On: 05/09/2018 08:11   Dg Chest Port 1 View  Result Date: 05/08/2018 CLINICAL DATA:  Lung surgery.  Chest tube. EXAM: PORTABLE CHEST 1 VIEW COMPARISON:  05/07/2018. FINDINGS: Mediastinum and hilar structures stable. Heart size stable. Prior left upper lobectomy. No acute infiltrates. No pleural effusion. Tiny left apical pneumothorax again noted. No interim change. Left chest tube in stable position. Extensive left chest wall subcutaneous emphysema again noted. IMPRESSION: 1.Left chest tube in stable position. Tiny left apical pneumothorax again  noted. No interim change. Diffuse left chest wall subcutaneous emphysema unchanged. 2. Prior left upper lobectomy. No acute pulmonary disease. Heart size stable. Electronically Signed   By: Marcello Moores  Register   On: 05/08/2018 07:25   Dg Chest Port 1 View  Result Date: 05/07/2018 CLINICAL DATA:  Follow-up examination status post left lobectomy. EXAM: PORTABLE CHEST 1 VIEW COMPARISON:  Prior radiograph from 05/06/2018 FINDINGS: Cardiac and mediastinal silhouettes are stable. Aortic atherosclerosis. Left-sided chest tube remains in place, tip overlying the left upper lobe. There is a persistent small left apical pneumothorax, similar to previous. Probable small left pleural effusion. Atelectatic changes at the left lung base, mildly worsened from previous with decreased aeration. Right lung remains clear. No new infiltrates or overt pulmonary edema. Persistent extensive soft tissue emphysema within the left neck and chest wall. Osseous structures unchanged. IMPRESSION: 1. Persistent small left apical pneumothorax, little interval changed as compared to previous. Left-sided chest tube in stable position. 2. Small left pleural effusion with increased left lower lobe atelectasis. Electronically Signed   By: Jeannine Boga M.D.   On: 05/07/2018 06:12   Dg Chest Port 1 View  Result Date: 05/06/2018 CLINICAL DATA:  Postop lobectomy EXAM: PORTABLE CHEST 1 VIEW COMPARISON:  05/05/2018 FINDINGS: Left chest tube remains in place. Improvement in tiny left apical pneumothorax. Extensive subcutaneous gas in the left chest wall unchanged. Lungs are well aerated and clear. Negative for heart failure. No effusion. IMPRESSION: Interval improvement in tiny left apical pneumothorax. Electronically Signed   By: Franchot Gallo M.D.   On: 05/06/2018 07:39   Dg Chest Port 1 View  Result Date: 05/05/2018 CLINICAL DATA:  Postoperative lobectomy with subcutaneous emphysema EXAM: PORTABLE CHEST 1 VIEW COMPARISON:  May 04, 2018 FINDINGS: Chest tube remains on the left. There is a minimal left apical pneumothorax. There is extensive  subcutaneous air bilaterally, much more on the left than on the right. There is postoperative change on the left with mild volume loss. There is no frank edema or consolidation. Heart size and pulmonary vascularity are normal. There is aortic atherosclerosis. No evident adenopathy. No bone lesions. IMPRESSION: Chest tube remains on the left, unchanged. Minimal left apical pneumothorax. Extensive subcutaneous air, more on the left than on the right. Postoperative change with mild volume loss on the left. Lungs elsewhere clear. Stable cardiac silhouette. There is aortic atherosclerosis. Aortic Atherosclerosis (ICD10-I70.0). Electronically Signed   By: Lowella Grip III M.D.   On: 05/05/2018 07:47   Dg Chest Port 1 View  Result Date: 05/04/2018 CLINICAL DATA:  Follow-up left chest tube EXAM: PORTABLE CHEST 1 VIEW COMPARISON:  05/03/2018 FINDINGS: Cardiac shadows within normal limits. Aortic calcifications are again seen. Postsurgical changes on the left are noted consistent with lobectomy. Left chest tube is noted in place with a considerable amount of subcutaneous emphysema. No definitive pneumothorax is noted at this time. The right lung remains clear. IMPRESSION: Postoperative changes on the left without definitive pneumothorax. No other focal abnormality is noted. Electronically Signed   By: Inez Catalina M.D.   On: 05/04/2018 07:59   Dg Chest Port 1 View  Result Date: 05/03/2018 CLINICAL DATA:  Postop lobectomy EXAM: PORTABLE CHEST 1 VIEW COMPARISON:  05/02/2018 FINDINGS: Left chest tube remains in place. Small left apical pneumothorax unchanged. Extensive subcutaneous emphysema left greater than right appears unchanged. Right lung is clear. Mild left lower lobe atelectasis is unchanged. No significant pleural effusion. IMPRESSION: No significant change.  Small left apical pneumothorax remains.  Electronically Signed   By: Franchot Gallo M.D.   On: 05/03/2018 08:23   Dg Chest Port 1 View  Result Date: 05/02/2018 CLINICAL DATA:  Shortness of breath. EXAM: PORTABLE CHEST 1 VIEW COMPARISON:  Yesterday FINDINGS: Progressive chest wall emphysema that is extensive. There is progressive pneumomediastinum and similar small left apical pneumothorax. Left chest tube in stable position. Lung sutures with volume loss on the left. Normal heart size. IMPRESSION: Progressive chest wall emphysema and pneumomediastinum. Small left apical pneumothorax. Electronically Signed   By: Monte Fantasia M.D.   On: 05/02/2018 07:46   Ir Thoracentesis Asp Pleural Space W/img Guide  Result Date: 05/25/2018 INDICATION: 68 year old female with left-sided pleural fluid EXAM: ULTRASOUND GUIDED LEFT THORACENTESIS MEDICATIONS: None. COMPLICATIONS: None PROCEDURE: An ultrasound guided thoracentesis was thoroughly discussed with the patient and questions answered. The benefits, risks, alternatives and complications were also discussed. The patient understands and wishes to proceed with the procedure. Written consent was obtained. Ultrasound was performed to localize and mark an adequate pocket of fluid in the left chest. The area was then prepped and draped in the normal sterile fashion. 1% Lidocaine was used for local anesthesia. Under ultrasound guidance a 8 Fr Safe-T-Centesis catheter was introduced. Thoracentesis was performed. The catheter was removed and a dressing applied. FINDINGS: A total of approximately 475 cc of hazy dark amber fluid was removed. Samples were sent to the laboratory as requested by the clinical team. IMPRESSION: Status post ultrasound-guided left thoracentesis, performed by VIR PA. Electronically Signed   By: Corrie Mckusick D.O.   On: 05/25/2018 08:14    ASSESSMENT: This is a very pleasant 68 years old a never smoker white female recently diagnosed with stage IIA (T2b, N0, M0) non-small cell lung cancer,  adenocarcinoma diagnosed in November 2019 status post left lower lobectomy with lymph node dissection on April 25, 2018  under the care of Dr. Roxan Hockey.   PLAN: I had a lengthy discussion with the patient and her husband today about her current disease stage, prognosis and treatment options. I explained to the patient the role of adjuvant systemic chemotherapy after surgical resection and the absolute 5-year survival of around 10%.  I discussed with the patient her treatment options including continuous observation and monitoring versus consideration of 4 cycles of systemic chemotherapy with cisplatin 75 mg/M2 and Alimta 500 mg/M2 every 3 weeks for a total of 4 cycles.  I discussed with the patient the adverse effect of the chemotherapy including but not limited to alopecia, myelosuppression, nausea and vomiting, peripheral neuropathy, liver or renal dysfunction as well as some hearing deficit. I also explained to the patient that most of the clinical trial performed in the adjuvant setting would recommend treatment to start within 12 weeks of the surgical resection. I also discussed with the patient consideration of enrollment in the [facility]  [phone] [facility]) trial where the patient will be tested for any molecular abnormalities that could be used as target treatment option with or without the adjuvant systemic chemotherapy. The patient was interested in this trial and she will see the clinical research nurse later today for discussion of the trial and obtaining consent. I will arrange for the patient to come back for follow-up visit after the availability of the molecular studies for more detailed discussion of her treatment option. The patient and her husband had a lot of questions and I answered them completely to their satisfaction. She was advised to call immediately if she has any concerning symptoms in the interval.  The patient voices understanding of current disease status and treatment  options and is in agreement with the current care plan.  All questions were answered. The patient knows to call the clinic with any problems, questions or concerns. We can certainly see the patient much sooner if necessary.  Thank you so much for allowing me to participate in the care of Youa S Awwad. I will continue to follow up the patient with you and assist in her care.  I spent 55 minutes counseling the patient face to face. The total time spent in the appointment was 80 minutes.  Disclaimer: This note was dictated with voice recognition software. Similar sounding words can inadvertently be transcribed and may not be corrected upon review.   Eilleen Kempf May 31, 2018, 1:33 PM

## 2018-06-01 ENCOUNTER — Encounter: Payer: Self-pay | Admitting: *Deleted

## 2018-06-01 ENCOUNTER — Telehealth: Payer: Self-pay | Admitting: *Deleted

## 2018-06-01 NOTE — Progress Notes (Signed)
I received release of information and faxed requested documentation.

## 2018-06-01 NOTE — Telephone Encounter (Signed)
Oncology Nurse Navigator Documentation  Oncology Nurse Navigator Flowsheets 06/01/2018  Navigator Location [facility]  Navigator Encounter Type Telephone/I received a call from patient.  She would like her pathology report.  I faxed her a release of information request before faxing her the pathology report.   Telephone Outgoing Call  Barriers/Navigation Needs Education;Coordination of Care  Education Other  Interventions Coordination of Care;Education  Coordination of Care Other  Education Method Verbal  Acuity Level 2  Time Spent with Patient 30

## 2018-06-01 NOTE — Telephone Encounter (Signed)
Called patient to update her on the [employer] study.  Informed her that this research RN has requested her tissue from pathology department and I will let her know once we receive it and send it off to the study for testing.  Asked patient if she had time to answer some demographic and medical history for the study and she asked if nurse could call her back next week. Agreed to call patient on Monday.  Thanked patient for her time today.  [name], BSN, RN Clinical Research Nurse 06/01/2018 4:54 PM

## 2018-06-02 ENCOUNTER — Encounter: Payer: Self-pay | Admitting: Internal Medicine

## 2018-06-02 DIAGNOSIS — Z5111 Encounter for antineoplastic chemotherapy: Secondary | ICD-10-CM | POA: Insufficient documentation

## 2018-06-02 DIAGNOSIS — Z7189 Other specified counseling: Secondary | ICD-10-CM | POA: Insufficient documentation

## 2018-06-04 ENCOUNTER — Encounter: Payer: Self-pay | Admitting: *Deleted

## 2018-06-04 NOTE — Progress Notes (Signed)
[facility] [phone]; Adjuvant Lung Cancer Enrichment Marker Identification and Sequencing Trial (ALCHEMIST).   06/04/18 This RN also reviewed patient record for eligibility on the ALCHEMIST trial.  Agree that patient meets all eligibility requirements.  Doreatha Martin, RN, BSN, [facility] 06/04/2018 10:09 AM

## 2018-06-06 ENCOUNTER — Telehealth: Payer: Self-pay | Admitting: *Deleted

## 2018-06-06 NOTE — Telephone Encounter (Addendum)
[facility] [id number]: Adjuvant Lung Cancer Enrichment Marker Identification and Sequencing Trial (ALCHEMIST). This study will not have a treatment plan as it is only to test for genetic markers. Received tumor tissue block from pathology department this afternoon.  Shipped to study per protocol.  Called patient to inform her of tissue sent and reminded her it can take up to 14 days to get results from the study and find out if she is eligible for one of their sub-study treatment trials.  Patient verbalized understanding.  Asked patient study questions related to patient's medical, family, environmental exposure, tobacco use and demographic information. Patient answered all questions without difficulty and this took about 10 minutes. Thanked patient for her time today and participation in this study.  Informed patient research nurse will notify her when results received from the study.  Reminded patient if she is eligible for a sub study, we will need to meet to review and discuss that study prior to consent/enrollment if she is interested.  Patient verbalized understanding.  [name], BSN, RN Clinical Research Nurse 06/06/2018 3:13 PM

## 2018-06-11 NOTE — Pre-Procedure Instructions (Signed)
[name]  06/11/2018      [id number] [address], [place] [address] Phone: [phone] Fax: [phone]    Your procedure is scheduled on Wed., Feb. 26, 2020 from 7:30AM-8:34AM  Report to [facility] Entrance "A" at 5:30AM  Call this number if you have problems the morning of surgery:  [phone]   Remember:  Do not eat or drink after midnight on Feb. 25th    Take these medicines the morning of surgery with A SIP OF WATER: If needed: Acetaminophen (TYLENOL), TraMADol (ULTRAM), EPINEPHrine (EPIPEN 2-PAK), and  Olopatadine HCl (PATANASE)  As of today, stop taking all Aspirin Products, Vitamins, Fish oils, and Herbal medications. Also stop all NSAIDS i.e. Advil, Ibuprofen, Motrin, Aleve, Anaprox, Naproxen, BC, Goody Powders, and all Supplements.    Do not wear jewelry, make-up or nail polish.  Do not wear lotions, powders, or perfumes, or deodorant.  Do not shave 48 hours prior to surgery.    Do not bring valuables to the hospital.  [facility] is not responsible for any belongings or valuables.  Contacts, dentures or bridgework may not be worn into surgery.  Leave your suitcase in the car.  After surgery it may be brought to your room.  For patients admitted to the hospital, discharge time will be determined by your treatment team.  Patients discharged the day of surgery will not be allowed to drive home.   Special instructions:  [facility]- Preparing For Surgery  Before surgery, you can play an important role. Because skin is not sterile, your skin needs to be as free of germs as possible. You can reduce the number of germs on your skin by washing with CHG (chlorahexidine gluconate) Soap before surgery.  CHG is an antiseptic cleaner which kills germs and bonds with the skin to continue killing germs even after washing.    Oral Hygiene is also important to reduce your  risk of infection.  Remember - BRUSH YOUR TEETH THE MORNING OF SURGERY WITH YOUR REGULAR TOOTHPASTE  Please do not use if you have an allergy to CHG or antibacterial soaps. If your skin becomes reddened/irritated stop using the CHG.  Do not shave (including legs and underarms) for at least 48 hours prior to first CHG shower. It is OK to shave your face.  Please follow these instructions carefully.   1. Shower the NIGHT BEFORE SURGERY and the MORNING OF SURGERY with CHG.   2. If you chose to wash your hair, wash your hair first as usual with your normal shampoo.  3. After you shampoo, rinse your hair and body thoroughly to remove the shampoo.  4. Use CHG as you would any other liquid soap. You can apply CHG directly to the skin and wash gently with a scrungie or a clean washcloth.   5. Apply the CHG Soap to your body ONLY FROM THE NECK DOWN.  Do not use on open wounds or open sores. Avoid contact with your eyes, ears, mouth and genitals (private parts). Wash Face and genitals (private parts)  with your normal soap.  6. Wash thoroughly, paying special attention to the area where your surgery will be performed.  7. Thoroughly rinse your body with warm water from the neck down.  8. DO NOT shower/wash with your normal soap after using and rinsing off the CHG Soap.  9. Pat yourself dry with a CLEAN  TOWEL.  10. Wear CLEAN PAJAMAS to bed the night before surgery, wear comfortable clothes the morning of surgery  11. Place CLEAN SHEETS on your bed the night of your first shower and DO NOT SLEEP WITH PETS.  Day of Surgery:  Do not apply any deodorants/lotions.  Please wear clean clothes to the hospital/surgery center.   Remember to brush your teeth WITH YOUR REGULAR TOOTHPASTE.  Please read over the following fact sheets that you were given. Pain Booklet, Coughing and Deep Breathing, MRSA Information and Surgical Site Infection Prevention

## 2018-06-12 ENCOUNTER — Other Ambulatory Visit: Payer: Self-pay

## 2018-06-12 ENCOUNTER — Encounter (HOSPITAL_COMMUNITY): Payer: Self-pay

## 2018-06-12 ENCOUNTER — Encounter (HOSPITAL_COMMUNITY)
Admit: 2018-06-12 | Discharge: 2018-06-12 | Disposition: A | Discharge status: Home / Self Care | Payer: Medicare Other | Attending: Thoracic Surgery (Cardiothoracic Vascular Surgery) | Admitting: Thoracic Surgery (Cardiothoracic Vascular Surgery)

## 2018-06-12 ENCOUNTER — Ambulatory Visit (HOSPITAL_COMMUNITY)
Admission: RE | Admit: 2018-06-12 | Discharge: 2018-06-12 | Disposition: A | Discharge status: Home / Self Care | Payer: Medicare Other | Source: Ambulatory Visit | Attending: Thoracic Surgery (Cardiothoracic Vascular Surgery) | Admitting: Thoracic Surgery (Cardiothoracic Vascular Surgery)

## 2018-06-12 DIAGNOSIS — K219 Gastro-esophageal reflux disease without esophagitis: Secondary | ICD-10-CM | POA: Diagnosis not present

## 2018-06-12 DIAGNOSIS — J9382 Other air leak: Secondary | ICD-10-CM

## 2018-06-12 DIAGNOSIS — Z01818 Encounter for other preprocedural examination: Secondary | ICD-10-CM | POA: Diagnosis not present

## 2018-06-12 DIAGNOSIS — I10 Essential (primary) hypertension: Secondary | ICD-10-CM | POA: Insufficient documentation

## 2018-06-12 LAB — COMPREHENSIVE METABOLIC PANEL
ALT: 17 U/L (ref 0–44)
AST: 22 U/L (ref 15–41)
Albumin: 3.7 g/dL (ref 3.5–5.0)
Alkaline Phosphatase: 63 U/L (ref 38–126)
Anion gap: 5 (ref 5–15)
BUN: 17 mg/dL (ref 8–23)
CO2: 27 mmol/L (ref 22–32)
Calcium: 9.1 mg/dL (ref 8.9–10.3)
Chloride: 105 mmol/L (ref 98–111)
Creatinine, Ser: 0.76 mg/dL (ref 0.44–1.00)
GFR calc Af Amer: >60 mL/min (ref >60)
GFR calc non Af Amer: >60 mL/min (ref >60)
Glucose, Bld: 117 mg/dL — ABNORMAL HIGH (ref 70–99)
Potassium: 3.9 mmol/L (ref 3.5–5.1)
Sodium: 137 mmol/L (ref 135–145)
Total Bilirubin: 0.8 mg/dL (ref 0.3–1.2)
Total Protein: 6.6 g/dL (ref 6.5–8.1)

## 2018-06-12 LAB — PROTIME-INR
INR: 0.9 (ref 0.8–1.2)
Prothrombin Time: 12.3 seconds (ref 11.4–15.2)

## 2018-06-12 LAB — APTT: aPTT: 32 seconds (ref 24–36)

## 2018-06-12 LAB — CBC
HCT: 37.9 % (ref 36.0–46.0)
Hemoglobin: 11.6 g/dL — ABNORMAL LOW (ref 12.0–15.0)
MCH: 30.1 pg (ref 26.0–34.0)
MCHC: 30.6 g/dL (ref 30.0–36.0)
MCV: 98.4 fL (ref 80.0–100.0)
Platelets: 351 10*3/uL (ref 150–400)
RBC: 3.85 MIL/uL — ABNORMAL LOW (ref 3.87–5.11)
RDW: 12.4 % (ref 11.5–15.5)
WBC: 9.2 10*3/uL (ref 4.0–10.5)
nRBC: 0 % (ref 0.0–0.2)

## 2018-06-12 LAB — SURGICAL PCR SCREEN
MRSA, PCR: NEGATIVE
Staphylococcus aureus: NEGATIVE

## 2018-06-12 NOTE — Progress Notes (Signed)
PCP - Dr. Reynaldo Minium  Cardiologist - Dr. Jennette Dubin  Chest x-ray - 06/12/2018  EKG - 04/24/2018 (E) & 05/23/2018 (CE)-Req'd  Stress Test - Denies  ECHO - Denies  Cardiac Cath - Denies  AICD-na PM-na LOOP-na  Sleep Study - Denies CPAP - None  LABS- 06/12/2018: CBC, CMP, PT, PTT  ASA- Denies  Anesthesia- Yes- cardiac history/req'd ekgEbony Hail PA for anesthesia made aware.  Pt denies having chest pain, sob, or fever at this time. All instructions explained to the pt, with a verbal understanding of the material. Pt agrees to go over the instructions while at home for a better understanding. The opportunity to ask questions was provided.

## 2018-06-12 NOTE — Progress Notes (Signed)
Anesthesia Chart Review:  Case:  [id number] Date/Time:  06/13/18 0715   Procedure:  VIDEO BRONCHOSCOPY WITH REMOVAL OF INTERBRONCHIAL VALVE (IBV) (N/A )   Anesthesia type:  General   Pre-op diagnosis:  PROLONGED AIRLEAK   Location:  [facility] OR ROOM 80 / [place] OR   Surgeon:  [name], MD      DISCUSSION: Patient is a 68 year old female scheduled for the above procedure.  History includes never smoker, lung cancer stage IIA non-small cell (adenocarcinoma, diagnosed 03/07/18; s/p left VATS, LL lobectomy, mediastinal LN dissection 07/20/29, complicated by prolonged air leak, s/p left chest tube and insertion of interbronchial valve 05/10/18), HTN, atherosclerosis (scattered aortic and coronary calcifications by CT/PET), GERD, HLD, anemia.  She has tolerated three prior lung procedures--one being VATS/LL Lobectomy--since 02/2018. She was admitted 05/23/18-05/25/18 for left pleural effusion, s/p thoracentesis. She denied any acute symptoms of chest pain, SOB, and fever at PAT RN visit. Preoperative CXR is still in process, but based on currently available information, I would anticipate that she can proceed as planned.   VS: BP 132/65   Pulse 91   Temp 36.7 C   Resp 18   Ht 5' 4.5" (1.638 m)   Wt 66 kg   SpO2 98%   BMI 24.61 kg/m   PROVIDERS: Donald Prose, MD is listed as PCP [name], MD is HEM-ONC. Last visit 05/31/18.  May-DePaulo, Hassan Rowan, DO is cardiologist [facility]). 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 Records requested.     LABS: Labs reviewed: Acceptable for surgery. (all labs ordered are listed, but only abnormal results are displayed)  Labs Reviewed  CBC - Abnormal; Notable for the following components:      Result Value   RBC 3.85 (*)    Hemoglobin 11.6 (*)    All other components within normal limits  COMPREHENSIVE METABOLIC PANEL - Abnormal; Notable for the following components:   Glucose, Bld 117 (*)    All other components  within normal limits  SURGICAL PCR SCREEN  APTT  PROTIME-INR    IMAGES: CXR 06/12/18: In process.  EKG: - EKG 05/23/18 [facility]): Tracing requested. Result Impression: SR normal P axis, V-rate 60-99 Sinus rhythm ASMIQ Q >78mS, T neg, V1-V2 Anteroseptal infarct, age indeterminate  - EKG 04/24/18:  Normal sinus rhythm Normal ECG No significant change since last tracing Confirmed by Martinique, Peter [phone]) on 04/24/2018 5:32:36 PM  CV: She reported a stress test ~ 5 years ago in [place]. She believes she may have had an echo around 2018 at [facility]; however, there is no record of echo report in available [facility] records. Records requested from [place].   Past Medical History:  Diagnosis Date  . Anemia   . Atherosclerosis   . GERD (gastroesophageal reflux disease)   . Hemoptysis   . Hyperlipidemia   . Hypertension   . IBS (irritable bowel syndrome)   . Lung mass   . Migraines   . Spondylosis   . Vitamin D deficiency     Past Surgical History:  Procedure Laterality Date  . BROW LIFT AND BLEPHAROPLASTY  03/15/2013  . CATARACT EXTRACTION, BILATERAL  06/23/2014  . CHEST TUBE INSERTION Left 05/10/2018   Procedure: CHEST TUBE INSERTION;  Surgeon: [name], MD;  Location: [place];  Service: Thoracic;  Laterality: Left;  . COLONOSCOPY  05/2014  . ESOPHAGOGASTRODUODENOSCOPY  2019  . EYE SURGERY    .  IR THORACENTESIS ASP PLEURAL SPACE W/IMG GUIDE  05/24/2018  . TONSILLECTOMY    . VIDEO ASSISTED THORACOSCOPY (VATS)/ LOBECTOMY Left 04/25/2018   Procedure: VIDEO ASSISTED THORACOSCOPY (VATS)LEFT LOWER LOBECTOMY, NODE DISSECTION;  Surgeon: [name], MD;  Location: [place];  Service: Thoracic;  Laterality: Left;  Marland Kitchen VIDEO BRONCHOSCOPY WITH ENDOBRONCHIAL NAVIGATION N/A 03/07/2018   Procedure: VIDEO BRONCHOSCOPY WITH ENDOBRONCHIAL NAVIGATION;  Surgeon: Collene Gobble,  MD;  Location: [facility] OR;  Service: Thoracic;  Laterality: N/A;  . VIDEO BRONCHOSCOPY WITH INSERTION OF INTERBRONCHIAL VALVE (IBV) N/A 05/10/2018   Procedure: VIDEO BRONCHOSCOPY WITH INSERTION OF INTERBRONCHIAL VALVE (IBV);  Surgeon: [name], MD;  Location: [facility] OR;  Service: Thoracic;  Laterality: N/A;    MEDICATIONS: . acetaminophen (TYLENOL) 160 MG/5ML solution  . beta carotene 25000 UNIT capsule  . Cholecalciferol (VITAMIN D3) 50 MCG (2000 UT) TABS  . Coenzyme Q10 (COQ10) 200 MG CAPS  . EPINEPHrine (EPIPEN 2-PAK) 0.3 mg/0.3 mL IJ SOAJ injection  . Levomefolate Glucosamine (METHYLFOLATE PO)  . Multiple Vitamins-Minerals (EMERGEN-C IMMUNE PO)  . Olopatadine HCl (PATANASE) 0.6 % SOLN  . Omega-3 1000 MG CAPS  . Probiotic Product (PROBIOTIC PO)  . Riboflavin 100 MG CAPS  . rizatriptan (MAXALT) 10 MG tablet  . rosuvastatin (CRESTOR) 5 MG tablet  . sucralfate (CARAFATE) 1 g tablet  . traMADol (ULTRAM) 50 MG tablet  . vitamin A 8000 UNIT capsule  . vitamin B-12 (CYANOCOBALAMIN) 1000 MCG tablet   No current facility-administered medications for this encounter.     Myra Gianotti, PA-C Surgical Short Stay/Anesthesiology [facility] Phone [phone] [facility] Phone [phone] 06/12/2018 4:58 PM

## 2018-06-13 ENCOUNTER — Encounter (HOSPITAL_COMMUNITY): Payer: Self-pay | Admitting: Surgery

## 2018-06-13 ENCOUNTER — Ambulatory Visit (HOSPITAL_COMMUNITY)
Admission: RE | Admit: 2018-06-13 | Discharge: 2018-06-13 | Disposition: A | Discharge status: Home / Self Care | Payer: Medicare Other | Attending: Thoracic Surgery (Cardiothoracic Vascular Surgery) | Admitting: Thoracic Surgery (Cardiothoracic Vascular Surgery)

## 2018-06-13 ENCOUNTER — Ambulatory Visit (HOSPITAL_COMMUNITY): Payer: Medicare Other | Admitting: Certified Registered Nurse Anesthetist

## 2018-06-13 ENCOUNTER — Ambulatory Visit (HOSPITAL_COMMUNITY): Payer: Medicare Other | Admitting: Vascular Surgery

## 2018-06-13 ENCOUNTER — Other Ambulatory Visit: Payer: Self-pay | Admitting: Thoracic Surgery (Cardiothoracic Vascular Surgery)

## 2018-06-13 ENCOUNTER — Ambulatory Visit (HOSPITAL_COMMUNITY): Payer: Medicare Other

## 2018-06-13 ENCOUNTER — Encounter (HOSPITAL_COMMUNITY)
Admission: RE | Disposition: A | Discharge status: Home / Self Care | Payer: Self-pay | Source: Home / Self Care | Attending: Thoracic Surgery (Cardiothoracic Vascular Surgery)

## 2018-06-13 DIAGNOSIS — J9811 Atelectasis: Secondary | ICD-10-CM | POA: Diagnosis not present

## 2018-06-13 DIAGNOSIS — Z902 Acquired absence of lung [part of]: Secondary | ICD-10-CM | POA: Insufficient documentation

## 2018-06-13 DIAGNOSIS — K219 Gastro-esophageal reflux disease without esophagitis: Secondary | ICD-10-CM | POA: Insufficient documentation

## 2018-06-13 DIAGNOSIS — I10 Essential (primary) hypertension: Secondary | ICD-10-CM | POA: Diagnosis not present

## 2018-06-13 DIAGNOSIS — C3490 Malignant neoplasm of unspecified part of unspecified bronchus or lung: Secondary | ICD-10-CM

## 2018-06-13 DIAGNOSIS — Z9889 Other specified postprocedural states: Secondary | ICD-10-CM

## 2018-06-13 DIAGNOSIS — J9382 Other air leak: Secondary | ICD-10-CM | POA: Insufficient documentation

## 2018-06-13 DIAGNOSIS — Z9689 Presence of other specified functional implants: Secondary | ICD-10-CM | POA: Diagnosis not present

## 2018-06-13 DIAGNOSIS — J95812 Postprocedural air leak: Secondary | ICD-10-CM | POA: Diagnosis not present

## 2018-06-13 HISTORY — PX: VIDEO BRONCHOSCOPY WITH INSERTION OF INTERBRONCHIAL VALVE (IBV): SHX6178

## 2018-06-13 SURGERY — BRONCHOSCOPY, FLEXIBLE, WITH INTRABRONCHIAL VALVE INSERTION
Anesthesia: General

## 2018-06-13 MED ORDER — FENTANYL CITRATE (PF) 250 MCG/5ML IJ SOLN
INTRAMUSCULAR | Status: DC | PRN
  Administered 2018-06-13: 100 ug via INTRAVENOUS
  Administered 2018-06-13: 50 ug via INTRAVENOUS

## 2018-06-13 MED ORDER — DEXAMETHASONE SODIUM PHOSPHATE 10 MG/ML IJ SOLN
INTRAMUSCULAR | Status: DC | PRN
  Administered 2018-06-13: 10 mg via INTRAVENOUS

## 2018-06-13 MED ORDER — ROCURONIUM BROMIDE 100 MG/10ML IV SOLN
INTRAVENOUS | Status: DC | PRN
  Administered 2018-06-13: 35 mg via INTRAVENOUS

## 2018-06-13 MED ORDER — MEPERIDINE HCL 50 MG/ML IJ SOLN: 6.2500 mg | INTRAMUSCULAR | Status: DC | PRN

## 2018-06-13 MED ORDER — ROCURONIUM BROMIDE 50 MG/5ML IV SOSY
PREFILLED_SYRINGE | INTRAVENOUS | Status: AC
  Filled 2018-06-13: qty 5

## 2018-06-13 MED ORDER — MENTHOL 3 MG MT LOZG
LOZENGE | OROMUCOSAL | Status: AC
  Filled 2018-06-13: qty 9

## 2018-06-13 MED ORDER — ONDANSETRON HCL 4 MG/2ML IJ SOLN: 4.0000 mg | Freq: Once | INTRAMUSCULAR | Status: DC | PRN

## 2018-06-13 MED ORDER — EPHEDRINE SULFATE 50 MG/ML IJ SOLN
INTRAMUSCULAR | Status: DC | PRN
  Administered 2018-06-13: 5 mg via INTRAVENOUS

## 2018-06-13 MED ORDER — MIDAZOLAM HCL 5 MG/5ML IJ SOLN
INTRAMUSCULAR | Status: DC | PRN
  Administered 2018-06-13: 2 mg via INTRAVENOUS

## 2018-06-13 MED ORDER — LACTATED RINGERS IV SOLN
INTRAVENOUS | Status: DC | PRN
  Administered 2018-06-13: 07:00:00 via INTRAVENOUS

## 2018-06-13 MED ORDER — ONDANSETRON HCL 4 MG/2ML IJ SOLN
INTRAMUSCULAR | Status: AC
  Filled 2018-06-13: qty 2

## 2018-06-13 MED ORDER — FENTANYL CITRATE (PF) 250 MCG/5ML IJ SOLN
INTRAMUSCULAR | Status: AC
  Filled 2018-06-13: qty 5

## 2018-06-13 MED ORDER — SUCCINYLCHOLINE CHLORIDE 20 MG/ML IJ SOLN
INTRAMUSCULAR | Status: DC | PRN
  Administered 2018-06-13: 120 mg via INTRAVENOUS

## 2018-06-13 MED ORDER — PHENYLEPHRINE 40 MCG/ML (10ML) SYRINGE FOR IV PUSH (FOR BLOOD PRESSURE SUPPORT)
PREFILLED_SYRINGE | INTRAVENOUS | Status: AC
  Filled 2018-06-13: qty 10

## 2018-06-13 MED ORDER — EPHEDRINE 5 MG/ML INJ
INTRAVENOUS | Status: AC
  Filled 2018-06-13: qty 10

## 2018-06-13 MED ORDER — DEXAMETHASONE SODIUM PHOSPHATE 10 MG/ML IJ SOLN
INTRAMUSCULAR | Status: AC
  Filled 2018-06-13: qty 2

## 2018-06-13 MED ORDER — MENTHOL 3 MG MT LOZG
1.0000 | LOZENGE | OROMUCOSAL | Status: DC | PRN
  Administered 2018-06-13: 3 mg via ORAL

## 2018-06-13 MED ORDER — EPINEPHRINE PF 1 MG/ML IJ SOLN
INTRAMUSCULAR | Status: AC
  Filled 2018-06-13: qty 1

## 2018-06-13 MED ORDER — LIDOCAINE 2% (20 MG/ML) 5 ML SYRINGE
INTRAMUSCULAR | Status: AC
  Filled 2018-06-13: qty 5

## 2018-06-13 MED ORDER — SUCCINYLCHOLINE CHLORIDE 200 MG/10ML IV SOSY
PREFILLED_SYRINGE | INTRAVENOUS | Status: AC
  Filled 2018-06-13: qty 10

## 2018-06-13 MED ORDER — PROPOFOL 10 MG/ML IV BOLUS
INTRAVENOUS | Status: AC
  Filled 2018-06-13: qty 20

## 2018-06-13 MED ORDER — SUGAMMADEX SODIUM 200 MG/2ML IV SOLN
INTRAVENOUS | Status: DC | PRN
  Administered 2018-06-13: 150 mg via INTRAVENOUS

## 2018-06-13 MED ORDER — LIDOCAINE HCL (CARDIAC) PF 100 MG/5ML IV SOSY
PREFILLED_SYRINGE | INTRAVENOUS | Status: DC | PRN
  Administered 2018-06-13: 60 mg via INTRAVENOUS

## 2018-06-13 MED ORDER — PHENYLEPHRINE HCL 10 MG/ML IJ SOLN
INTRAMUSCULAR | Status: DC | PRN
  Administered 2018-06-13 (×2): 120 ug via INTRAVENOUS
  Administered 2018-06-13: 160 ug via INTRAVENOUS
  Administered 2018-06-13 (×4): 120 ug via INTRAVENOUS

## 2018-06-13 MED ORDER — ONDANSETRON HCL 4 MG/2ML IJ SOLN
INTRAMUSCULAR | Status: DC | PRN
  Administered 2018-06-13: 4 mg via INTRAVENOUS

## 2018-06-13 MED ORDER — MIDAZOLAM HCL 2 MG/2ML IJ SOLN
INTRAMUSCULAR | Status: AC
  Filled 2018-06-13: qty 2

## 2018-06-13 SURGICAL SUPPLY — 22 items
ADAPTER VALVE BIOPSY EBUS (MISCELLANEOUS) IMPLANT
ADPTR VALVE BIOPSY EBUS (MISCELLANEOUS)
BASKET PULM ZERO TIP 12X120 (MISCELLANEOUS) ×2 IMPLANT
CANISTER SUCT 3000ML PPV (MISCELLANEOUS) ×2 IMPLANT
CONT SPEC 4OZ CLIKSEAL STRL BL (MISCELLANEOUS) ×2 IMPLANT
COVER BACK TABLE 60X90IN (DRAPES) ×2 IMPLANT
FILTER STRAW FLUID ASPIR (MISCELLANEOUS) IMPLANT
FORCEPS BIOP RJ4 1.8 (CUTTING FORCEPS) ×2 IMPLANT
GAUZE SPONGE 4X4 12PLY STRL (GAUZE/BANDAGES/DRESSINGS) ×2 IMPLANT
GLOVE SURG SIGNA 7.5 PF LTX (GLOVE) ×2 IMPLANT
GOWN STRL REUS W/ TWL XL LVL3 (GOWN DISPOSABLE) ×1 IMPLANT
GOWN STRL REUS W/TWL XL LVL3 (GOWN DISPOSABLE) ×1
KIT CLEAN ENDO COMPLIANCE (KITS) ×2 IMPLANT
NS IRRIG 1000ML POUR BTL (IV SOLUTION) ×2 IMPLANT
OIL SILICONE PENTAX (PARTS (SERVICE/REPAIRS)) IMPLANT
PAD ARMBOARD 7.5X6 YLW CONV (MISCELLANEOUS) ×2 IMPLANT
SYR 10ML LL (SYRINGE) ×2 IMPLANT
TOWEL GREEN STERILE (TOWEL DISPOSABLE) ×2 IMPLANT
TRAP SPECIMEN MUCOUS 40CC (MISCELLANEOUS) ×2 IMPLANT
TUBE CONNECTING 20X1/4 (TUBING) ×2 IMPLANT
UNDERPAD 30X30 (UNDERPADS AND DIAPERS) ×2 IMPLANT
VALVE SUCTION BRONCHIO DISP (MISCELLANEOUS) ×2 IMPLANT

## 2018-06-13 NOTE — Anesthesia Preprocedure Evaluation (Signed)
Anesthesia Evaluation  Patient identified by MRN, date of birth, ID band Patient awake    Reviewed: Allergy & Precautions, NPO status , Patient's Chart, lab work & pertinent test results  Airway Mallampati: I  TM Distance: >3 FB Neck ROM: Full    Dental   Pulmonary    Pulmonary exam normal        Cardiovascular hypertension, Pt. on medications Normal cardiovascular exam     Neuro/Psych    GI/Hepatic GERD  Medicated and Controlled,  Endo/Other    Renal/GU      Musculoskeletal   Abdominal   Peds  Hematology   Anesthesia Other Findings   Reproductive/Obstetrics                             Anesthesia Physical Anesthesia Plan  ASA: III  Anesthesia Plan: General   Post-op Pain Management:    Induction: Intravenous  PONV Risk Score and Plan: 3  Airway Management Planned: Oral ETT  Additional Equipment:   Intra-op Plan:   Post-operative Plan: Extubation in OR  Informed Consent: I have reviewed the patients History and Physical, chart, labs and discussed the procedure including the risks, benefits and alternatives for the proposed anesthesia with the patient or authorized representative who has indicated his/her understanding and acceptance.       Plan Discussed with: CRNA and Surgeon  Anesthesia Plan Comments:         Anesthesia Quick Evaluation

## 2018-06-13 NOTE — Interval H&P Note (Signed)
History and Physical Interval Note: CXR shows atelectasis +/- effusion on left Will proceed with valve removal 06/13/2018 7:25 AM  [name]  has presented today for surgery, with the diagnosis of PROLONGED AIRLEAK  The various methods of treatment have been discussed with the patient and family. After consideration of risks, benefits and other options for treatment, the patient has consented to  Procedure(s): VIDEO BRONCHOSCOPY WITH REMOVAL OF INTERBRONCHIAL VALVE (IBV) (N/A) as a surgical intervention .  The patient's history has been reviewed, patient examined, no change in status, stable for surgery.  I have reviewed the patient's chart and labs.  Questions were answered to the patient's satisfaction.     [name]

## 2018-06-13 NOTE — Anesthesia Postprocedure Evaluation (Signed)
Anesthesia Post Note  Patient: [name]  Procedure(s) Performed: VIDEO BRONCHOSCOPY WITH REMOVAL OF INTERBRONCHIAL VALVE (IBV) x 2 (N/A )     Patient location during evaluation: PACU Anesthesia Type: General Level of consciousness: awake and alert Pain management: pain level controlled Vital Signs Assessment: post-procedure vital signs reviewed and stable Respiratory status: spontaneous breathing, nonlabored ventilation, respiratory function stable and patient connected to nasal cannula oxygen Cardiovascular status: blood pressure returned to baseline and stable Postop Assessment: no apparent nausea or vomiting Anesthetic complications: no    Last Vitals:  Vitals:   06/13/18 0945 06/13/18 1000  BP: (!) 152/71 (!) 154/80  Pulse: 93 89  Resp: 20 14  Temp:  36.5 C  SpO2: 94% 95%    Last Pain:  Vitals:   06/13/18 1000  PainSc: 0-No pain                 Kanai Hilger DAVID

## 2018-06-13 NOTE — Transfer of Care (Signed)
Immediate Anesthesia Transfer of Care Note  Patient: [name]  Procedure(s) Performed: VIDEO BRONCHOSCOPY WITH REMOVAL OF INTERBRONCHIAL VALVE (IBV) x 2 (N/A )  Patient Location: PACU  Anesthesia Type:General  Level of Consciousness: awake, alert , oriented and patient cooperative  Airway & Oxygen Therapy: Patient Spontanous Breathing and Patient connected to nasal cannula oxygen  Post-op Assessment: Report given to RN and Post -op Vital signs reviewed and stable  Post vital signs: Reviewed and stable  Last Vitals:  Vitals Value Taken Time  BP 165/68 06/13/2018  8:51 AM  Temp 36.6 C 06/13/2018  8:51 AM  Pulse 102 06/13/2018  8:57 AM  Resp 20 06/13/2018  8:57 AM  SpO2 99 % 06/13/2018  8:57 AM  Vitals shown include unvalidated device data.  Last Pain:  Vitals:   06/13/18 0851  PainSc: 0-No pain      Patients Stated Pain Goal: 4 (70/76/15 1834)  Complications: No apparent anesthesia complications

## 2018-06-13 NOTE — Brief Op Note (Signed)
06/13/2018  8:53 AM  PATIENT:  [name]  68 y.o. female  PRE-OPERATIVE DIAGNOSIS:  PROLONGED AIRLEAK  POST-OPERATIVE DIAGNOSIS:  PROLONGED AIRLEAK  PROCEDURE:  Procedure(s): VIDEO BRONCHOSCOPY WITH REMOVAL OF INTERBRONCHIAL VALVE (IBV) x 2 (N/A)  SURGEON:  Surgeon(s) and Role:    * [name], MD - Primary  PHYSICIAN ASSISTANT:   ASSISTANTS: none   ANESTHESIA:   general  EBL: minimal  BLOOD ADMINISTERED:none  DRAINS: none   LOCAL MEDICATIONS USED:  NONE  SPECIMEN:  No Specimen  DISPOSITION OF SPECIMEN:  N/A  COUNTS:  NO endoscopic  TOURNIQUET:  * No tourniquets in log *  DICTATION: .Other Dictation: Dictation Number -  PLAN OF CARE: Discharge to home after PACU  PATIENT DISPOSITION:  PACU - hemodynamically stable.   Delay start of Pharmacological VTE agent (>24hrs) due to surgical blood loss or risk of bleeding: not applicable

## 2018-06-13 NOTE — Op Note (Signed)
NAMEZENOLA, DEZARN MEDICAL RECORD [id number] ACCOUNT [id number] DATE OF BIRTH:[birth year] FACILITY: [facility] LOCATION: [facility] PHYSICIAN:Zayveon Raschke C. Sherolyn Trettin, MD  OPERATIVE REPORT  DATE OF PROCEDURE:  06/13/2018  PREOPERATIVE DIAGNOSIS:  Intrabronchial valves in place for prolonged air leak.  POSTOPERATIVE DIAGNOSIS:  Intrabronchial valves in place for prolonged air leak.  PROCEDURE:  Bronchoscopy for removal of intrabronchial valves.  SURGEON:  Modesto Charon, MD  ASSISTANT:  None.  ANESTHESIA:  General.  FINDINGS:  The lingular bronchial valve easily grasped and removed.  The stem of the valve in anterior apical posterior bronchus was displaced superiorly but removed intact.  CLINICAL NOTE:  The patient is a 68 year old woman who had undergone a left lower lobectomy.  She had a prolonged air leak afterwards eventually requiring placement of intrabronchial valves.  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  She now returns for removal of the intrabronchial valves.  The indications, risks, benefits, and alternatives were discussed in detail with the patient.  She understood the risks and  agreed to proceed.  DESCRIPTION OF PROCEDURE:  The patient was brought to the preoperative holding area on 06/13/2018.  Her x-ray from the day prior showed complete atelectasis of the left upper lobe.  The valves were noted to be in place.  She was taken to the Operating  Room, anesthetized and intubated.  A timeout was performed.  Flexible fiberoptic bronchoscopy was then performed via the endotracheal tube.  On the left side, the left lower lobe bronchus was intact except for a small, possibly about a millimeter gap  consistent with the findings on the previous bronchoscopy.  Again it was not clear if the gap was superficial or deep.  The valves were in place.  The stem on the lingular bronchial valve was  grasped and the valve was removed.  There was significant mucus behind the valve, which was evacuated with suction.  The  scope was redirected to the valve and the common bronchus to the anterior apical and posterior segments.  The stem on this valve was displaced superiorly.  It was difficult to get the proper angle on the stem to grasp with the biopsy forceps.  An attempt  was made to grab one of the struts of the valve and pull, but the valve would not move significantly with that maneuver.  A snare was placed and I was able to get the snare around the stem and pull slightly.  I reoriented the orientation of the stem of  the valve.  The valve stem then could be grasped with the biopsy forceps and the valve was removed.  Again, there was significant mucus behind the valve.  Once it was removed, this was cleared with multiple irrigations with normal saline.  The airways  were inspected.  There was some swelling in the mucosa around the valves, but otherwise unremarkable.  The patient then was extubated in the operating room and taken to the [facility] in good condition.  TN/NUANCE  D:06/13/2018 T:06/13/2018 JOB:[id number]

## 2018-06-13 NOTE — Anesthesia Procedure Notes (Signed)
Procedure Name: Intubation Date/Time: 06/13/2018 7:49 AM Performed by: Shirlyn Goltz, CRNA Pre-anesthesia Checklist: Patient identified, Emergency Drugs available, Suction available and Patient being monitored Patient Re-evaluated:Patient Re-evaluated prior to induction Oxygen Delivery Method: Circle system utilized Preoxygenation: Pre-oxygenation with 100% oxygen Induction Type: IV induction Ventilation: Mask ventilation without difficulty Laryngoscope Size: Glidescope and 4 Grade View: Grade I Tube type: Oral Tube size: 8.0 mm Number of attempts: 1 Airway Equipment and Method: Rigid stylet and Video-laryngoscopy Placement Confirmation: ETT inserted through vocal cords under direct vision,  positive ETCO2 and breath sounds checked- equal and bilateral Secured at: 21 cm Tube secured with: Tape Dental Injury: Teeth and Oropharynx as per pre-operative assessment

## 2018-06-13 NOTE — Discharge Instructions (Addendum)
Do not drive or engage in heavy physical activity for 24 hours.  You may resume normal activities tomorrow.  You may use acetaminophen (Tylenol) if needed for discomfort.  You may cough up small amounts of blood over the next few days.  Call [phone] if you develop chest pain, shortness of breath, fever > 101 F or cough up more than a tablespoon of blood.  Use your incentive spirometer and/ or flutter valve every couple of hours while awake for the next week.  My office will contact you with follow up information.

## 2018-06-14 ENCOUNTER — Other Ambulatory Visit: Payer: Self-pay

## 2018-06-14 ENCOUNTER — Ambulatory Visit
Admission: RE | Admit: 2018-06-14 | Discharge: 2018-06-14 | Disposition: A | Discharge status: Home / Self Care | Payer: Medicare Other | Source: Ambulatory Visit | Attending: Thoracic Surgery (Cardiothoracic Vascular Surgery) | Admitting: Thoracic Surgery (Cardiothoracic Vascular Surgery)

## 2018-06-14 ENCOUNTER — Ambulatory Visit (HOSPITAL_COMMUNITY)
Admission: RE | Admit: 2018-06-14 | Discharge: 2018-06-14 | Disposition: A | Discharge status: Home / Self Care | Payer: Medicare Other | Source: Ambulatory Visit | Attending: Radiology | Admitting: Radiology

## 2018-06-14 ENCOUNTER — Other Ambulatory Visit (HOSPITAL_COMMUNITY): Payer: Self-pay | Admitting: Radiology

## 2018-06-14 ENCOUNTER — Encounter (HOSPITAL_COMMUNITY): Payer: Self-pay | Admitting: Thoracic Surgery (Cardiothoracic Vascular Surgery)

## 2018-06-14 ENCOUNTER — Ambulatory Visit (HOSPITAL_COMMUNITY)
Admission: RE | Admit: 2018-06-14 | Discharge: 2018-06-14 | Disposition: A | Discharge status: Home / Self Care | Payer: Medicare Other | Source: Ambulatory Visit | Attending: Physician Assistant | Admitting: Physician Assistant

## 2018-06-14 ENCOUNTER — Ambulatory Visit (INDEPENDENT_AMBULATORY_CARE_PROVIDER_SITE_OTHER): Payer: Medicare Other | Admitting: Physician Assistant

## 2018-06-14 VITALS — BP 160/87 | HR 100 | Resp 18 | Ht 64.5 in | Wt 146.6 lb

## 2018-06-14 DIAGNOSIS — J90 Pleural effusion, not elsewhere classified: Secondary | ICD-10-CM | POA: Insufficient documentation

## 2018-06-14 DIAGNOSIS — Z902 Acquired absence of lung [part of]: Secondary | ICD-10-CM

## 2018-06-14 DIAGNOSIS — Z9889 Other specified postprocedural states: Secondary | ICD-10-CM

## 2018-06-14 DIAGNOSIS — C3490 Malignant neoplasm of unspecified part of unspecified bronchus or lung: Secondary | ICD-10-CM

## 2018-06-14 HISTORY — PX: IR THORACENTESIS ASP PLEURAL SPACE W/IMG GUIDE: IMG5380

## 2018-06-14 LAB — GRAM STAIN

## 2018-06-14 MED ORDER — LIDOCAINE HCL 1 % IJ SOLN
INTRAMUSCULAR | Status: AC
  Filled 2018-06-14: qty 20

## 2018-06-14 MED ORDER — LIDOCAINE HCL 1 % IJ SOLN
INTRAMUSCULAR | Status: DC | PRN
  Administered 2018-06-14: 10 mL

## 2018-06-14 NOTE — Procedures (Signed)
Ultrasound-guided diagnostic and therapeutic left thoracentesis performed yielding 750 cc of slightly hazy, yellow fluid. No immediate complications. Follow-up chest x-ray pending. The fluid was sent to the lab for cytology and culture. EBL< 2cc.

## 2018-06-14 NOTE — Progress Notes (Signed)
HPI:  Patient returns for CXR after having her IBV removed.  She is S/P Left VATS with lobectomy for lung cancer.  This was complicated with pleural effusion requiring thoracentesis performed on 05/24/2018.  Currently she is feeling worn out.  She notes that her neck feels a little swollen.  She does not have a sore throat, but has been having issues with laryngitis.  She notes she is flushed which she thinks she may be related to Fentanyl used during her procedure.  She is concerned that she will continue to have recurrent issues with fluid accumulation.  Current Outpatient Medications  Medication Sig Dispense Refill  . acetaminophen (TYLENOL) 160 MG/5ML solution Take 31.3 mLs (1,000 mg total) by mouth every 6 (six) hours as needed for moderate pain. 120 mL 0  . beta carotene 25000 UNIT capsule Take 25,000 Units by mouth every other day.    . Cholecalciferol (VITAMIN D3) 50 MCG (2000 UT) TABS Take 4,000 Units by mouth daily.    . Coenzyme Q10 (COQ10) 200 MG CAPS Take 200 mg by mouth daily.    Marland Kitchen EPINEPHrine (EPIPEN 2-PAK) 0.3 mg/0.3 mL IJ SOAJ injection Inject 0.3 mg into the muscle once.    . Levomefolate Glucosamine (METHYLFOLATE PO) Place 800 mcg under the tongue every other day.    . Multiple Vitamins-Minerals (EMERGEN-C IMMUNE PO) Take 1 packet by mouth daily as needed (for immune system).    . Olopatadine HCl (PATANASE) 0.6 % SOLN Place 1-2 sprays into the nose 2 (two) times daily as needed (for congestion).     . Omega-3 1000 MG CAPS Take 2,000 mg by mouth daily.     . Probiotic Product (PROBIOTIC PO) Take 1 capsule by mouth daily.    . Riboflavin 100 MG CAPS Take 200 mg by mouth every other day.     . rizatriptan (MAXALT) 10 MG tablet Take 5 mg by mouth as needed for migraine. May repeat in 2 hours if needed     . rosuvastatin (CRESTOR) 5 MG tablet Take 5 mg by mouth daily.    . sucralfate (CARAFATE) 1 g tablet Take 1 g by mouth See admin instructions. Crush and drink 1 g by mouth 2-3 times  daily    . traMADol (ULTRAM) 50 MG tablet Take 1 tablet (50 mg total) by mouth every 6 (six) hours as needed for severe pain. 28 tablet 0  . vitamin A 8000 UNIT capsule Take 8,000 Units by mouth every other day.    . vitamin B-12 (CYANOCOBALAMIN) 1000 MCG tablet Take 1,000 mcg by mouth every other day.     No current facility-administered medications for this visit.     Physical Exam:  BP (!) 160/87 (BP Location: Right Arm, Patient Position: Sitting, Cuff Size: Normal)   Pulse 100   Resp 18   Ht 5' 4.5" (1.638 m)   Wt 146 lb 9.6 oz (66.5 kg)   SpO2 94% Comment: RA  BMI 24.78 kg/m   Gen: no apparent distress Heart: RRR Lungs: diminished left base Abd: soft non-tender, non-distended Incision: clean and dry  A/P;  1. S/P Removal of IBV 2. Left Hydropneumothorax- CXR reviewed with Dr.  Roxan Hockey... it is felt patient will require thoracentesis 3. Dispo- for Thoracentesis today, follow up next with repeat CXR with [place], PA-C [facility] [phone]

## 2018-06-18 ENCOUNTER — Other Ambulatory Visit: Payer: Self-pay | Admitting: Thoracic Surgery (Cardiothoracic Vascular Surgery)

## 2018-06-18 DIAGNOSIS — C3490 Malignant neoplasm of unspecified part of unspecified bronchus or lung: Secondary | ICD-10-CM

## 2018-06-19 ENCOUNTER — Ambulatory Visit (INDEPENDENT_AMBULATORY_CARE_PROVIDER_SITE_OTHER): Payer: Medicare Other | Admitting: Thoracic Surgery (Cardiothoracic Vascular Surgery)

## 2018-06-19 ENCOUNTER — Other Ambulatory Visit: Payer: Self-pay

## 2018-06-19 ENCOUNTER — Ambulatory Visit
Admission: RE | Admit: 2018-06-19 | Discharge: 2018-06-19 | Disposition: A | Discharge status: Home / Self Care | Payer: Medicare Other | Source: Ambulatory Visit | Attending: Thoracic Surgery (Cardiothoracic Vascular Surgery) | Admitting: Thoracic Surgery (Cardiothoracic Vascular Surgery)

## 2018-06-19 ENCOUNTER — Encounter: Payer: Self-pay | Admitting: Thoracic Surgery (Cardiothoracic Vascular Surgery)

## 2018-06-19 ENCOUNTER — Other Ambulatory Visit: Payer: Self-pay | Admitting: Thoracic Surgery (Cardiothoracic Vascular Surgery)

## 2018-06-19 VITALS — BP 170/100 | HR 99 | Resp 16 | Ht 64.5 in | Wt 146.0 lb

## 2018-06-19 DIAGNOSIS — C3490 Malignant neoplasm of unspecified part of unspecified bronchus or lung: Secondary | ICD-10-CM

## 2018-06-19 DIAGNOSIS — C3492 Malignant neoplasm of unspecified part of left bronchus or lung: Secondary | ICD-10-CM

## 2018-06-19 DIAGNOSIS — J9382 Other air leak: Secondary | ICD-10-CM

## 2018-06-19 DIAGNOSIS — Z902 Acquired absence of lung [part of]: Secondary | ICD-10-CM

## 2018-06-19 LAB — CULTURE, BODY FLUID W GRAM STAIN -BOTTLE: Culture: NO GROWTH

## 2018-06-19 NOTE — Progress Notes (Signed)
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  She had a persistent air leak postoperatively and required IBV placement.  Those were removed on 06/13/2018.  Cheryl lung was completely atelectatic prior to the removal.  In the PACU there was some improved aeration but still a lot of atelectasis.  On Cheryl chest x-ray the following day there was an air-fluid level.  She had thoracentesis.  There was a pneumo ex vacuo after thoracentesis.  Cheryl cytologies and cultures came back negative.  She returns today for scheduled follow-up visit.  She denies any fevers or chills.  She has an occasional cough with clear mucus.  She has not had any purulent sputum.  She still has some intermittent shortness of breath, but that has been trending better.  Some of Cheryl other issues like sore throat and pain have improved.  She does still have some pain where she had the thoracentesis last week.  Past Medical History:  Diagnosis Date  . Anemia   . Atherosclerosis   . GERD (gastroesophageal reflux disease)   . Hemoptysis   . Hyperlipidemia   . Hypertension   . IBS (irritable bowel syndrome)   . Lung mass   . Migraines   . Spondylosis   . Vitamin D deficiency     Current Outpatient Medications  Medication Sig Dispense Refill  . acetaminophen (TYLENOL) 160 MG/5ML solution Take 31.3 mLs (1,000 mg total) by mouth every 6 (six) hours as needed for moderate pain. 120 mL 0  . beta carotene 25000 UNIT capsule Take 25,000 Units by mouth every other day.    . Cholecalciferol (VITAMIN D3) 50 MCG (2000 UT) TABS Take 4,000 Units by mouth daily.    . Coenzyme Q10 (COQ10) 200 MG CAPS Take 200 mg by mouth daily.    Marland Kitchen EPINEPHrine (EPIPEN 2-PAK) 0.3 mg/0.3 mL IJ SOAJ injection Inject 0.3 mg into the muscle once.    .  Levomefolate Glucosamine (METHYLFOLATE PO) Place 800 mcg under the tongue every other day.    . Multiple Vitamins-Minerals (EMERGEN-C IMMUNE PO) Take 1 packet by mouth daily as needed (for immune system).    . Olopatadine HCl (PATANASE) 0.6 % SOLN Place 1-2 sprays into the nose 2 (two) times daily as needed (for congestion).     . Omega-3 1000 MG CAPS Take 2,000 mg by mouth daily.     . Probiotic Product (PROBIOTIC PO) Take 1 capsule by mouth daily.    . Riboflavin 100 MG CAPS Take 200 mg by mouth every other day.     . rizatriptan (MAXALT) 10 MG tablet Take 5 mg by mouth as needed for migraine. May repeat in 2 hours if needed     . rosuvastatin (CRESTOR) 5 MG tablet Take 5 mg by mouth daily.    . sucralfate (CARAFATE) 1 g tablet Take 1 g by mouth See admin instructions. Crush and drink 1 g by mouth 2-3 times daily    . vitamin A 8000 UNIT capsule Take 8,000 Units by mouth every other day.    . vitamin B-12 (CYANOCOBALAMIN) 1000 MCG tablet Take 1,000 mcg by mouth every other day.     No current facility-administered medications for  this visit.     Physical Exam BP (!) 170/100 (BP Location: Left Arm, Patient Position: Sitting, Cuff Size: Normal)   Pulse 99   Resp 16   Ht 5' 4.5" (1.638 m)   Wt 146 lb (66.2 kg)   SpO2 97% Comment: RA  BMI 24.31 kg/m  68 year old woman in no acute distress Alert and oriented x3 with no focal deficits Diminished breath sounds on left side Incisions well-healed Cardiac regular rate and rhythm  Diagnostic Tests: CHEST - 2 VIEW  COMPARISON:  Chest x-ray 06/14/2018 and chest CT 05/23/2018  FINDINGS: The cardiac silhouette, mediastinal and hilar contours are within normal limits and stable. Stable surgical changes in the left hilum. There is a persistent left-sided hydropneumothorax. Slightly less fluid but larger pneumothorax estimated at 40%.  The right lung remains clear.  IMPRESSION: Persistent left-sided hydropneumothorax. The pneumothorax  is estimated at 40%. 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   Electronically Signed   By: Marijo Sanes M.D.   On: 06/19/2018 10:39  I personally reviewed the chest x-ray images and concur with the findings noted above  Impression: I had a long discussion with [name] and [name].  We reviewed the chest x-rays since the IBV removal.  Today's x-ray shows no recurrence of Cheryl effusion but an increase in the size of Cheryl pneumothorax.  Despite that she is not having any distress such as pain or shortness of breath.  She does not have any fevers, chills, night sweats or any evidence of subcutaneous emphysema.  I suspect she still has a small leak.  The question is whether she has reached an equilibrium where the pneumothorax will stabilize and ultimately resolved or whether we need to more aggressively intervene.  Currently she looks great and is feeling better than she has since the surgery.  I do not see any indication for intervention currently, but she does need close follow-up.  We discussed the possibility of readmission to the hospital for observation versus returning to the office tomorrow for follow-up chest x-ray.  That is complicated by the fact that she lives in [place].  On the other hand we would not be doing anything for Cheryl in the hospital other than waiting for a chest x-ray tomorrow.  After discussion they are again to return home today.  They will return to the office tomorrow and get another PA and lateral chest x-ray.  If she develops any increasing pain, shortness of breath, subcutaneous emphysema, or any other symptoms of concern she will go to the nearest emergency room.  If necessary they could place a tube there and then transfer Cheryl here for further management.  As noted in HPI, cytologies and cultures were both negative. Plan: Return to office tomorrow with PA and lateral chest x-ray.  [name], MD [facility] [phone]

## 2018-06-20 ENCOUNTER — Other Ambulatory Visit: Payer: Self-pay

## 2018-06-20 ENCOUNTER — Telehealth: Payer: Self-pay | Admitting: *Deleted

## 2018-06-20 ENCOUNTER — Ambulatory Visit (INDEPENDENT_AMBULATORY_CARE_PROVIDER_SITE_OTHER): Payer: Self-pay | Admitting: Thoracic Surgery (Cardiothoracic Vascular Surgery)

## 2018-06-20 ENCOUNTER — Ambulatory Visit
Admission: RE | Admit: 2018-06-20 | Discharge: 2018-06-20 | Disposition: A | Discharge status: Home / Self Care | Payer: Medicare Other | Source: Ambulatory Visit | Attending: Thoracic Surgery (Cardiothoracic Vascular Surgery) | Admitting: Thoracic Surgery (Cardiothoracic Vascular Surgery)

## 2018-06-20 ENCOUNTER — Encounter: Payer: Self-pay | Admitting: Thoracic Surgery (Cardiothoracic Vascular Surgery)

## 2018-06-20 VITALS — BP 116/80 | HR 96 | Resp 16 | Ht 64.5 in | Wt 146.0 lb

## 2018-06-20 DIAGNOSIS — Z902 Acquired absence of lung [part of]: Secondary | ICD-10-CM

## 2018-06-20 DIAGNOSIS — J948 Other specified pleural conditions: Secondary | ICD-10-CM

## 2018-06-20 DIAGNOSIS — C3492 Malignant neoplasm of unspecified part of left bronchus or lung: Secondary | ICD-10-CM

## 2018-06-20 DIAGNOSIS — C3490 Malignant neoplasm of unspecified part of unspecified bronchus or lung: Secondary | ICD-10-CM

## 2018-06-20 NOTE — Telephone Encounter (Addendum)
Results received from the [facility] showing no mutations for PD-L1 or ALK.   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  Results given to Dr. [name] and he instructed to schedule patient to be seen in clinic to discuss results and possible adjuvant treatment along with this clinical trial with patient.  Per Dr. [name], scheduling message sent to schedule patient for this Friday 3/6 at 10:45 am.  Called patient and LVM informing her of results received and appointment with Dr. [name] on Friday.  Asked her to call research nurse back for more details and also to confirm this appointment. [name], BSN, RN Clinical Research Nurse 06/20/2018 10:58 AM   Patient returned call after seeing Dr. Roxan Hockey today.  She confirmed appointment with Dr. [name] on Friday at 10:45 am.  She will have a CXR ordered by Dr. Roxan Hockey same morning before she sees Dr. [name].  Patient understands she is eligible for a treatment study based on her tumor genotyping results and that Dr. [name] and research nurse will give her more information about this study on Friday.  [name], BSN, RN Clinical Research Nurse 06/20/2018 1:37 PM

## 2018-06-20 NOTE — Progress Notes (Signed)
[address]       [place] [address]             [phone]      HPI: [name] returns for scheduled follow-up  [name] is a 68 year old woman who had a thoracoscopic left lower lobectomy for stage IIa adenocarcinoma on 04/25/2018.  She had a persistent air leak postoperatively.  Ultimately IBV's were placed.  The IBV's were removed on 06/13/2018.  She had atelectasis of the left upper lobe prior to removal of the IBV's.  On the immediate follow-up in the PACU there was some aeration.  On her chest x-ray the next day there was was an air-fluid level.  She had a thoracentesis which drained 750 mL of fluid.  Cytology and cultures were negative. On the x-ray following her thoracentesis she had a pneumo ex vacuo at the base  She returned to the office yesterday.  She was feeling better although still tired overall.  She was having some pain at the thoracentesis site.  Her chest x-ray showed hydropneumothorax.  The pneumothorax was approximately 40% per radiology.  She was completely stable clinically so we allowed her to go home to [place].  She returned today for follow-up x-ray.  She did not have any problems overnight.  Past Medical History:  Diagnosis Date  . Anemia   . Atherosclerosis   . GERD (gastroesophageal reflux disease)   . Hemoptysis   . Hyperlipidemia   . Hypertension   . IBS (irritable bowel syndrome)   . Lung mass   . Migraines   . Spondylosis   . Vitamin D deficiency     Current Outpatient Medications  Medication Sig Dispense Refill  . acetaminophen (TYLENOL) 160 MG/5ML solution Take 31.3 mLs (1,000 mg total) by mouth every 6 (six) hours as needed for moderate pain. 120 mL 0  . beta carotene 25000 UNIT capsule Take 25,000 Units by mouth every other day.    . Cholecalciferol (VITAMIN D3) 50 MCG (2000 UT) TABS Take 4,000 Units by mouth daily.    . Coenzyme Q10 (COQ10) 200 MG CAPS Take 200 mg by mouth daily.    Marland Kitchen EPINEPHrine (EPIPEN  2-PAK) 0.3 mg/0.3 mL IJ SOAJ injection Inject 0.3 mg into the muscle once.    . Levomefolate Glucosamine (METHYLFOLATE PO) Place 800 mcg under the tongue every other day.    . Multiple Vitamins-Minerals (EMERGEN-C IMMUNE PO) Take 1 packet by mouth daily as needed (for immune system).    . Olopatadine HCl (PATANASE) 0.6 % SOLN Place 1-2 sprays into the nose 2 (two) times daily as needed (for congestion).     . Omega-3 1000 MG CAPS Take 2,000 mg by mouth daily.     . Probiotic Product (PROBIOTIC PO) Take 1 capsule by mouth daily.    . Riboflavin 100 MG CAPS Take 200 mg by mouth every other day.     . rizatriptan (MAXALT) 10 MG tablet Take 5 mg by mouth as needed for migraine. May repeat in 2 hours if needed     . rosuvastatin (CRESTOR) 5 MG tablet Take 5 mg by mouth daily.    . sucralfate (CARAFATE) 1 g tablet Take 1 g by mouth See admin instructions. Crush and drink 1 g by mouth 2-3 times daily    . vitamin A 8000 UNIT capsule Take 8,000 Units by mouth every other day.    . vitamin B-12 (CYANOCOBALAMIN) 1000 MCG tablet Take 1,000 mcg by  mouth every other day.     No current facility-administered medications for this visit.     Physical Exam BP 116/80 (BP Location: Left Arm, Patient Position: Sitting, Cuff Size: Normal)   Pulse 96 Comment: ON RA  Resp 16   Ht 5' 4.5" (1.638 m)   Wt 146 lb (66.2 kg)   SpO2 96% Comment: RA  BMI 24.73 kg/m  68 year old woman in no acute distress Alert and oriented x3 with no focal deficits Lungs diminished breath sounds on left Cardiac regular rate and rhythm  Diagnostic Tests: CHEST - 2 VIEW  COMPARISON:  Chest x-rays dated 06/19/2018 and 06/14/2018 and chest CT dated 05/23/2018  FINDINGS: The left hydropneumothorax is essentially unchanged since the prior study, approximately 40%. No evidence of tension.  Right lung is clear. No right effusion. Heart size and vascularity are normal. Surgical staples adjacent to the aortic arch.  Aortic  atherosclerosis.  IMPRESSION: No change in the approximately 40% left hydropneumothorax since the study of 06/19/2018.   Electronically Signed   By: Lorriane Shire M.D.   On: 06/20/2018 11:19 I personally reviewed the chest x-ray images and concur with the findings noted above  Impression: [name] is a 68 year old woman who had a left lower lobectomy for a stage IIa adenocarcinoma.  She had a complicated postoperative course with a prolonged air leak requiring IBV placement.  After those were removed she required a thoracentesis for an effusion.  She developed a pneumo ex vacuo after that.  On her chest x-ray yesterday she had about a 40% pneumothorax with a small layering effusion.  We debated whether to admit her to the hospital, but she wanted to go home.  She has not had any issues overnight and the chest x-ray today is stable.  Based on her stable clinical status with no fevers, chills, sweats, cough I think the likelihood of an "active" bronchopleural fistula is low.  I do not think there is any indication for tube placement or more aggressive intervention at this time.  She is scheduled to meet with Dr.Mohamed on Friday to discuss enrollment in a clinical trial as her tumor was EGFR positive.  I will have her come by the office and get a chest x-ray on Friday as well.  She does know to let us know immediately if she develops fevers, chills or productive cough.  Plan: Follow-up with PA and lateral chest x-ray on Friday, 06/22/2018  [name], MD [facility] [phone]

## 2018-06-21 ENCOUNTER — Other Ambulatory Visit: Payer: Self-pay | Admitting: Thoracic Surgery (Cardiothoracic Vascular Surgery)

## 2018-06-21 DIAGNOSIS — C3490 Malignant neoplasm of unspecified part of unspecified bronchus or lung: Secondary | ICD-10-CM

## 2018-06-22 ENCOUNTER — Ambulatory Visit (INDEPENDENT_AMBULATORY_CARE_PROVIDER_SITE_OTHER): Payer: Medicare Other | Admitting: Thoracic Surgery (Cardiothoracic Vascular Surgery)

## 2018-06-22 ENCOUNTER — Encounter: Payer: Self-pay | Admitting: *Deleted

## 2018-06-22 ENCOUNTER — Inpatient Hospital Stay: Payer: Medicare Other | Attending: Internal Medicine | Admitting: Internal Medicine

## 2018-06-22 ENCOUNTER — Encounter: Payer: Self-pay | Admitting: Internal Medicine

## 2018-06-22 ENCOUNTER — Ambulatory Visit
Admission: RE | Admit: 2018-06-22 | Discharge: 2018-06-22 | Disposition: A | Discharge status: Home / Self Care | Payer: Medicare Other | Source: Ambulatory Visit | Attending: Thoracic Surgery (Cardiothoracic Vascular Surgery) | Admitting: Thoracic Surgery (Cardiothoracic Vascular Surgery)

## 2018-06-22 VITALS — BP 140/82 | HR 93 | Temp 98.4°F | Resp 18 | Ht 64.5 in | Wt 139.8 lb

## 2018-06-22 VITALS — BP 150/84 | HR 92 | Resp 20 | Ht 64.5 in | Wt 146.0 lb

## 2018-06-22 DIAGNOSIS — I10 Essential (primary) hypertension: Secondary | ICD-10-CM | POA: Insufficient documentation

## 2018-06-22 DIAGNOSIS — Z5111 Encounter for antineoplastic chemotherapy: Secondary | ICD-10-CM

## 2018-06-22 DIAGNOSIS — C3490 Malignant neoplasm of unspecified part of unspecified bronchus or lung: Secondary | ICD-10-CM

## 2018-06-22 DIAGNOSIS — C3432 Malignant neoplasm of lower lobe, left bronchus or lung: Secondary | ICD-10-CM | POA: Diagnosis present

## 2018-06-22 DIAGNOSIS — Z7189 Other specified counseling: Secondary | ICD-10-CM

## 2018-06-22 DIAGNOSIS — J948 Other specified pleural conditions: Secondary | ICD-10-CM

## 2018-06-22 NOTE — Progress Notes (Signed)
[place] [phone]   [phone]  OFFICE PROGRESS NOTE  Donald Prose, MD [place] [address]  DIAGNOSIS: Stage IIA (T2b, N0, M0) non-small cell lung cancer, adenocarcinoma diagnosed in November 2019  Molecular studies showed EGFR mutation in exon 21 (L858R) PRIOR THERAPY: status post left lower lobectomy with lymph node dissection on April 25, 2018 under the care of Dr. Roxan Hockey.  CURRENT THERAPY: Observation.  INTERVAL HISTORY: [name] 68 y.o. female returns to the clinic today for follow-up visit accompanied by her husband.  The patient is feeling fine today with no concerning complaints.  She still undergoing management for the air leak under the care of Dr. Roxan Hockey.  She denied having any current chest pain, shortness of breath, cough or hemoptysis.  She denied having any fever or chills she has no nausea, vomiting, diarrhea or constipation.  She denied having any headache or visual changes.  The patient underwent molecular studies by the alchemist trial and it showed positive EGFR mutation in exon 21 (L858R).  The patient is here today for evaluation and discussion of her treatment options based on the new findings.  MEDICAL HISTORY: Past Medical History:  Diagnosis Date  . Anemia   . Atherosclerosis   . GERD (gastroesophageal reflux disease)   . Hemoptysis   . Hyperlipidemia   . Hypertension   . IBS (irritable bowel syndrome)   . Lung mass   . Migraines   . Spondylosis   . Vitamin D deficiency     ALLERGIES:  is allergic to avocado; influenza vaccines; nexium [esomeprazole magnesium]; penicillins; pork-derived products; proton pump inhibitors; sulfites; wasp venom protein; zantac [ranitidine hcl]; cefaclor; chlorhexidine; chloroxine; ciprofloxacin hcl; clarithromycin; gatifloxacin; other; sulfa antibiotics; sulfamethoxazole-trimethoprim; aspartame; chocolate; dilaudid [hydromorphone hcl];  fentanyl; ivp dye [iodinated diagnostic agents]; ketorolac; lactase; milk-related compounds; molds & smuts; oxycodone; prednisolone; shellfish allergy; and soap.  MEDICATIONS:  Current Outpatient Medications  Medication Sig Dispense Refill  . beta carotene 25000 UNIT capsule Take 25,000 Units by mouth every other day.    . Cholecalciferol (VITAMIN D3) 50 MCG (2000 UT) TABS Take 4,000 Units by mouth daily.    . Coenzyme Q10 (COQ10) 200 MG CAPS Take 200 mg by mouth daily.    . Levomefolate Glucosamine (METHYLFOLATE PO) Place 800 mcg under the tongue every other day.    . Omega-3 1000 MG CAPS Take 2,000 mg by mouth daily.     . rosuvastatin (CRESTOR) 5 MG tablet Take 5 mg by mouth daily.    . sucralfate (CARAFATE) 1 g tablet Take 1 g by mouth See admin instructions. Crush and drink 1 g by mouth 2-3 times daily    . vitamin B-12 (CYANOCOBALAMIN) 1000 MCG tablet Take 1,000 mcg by mouth every other day.    Marland Kitchen acetaminophen (TYLENOL) 160 MG/5ML solution Take 31.3 mLs (1,000 mg total) by mouth every 6 (six) hours as needed for moderate pain. (Patient not taking: Reported on 06/22/2018) 120 mL 0  . EPINEPHrine (EPIPEN 2-PAK) 0.3 mg/0.3 mL IJ SOAJ injection Inject 0.3 mg into the muscle once.    . Multiple Vitamins-Minerals (EMERGEN-C IMMUNE PO) Take 1 packet by mouth daily as needed (for immune system).    . Olopatadine HCl (PATANASE) 0.6 % SOLN Place 1-2 sprays into the nose 2 (two) times daily as needed (for congestion).     . Probiotic Product (PROBIOTIC PO) Take 1 capsule by mouth daily.    . Riboflavin  100 MG CAPS Take 200 mg by mouth every other day.     . rizatriptan (MAXALT) 10 MG tablet Take 5 mg by mouth as needed for migraine. May repeat in 2 hours if needed     . vitamin A 8000 UNIT capsule Take 8,000 Units by mouth every other day.     No current facility-administered medications for this visit.     SURGICAL HISTORY:  Past Surgical History:  Procedure Laterality Date  . BROW LIFT AND  BLEPHAROPLASTY  03/15/2013  . CATARACT EXTRACTION, BILATERAL  06/23/2014  . CHEST TUBE INSERTION Left 05/10/2018   Procedure: CHEST TUBE INSERTION;  Surgeon: [name], MD;  Location: [facility];  Service: Thoracic;  Laterality: Left;  . COLONOSCOPY  05/2014  . ESOPHAGOGASTRODUODENOSCOPY  2019  . EYE SURGERY    . IR THORACENTESIS ASP PLEURAL SPACE W/IMG GUIDE  05/24/2018  . IR THORACENTESIS ASP PLEURAL SPACE W/IMG GUIDE  06/14/2018  . TONSILLECTOMY    . VIDEO ASSISTED THORACOSCOPY (VATS)/ LOBECTOMY Left 04/25/2018   Procedure: VIDEO ASSISTED THORACOSCOPY (VATS)LEFT LOWER LOBECTOMY, NODE DISSECTION;  Surgeon: [name], MD;  Location: [facility];  Service: Thoracic;  Laterality: Left;  Marland Kitchen VIDEO BRONCHOSCOPY WITH ENDOBRONCHIAL NAVIGATION N/A 03/07/2018   Procedure: VIDEO BRONCHOSCOPY WITH ENDOBRONCHIAL NAVIGATION;  Surgeon: Collene Gobble, MD;  Location: [facility] OR;  Service: Thoracic;  Laterality: N/A;  . VIDEO BRONCHOSCOPY WITH INSERTION OF INTERBRONCHIAL VALVE (IBV) N/A 05/10/2018   Procedure: VIDEO BRONCHOSCOPY WITH INSERTION OF INTERBRONCHIAL VALVE (IBV);  Surgeon: [name], MD;  Location: [facility] OR;  Service: Thoracic;  Laterality: N/A;  . VIDEO BRONCHOSCOPY WITH INSERTION OF INTERBRONCHIAL VALVE (IBV) N/A 06/13/2018   Procedure: VIDEO BRONCHOSCOPY WITH REMOVAL OF INTERBRONCHIAL VALVE (IBV) x 2;  Surgeon: [name], MD;  Location: [facility];  Service: Thoracic;  Laterality: N/A;    REVIEW OF SYSTEMS:  Constitutional: negative Eyes: negative Ears, nose, mouth, throat, and face: negative Respiratory: negative Cardiovascular: negative Gastrointestinal: negative Genitourinary:negative Integument/breast: negative Hematologic/lymphatic: negative Musculoskeletal:negative Neurological: negative Behavioral/Psych: negative Endocrine: negative Allergic/Immunologic: negative   PHYSICAL EXAMINATION: General appearance: alert, cooperative, fatigued and no distress Head:  Normocephalic, without obvious abnormality, atraumatic Neck: no adenopathy, no JVD, supple, symmetrical, trachea midline and thyroid not enlarged, symmetric, no tenderness/mass/nodules Lymph nodes: Cervical, supraclavicular, and axillary nodes normal. Resp: clear to auscultation bilaterally Back: symmetric, no curvature. ROM normal. No CVA tenderness. Cardio: regular rate and rhythm, S1, S2 normal, no murmur, click, rub or gallop GI: soft, non-tender; bowel sounds normal; no masses,  no organomegaly Extremities: extremities normal, atraumatic, no cyanosis or edema Neurologic: Alert and oriented X 3, normal strength and tone. Normal symmetric reflexes. Normal coordination and gait  ECOG PERFORMANCE STATUS: 1 - Symptomatic but completely ambulatory  Blood pressure 140/82, pulse 93, temperature 99 F (37.2 C), temperature source Oral, resp. rate 18, height 5' 4.5" (1.638 m), weight 139 lb 12.8 oz (63.4 kg), SpO2 98 %.  LABORATORY DATA: Lab Results  Component Value Date   WBC 9.2 06/12/2018   HGB 11.6 (L) 06/12/2018   HCT 37.9 06/12/2018   MCV 98.4 06/12/2018   PLT 351 06/12/2018      Chemistry      Component Value Date/Time   NA 137 06/12/2018 1450   K 3.9 06/12/2018 1450   CL 105 06/12/2018 1450   CO2 27 06/12/2018 1450   BUN 17 06/12/2018 1450   CREATININE 0.76 06/12/2018 1450   CREATININE 0.78 05/31/2018 1311  Component Value Date/Time   CALCIUM 9.1 06/12/2018 1450   ALKPHOS 63 06/12/2018 1450   AST 22 06/12/2018 1450   AST 19 05/31/2018 1311   ALT 17 06/12/2018 1450   ALT 15 05/31/2018 1311   BILITOT 0.8 06/12/2018 1450   BILITOT 0.3 05/31/2018 1311       RADIOGRAPHIC STUDIES: Dg Chest 1 View  Result Date: 06/14/2018 CLINICAL DATA:  Post left-sided thoracentesis. History left lower and partial left upper lobectomy complicated by air leak requiring placement of bronchial valves, recently removed. EXAM: CHEST  1 VIEW COMPARISON:  06/14/2018 FINDINGS: Unchanged  cardiac silhouette and mediastinal contours. Interval reduction/near resolution of left-sided pleural effusion post thoracentesis with no change to slight reduction in apical component of left-sided pneumothorax. There is incomplete expansion of the left lower lung presumably secondary to ex-vacuo phenomena. No midline shift. Improved aeration of the left lung with persistent bilateral infrahilar opacities favored to represent atelectasis. No right-sided pleural effusion or pneumothorax. No evidence of edema. No acute osseus abnormalities. IMPRESSION: 1. Interval reduction/near resolution of left-sided effusion post thoracentesis. 2. No change to slight reduction in apical component of left-sided pneumothorax. 3. Incomplete expansion of the left lower lung presumably secondary to ex vacuo phenomenon. No midline shift Electronically Signed   By: Sandi Mariscal M.D.   On: 06/14/2018 15:16   Dg Chest 1 View  Result Date: 05/24/2018 CLINICAL DATA:  Pleural effusion. S/P thoracentesis on left side. EXAM: CHEST  1 VIEW COMPARISON:  To 09/05/2018, 05/16/2018 FINDINGS: There is postoperative volume loss on the LEFT. Persistent subcutaneous gas is identified in the LEFT chest wall. The LEFT pneumothorax is no longer apparent. The RIGHT lung remains clear. IMPRESSION: No pneumothorax. Postoperative changes in the LEFT lung. Electronically Signed   By: Nolon Nations M.D.   On: 05/24/2018 10:57   Dg Chest 2 View  Result Date: 06/22/2018 CLINICAL DATA:  Adenocarcinoma of lung.  Follow-up pneumothorax EXAM: CHEST - 2 VIEW COMPARISON:  06/20/2018 FINDINGS: Moderately large left pneumothorax is slightly improved. Pneumothorax is present from the apex to the base laterally and anteriorly. Hydropneumothorax with air-fluid level in the base unchanged. Post surgical staples adjacent to the aortic arch. Right lung remains clear.  Negative for heart failure or pneumonia. IMPRESSION: Moderately large left pneumothorax is slightly  improved. Left pleural fluid unchanged. Electronically Signed   By: Franchot Gallo M.D.   On: 06/22/2018 09:14   Dg Chest 2 View  Result Date: 06/20/2018 CLINICAL DATA:  Left hydropneumothorax. VATS procedure on 04/25/2018. Adenocarcinoma of the lung. EXAM: CHEST - 2 VIEW COMPARISON:  Chest x-rays dated 06/19/2018 and 06/14/2018 and chest CT dated 05/23/2018 FINDINGS: The left hydropneumothorax is essentially unchanged since the prior study, approximately 40%. No evidence of tension. Right lung is clear. No right effusion. Heart size and vascularity are normal. Surgical staples adjacent to the aortic arch. Aortic atherosclerosis. IMPRESSION: No change in the approximately 40% left hydropneumothorax since the study of 06/19/2018. Electronically Signed   By: Lorriane Shire M.D.   On: 06/20/2018 11:19   Dg Chest 2 View  Result Date: 06/19/2018 CLINICAL DATA:  Status post left lower lobe lobectomy. Followup hydropneumothorax. EXAM: CHEST - 2 VIEW COMPARISON:  Chest x-ray 06/14/2018 and chest CT 05/23/2018 FINDINGS: The cardiac silhouette, mediastinal and hilar contours are within normal limits and stable. Stable surgical changes in the left hilum. There is a persistent left-sided hydropneumothorax. Slightly less fluid but larger pneumothorax estimated at 40%. The right lung remains clear. IMPRESSION: Persistent left-sided  hydropneumothorax. The pneumothorax is estimated at 40%. 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 Electronically Signed   By: Marijo Sanes M.D.   On: 06/19/2018 10:39   Dg Chest 2 View  Result Date: 06/14/2018 CLINICAL DATA:  History of left lower lobectomy and partial left upper lobectomy for lung adenocarcinoma on 53/61/4431, complicated by air leak with left bronchial valve placement and recent removal. Follow-up. EXAM: CHEST - 2 VIEW COMPARISON:  Chest radiograph from one day prior. FINDINGS: Stable cardiomediastinal silhouette with normal heart size. There is  a new moderate left hydropneumothorax with new apical pneumothorax component and new air-fluid level in the left mid chest. No mediastinal shift. No right pneumothorax. No right pleural effusion. Clear right lung. Left perihilar and basilar opacity, favor atelectasis, decreased. IMPRESSION: 1. New moderate left hydropneumothorax with air-fluid level in the left mid chest. No mediastinal shift. 2. Left perihilar and basilar lung opacity, favor atelectasis, decreased. Critical Value/emergent results were called by telephone at the time of interpretation on 06/14/2018 at 11:51 am to [place] , who verbally acknowledged these results. Electronically Signed   By: Ilona Sorrel M.D.   On: 06/14/2018 11:58   Dg Chest 2 View  Result Date: 06/13/2018 CLINICAL DATA:  Shortness of breath. EXAM: CHEST - 2 VIEW COMPARISON:  Radiographs of May 31, 2018. FINDINGS: There is complete opacification of the left hemithorax most likely due to atelectasis given the significant amount of mediastinal shift seen from right to left. Some degree of pleural effusion can not be excluded. Right lung is hyperexpanded but otherwise normal. No pneumothorax is noted. Bony thorax is unremarkable. IMPRESSION: Complete opacification of left hemithorax is noted with significant right to left mediastinal shift most consistent with atelectasis and possibly some degree of pleural effusion. Electronically Signed   By: Marijo Conception, M.D.   On: 06/13/2018 09:57   Dg Chest 2 View  Result Date: 05/31/2018 CLINICAL DATA:  Cough and shortness of breath. EXAM: CHEST - 2 VIEW COMPARISON:  2/6 and 05/25/2018 FINDINGS: Stable postoperative changes involving the left hemithorax. There also endobronchial valves again noted. Interval opacification of the left hemithorax and associated loss of volume likely due to atelectasis. Small amount of residual aerated left upper lobe. Persistent but improved subcutaneous emphysema without definite  pneumothorax. The right lung remains hyperinflated but clear. IMPRESSION: Progressive volume loss in the left hemithorax with progressive opacification likely due to bronchial obstruction/atelectasis. Resolving subcutaneous emphysema. Electronically Signed   By: Marijo Sanes M.D.   On: 05/31/2018 13:33   Dg Chest 2 View  Result Date: 05/25/2018 CLINICAL DATA:  Status post thoracotomy. Placement of intrabronchial valves. EXAM: CHEST - 2 VIEW COMPARISON:  Chest x-ray 05/24/2018 FINDINGS: Stable surgical changes involving the left hemithorax with significant loss of volume. Endobronchial valves are noted near the left hilum. Persistent left basilar density likely a combination of residual pleural fluid and atelectasis. Stable subcutaneous emphysema but no definite pneumothorax. The right lung is clear.  Stable hyperexpansion. IMPRESSION: 1. Persistent left basilar process, likely combination of residual fluid and atelectasis. 2. Stable subcutaneous emphysema but no definite pneumothorax. 3. Endobronchial valves are again noted in the left hilum. Electronically Signed   By: Marijo Sanes M.D.   On: 05/25/2018 08:23   Ct Chest Wo Contrast  Result Date: 05/23/2018 CLINICAL DATA:  Persistent air leak with extensive subcutaneous emphysema. EXAM: CT CHEST WITHOUT CONTRAST TECHNIQUE: Multidetector CT imaging of the chest was performed following the standard protocol without IV  contrast. COMPARISON:  03/01/2018 FINDINGS: Cardiovascular: Evaluation of vascular structures is limited without IV contrast material. Heart size is normal. Small pericardial effusions. Normal caliber thoracic aorta. Scattered aortic and coronary artery calcifications. Mediastinum/Nodes: Esophagus is decompressed. Small amount of gas in the left anterior mediastinal fat. No significant lymphadenopathy. Lungs/Pleura: Postoperative partial left lung resection. Moderate-sized left pleural effusion with basilar atelectasis. Opacification of left  lower lobe bronchi may represent secretions. Right lung is clear and expanded. No pneumothorax. Upper Abdomen: No acute abnormalities identified. Musculoskeletal: There is prominent subcutaneous emphysema in the left chest wall anteriorly with extension to the left axilla and left shoulder as well as the left flank. Bones appear intact. Normal alignment of the thoracic spine. No acute displaced rib fractures identified. IMPRESSION: 1. Postoperative partial left lung resection. Moderate-sized left pleural effusion with basilar atelectasis. Opacification of left lower lobe bronchi may represent secretions. 2. Prominent subcutaneous emphysema in the left chest wall, left axilla, and left flank. Small amount of gas in the left anterior mediastinal fat. 3. Small pericardial effusion. Electronically Signed   By: Lucienne Capers M.D.   On: 05/23/2018 23:06   Dg Chest Port 1 View  Result Date: 06/13/2018 CLINICAL DATA:  Status post bronchoscopy. History of air leak and endobronchial valves. EXAM: PORTABLE CHEST 1 VIEW COMPARISON:  06/12/2018 FINDINGS: The endobronchial valves have been removed. Improved aeration in left lung but there continues to be marked volume loss and densities throughout the left hemithorax. Mediastinal shift towards the left is stable. Right lung remains clear. Postsurgical changes in the left chest. Persistent lucency at the left lung apex but no large pneumothorax. IMPRESSION: Slightly improved aeration in the left lung. There continues to be extensive volume loss and densities throughout the left hemithorax. Probable left pleural effusion. Removal of endobronchial valves. Lucency at the left lung apex is indeterminate. No large pneumothorax. Electronically Signed   By: Markus Daft M.D.   On: 06/13/2018 09:56   Ir Thoracentesis Asp Pleural Space W/img Guide  Result Date: 06/14/2018 INDICATION: Patient with history of left lung adenocarcinoma, status post prior left lower lobectomy and  partial left upper lobectomy with subsequent air-leak, bronchial valve placements and removal yesterday. Now with left hydropneumothorax. Request received for diagnostic and therapeutic left thoracentesis. EXAM: ULTRASOUND GUIDED DIAGNOSTIC AND THERAPEUTIC LEFT THORACENTESIS MEDICATIONS: None COMPLICATIONS: None immediate. PROCEDURE: An ultrasound guided thoracentesis was thoroughly discussed with the patient and questions answered. The benefits, risks, alternatives and complications were also discussed. The patient understands and wishes to proceed with the procedure. Written consent was obtained. Ultrasound was performed to localize and mark an adequate pocket of fluid in the left chest. The area was then prepped and draped in the normal sterile fashion. 1% Lidocaine was used for local anesthesia. Under ultrasound guidance a 6 Fr Safe-T-Centesis catheter was introduced. Thoracentesis was performed. The catheter was removed and a dressing applied. FINDINGS: A total of approximately 750 cc of slightly hazy, yellow fluid was removed. Samples were sent to the laboratory as requested by the clinical team. IMPRESSION: Successful ultrasound guided diagnostic and therapeutic left thoracentesis yielding 750 cc of pleural fluid. Read by: Rowe Robert, PA-C Electronically Signed   By: Sandi Mariscal M.D.   On: 06/14/2018 15:01   Ir Thoracentesis Asp Pleural Space W/img Guide  Result Date: 05/25/2018 INDICATION: 68 year old female with left-sided pleural fluid EXAM: ULTRASOUND GUIDED LEFT THORACENTESIS MEDICATIONS: None. COMPLICATIONS: None PROCEDURE: An ultrasound guided thoracentesis was thoroughly discussed with the patient and questions answered. The benefits, risks,  alternatives and complications were also discussed. The patient understands and wishes to proceed with the procedure. Written consent was obtained. Ultrasound was performed to localize and mark an adequate pocket of fluid in the left chest. The area was then  prepped and draped in the normal sterile fashion. 1% Lidocaine was used for local anesthesia. Under ultrasound guidance a 8 Fr Safe-T-Centesis catheter was introduced. Thoracentesis was performed. The catheter was removed and a dressing applied. FINDINGS: A total of approximately 475 cc of hazy dark amber fluid was removed. Samples were sent to the laboratory as requested by the clinical team. IMPRESSION: Status post ultrasound-guided left thoracentesis, performed by VIR PA. Electronically Signed   By: Corrie Mckusick D.O.   On: 05/25/2018 08:14    ASSESSMENT AND PLAN: This is a very pleasant 68 years old white female with a stage IIa non-small cell lung cancer, adenocarcinoma with positive EGFR mutation in exon 21 (L858R) status post left lower lobectomy with lymph node dissection under the care of Dr. Roxan Hockey. The patient is feeling fine today. We had a lengthy discussion that lasted for over 45 minutes about her treatment options as well as the results of the molecular studies. I discussed with the patient 3 options for management of her condition including continuous observation and close monitoring versus consideration of adjuvant systemic chemotherapy with cisplatin and Alimta for 4 cycles either alone or followed by enrollment in the clinical trial for treatment with Tarceva versus placebo for 2 years versus enrollment in the alliance clinical trial with randomized treatment with Tarceva 150 mg p.o. daily for 2 years vs. placebo. The patient and her husband had a lot of questions about the 3 treatment options as well as the prognosis with and without each 1 of them. By the end of the meeting the patient and her husband did not reach any conclusive decision about which option she would like.  It feels like the patient is not interested in the adjuvant chemotherapy because of the adverse effects. She still debating between just observation versus enrollment in the clinical trial for Tarceva versus  placebo. She will be seen by the clinical research nurse again today for more detailed discussion of the clinical trial. The patient will call with her decision hopefully in the next few days. As she decided to continue on observation, I will see her back for follow-up visit with repeat CT scan of the chest and around 4 months.  Then every 6 months after that. The patient was advised to call immediately if she has any concerning symptoms in the interval. The patient voices understanding of current disease status and treatment options and is in agreement with the current care plan.  All questions were answered. The patient knows to call the clinic with any problems, questions or concerns. We can certainly see the patient much sooner if necessary.  I spent 40 minutes counseling the patient face to face. The total time spent in the appointment was 45 minutes.  Disclaimer: This note was dictated with voice recognition software. Similar sounding words can inadvertently be transcribed and may not be corrected upon review.

## 2018-06-22 NOTE — Progress Notes (Signed)
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  She had a persistent air leak postoperatively ultimately treated with intrabronchial valves.  Those were removed on 06/13/2018.  Following removal of the valve she had a hydropneumothorax.  750 mL of fluid was evacuated.  She had a pneumo ex vacuo on the postthoracentesis chest x-ray.  I saw her back in the office on 06/19/2018.  There was about a 40% pneumothorax and a small amount of fluid in the left chest.  Much of this air may have been due to the pneumo ex vacuo but it was not clear if there is been additional air leak in the interim.  However, she was doing extremely well clinically so we decided to watch that with a repeat chest x-ray the following day.  That chest x-ray was unchanged.  She now returns with a follow-up.  She was concerned because she checked her oxygen saturations when she first woke up this morning and they were less than 90.  She took some deep breaths and saturations came up and stayed up after that.  She still has some pain that her thoracentesis site does not have much pain at her incision she does not feel short of breath, but she continues to complain of a lack of energy.  She has not had any unusual cough, fevers or chills.  Past Medical History:  Diagnosis Date  . Anemia   . Atherosclerosis   . GERD (gastroesophageal reflux disease)   . Hemoptysis   . Hyperlipidemia   . Hypertension   . IBS (irritable bowel syndrome)   . Lung mass   . Migraines   . Spondylosis   . Vitamin D deficiency      Current Outpatient Medications  Medication Sig Dispense Refill  . acetaminophen (TYLENOL) 160 MG/5ML solution Take 31.3 mLs (1,000 mg total) by mouth every 6 (six) hours as needed for moderate  pain. (Patient not taking: Reported on 06/22/2018) 120 mL 0  . beta carotene 25000 UNIT capsule Take 25,000 Units by mouth every other day.    . Cholecalciferol (VITAMIN D3) 50 MCG (2000 UT) TABS Take 4,000 Units by mouth daily.    . Coenzyme Q10 (COQ10) 200 MG CAPS Take 200 mg by mouth daily.    Marland Kitchen EPINEPHrine (EPIPEN 2-PAK) 0.3 mg/0.3 mL IJ SOAJ injection Inject 0.3 mg into the muscle once.    . Levomefolate Glucosamine (METHYLFOLATE PO) Place 800 mcg under the tongue every other day.    . Multiple Vitamins-Minerals (EMERGEN-C IMMUNE PO) Take 1 packet by mouth daily as needed (for immune system).    . Olopatadine HCl (PATANASE) 0.6 % SOLN Place 1-2 sprays into the nose 2 (two) times daily as needed (for congestion).     . Omega-3 1000 MG CAPS Take 2,000 mg by mouth daily.     . Probiotic Product (PROBIOTIC PO) Take 1 capsule by mouth daily.    . Riboflavin 100 MG CAPS Take 200 mg by mouth every other day.     . rizatriptan (MAXALT) 10 MG tablet Take 5 mg by mouth as needed for migraine. May repeat in 2 hours if needed     .  rosuvastatin (CRESTOR) 5 MG tablet Take 5 mg by mouth daily.    . sucralfate (CARAFATE) 1 g tablet Take 1 g by mouth See admin instructions. Crush and drink 1 g by mouth 2-3 times daily    . vitamin A 8000 UNIT capsule Take 8,000 Units by mouth every other day.    . vitamin B-12 (CYANOCOBALAMIN) 1000 MCG tablet Take 1,000 mcg by mouth every other day.     No current facility-administered medications for this visit.     Physical Exam BP (!) 150/84   Pulse 92   Resp 20   Ht 5' 4.5" (1.638 m)   Wt 146 lb (66.2 kg)   SpO2 97% Comment: RA  BMI 24.5 kg/m  68 year old woman in no acute distress Alert and oriented x3 with no focal deficits Diminished breath sounds on the left side Incisions healing well  Diagnostic Tests: CHEST - 2 VIEW  COMPARISON:  06/20/2018  FINDINGS: Moderately large left pneumothorax is slightly improved. Pneumothorax is present from the  apex to the base laterally and anteriorly. Hydropneumothorax with air-fluid level in the base unchanged. Post surgical staples adjacent to the aortic arch.  Right lung remains clear.  Negative for heart failure or pneumonia.  IMPRESSION: Moderately large left pneumothorax is slightly improved. Left pleural fluid unchanged.   Electronically Signed   By: Franchot Gallo M.D.   On: 06/22/2018 09:14 I personally reviewed the chest x-ray and agree that the pneumothorax component is decreased from previous films  Impression: [name] is a 68 year old woman who is about 2 months out from a left lower lobectomy for stage IIa adenocarcinoma.  She is a lifelong non-smoker.  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  She had a prolonged air leak postoperatively and required intrabronchial valve placement.  She had those removed last week.  She had an effusion so a thoracentesis was performed.  She had a pneumo ex vacuo.  She then had a follow-up chest x-ray which showed a hydropneumothorax.  She was doing well clinically with no signs or symptoms remotely suggestive of a bronchopleural fistula and we opted to observe her right radiographically.  Now 3 days later her pneumothorax component is smaller.  There is a stable left pleural effusion.  I think this is likely a combination of a postoperative space due to a prior lobectomy along with a pneumo ex vacuo.  I do not think there is any thing to suggest a bronchopleural fistula at this point.  If there was one I think she would be ill now.  I will plan to see her again next Tuesday to repeat her chest x-ray.  She does know to call if she develops fevers, chills, productive cough, increasing pain or shortness of breath.  She has an appointment with [name] to discuss adjuvant treatment and the possibility of a clinical trial. Plan: Follow-up with Dr. [name]  I will see her back next Tuesday with a repeat PA and lateral chest x-ray  [name],  MD [facility] [phone]

## 2018-06-22 NOTE — Research (Signed)
Met with patient and her husband for 30 minutes in private exam room to discuss the [facility] [phone].  Patient understands she is eligible for this trial due to her tumor tissue testing positive for the EGFR mutation. Gave patient 2 copies of the [id number] consent form and reviewed the basic design of the study.  Explained the randomization process and both possible arms of the study.   Informed patient the deadline for her to enroll on this study is 90 days from her surgery which is April 7th.  Patient asked how long after randomization she would be expected to actually start on the study, take study drug if randomized to that arm.  Patient will be out of town on April 7 th and at least one week afterwards.  Informed patient that research nurse will find out the answer to that question and let her know.  Patient also asked how big the study drug pills are due to she has difficulty with swallowing large pills.  Informed her this research nurse does not know but will find out this information as well.   Informed patient that participation in this study is voluntary. Encouraged her to read the consent form which explains the study activities required and the risks involved in this study.  Asked patient to write down questions and she agreed to follow up with research nurse next week.  Patient says she will call research nurse on Monday and possibly plan to meet on Tuesday again. Patient said it is ok for research nurse to send MyChart message to inform her of answers to her questions.  Thanked patient very much for her time today. [name], BSN, RN Clinical Research Nurse 06/22/2018 1:17 PM

## 2018-06-25 ENCOUNTER — Other Ambulatory Visit: Payer: Self-pay | Admitting: Thoracic Surgery (Cardiothoracic Vascular Surgery)

## 2018-06-25 DIAGNOSIS — C3490 Malignant neoplasm of unspecified part of unspecified bronchus or lung: Secondary | ICD-10-CM

## 2018-06-26 ENCOUNTER — Other Ambulatory Visit: Payer: Self-pay | Admitting: Internal Medicine

## 2018-06-26 ENCOUNTER — Telehealth: Payer: Self-pay | Admitting: *Deleted

## 2018-06-26 ENCOUNTER — Ambulatory Visit
Admission: RE | Admit: 2018-06-26 | Discharge: 2018-06-26 | Disposition: A | Discharge status: Home / Self Care | Payer: Medicare Other | Source: Ambulatory Visit | Attending: Thoracic Surgery (Cardiothoracic Vascular Surgery) | Admitting: Thoracic Surgery (Cardiothoracic Vascular Surgery)

## 2018-06-26 ENCOUNTER — Encounter: Payer: Self-pay | Admitting: Thoracic Surgery (Cardiothoracic Vascular Surgery)

## 2018-06-26 ENCOUNTER — Ambulatory Visit (INDEPENDENT_AMBULATORY_CARE_PROVIDER_SITE_OTHER): Payer: Medicare Other | Admitting: Thoracic Surgery (Cardiothoracic Vascular Surgery)

## 2018-06-26 VITALS — BP 160/80 | HR 90 | Resp 16 | Ht 64.5 in | Wt 139.0 lb

## 2018-06-26 DIAGNOSIS — C3490 Malignant neoplasm of unspecified part of unspecified bronchus or lung: Secondary | ICD-10-CM

## 2018-06-26 DIAGNOSIS — Z902 Acquired absence of lung [part of]: Secondary | ICD-10-CM

## 2018-06-26 DIAGNOSIS — C3492 Malignant neoplasm of unspecified part of left bronchus or lung: Secondary | ICD-10-CM

## 2018-06-26 DIAGNOSIS — J948 Other specified pleural conditions: Secondary | ICD-10-CM

## 2018-06-26 NOTE — Progress Notes (Signed)
[address]       [place] [address]             [phone]     HPI: [name] returns for a scheduled follow-up visit  [name] is a 68 year old woman who had undergone a thoracoscopic left lower lobectomy for a stage IIa adenocarcinoma on April 25, 2018.  She had a persistent air leak postoperatively.  That was treated with intrabronchial valves.  The valves were removed on 06/13/2018 and after removal she had a hydropneumothorax.  Thoracentesis drained 750 mL of fluid and she had a pneumo ex vacuo.  I saw her back on 07/16/2018.  There was about a 40% pneumothorax and a small amount of fluid in the left chest.  We did a follow-up study the next day which was unchanged and then another study a few days later which also was unchanged.  She felt short of breath this morning and checked her oxygen saturations they were a little low but did come back above 90% with time.  She was very concerned that her chest x-ray might look worse.  She met with Dr. [name] last week and they ultimately decided not to do any chemotherapy or targeted therapy at this time.  She does not have any fever, chills, purulent sputum.  She does have occasional coughing spells although those have improved since the valves were removed.  She does still have some twinges of pain related to her incisions.  Past Medical History:  Diagnosis Date  . Anemia   . Atherosclerosis   . GERD (gastroesophageal reflux disease)   . Hemoptysis   . Hyperlipidemia   . Hypertension   . IBS (irritable bowel syndrome)   . Lung mass   . Migraines   . Spondylosis   . Vitamin D deficiency     Current Outpatient Medications  Medication Sig Dispense Refill  . beta carotene 25000 UNIT capsule Take 25,000 Units by mouth every other day.    . Cholecalciferol (VITAMIN D3) 50 MCG (2000 UT) TABS Take 4,000 Units by mouth daily.    . Coenzyme Q10 (COQ10) 200 MG CAPS Take 200 mg by mouth daily.    Marland Kitchen EPINEPHrine  (EPIPEN 2-PAK) 0.3 mg/0.3 mL IJ SOAJ injection Inject 0.3 mg into the muscle once.    . Levomefolate Glucosamine (METHYLFOLATE PO) Place 800 mcg under the tongue every other day.    . Multiple Vitamins-Minerals (EMERGEN-C IMMUNE PO) Take 1 packet by mouth daily as needed (for immune system).    . Olopatadine HCl (PATANASE) 0.6 % SOLN Place 1-2 sprays into the nose 2 (two) times daily as needed (for congestion).     . Omega-3 1000 MG CAPS Take 2,000 mg by mouth daily.     . Probiotic Product (PROBIOTIC PO) Take 1 capsule by mouth daily.    . Riboflavin 100 MG CAPS Take 200 mg by mouth every other day.     . rizatriptan (MAXALT) 10 MG tablet Take 5 mg by mouth as needed for migraine. May repeat in 2 hours if needed     . rosuvastatin (CRESTOR) 5 MG tablet Take 5 mg by mouth daily.    . sucralfate (CARAFATE) 1 g tablet Take 1 g by mouth See admin instructions. Crush and drink 1 g by mouth 2-3 times daily    . vitamin A 8000 UNIT capsule Take 8,000 Units by mouth every other day.    . vitamin B-12 (CYANOCOBALAMIN) 1000  MCG tablet Take 1,000 mcg by mouth every other day.    Marland Kitchen acetaminophen (TYLENOL) 160 MG/5ML solution Take 31.3 mLs (1,000 mg total) by mouth every 6 (six) hours as needed for moderate pain. (Patient not taking: Reported on 06/22/2018) 120 mL 0   No current facility-administered medications for this visit.     Physical Exam BP (!) 160/80 (BP Location: Right Arm, Patient Position: Sitting, Cuff Size: Normal)   Pulse 90   Resp 16   Ht 5' 4.5" (1.638 m)   Wt 139 lb (63 kg)   SpO2 97% Comment: ON RA  BMI 23.68 kg/m  68 year old woman in no acute distress Alert and oriented x3 with no focal deficits Diminished breath sounds on left side Incision and chest tube sites well-healed Cardiac regular rate and rhythm normal S1 and S2  Diagnostic Tests: CHEST - 2 VIEW  COMPARISON:  06/22/2018 and earlier.  FINDINGS: Postoperative changes to the left lung with staple line at  the hilum. Left side hydropneumothorax appears stable in size and configuration since 06/22/2018. No mediastinal shift. Stable cardiac size and mediastinal contours. The right lung appears stable in clear. Visualized tracheal air column is within normal limits. No acute osseous abnormality identified. Negative visible bowel gas pattern.  IMPRESSION: Status post left lower lobectomy with unchanged left hydropneumothorax since 06/22/2018.   Electronically Signed   By: [name] M.D.   On: 06/26/2018 15:13 I personally reviewed the chest x-ray and concur with the findings noted above.  Going back to her initial chest x-ray on 06/19/2018 there is a definite decrease in the size of the pneumothorax component.  The effusion component is stable.  Impression: [name] is a 68 year old woman who had a thoracoscopic left lower lobectomy in early January.  She is now about 2 months postop.  She had a prolonged air leak and had valves placed.  Her air leak resolved and she was discharged home.  When the valves were removed she had a hydropneumothorax.  She had a thoracentesis the following day which drained about 750 mL of fluid.  She had a pneumo ex vacuo after the fluid was withdrawn.  Cultures and cytology were both negative.  She is been followed for the past week after a chest x-ray showed a relatively large pneumothorax with a small effusion as well.  That is remained stable and in fact has improved if you go back a week.  That finding would not be consistent with a bronchopleural fistula.  In fact I think if she had a bronchopleural fistula she would have been extremely ill by now.  Plan: Return in 3 weeks with PA and lateral chest x-ray.  She knows to call if she develops any problems in the interim.  [name], MD [facility] [phone]

## 2018-06-26 NOTE — Telephone Encounter (Addendum)
[facility] [id number]; Called patient to follow up on the [id number] study.  She states she has considered it and spoken with some other friends and physicians.  Patient states she is going to decline enrolling in this study. She prefers to be followed and observed by Dr. [name] off study.  Thanked patient very much for her consideration of this study.  Informed her this research nurse will notify Dr. [name] and he will schedule her for follow up CT scan and MD appointments.  Patient had several questions about what to expect regarding follow up.  What symptoms to watch for and what to call for.  She also wanted to know if Dr. [name] can tell her what her chances of the lung cancer returning are and how soon she might expect that to happen. Informed patient I will relay her questions to Dr. [name].   [facility] [phone] Reminded patient about her enrollment in the [id number] study and that this study requests blood sample from patient for research.  Explained they want this sample to compare to her tumor tissue that was sent for genetic testing. The study asks for 3 vials (aprox 30 cc) blood to be sent within 30 days of registration which will be 07/07/18.  Asked patient if she could come in this afternoon since she is coming to town to see Dr. Roxan Hockey.  Patient said she is too weak and "borderline anemic" to give any blood at this time.  Patient reports feeling really tired and not willing to give any blood at this time.  Patient says she does want to provide her blood to the study at some point when she is feeling better.  Asked patient to let us know when she can do that and I will check with the study to see if she can still donate after the 30 days.   Reminded patient that research nurse will be following up with patient every 6 months to see how she is doing for this study.  Patient verbalized understanding. [name], BSN, RN Clinical Research Nurse 06/26/2018 9:43 AM   Spoke with Dr. [name] and  informed him patient declined participation in the [id number] study.  She prefers to be followed off study.  Dr. [name] instructed to schedule patient to have lab/ CT scan and see him again in 4 months.  Patient can have lab and CT in the morning and see him in the afternoon since she lives out of town and then she will not have to come back a different day for the CT results.  Dr. [name] instructs for patient to call his office for symptoms such as shortness of breath, coughing up blood, new pain or any other new questions/ concerns.  He instructs for patient to continue to see her family doctor for any other problems or urgent issues. Asked Dr. [name] if he knows statistically how long patient might have before her cancer could return. Dr. [name] says he does not know and there are not any good statistics to answer that question.  Called patient back and relayed the above information from Dr. [name].  She verbalized understanding. She asked this nurse to make sure Dr. [name] orders the CT Scan without contrast due to allergy.  Message sent to Dr. [name].  Thanked patient for her time and encouraged her to call back if any questions or concerns.  [name], BSN, RN Clinical Research Nurse 06/26/2018 2:37 PM

## 2018-06-26 NOTE — Telephone Encounter (Signed)
DCP-001 USE OF A CLINICAL TRIAL SCREENING TOOL TO ADDRESS CANCER HEALTH DISPARITIES IN THE [employer] COMMUNITY ONCOLOGY RESEARCH PROGRAM ([facility]): Patient enrolled on the [employer] [phone] study.  Research nurse called patient on the phone today to invite her to participate in the above DCP-001 study.  Patient will not be back in our clinic before the deadline to enroll and she lives out of town so research nurse called patient to do this over the phone.  Informed patient herparticipation in this study is completelyvoluntary. Spent15 minutes informing patient about the study using the "DCP-001 Patient Phone Script," including the purpose of the study, potential risksand benefits.Patient verbalized understanding, denied any questions and verbally agreed to participate. Research nurse then spent 52minutes asking patient the demographic questions for the study for which she answered easily.  Thanked patient for her time today and participation in this study. Patient meets eligibility and will be enrolled on this study. Completed worksheet placed in basket for research specialist, Remer Macho, to register patient to study.  [name], BSN, RN Clinical Research Nurse 06/26/2018 2:30 PM

## 2018-06-27 ENCOUNTER — Encounter: Payer: Self-pay | Admitting: *Deleted

## 2018-06-28 ENCOUNTER — Telehealth: Payer: Self-pay | Admitting: Internal Medicine

## 2018-06-28 NOTE — Telephone Encounter (Signed)
Scheduled appt per 3/10 sch message - pt aware of appts . Central radiology to contact patient with ct scan .

## 2018-07-16 ENCOUNTER — Other Ambulatory Visit: Payer: Self-pay | Admitting: Thoracic Surgery (Cardiothoracic Vascular Surgery)

## 2018-07-16 DIAGNOSIS — J90 Pleural effusion, not elsewhere classified: Secondary | ICD-10-CM

## 2018-07-17 ENCOUNTER — Ambulatory Visit: Payer: Self-pay | Admitting: Thoracic Surgery (Cardiothoracic Vascular Surgery)

## 2018-07-17 ENCOUNTER — Telehealth: Payer: Self-pay | Admitting: Thoracic Surgery (Cardiothoracic Vascular Surgery)

## 2018-07-17 NOTE — Telephone Encounter (Signed)
[name] did not wish to come to the office today due to the coronavirus outbreak.  She has not had any fevers or chills.  She has had problems with nasal congestion.  This is been going on for a couple of weeks.  She had questions about using an antibiotic.  A home health nurse was out yesterday and her oxygen saturations and vital signs were fine.  She has not had any new respiratory issues.  She had questions about how to respond if she thinks she might have the coronavirus.  I recommended that she call her primary physician first if that were to occur.  Emphasized the importance of hydration.  Recommended using Tylenol over ibuprofen for any symptom relief.  If she were to have acute respiratory issues she will need to go to emergency department.  [name], MD [facility] [phone]

## 2018-09-04 ENCOUNTER — Ambulatory Visit: Payer: Self-pay | Admitting: Thoracic Surgery (Cardiothoracic Vascular Surgery)

## 2018-09-04 ENCOUNTER — Ambulatory Visit
Admission: RE | Admit: 2018-09-04 | Discharge: 2018-09-04 | Disposition: A | Discharge status: Home / Self Care | Payer: Medicare Other | Source: Ambulatory Visit | Attending: Thoracic Surgery (Cardiothoracic Vascular Surgery) | Admitting: Thoracic Surgery (Cardiothoracic Vascular Surgery)

## 2018-09-04 ENCOUNTER — Ambulatory Visit (INDEPENDENT_AMBULATORY_CARE_PROVIDER_SITE_OTHER): Payer: Medicare Other | Admitting: Thoracic Surgery (Cardiothoracic Vascular Surgery)

## 2018-09-04 ENCOUNTER — Other Ambulatory Visit: Payer: Self-pay

## 2018-09-04 ENCOUNTER — Encounter: Payer: Self-pay | Admitting: Thoracic Surgery (Cardiothoracic Vascular Surgery)

## 2018-09-04 VITALS — BP 168/91 | HR 95 | Temp 98.3°F | Resp 16 | Ht 64.4 in | Wt 143.5 lb

## 2018-09-04 DIAGNOSIS — J90 Pleural effusion, not elsewhere classified: Secondary | ICD-10-CM

## 2018-09-04 DIAGNOSIS — Z902 Acquired absence of lung [part of]: Secondary | ICD-10-CM | POA: Diagnosis not present

## 2018-09-04 DIAGNOSIS — C3492 Malignant neoplasm of unspecified part of left bronchus or lung: Secondary | ICD-10-CM

## 2018-09-04 DIAGNOSIS — J948 Other specified pleural conditions: Secondary | ICD-10-CM
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
## 2018-09-05 ENCOUNTER — Telehealth: Payer: Self-pay | Admitting: *Deleted
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
## 2018-10-18 ENCOUNTER — Telehealth: Payer: Self-pay | Admitting: Internal Medicine

## 2018-10-18 NOTE — Telephone Encounter (Signed)
Spoke with patient re rescheduling lab/ct/MM all from 7/20 to 8/4 due to MM meeting. Per patient this is the day she can also coordinate a visit with Dr. Roxan Hockey. Patient lives in [place] and needs all appointments on same day. Per email from MM coordinate another day with patient if she does not want to see the APP. Patient declined f/u with APP and prefers to reschedule with MM  Patient has new d/t for lab/ct/MM 8/4 and has coordinated rescheduling of appointment with Dr. Roxan Hockey through his office.

## 2018-10-29 ENCOUNTER — Telehealth: Payer: Self-pay | Admitting: *Deleted

## 2018-10-29 NOTE — Telephone Encounter (Signed)
[id number]; ALCHEMIST STUDY; Called patient to follow up on blood specimen requested for the study.  Patient's lab appointment was changed from 7/20 to 11/20/18.  Asked patient if she would be willing to provide blood specimen during that visit for the study.  Informed her it would be 3 tubes = 30 ml or 2 TBSPs.   Patient asked if that is in addition to the blood Dr. [name] ordered, then she would not be able to give it.  Patient states she does not feel it would be good for her to have so much blood drawn at one time.  She states she is already dizzy and tired and going to have a long day of appointments.  She thinks having any extra blood drawn at this time would put added pressure on her and possibly drain her for the rest of the day.  Patient was very apologetic and states when she starts feeling better she would be willing to give blood on a day that she is otherwise not having any other blood drawn.  She also lives several hours away so it is difficult to come into the clinic, especially during the [place] pandemic. She says it is difficult to travel and she doesn't feel safe making extra visit to give blood for the study at this time.   Asked patient if ok for research nurse to continue to watch her schedule and contact her again to try to arrange blood sample in the future.  Patient agreed and states she does want to give blood at some point when she can safely come in on a different day.  Thanked patient for her time today.  Gave her my phone number again in case she has opportunity to come into clinic before our next contact.  [name], BSN, RN Clinical Research Nurse 10/29/2018 3:34 PM

## 2018-11-05 ENCOUNTER — Ambulatory Visit: Payer: Medicare Other | Admitting: Internal Medicine

## 2018-11-05 ENCOUNTER — Inpatient Hospital Stay: Payer: Medicare Other

## 2018-11-05 ENCOUNTER — Ambulatory Visit (HOSPITAL_COMMUNITY): Payer: Medicare Other

## 2018-11-05 ENCOUNTER — Encounter: Payer: Medicare Other | Admitting: Thoracic Surgery (Cardiothoracic Vascular Surgery)

## 2018-11-06 ENCOUNTER — Ambulatory Visit: Payer: Medicare Other | Admitting: Internal Medicine

## 2018-11-06 ENCOUNTER — Other Ambulatory Visit: Payer: Medicare Other

## 2018-11-15 ENCOUNTER — Telehealth: Payer: Self-pay | Admitting: Medical Oncology

## 2018-11-15 NOTE — Telephone Encounter (Signed)
Appt confirmed with pt. Per CT staff pt can eat before CT scan.

## 2018-11-19 ENCOUNTER — Telehealth: Payer: Self-pay | Admitting: Medical Oncology
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
## 2018-11-20 ENCOUNTER — Inpatient Hospital Stay: Payer: Medicare Other

## 2018-11-20 ENCOUNTER — Other Ambulatory Visit: Payer: Self-pay

## 2018-11-20 ENCOUNTER — Encounter: Payer: Self-pay | Admitting: Internal Medicine

## 2018-11-20 ENCOUNTER — Ambulatory Visit (INDEPENDENT_AMBULATORY_CARE_PROVIDER_SITE_OTHER): Payer: Medicare Other | Admitting: Thoracic Surgery (Cardiothoracic Vascular Surgery)

## 2018-11-20 ENCOUNTER — Ambulatory Visit (HOSPITAL_COMMUNITY)
Admission: RE | Admit: 2018-11-20 | Discharge: 2018-11-20 | Disposition: A | Discharge status: Home / Self Care | Payer: Medicare Other | Source: Ambulatory Visit | Attending: Internal Medicine | Admitting: Internal Medicine

## 2018-11-20 ENCOUNTER — Encounter: Payer: Self-pay | Admitting: Thoracic Surgery (Cardiothoracic Vascular Surgery)

## 2018-11-20 ENCOUNTER — Inpatient Hospital Stay: Payer: Medicare Other | Attending: Internal Medicine | Admitting: Internal Medicine

## 2018-11-20 VITALS — BP 170/90 | HR 86 | Temp 97.9°F | Resp 16 | Ht 64.4 in | Wt 158.8 lb

## 2018-11-20 VITALS — BP 147/65 | HR 79 | Temp 98.5°F | Resp 20 | Ht 64.4 in | Wt 150.5 lb

## 2018-11-20 DIAGNOSIS — Z902 Acquired absence of lung [part of]: Secondary | ICD-10-CM | POA: Diagnosis not present

## 2018-11-20 DIAGNOSIS — Z881 Allergy status to other antibiotic agents status: Secondary | ICD-10-CM | POA: Insufficient documentation

## 2018-11-20 DIAGNOSIS — Z880 Allergy status to penicillin: Secondary | ICD-10-CM | POA: Diagnosis not present

## 2018-11-20 DIAGNOSIS — C3432 Malignant neoplasm of lower lobe, left bronchus or lung: Secondary | ICD-10-CM | POA: Insufficient documentation

## 2018-11-20 DIAGNOSIS — Z885 Allergy status to narcotic agent status: Secondary | ICD-10-CM | POA: Insufficient documentation

## 2018-11-20 DIAGNOSIS — C3490 Malignant neoplasm of unspecified part of unspecified bronchus or lung: Secondary | ICD-10-CM

## 2018-11-20 DIAGNOSIS — Z79899 Other long term (current) drug therapy: Secondary | ICD-10-CM | POA: Insufficient documentation

## 2018-11-20 DIAGNOSIS — C3492 Malignant neoplasm of unspecified part of left bronchus or lung: Secondary | ICD-10-CM | POA: Diagnosis not present

## 2018-11-20 DIAGNOSIS — Z882 Allergy status to sulfonamides status: Secondary | ICD-10-CM | POA: Insufficient documentation

## 2018-11-20 DIAGNOSIS — I10 Essential (primary) hypertension: Secondary | ICD-10-CM | POA: Diagnosis not present

## 2018-11-20 DIAGNOSIS — R5383 Other fatigue: Secondary | ICD-10-CM | POA: Insufficient documentation

## 2018-11-20 DIAGNOSIS — Z888 Allergy status to other drugs, medicaments and biological substances status: Secondary | ICD-10-CM | POA: Insufficient documentation

## 2018-11-20 LAB — CMP (CANCER CENTER ONLY)
ALT: 21 U/L (ref 0–44)
AST: 22 U/L (ref 15–41)
Albumin: 4.4 g/dL (ref 3.5–5.0)
Alkaline Phosphatase: 85 U/L (ref 38–126)
Anion gap: 9 (ref 5–15)
BUN: 17 mg/dL (ref 8–23)
CO2: 26 mmol/L (ref 22–32)
Calcium: 9.9 mg/dL (ref 8.9–10.3)
Chloride: 105 mmol/L (ref 98–111)
Creatinine: 0.83 mg/dL (ref 0.44–1.00)
GFR, Est AFR Am: >60 mL/min (ref >60)
GFR, Estimated: >60 mL/min (ref >60)
Glucose, Bld: 108 mg/dL — ABNORMAL HIGH (ref 70–99)
Potassium: 4.7 mmol/L (ref 3.5–5.1)
Sodium: 140 mmol/L (ref 135–145)
Total Bilirubin: 0.4 mg/dL (ref 0.3–1.2)
Total Protein: 7.2 g/dL (ref 6.5–8.1)

## 2018-11-20 LAB — CBC WITH DIFFERENTIAL (CANCER CENTER ONLY)
Abs Immature Granulocytes: 0.03 10*3/uL (ref 0.00–0.07)
Basophils Absolute: 0 10*3/uL (ref 0.0–0.1)
Basophils Relative: 0 %
Eosinophils Absolute: 0.1 10*3/uL (ref 0.0–0.5)
Eosinophils Relative: 1 %
HCT: 36.2 % (ref 36.0–46.0)
Hemoglobin: 11.7 g/dL — ABNORMAL LOW (ref 12.0–15.0)
Immature Granulocytes: 0 %
Lymphocytes Relative: 29 %
Lymphs Abs: 2.6 10*3/uL (ref 0.7–4.0)
MCH: 30.5 pg (ref 26.0–34.0)
MCHC: 32.3 g/dL (ref 30.0–36.0)
MCV: 94.5 fL (ref 80.0–100.0)
Monocytes Absolute: 0.6 10*3/uL (ref 0.1–1.0)
Monocytes Relative: 7 %
Neutro Abs: 5.6 10*3/uL (ref 1.7–7.7)
Neutrophils Relative %: 63 %
Platelet Count: 298 10*3/uL (ref 150–400)
RBC: 3.83 MIL/uL — ABNORMAL LOW (ref 3.87–5.11)
RDW: 12.1 % (ref 11.5–15.5)
WBC Count: 9 10*3/uL (ref 4.0–10.5)
nRBC: 0 % (ref 0.0–0.2)

## 2018-11-20 NOTE — Progress Notes (Signed)
[address]       [place] [address]             [phone]      HPI: Mrs. Dinges returns for a scheduled follow-up visit  [name] is a 68 year old woman with a past medical history significant for hypertension, hyperlipidemia, reflux, aortic atherosclerosis, migraines, anemia, IBS, vitamin D deficiency, and multiple medication allergies.  She is a lifelong non-smoker.  She presented with hemoptysis.  She was found to have a left lower lobe lung mass on a chest CT.  The mass was hypermetabolic on PET/CT.  Dr. Lamonte Sakai did a navigational bronchoscopy which was positive for adenocarcinoma.  I did a thoracoscopic left lower lobectomy on 04/25/2018.  Her postoperative course was complicated by a prolonged air leak requiring intrabronchial valve placement.  She had those removed in late February.  The mass turned out to be a stage IIa (T2b, N0) adenocarcinoma.  She opted not to have chemotherapy.  I last saw her in the office on 09/04/2018.  She was feeling significantly better at that time.    She saw Dr. [name] earlier today.  She opted not to participate in a clinical trial with Tarceva versus placebo.  She is feeling better.  She says that she is less short of breath when she is walking upstairs now.  She does still at times feel very tired and fatigued.  Her sense of smell has not returned since the removal of the intrabronchial valves in late February.  She does not have incisional pain but does have some sensitivity under the left breast.  Past Medical History:  Diagnosis Date  . Anemia   . Atherosclerosis   . GERD (gastroesophageal reflux disease)   . Hemoptysis   . Hyperlipidemia   . Hypertension   . IBS (irritable bowel syndrome)   . Lung mass   . Migraines   . Spondylosis   . Vitamin D deficiency     Current Outpatient Medications  Medication Sig Dispense Refill  . acetaminophen (TYLENOL) 160 MG/5ML solution Take 31.3 mLs (1,000 mg total) by mouth  every 6 (six) hours as needed for moderate pain. 120 mL 0  . Cholecalciferol (VITAMIN D3) 50 MCG (2000 UT) TABS Take 4,000 Units by mouth daily.    . Coenzyme Q10 (COQ10) 200 MG CAPS Take 200 mg by mouth daily.    Marland Kitchen diltiazem (DILACOR XR) 120 MG 24 hr capsule Take 120 mg by mouth daily. Takes 2 hours after Crestor.    Marland Kitchen EPINEPHrine (EPIPEN 2-PAK) 0.3 mg/0.3 mL IJ SOAJ injection Inject 0.3 mg into the muscle once.    . Levomefolate Glucosamine (METHYLFOLATE PO) Place 800 mcg under the tongue every other day.    . Multiple Vitamins-Minerals (EMERGEN-C IMMUNE PO) Take 1 packet by mouth daily as needed (for immune system).    . Olopatadine HCl (PATANASE) 0.6 % SOLN Place 1-2 sprays into the nose 2 (two) times daily as needed (for congestion).     . Omega-3 1000 MG CAPS Take 2,000 mg by mouth daily.     . Probiotic Product (PROBIOTIC PO) Take 1 capsule by mouth daily.    . Riboflavin 100 MG CAPS Take 200 mg by mouth every other day.     . rizatriptan (MAXALT) 10 MG tablet Take 5 mg by mouth as needed for migraine. May repeat in 2 hours if needed     . rosuvastatin (CRESTOR) 5 MG tablet Take 5 mg  by mouth daily.    . sucralfate (CARAFATE) 1 g tablet Take 1 g by mouth See admin instructions. Crush and drink 1 g by mouth 2-3 times daily    . vitamin B-12 (CYANOCOBALAMIN) 1000 MCG tablet Take 1,000 mcg by mouth every other day.     No current facility-administered medications for this visit.     Physical Exam BP (!) 170/90 (BP Location: Left Arm, Patient Position: Sitting, Cuff Size: Normal)   Pulse 86   Temp 97.9 F (36.6 C) Comment: THERMAL  Resp 16   Ht 5' 4.4" (1.636 m)   Wt 158 lb 12.8 oz (72 kg)   SpO2 98% Comment: ON RA  BMI 26.85 kg/m  68 year old woman in no acute distress Alert and oriented x3 with no focal deficits Wearing surgical mask Lungs slightly diminished at left base, otherwise clear Cardiac regular rate and rhythm normal S1 and S2  Diagnostic Tests: CT CHEST WITHOUT  CONTRAST  TECHNIQUE: Multidetector CT imaging of the chest was performed following the standard protocol without IV contrast.  COMPARISON:  05/23/2018  FINDINGS: Cardiovascular: Heart size normal. No pericardial effusion. Calcification in the LAD and RCA coronary artery. Aortic atherosclerosis.  Mediastinum/Nodes: There is leftward shift of the mediastinum into the left hemithorax status post left lower lobectomy. No enlarged mediastinal or hilar lymph nodes.  Lungs/Pleura: Status post left lower lobectomy. Small loculated left pleural effusion has decreased in volume compared with previous exam. No suspicious findings in the left lung to suggest residual tumor. Within the right lower lobe there is a difficult to see sub solid nodule which measures at least 1.3 cm, image 89/5. Previously this measured the same.  Upper Abdomen: No acute abnormality.  Musculoskeletal: No chest wall mass or suspicious bone lesions identified.  IMPRESSION: 1. Stable postop change status post left lower lobectomy. No findings identified to suggest residual tumor or metastatic disease. 2. There is a ground-glass nodule within the right lower lobe (difficult to see due to peribronchovascular location on previous exam). This measures 1.3 cm. Adenocarcinoma cannot be excluded. Follow up by CT is recommended in 12 months, with continued annual surveillance for a minimum of 3 years. These recommendations are taken from: Recommendations for the Management of Subsolid Pulmonary Nodules Detected at CT: A Statement from the [facility] 2013; 266:1, 304-317. 3. Aortic Atherosclerosis (ICD10-I70.0). Coronary artery calcification.   Electronically Signed   By: Kerby Moors M.D.   On: 11/20/2018 15:03 I personally reviewed the CT images and concur with the findings noted above  Impression: [name] is a 68 year old woman with past history of hypertension,  hyperlipidemia, reflux, aortic atherosclerosis, migraines, anemia, IBS, vitamin D deficiency, and multiple medication allergies.  She is a lifelong non-smoker.  She presented with hemoptysis and was found to have an adenocarcinoma of the left lower lobe.  She had a thoracoscopic left lower lobectomy that was complicated by prolonged air leak.  That required intrabronchial valve placement.  Those were ultimately removed in late February.  She currently is doing extremely well.  She looks great.  She has noticed significant improvement in her exercise tolerance.  She does still have some sensitivity under the left breast, which may always be present to some degree.  Her biggest complaint currently is that she lost her sense of taste and smell.  She relates this more to the timing of the removal of the intrabronchial valves than her initial surgery.  That likely is related to anesthesia and should improve with  time.  There are no limitations on her activities.  She understands that her pulmonary function will never be 100% of normal but that if she exercises on a regular basis her overall cardiorespiratory fitness can improve.  She opted not to spread in a clinical trial.  She has numerous medication allergies and is really concerned about taking any new medications without a definite indication.  She does have a small groundglass opacity in the right lower lobe.  This was present on her initial scans dating back to November 2019.  Is unchanged over that timeframe.  Most likely is benign, but there is always a possibility it could represent a second adenocarcinoma.  Will be monitored on her CT scans.  Plan: Return in 6 months after seeing Dr. [name].  [name], MD [facility] [phone]

## 2018-11-20 NOTE — Progress Notes (Signed)
[name]:([phone]   [phone]  OFFICE PROGRESS NOTE  Donald Prose, MD [place] [address]  DIAGNOSIS: Stage IIA (T2b, N0, M0) non-small cell lung cancer, adenocarcinoma diagnosed in November 2019  Molecular studies showed EGFR mutation in exon 21 (L858R) PRIOR THERAPY: status post left lower lobectomy with lymph node dissection on April 25, 2018 under the care of Dr. Roxan Hockey.  CURRENT THERAPY: Observation.  INTERVAL HISTORY: [name] 68 y.o. female returns to the clinic today for 6 months follow-up visit.  The patient is feeling fine today with no concerning complaints except for mild fatigue.  She feels much better compared to 6 months ago.  She denied having any current chest pain, shortness of breath, cough or hemoptysis.  She has no nausea, vomiting, diarrhea or constipation.  She has no headache or visual changes.  She was recently diagnosed with hypertension she has extensive work-up by her primary care physician to identify the underlying etiology of her hypertension.  The patient is here today for evaluation and repeat CT scan of the chest for restaging of her disease.  MEDICAL HISTORY: Past Medical History:  Diagnosis Date   Anemia    Atherosclerosis    GERD (gastroesophageal reflux disease)    Hemoptysis    Hyperlipidemia    Hypertension    IBS (irritable bowel syndrome)    Lung mass    Migraines    Spondylosis    Vitamin D deficiency     ALLERGIES:  is allergic to avocado; influenza vaccines; lisinopril; metoprolol; nexium [esomeprazole magnesium]; penicillins; pork-derived products; proton pump inhibitors; sulfites; wasp venom protein; zantac [ranitidine hcl]; cefaclor; chlorhexidine; chloroxine; ciprofloxacin hcl; clarithromycin; gatifloxacin; other; sulfa antibiotics; sulfamethoxazole-trimethoprim; eggs or egg-derived products; aspartame; chocolate; dilaudid [hydromorphone  hcl]; fentanyl; ivp dye [iodinated diagnostic agents]; ketorolac; lactase; milk-related compounds; molds & smuts; oxycodone; prednisolone; shellfish allergy; and soap.  MEDICATIONS:  Current Outpatient Medications  Medication Sig Dispense Refill   acetaminophen (TYLENOL) 160 MG/5ML solution Take 31.3 mLs (1,000 mg total) by mouth every 6 (six) hours as needed for moderate pain. 120 mL 0   Cholecalciferol (VITAMIN D3) 50 MCG (2000 UT) TABS Take 4,000 Units by mouth daily.     Coenzyme Q10 (COQ10) 200 MG CAPS Take 200 mg by mouth daily.     diltiazem (DILACOR XR) 120 MG 24 hr capsule Take 120 mg by mouth daily. Takes 2 hours after Crestor.     Levomefolate Glucosamine (METHYLFOLATE PO) Place 800 mcg under the tongue every other day.     Multiple Vitamins-Minerals (EMERGEN-C IMMUNE PO) Take 1 packet by mouth daily as needed (for immune system).     Olopatadine HCl (PATANASE) 0.6 % SOLN Place 1-2 sprays into the nose 2 (two) times daily as needed (for congestion).      Omega-3 1000 MG CAPS Take 2,000 mg by mouth daily.      Probiotic Product (PROBIOTIC PO) Take 1 capsule by mouth daily.     Riboflavin 100 MG CAPS Take 200 mg by mouth every other day.      rizatriptan (MAXALT) 10 MG tablet Take 5 mg by mouth as needed for migraine. May repeat in 2 hours if needed      rosuvastatin (CRESTOR) 5 MG tablet Take 5 mg by mouth daily.     sucralfate (CARAFATE) 1 g tablet Take 1 g by mouth See admin instructions. Crush and drink 1 g by mouth 2-3 times daily  vitamin B-12 (CYANOCOBALAMIN) 1000 MCG tablet Take 1,000 mcg by mouth every other day.     beta carotene 25000 UNIT capsule Take 25,000 Units by mouth every other day.     EPINEPHrine (EPIPEN 2-PAK) 0.3 mg/0.3 mL IJ SOAJ injection Inject 0.3 mg into the muscle once.     vitamin A 8000 UNIT capsule Take 8,000 Units by mouth every other day.     No current facility-administered medications for this visit.     SURGICAL HISTORY:    Past Surgical History:  Procedure Laterality Date   BROW LIFT AND BLEPHAROPLASTY  03/15/2013   CATARACT EXTRACTION, BILATERAL  06/23/2014   CHEST TUBE INSERTION Left 05/10/2018   Procedure: CHEST TUBE INSERTION;  Surgeon: [name], MD;  Location: [facility];  Service: Thoracic;  Laterality: Left;   COLONOSCOPY  05/2014   ESOPHAGOGASTRODUODENOSCOPY  2019   EYE SURGERY     IR THORACENTESIS ASP PLEURAL SPACE W/IMG GUIDE  05/24/2018   IR THORACENTESIS ASP PLEURAL SPACE W/IMG GUIDE  06/14/2018   TONSILLECTOMY     VIDEO ASSISTED THORACOSCOPY (VATS)/ LOBECTOMY Left 04/25/2018   Procedure: VIDEO ASSISTED THORACOSCOPY (VATS)LEFT LOWER LOBECTOMY, NODE DISSECTION;  Surgeon: [name], MD;  Location: [facility];  Service: Thoracic;  Laterality: Left;   VIDEO BRONCHOSCOPY WITH ENDOBRONCHIAL NAVIGATION N/A 03/07/2018   Procedure: VIDEO BRONCHOSCOPY WITH ENDOBRONCHIAL NAVIGATION;  Surgeon: Collene Gobble, MD;  [facility];  Service: Thoracic;  Laterality: N/A;   VIDEO BRONCHOSCOPY WITH INSERTION OF INTERBRONCHIAL VALVE (IBV) N/A 05/10/2018   Procedure: VIDEO BRONCHOSCOPY WITH INSERTION OF INTERBRONCHIAL VALVE (IBV);  Surgeon: [name], MD;  Location: [facility] OR;  Service: Thoracic;  Laterality: N/A;   VIDEO BRONCHOSCOPY WITH INSERTION OF INTERBRONCHIAL VALVE (IBV) N/A 06/13/2018   Procedure: VIDEO BRONCHOSCOPY WITH REMOVAL OF INTERBRONCHIAL VALVE (IBV) x 2;  Surgeon: [name], MD;  Location: [facility];  Service: Thoracic;  Laterality: N/A;    REVIEW OF SYSTEMS:  Constitutional: positive for fatigue Eyes: negative Ears, nose, mouth, throat, and face: negative Respiratory: negative Cardiovascular: negative Gastrointestinal: negative Genitourinary:negative Integument/breast: negative Hematologic/lymphatic: negative Musculoskeletal:negative Neurological: negative Behavioral/Psych: negative Endocrine: negative Allergic/Immunologic: negative   PHYSICAL  EXAMINATION: General appearance: alert, cooperative, fatigued and no distress Head: Normocephalic, without obvious abnormality, atraumatic Neck: no adenopathy, no JVD, supple, symmetrical, trachea midline and thyroid not enlarged, symmetric, no tenderness/mass/nodules Lymph nodes: Cervical, supraclavicular, and axillary nodes normal. Resp: clear to auscultation bilaterally Back: symmetric, no curvature. ROM normal. No CVA tenderness. Cardio: regular rate and rhythm, S1, S2 normal, no murmur, click, rub or gallop GI: soft, non-tender; bowel sounds normal; no masses,  no organomegaly Extremities: extremities normal, atraumatic, no cyanosis or edema Neurologic: Alert and oriented X 3, normal strength and tone. Normal symmetric reflexes. Normal coordination and gait  ECOG PERFORMANCE STATUS: 1 - Symptomatic but completely ambulatory  Blood pressure (!) 147/65, pulse 79, temperature 98.5 F (36.9 C), temperature source Oral, resp. rate 20, height 5' 4.4" (1.636 m), weight 150 lb 8 oz (68.3 kg), SpO2 100 %.  LABORATORY DATA: Lab Results  Component Value Date   WBC 9.0 11/20/2018   HGB 11.7 (L) 11/20/2018   HCT 36.2 11/20/2018   MCV 94.5 11/20/2018   PLT 298 11/20/2018      Chemistry      Component Value Date/Time   NA 140 11/20/2018 1132   K 4.7 11/20/2018 1132   CL 105 11/20/2018 1132   CO2 26 11/20/2018 1132   BUN 17 11/20/2018 1132  CREATININE 0.83 11/20/2018 1132      Component Value Date/Time   CALCIUM 9.9 11/20/2018 1132   ALKPHOS 85 11/20/2018 1132   AST 22 11/20/2018 1132   ALT 21 11/20/2018 1132   BILITOT 0.4 11/20/2018 1132       RADIOGRAPHIC STUDIES: No results found.  ASSESSMENT AND PLAN: This is a very pleasant 68 years old white female with a stage IIa non-small cell lung cancer, adenocarcinoma with positive EGFR mutation in exon 21 (L858R) status post left lower lobectomy with lymph node dissection under the care of Dr. Roxan Hockey. The patient is currently  on observation.  She declined to proceed with adjuvant systemic chemotherapy or targeted therapy. She had repeat CT scan of the chest performed recently.  I personally and independently reviewed the scan images and discussed the results are shown the images to the patient today. Her scan showed no concerning findings for disease recurrence or progression and there was improvement on the left sided pleural effusion as well as the scarring after her previous surgery. I recommended for the patient to continue on observation with repeat CT scan of the chest without contrast in 6 months. The patient is scheduled to see Dr. Roxan Hockey later today for evaluation. She was advised to call immediately if she has any concerning symptoms in the interval. The patient voices understanding of current disease status and treatment options and is in agreement with the current care plan. All questions were answered. The patient knows to call the clinic with any problems, questions or concerns. We can certainly see the patient much sooner if necessary.  I spent 20 minutes counseling the patient face to face. The total time spent in the appointment was 30 minutes.  Disclaimer: This note was dictated with voice recognition software. Similar sounding words can inadvertently be transcribed and may not be corrected upon review.

## 2018-11-21 ENCOUNTER — Telehealth: Payer: Self-pay | Admitting: Internal Medicine

## 2018-11-21 NOTE — Telephone Encounter (Signed)
Scheduled appt per 8/04 los - mailed letter with appt date and time . Central radiology to contact patient with ct scan

## 2019-03-27 ENCOUNTER — Encounter: Payer: Self-pay | Admitting: *Deleted

## 2019-05-15 ENCOUNTER — Telehealth: Payer: Self-pay | Admitting: *Deleted

## 2019-05-15 NOTE — Telephone Encounter (Signed)
[id number]; ALCHEMIST STUDY; Called patient to follow up on blood specimen requested for the study.  Patient is scheduled for lab appointment next week on 05/21/19.  Asked patient if she would be willing to provide blood specimen during that visit for the study.  Informed her it would be 3 tubes = 30 ml or 2 TBSPs.   Patient declined as she does not feel it would be good for her to have so much blood drawn at one time.  Patient stated she is going to have a long day of appointments and is also getting her second Covid19 vaccine the next day. She thinks having any extra blood drawn at this time could make her feel weak.   Patient states when she starts feeling better she would be willing to give blood on a day that she is otherwise not having any other blood drawn.   Asked patient if ok for research nurse to continue to watch her schedule and contact her again to try to arrange blood sample in the future.  Patient agreed and states she does want to give blood at some point when she can safely come in on a different day.  Although patient lives several hours away so this would be difficult.  Thanked patient for her time today and ongoing participation in this study.  Informed her that research nurse will be collecting information to report to study for 6 months follow up after she sees Dr. [name] next week. Gave her my phone number again in case she has opportunity to come into clinic before our next contact.  Patient verbalized understanding.  [name], BSN, RN Clinical Research Nurse 05/15/2019 12:12 PM

## 2019-05-21 ENCOUNTER — Telehealth: Payer: Self-pay | Admitting: Medical Oncology

## 2019-05-21 ENCOUNTER — Ambulatory Visit (HOSPITAL_COMMUNITY)
Admission: RE | Admit: 2019-05-21 | Discharge: 2019-05-21 | Disposition: A | Discharge status: Home / Self Care | Payer: Medicare Other | Source: Ambulatory Visit | Attending: Internal Medicine | Admitting: Internal Medicine

## 2019-05-21 ENCOUNTER — Inpatient Hospital Stay (HOSPITAL_BASED_OUTPATIENT_CLINIC_OR_DEPARTMENT_OTHER): Payer: Medicare Other | Admitting: Internal Medicine

## 2019-05-21 ENCOUNTER — Inpatient Hospital Stay: Payer: Medicare Other | Attending: Internal Medicine

## 2019-05-21 ENCOUNTER — Ambulatory Visit (INDEPENDENT_AMBULATORY_CARE_PROVIDER_SITE_OTHER): Payer: Medicare Other | Admitting: Thoracic Surgery (Cardiothoracic Vascular Surgery)

## 2019-05-21 ENCOUNTER — Encounter: Payer: Self-pay | Admitting: Internal Medicine

## 2019-05-21 ENCOUNTER — Other Ambulatory Visit: Payer: Self-pay

## 2019-05-21 ENCOUNTER — Encounter: Payer: Self-pay | Admitting: Thoracic Surgery (Cardiothoracic Vascular Surgery)

## 2019-05-21 VITALS — BP 138/76 | HR 91 | Temp 97.7°F | Resp 16 | Ht 64.0 in | Wt 155.0 lb

## 2019-05-21 VITALS — BP 138/76 | HR 91 | Temp 97.8°F | Resp 19 | Ht 64.0 in | Wt 158.0 lb

## 2019-05-21 DIAGNOSIS — C3432 Malignant neoplasm of lower lobe, left bronchus or lung: Secondary | ICD-10-CM | POA: Diagnosis not present

## 2019-05-21 DIAGNOSIS — J90 Pleural effusion, not elsewhere classified: Secondary | ICD-10-CM | POA: Diagnosis not present

## 2019-05-21 DIAGNOSIS — I10 Essential (primary) hypertension: Secondary | ICD-10-CM | POA: Insufficient documentation

## 2019-05-21 DIAGNOSIS — C3490 Malignant neoplasm of unspecified part of unspecified bronchus or lung: Secondary | ICD-10-CM

## 2019-05-21 DIAGNOSIS — F419 Anxiety disorder, unspecified: Secondary | ICD-10-CM | POA: Diagnosis not present

## 2019-05-21 DIAGNOSIS — K219 Gastro-esophageal reflux disease without esophagitis: Secondary | ICD-10-CM | POA: Diagnosis not present

## 2019-05-21 DIAGNOSIS — R0781 Pleurodynia: Secondary | ICD-10-CM | POA: Insufficient documentation

## 2019-05-21 DIAGNOSIS — Z79899 Other long term (current) drug therapy: Secondary | ICD-10-CM | POA: Diagnosis not present

## 2019-05-21 DIAGNOSIS — Z902 Acquired absence of lung [part of]: Secondary | ICD-10-CM

## 2019-05-21 DIAGNOSIS — R222 Localized swelling, mass and lump, trunk: Secondary | ICD-10-CM | POA: Diagnosis not present

## 2019-05-21 DIAGNOSIS — E785 Hyperlipidemia, unspecified: Secondary | ICD-10-CM | POA: Diagnosis not present

## 2019-05-21 DIAGNOSIS — R5383 Other fatigue: Secondary | ICD-10-CM | POA: Diagnosis not present

## 2019-05-21 DIAGNOSIS — K589 Irritable bowel syndrome without diarrhea: Secondary | ICD-10-CM | POA: Insufficient documentation

## 2019-05-21 LAB — CBC WITH DIFFERENTIAL (CANCER CENTER ONLY)
Abs Immature Granulocytes: 0.02 10*3/uL (ref 0.00–0.07)
Basophils Absolute: 0.1 10*3/uL (ref 0.0–0.1)
Basophils Relative: 1 %
Eosinophils Absolute: 0.1 10*3/uL (ref 0.0–0.5)
Eosinophils Relative: 1 %
HCT: 35.5 % — ABNORMAL LOW (ref 36.0–46.0)
Hemoglobin: 11.5 g/dL — ABNORMAL LOW (ref 12.0–15.0)
Immature Granulocytes: 0 %
Lymphocytes Relative: 31 %
Lymphs Abs: 2.5 10*3/uL (ref 0.7–4.0)
MCH: 31.2 pg (ref 26.0–34.0)
MCHC: 32.4 g/dL (ref 30.0–36.0)
MCV: 96.2 fL (ref 80.0–100.0)
Monocytes Absolute: 0.6 10*3/uL (ref 0.1–1.0)
Monocytes Relative: 8 %
Neutro Abs: 4.7 10*3/uL (ref 1.7–7.7)
Neutrophils Relative %: 59 %
Platelet Count: 297 10*3/uL (ref 150–400)
RBC: 3.69 MIL/uL — ABNORMAL LOW (ref 3.87–5.11)
RDW: 12 % (ref 11.5–15.5)
WBC Count: 8 10*3/uL (ref 4.0–10.5)
nRBC: 0 % (ref 0.0–0.2)

## 2019-05-21 LAB — CMP (CANCER CENTER ONLY)
ALT: 19 U/L (ref 0–44)
AST: 20 U/L (ref 15–41)
Albumin: 4.5 g/dL (ref 3.5–5.0)
Alkaline Phosphatase: 84 U/L (ref 38–126)
Anion gap: 10 (ref 5–15)
BUN: 16 mg/dL (ref 8–23)
CO2: 28 mmol/L (ref 22–32)
Calcium: 9.8 mg/dL (ref 8.9–10.3)
Chloride: 103 mmol/L (ref 98–111)
Creatinine: 0.89 mg/dL (ref 0.44–1.00)
GFR, Est AFR Am: >60 mL/min (ref >60)
GFR, Estimated: >60 mL/min (ref >60)
Glucose, Bld: 103 mg/dL — ABNORMAL HIGH (ref 70–99)
Potassium: 4.2 mmol/L (ref 3.5–5.1)
Sodium: 141 mmol/L (ref 135–145)
Total Bilirubin: 0.4 mg/dL (ref 0.3–1.2)
Total Protein: 7.5 g/dL (ref 6.5–8.1)

## 2019-05-21 NOTE — Progress Notes (Signed)
[facility] [phone]   Fax:(336) [phone]  OFFICE PROGRESS NOTE  Donald Prose, MD [place] [address]  DIAGNOSIS: Suspicious recurrent non-small cell lung cancer initially diagnosed as stage IIA (T2b, N0, M0) non-small cell lung cancer, adenocarcinoma diagnosed in November 2019  Molecular studies showed EGFR mutation in exon 21 (L858R)  PRIOR THERAPY: status post left lower lobectomy with lymph node dissection on April 25, 2018 under the care of Dr. Roxan Hockey.  She declined adjuvant therapy  CURRENT THERAPY: Observation.  INTERVAL HISTORY: [name] 69 y.o. female returns to the clinic today for follow-up visit accompanied by her husband Legrand Como.  The patient is feeling fine today with no concerning complaints except for anxiety and intermittent left lower rib cage pain.  She denied having any shortness of breath, cough or hemoptysis.  She denied having any fever or chills.  She has no nausea, vomiting, diarrhea or constipation.  She denied having any headache or visual changes.  She has no recent weight loss or night sweats.  She had repeat CT scan of the chest performed earlier today and she is here for evaluation and discussion of her scan results.  MEDICAL HISTORY: Past Medical History:  Diagnosis Date  . Anemia   . Atherosclerosis   . GERD (gastroesophageal reflux disease)   . Hemoptysis   . Hyperlipidemia   . Hypertension   . IBS (irritable bowel syndrome)   . Lung mass   . Migraines   . Spondylosis   . Vitamin D deficiency     ALLERGIES:  is allergic to avocado; influenza vaccines; lisinopril; metoprolol; nexium [esomeprazole magnesium]; penicillins; pork-derived products; proton pump inhibitors; sulfites; wasp venom protein; zantac [ranitidine hcl]; cefaclor; chlorhexidine; chloroxine; ciprofloxacin hcl; clarithromycin; gatifloxacin; other; sulfa antibiotics; sulfamethoxazole-trimethoprim; eggs or  egg-derived products; aspartame; chocolate; dilaudid [hydromorphone hcl]; fentanyl; ivp dye [iodinated diagnostic agents]; ketorolac; lactase; milk-related compounds; molds & smuts; oxycodone; prednisolone; shellfish allergy; and soap.  MEDICATIONS:  Current Outpatient Medications  Medication Sig Dispense Refill  . acetaminophen (TYLENOL) 160 MG/5ML solution Take 31.3 mLs (1,000 mg total) by mouth every 6 (six) hours as needed for moderate pain. 120 mL 0  . Cholecalciferol (VITAMIN D3) 50 MCG (2000 UT) TABS Take 4,000 Units by mouth daily.    . Coenzyme Q10 (COQ10) 200 MG CAPS Take 200 mg by mouth daily.    Marland Kitchen diltiazem (DILACOR XR) 120 MG 24 hr capsule Take 120 mg by mouth daily. Takes 2 hours after Crestor.    Marland Kitchen EPINEPHrine (EPIPEN 2-PAK) 0.3 mg/0.3 mL IJ SOAJ injection Inject 0.3 mg into the muscle once.    . Levomefolate Glucosamine (METHYLFOLATE PO) Place 800 mcg under the tongue every other day.    . Multiple Vitamins-Minerals (EMERGEN-C IMMUNE PO) Take 1 packet by mouth daily as needed (for immune system).    . Olopatadine HCl (PATANASE) 0.6 % SOLN Place 1-2 sprays into the nose 2 (two) times daily as needed (for congestion).     . Omega-3 1000 MG CAPS Take 2,000 mg by mouth daily.     . Probiotic Product (PROBIOTIC PO) Take 1 capsule by mouth daily.    . Riboflavin 100 MG CAPS Take 200 mg by mouth every other day.     . rizatriptan (MAXALT) 10 MG tablet Take 5 mg by mouth as needed for migraine. May repeat in 2 hours if needed     . rosuvastatin (CRESTOR) 5 MG tablet Take 5 mg  by mouth daily.    . sucralfate (CARAFATE) 1 g tablet Take 1 g by mouth See admin instructions. Crush and drink 1 g by mouth 2-3 times daily    . vitamin B-12 (CYANOCOBALAMIN) 1000 MCG tablet Take 1,000 mcg by mouth every other day.     No current facility-administered medications for this visit.    SURGICAL HISTORY:  Past Surgical History:  Procedure Laterality Date  . BROW LIFT AND BLEPHAROPLASTY  03/15/2013   . CATARACT EXTRACTION, BILATERAL  06/23/2014  . CHEST TUBE INSERTION Left 05/10/2018   Procedure: CHEST TUBE INSERTION;  Surgeon: [name], MD;  Location: [facility];  Service: Thoracic;  Laterality: Left;  . COLONOSCOPY  05/2014  . ESOPHAGOGASTRODUODENOSCOPY  2019  . EYE SURGERY    . IR THORACENTESIS ASP PLEURAL SPACE W/IMG GUIDE  05/24/2018  . IR THORACENTESIS ASP PLEURAL SPACE W/IMG GUIDE  06/14/2018  . TONSILLECTOMY    . VIDEO ASSISTED THORACOSCOPY (VATS)/ LOBECTOMY Left 04/25/2018   Procedure: VIDEO ASSISTED THORACOSCOPY (VATS)LEFT LOWER LOBECTOMY, NODE DISSECTION;  Surgeon: [name], MD;  Location: [facility];  Service: Thoracic;  Laterality: Left;  Marland Kitchen VIDEO BRONCHOSCOPY WITH ENDOBRONCHIAL NAVIGATION N/A 03/07/2018   Procedure: VIDEO BRONCHOSCOPY WITH ENDOBRONCHIAL NAVIGATION;  Surgeon: Collene Gobble, MD;  Location: [facility];  Service: Thoracic;  Laterality: N/A;  . VIDEO BRONCHOSCOPY WITH INSERTION OF INTERBRONCHIAL VALVE (IBV) N/A 05/10/2018   Procedure: VIDEO BRONCHOSCOPY WITH INSERTION OF INTERBRONCHIAL VALVE (IBV);  Surgeon: [name], MD;  Location[facility] OR;  Service: Thoracic;  Laterality: N/A;  . VIDEO BRONCHOSCOPY WITH INSERTION OF INTERBRONCHIAL VALVE (IBV) N/A 06/13/2018   Procedure: VIDEO BRONCHOSCOPY WITH REMOVAL OF INTERBRONCHIAL VALVE (IBV) x 2;  Surgeon: [name], MD;  Location: [facility];  Service: Thoracic;  Laterality: N/A;    REVIEW OF SYSTEMS:  Constitutional: positive for fatigue Eyes: negative Ears, nose, mouth, throat, and face: negative Respiratory: positive for pleurisy/chest pain Cardiovascular: negative Gastrointestinal: negative Genitourinary:negative Integument/breast: negative Hematologic/lymphatic: negative Musculoskeletal:negative Neurological: negative Behavioral/Psych: positive for anxiety Endocrine: negative Allergic/Immunologic: negative   PHYSICAL EXAMINATION: General appearance: alert, cooperative, fatigued and  no distress Head: Normocephalic, without obvious abnormality, atraumatic Neck: no adenopathy, no JVD, supple, symmetrical, trachea midline and thyroid not enlarged, symmetric, no tenderness/mass/nodules Lymph nodes: Cervical, supraclavicular, and axillary nodes normal. Resp: clear to auscultation bilaterally Back: symmetric, no curvature. ROM normal. No CVA tenderness. Cardio: regular rate and rhythm, S1, S2 normal, no murmur, click, rub or gallop GI: soft, non-tender; bowel sounds normal; no masses,  no organomegaly Extremities: extremities normal, atraumatic, no cyanosis or edema Neurologic: Alert and oriented X 3, normal strength and tone. Normal symmetric reflexes. Normal coordination and gait  ECOG PERFORMANCE STATUS: 1 - Symptomatic but completely ambulatory  Blood pressure (!) 179/86, pulse 91, temperature 97.8 F (36.6 C), temperature source Temporal, resp. rate 19, height _0  (1.626 m), weight 158 lb (71.7 kg), SpO2 99 %.  LABORATORY DATA: Lab Results  Component Value Date   WBC 8.0 05/21/2019   HGB 11.5 (L) 05/21/2019   HCT 35.5 (L) 05/21/2019   MCV 96.2 05/21/2019   PLT 297 05/21/2019      Chemistry      Component Value Date/Time   NA 141 05/21/2019 1130   K 4.2 05/21/2019 1130   CL 103 05/21/2019 1130   CO2 28 05/21/2019 1130   BUN 16 05/21/2019 1130   CREATININE 0.89 05/21/2019 1130      Component Value Date/Time   CALCIUM 9.8  05/21/2019 1130   ALKPHOS 84 05/21/2019 1130   AST 20 05/21/2019 1130   ALT 19 05/21/2019 1130   BILITOT 0.4 05/21/2019 1130       RADIOGRAPHIC STUDIES: CT Chest Wo Contrast  Result Date: 05/21/2019 CLINICAL DATA:  Non-small-cell lung cancer.  Restaging. EXAM: CT CHEST WITHOUT CONTRAST TECHNIQUE: Multidetector CT imaging of the chest was performed following the standard protocol without IV contrast. COMPARISON:  11/20/2018 FINDINGS: Cardiovascular: The heart size is normal. No substantial pericardial effusion. Coronary artery  calcification is evident. Atherosclerotic calcification is noted in the wall of the thoracic aorta. Mediastinum/Nodes: No mediastinal lymphadenopathy. No evidence for gross hilar lymphadenopathy although assessment is limited by the lack of intravenous contrast on today's study. The esophagus has normal imaging features. Tiny hiatal hernia noted. There is no axillary lymphadenopathy. Lungs/Pleura: Volume loss left hemithorax is compatible with prior left lower lobectomy. Small left pleural effusion is progressive in the interval. New 8 mm pleural nodule identified adjacent to the transverse aorta on image 46 of series 2. New pleural/subpleural nodule seen anteriorly in the left hemithorax measuring 1.5 x 0.7 cm on image 52 of series 2. New pleural nodularity seen anteriorly in the deep costophrenic sulcus measuring 1.5 x 0.9 cm on image 118/series 2. Although difficult to assess on this noncontrast study, there is nodular subtle differential attenuation in the posteromedial aspect of the pleural fluid, in a paraspinal location (see image 63/series 2) raising concern for soft tissue component in the pleural space measuring up to 3.7 x 1.7 cm. Juxta diaphragmatic nodule seen anteriorly on image 139/2 between the anterior left sixth and seventh ribs measures 3.3 x 1.8 cm, increased from 1.4 x 0.8 cm previously. There appears to be a new 3.1 x 1.3 cm nodule in the posterior deep costophrenic sulcus on image 141/2. Irregular 1.4 cm posterior right lower lobe nodule on 106/7 is stable. Upper Abdomen: Unremarkable. Musculoskeletal: No worrisome lytic or sclerotic osseous abnormality. IMPRESSION: 1. Interval progression of disease with numerous new left pleural nodules measuring up to 3.1 x 1.3 cm. Potential 3.7 x 1.7 cm paraspinal left pleural nodule although not well seen due to the surrounding pleural fluid on this noncontrast study. 2. Interval progression of small left pleural effusion. 3. 1.4 cm irregular posterior  right lower lobe pulmonary nodule is unchanged. 4.  Aortic Atherosclerois (ICD10-170.0) Electronically Signed   By: Misty Stanley M.D.   On: 05/21/2019 13:48    ASSESSMENT AND PLAN: This is a very pleasant 69 years old white female with a stage IIa non-small cell lung cancer, adenocarcinoma with positive EGFR mutation in exon 21 (L858R) status post left lower lobectomy with lymph node dissection under the care of Dr. Roxan Hockey. The patient is currently on observation.  She declined to proceed with adjuvant systemic chemotherapy or targeted therapy. She has been in observation since that time and she is doing fine except for intermittent left lower rib cage pain as well as fatigue. She had repeat CT scan of the chest performed earlier today. I personally and independently reviewed the scan images and discussed the result and showed the images to the patient and her husband.  Her scan showed some suspicious pleural-based mass and nodularity in the left lung concerning for disease recurrence. I recommended for the patient to have a PET scan for confirmation of these findings and if it showed hypermetabolic activity in any of this lesion, will consider the patient for repeat biopsy for tissue confirmation. I will arrange for the  patient to come back for follow-up visit after her PET scan and biopsy if needed. She was advised to call immediately if she has any concerning symptoms in the interval. The patient voices understanding of current disease status and treatment options and is in agreement with the current care plan. All questions were answered. The patient knows to call the clinic with any problems, questions or concerns. We can certainly see the patient much sooner if necessary.   Disclaimer: This note was dictated with voice recognition software. Similar sounding words can inadvertently be transcribed and may not be corrected upon review.

## 2019-05-21 NOTE — Telephone Encounter (Signed)
Ct scan report called. Given to [place].

## 2019-05-21 NOTE — Progress Notes (Signed)
[address]       [place] [address]             [phone]    HPI: Mrs. 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  I did a thoracoscopic left lower lobectomy on 04/25/2018.  Her postoperative course was complicated by prolonged air leak requiring endobronchial valve placement.  Those were removed about 6 weeks later.  She saw Dr. [name].  She opted not to participate in a clinical trial for targeted therapy and declined adjuvant chemotherapy.  She has been feeling well.  She says that she has an occasional pain in her left rib cage.  She has been walking over a mile a day when the weather is allowed.  She has noticed coughing more recently.   Current Outpatient Medications  Medication Sig Dispense Refill  . Cholecalciferol (VITAMIN D3) 50 MCG (2000 UT) TABS Take 4,000 Units by mouth daily.    . Coenzyme Q10 (COQ10) 200 MG CAPS Take 200 mg by mouth daily.    Marland Kitchen diltiazem (TIAZAC) 180 MG 24 hr capsule Take 180 mg by mouth daily.    Marland Kitchen EPINEPHrine (EPIPEN 2-PAK) 0.3 mg/0.3 mL IJ SOAJ injection Inject 0.3 mg into the muscle once.    . Levomefolate Glucosamine (METHYLFOLATE PO) Place 800 mcg under the tongue every other day.    . Multiple Vitamins-Minerals (EMERGEN-C IMMUNE PO) Take 1 packet by mouth daily as needed (for immune system).    Marland Kitchen neomycin-polymyxin-hydrocortisone (CORTISPORIN) OTIC solution Place 4 drops into both ears 3 (three) times daily.    . Olopatadine HCl (PATANASE) 0.6 % SOLN Place 1-2 sprays into the nose 2 (two) times daily as needed (for congestion).     . Omega-3 1000 MG CAPS Take 2,000 mg by mouth daily.     . Riboflavin 100 MG CAPS Take 200 mg by mouth every other day.     . rizatriptan (MAXALT) 10 MG tablet Take  5 mg by mouth as needed for migraine. May repeat in 2 hours if needed     . rosuvastatin (CRESTOR) 5 MG tablet Take 5 mg by mouth daily.    . sucralfate (CARAFATE) 1 g tablet Take 1 g by mouth See admin instructions. Crush and drink 1 g by mouth 2-3 times daily    . vitamin B-12 (CYANOCOBALAMIN) 1000 MCG tablet Take 1,000 mcg by mouth every other day.    Marland Kitchen acetaminophen (TYLENOL) 160 MG/5ML solution Take 31.3 mLs (1,000 mg total) by mouth every 6 (six) hours as needed for moderate pain. 120 mL 0  . diltiazem (DILACOR XR) 120 MG 24 hr capsule Take 120 mg by mouth daily. Takes 2 hours after Crestor.     No current facility-administered medications for this visit.    Physical Exam BP 138/76 (BP Location: Right Arm, Patient Position: Sitting, Cuff Size: Normal)   Pulse 91   Temp 97.7 F (36.5 C)   Resp 16   Ht 5\' 4"  (1.626 m)   Wt 155 lb (70.3 kg)   SpO2 95% Comment: RA  BMI 26.16 kg/m  69 year old woman in no acute distress Alert and oriented x3 with no focal deficits Cardiac regular rate and rhythm Lungs diminished breath sounds at left base but otherwise clear  Diagnostic Tests: CT CHEST  WITHOUT CONTRAST  TECHNIQUE: Multidetector CT imaging of the chest was performed following the standard protocol without IV contrast.  COMPARISON:  11/20/2018  FINDINGS: Cardiovascular: The heart size is normal. No substantial pericardial effusion. Coronary artery calcification is evident. Atherosclerotic calcification is noted in the wall of the thoracic aorta.  Mediastinum/Nodes: No mediastinal lymphadenopathy. No evidence for gross hilar lymphadenopathy although assessment is limited by the lack of intravenous contrast on today's study. The esophagus has normal imaging features. Tiny hiatal hernia noted. There is no axillary lymphadenopathy.  Lungs/Pleura: Volume loss left hemithorax is compatible with prior left lower lobectomy. Small left pleural effusion is progressive in the  interval.  New 8 mm pleural nodule identified adjacent to the transverse aorta on image 46 of series 2.  New pleural/subpleural nodule seen anteriorly in the left hemithorax measuring 1.5 x 0.7 cm on image 52 of series 2.  New pleural nodularity seen anteriorly in the deep costophrenic sulcus measuring 1.5 x 0.9 cm on image 118/series 2.  Although difficult to assess on this noncontrast study, there is nodular subtle differential attenuation in the posteromedial aspect of the pleural fluid, in a paraspinal location (see image 63/series 2) raising concern for soft tissue component in the pleural space measuring up to 3.7 x 1.7 cm.  Juxta diaphragmatic nodule seen anteriorly on image 139/2 between the anterior left sixth and seventh ribs measures 3.3 x 1.8 cm, increased from 1.4 x 0.8 cm previously.  There appears to be a new 3.1 x 1.3 cm nodule in the posterior deep costophrenic sulcus on image 141/2.  Irregular 1.4 cm posterior right lower lobe nodule on 106/7 is stable.  Upper Abdomen: Unremarkable.  Musculoskeletal: No worrisome lytic or sclerotic osseous abnormality.  IMPRESSION: 1. Interval progression of disease with numerous new left pleural nodules measuring up to 3.1 x 1.3 cm. Potential 3.7 x 1.7 cm paraspinal left pleural nodule although not well seen due to the surrounding pleural fluid on this noncontrast study. 2. Interval progression of small left pleural effusion. 3. 1.4 cm irregular posterior right lower lobe pulmonary nodule is unchanged. 4.  Aortic Atherosclerois (ICD10-170.0)   Electronically Signed   By: Misty Stanley M.D.   On: 05/21/2019 13:48 I personally reviewed the CT images and concur with the findings noted above  Impression: Bernard Donahoo is a 69 year old non-smoker who presented initially with hemoptysis about a year ago.  Dr. Lamonte Sakai did a navigational bronchoscopy and diagnosed adenocarcinoma.  I did a thoracoscopic left lower  lobectomy.  Pathologic stage was 2A (T2, N0).  She saw Dr. [name] postoperatively.  She declined adjuvant chemotherapy as well as a clinical trial of Tarceva.  She returned today for a 1 year follow-up visit.  Unfortunately her CT scan shows multiple nodules in the left chest as well as a small pleural effusion.  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  Dr. [name] is ordered a PET CT.  That will help guide diagnostic work-up and determine where best to biopsy.  Plan: PET and follow-up with Dr. [name] as scheduled I will schedule her for an appointment in 6 months but suspect that we will see her sooner than that.  [name], MD [facility] [phone]

## 2019-05-21 NOTE — Progress Notes (Signed)
[name] had an oncology appointment just before her appointment with Dr. Roxan Hockey.  She didn't want any VS, WEIGHT, etc. done since "it would just be a repeat".  I did chart those results but did get an O2 sat. for this visit.

## 2019-05-24 ENCOUNTER — Other Ambulatory Visit: Payer: Medicare Other

## 2019-05-27 ENCOUNTER — Ambulatory Visit: Payer: Medicare Other | Admitting: Internal Medicine

## 2019-05-29 ENCOUNTER — Encounter (HOSPITAL_COMMUNITY)
Admission: RE | Admit: 2019-05-29 | Discharge: 2019-05-29 | Disposition: A | Discharge status: Home / Self Care | Payer: Medicare Other | Source: Ambulatory Visit | Attending: Internal Medicine | Admitting: Internal Medicine

## 2019-05-29 ENCOUNTER — Other Ambulatory Visit: Payer: Self-pay

## 2019-05-29 DIAGNOSIS — Z902 Acquired absence of lung [part of]: Secondary | ICD-10-CM | POA: Insufficient documentation

## 2019-05-29 DIAGNOSIS — K5730 Diverticulosis of large intestine without perforation or abscess without bleeding: Secondary | ICD-10-CM | POA: Insufficient documentation

## 2019-05-29 DIAGNOSIS — C3490 Malignant neoplasm of unspecified part of unspecified bronchus or lung: Secondary | ICD-10-CM | POA: Insufficient documentation

## 2019-05-29 DIAGNOSIS — R911 Solitary pulmonary nodule: Secondary | ICD-10-CM | POA: Insufficient documentation

## 2019-05-29 DIAGNOSIS — I700 Atherosclerosis of aorta: Secondary | ICD-10-CM | POA: Insufficient documentation

## 2019-05-29 DIAGNOSIS — I2510 Atherosclerotic heart disease of native coronary artery without angina pectoris: Secondary | ICD-10-CM | POA: Insufficient documentation

## 2019-05-29 DIAGNOSIS — J90 Pleural effusion, not elsewhere classified: Secondary | ICD-10-CM | POA: Insufficient documentation

## 2019-05-29 LAB — GLUCOSE, CAPILLARY: Glucose-Capillary: 127 mg/dL — ABNORMAL HIGH (ref 70–99)

## 2019-05-29 MED ORDER — FLUDEOXYGLUCOSE F - 18 (FDG) INJECTION
7.5800 | Freq: Once | INTRAVENOUS | Status: AC
  Administered 2019-05-29: 11:00:00 7.58 via INTRAVENOUS

## 2019-05-30 ENCOUNTER — Other Ambulatory Visit: Payer: Self-pay | Admitting: Internal Medicine

## 2019-05-30 ENCOUNTER — Telehealth: Payer: Self-pay | Admitting: *Deleted

## 2019-05-30 DIAGNOSIS — C3492 Malignant neoplasm of unspecified part of left bronchus or lung: Secondary | ICD-10-CM

## 2019-05-30 NOTE — Telephone Encounter (Signed)
Received call from pt asking about PET results.  Reviewed chart & results seen.  Informed pt that message would be sent to Dr [name]. Informed him & he is ordering BX & would like to see her couple of days after bx. Informed pt of process & to expect call from [employer].

## 2019-05-31 ENCOUNTER — Encounter (HOSPITAL_COMMUNITY): Payer: Self-pay

## 2019-05-31 NOTE — Progress Notes (Unsigned)
[name]. [name]" Female, 69 y.o., [birth year] MRN:  [id number] Phone:  [phone] [name] PCP:  Donald Prose, MD Primary Cvg:  Medicare/Medicare Part A And B Next Appt With Radiology ([facility]-CT 3) 06/06/2019 at 11:00 AM  RE: Biospy Received: Yesterday Message Contents  Corrie Mckusick, DO  [facility]ers E      OK for CT guided biopsy of left pleural mass.   PET fused images 70/212   [name]   Previous Messages  ----- Message -----  From: [name]  Sent: 05/30/2019  4:31 PM EST  To: [name]  Subject: Biospy                      Procedure Requested: CT Biopsy    Reason for Procedure: recurrent pleural-based mass    Provider Requesting: [name]  Provider Telephone: [phone]    Other Info: Rad Exams in Epic

## 2019-06-03 ENCOUNTER — Telehealth: Payer: Self-pay | Admitting: Internal Medicine

## 2019-06-03 ENCOUNTER — Telehealth: Payer: Self-pay | Admitting: Medical Oncology

## 2019-06-03 NOTE — Telephone Encounter (Signed)
BX on 2/18. When does Mohamed want to see me ?

## 2019-06-03 NOTE — Telephone Encounter (Signed)
Scheduled apt per 2/11 sch message - pt aware of appt date and time

## 2019-06-03 NOTE — Telephone Encounter (Signed)
She can see me or Cassie on Monday or Tuesday next week.  Thank you.

## 2019-06-04 ENCOUNTER — Other Ambulatory Visit: Payer: Self-pay | Admitting: Radiology

## 2019-06-04 ENCOUNTER — Ambulatory Visit (HOSPITAL_COMMUNITY): Payer: Medicare Other

## 2019-06-05 ENCOUNTER — Other Ambulatory Visit: Payer: Self-pay | Admitting: Student

## 2019-06-05 ENCOUNTER — Other Ambulatory Visit: Payer: Self-pay | Admitting: Radiology

## 2019-06-06 ENCOUNTER — Telehealth: Payer: Self-pay | Admitting: *Deleted

## 2019-06-06 ENCOUNTER — Encounter: Payer: Self-pay | Admitting: *Deleted

## 2019-06-06 ENCOUNTER — Other Ambulatory Visit: Payer: Self-pay | Admitting: Radiology

## 2019-06-06 ENCOUNTER — Ambulatory Visit (HOSPITAL_COMMUNITY): Admission: RE | Admit: 2019-06-06 | Payer: Medicare Other | Source: Ambulatory Visit

## 2019-06-06 NOTE — Telephone Encounter (Signed)
Pt called to inform nurse that she has rescheduled Lung Bx to next Tues  2/23 due to bad weather today.  Pt would like to reschedule visit appt with Dr. [name] to another date.  Pt did a rapid COVID test today.  Schedule message sent. Pt's    Phone    [phone].

## 2019-06-06 NOTE — Progress Notes (Signed)
Oncology Nurse Navigator Documentation  Oncology Nurse Navigator Flowsheets 03/27/2019  Abnormal Finding Date -  Confirmed Diagnosis Date -  Expected Surgery Date -  Navigation Complete Date: 03/27/2019  Reason Not Navigating Patient: Other/I received a message from nurse. I updated Dr. [name] and he would like to see her after bx.  The first appt available is on 3/2 with PA.  I updated scheduling team to call and schedule.    Navigator Location -  Referral Date to RadOnc/MedOnc -  Navigator Encounter Type -  Telephone -  Multidisiplinary Clinic Date -  Multidisiplinary Clinic Type -  Treatment Initiated Date -  Patient Visit Type -  Treatment Phase -  Barriers/Navigation Needs -  Education -  Interventions -  Acuity -  Coordination of Care -  Education Method -  Time Spent with Patient -

## 2019-06-06 NOTE — Telephone Encounter (Signed)
Spoke with pt and instructed she would need another COVID test on Monday ( within 72 hrs ) prior to Lung Bx on Tuesday as per protocol.

## 2019-06-07 ENCOUNTER — Telehealth: Payer: Self-pay | Admitting: Internal Medicine

## 2019-06-07 NOTE — Telephone Encounter (Signed)
Scheduled apt per 2/19 sch message -pt scheduled appt - pt is aware

## 2019-06-11 ENCOUNTER — Ambulatory Visit (HOSPITAL_COMMUNITY)
Admission: RE | Admit: 2019-06-11 | Discharge: 2019-06-11 | Disposition: A | Discharge status: Home / Self Care | Payer: Medicare Other | Source: Ambulatory Visit | Attending: Internal Medicine | Admitting: Internal Medicine

## 2019-06-11 ENCOUNTER — Encounter: Payer: Self-pay | Admitting: Internal Medicine

## 2019-06-11 ENCOUNTER — Encounter (HOSPITAL_COMMUNITY): Payer: Self-pay

## 2019-06-11 ENCOUNTER — Ambulatory Visit: Payer: Medicare Other | Admitting: Internal Medicine

## 2019-06-11 ENCOUNTER — Other Ambulatory Visit: Payer: Self-pay

## 2019-06-11 DIAGNOSIS — E559 Vitamin D deficiency, unspecified: Secondary | ICD-10-CM | POA: Insufficient documentation

## 2019-06-11 DIAGNOSIS — I10 Essential (primary) hypertension: Secondary | ICD-10-CM | POA: Insufficient documentation

## 2019-06-11 DIAGNOSIS — Z79899 Other long term (current) drug therapy: Secondary | ICD-10-CM | POA: Insufficient documentation

## 2019-06-11 DIAGNOSIS — D649 Anemia, unspecified: Secondary | ICD-10-CM | POA: Insufficient documentation

## 2019-06-11 DIAGNOSIS — J90 Pleural effusion, not elsewhere classified: Secondary | ICD-10-CM | POA: Diagnosis not present

## 2019-06-11 DIAGNOSIS — C3492 Malignant neoplasm of unspecified part of left bronchus or lung: Secondary | ICD-10-CM | POA: Diagnosis not present

## 2019-06-11 DIAGNOSIS — E785 Hyperlipidemia, unspecified: Secondary | ICD-10-CM | POA: Diagnosis not present

## 2019-06-11 DIAGNOSIS — Z85118 Personal history of other malignant neoplasm of bronchus and lung: Secondary | ICD-10-CM | POA: Insufficient documentation

## 2019-06-11 LAB — CBC
HCT: 36.1 % (ref 36.0–46.0)
Hemoglobin: 11.6 g/dL — ABNORMAL LOW (ref 12.0–15.0)
MCH: 30.9 pg (ref 26.0–34.0)
MCHC: 32.1 g/dL (ref 30.0–36.0)
MCV: 96.3 fL (ref 80.0–100.0)
Platelets: 292 10*3/uL (ref 150–400)
RBC: 3.75 MIL/uL — ABNORMAL LOW (ref 3.87–5.11)
RDW: 12 % (ref 11.5–15.5)
WBC: 7 10*3/uL (ref 4.0–10.5)
nRBC: 0 % (ref 0.0–0.2)

## 2019-06-11 LAB — PROTIME-INR
INR: 0.9 (ref 0.8–1.2)
Prothrombin Time: 12.5 seconds (ref 11.4–15.2)

## 2019-06-11 MED ORDER — MORPHINE SULFATE (PF) 2 MG/ML IV SOLN
INTRAVENOUS | Status: AC
  Filled 2019-06-11: qty 2

## 2019-06-11 MED ORDER — GELATIN ABSORBABLE 12-7 MM EX MISC
CUTANEOUS | Status: AC
  Filled 2019-06-11: qty 1

## 2019-06-11 MED ORDER — MIDAZOLAM HCL 2 MG/2ML IJ SOLN
INTRAMUSCULAR | Status: AC | PRN
  Administered 2019-06-11: 1 mg via INTRAVENOUS
  Administered 2019-06-11: 0.5 mg via INTRAVENOUS
  Administered 2019-06-11: 1 mg via INTRAVENOUS

## 2019-06-11 MED ORDER — MIDAZOLAM HCL 2 MG/2ML IJ SOLN
INTRAMUSCULAR | Status: AC
  Filled 2019-06-11: qty 4

## 2019-06-11 MED ORDER — SODIUM CHLORIDE 0.9 % IV SOLN: INTRAVENOUS | Status: DC

## 2019-06-11 NOTE — Discharge Instructions (Signed)
Lung Biopsy, Care After This sheet gives you information about how to care for yourself after your procedure. Your health care provider may also give you more specific instructions depending on the type of biopsy you had. If you have problems or questions, contact your health care provider. What can I expect after the procedure? After the procedure, it is common to have:  A cough.  A sore throat.  Pain where a needle, bronchoscope, or incision was used to collect a biopsy sample (biopsy site). Follow these instructions at home: Medicines  Take over-the-counter and prescription medicines only as told by your health care provider.  Do not drink alcohol if your health care provider tells you not to drink.  Ask your health care provider if the medicine prescribed to you: ? Requires you to avoid driving or using heavy machinery. ? Can cause constipation. You may need to take these actions to prevent or treat constipation:  Drink enough fluid to keep your urine pale yellow.  Take over-the-counter or prescription medicines.  Eat foods that are high in fiber, such as beans, whole grains, and fresh fruits and vegetables.  Limit foods that are high in fat and processed sugars, such as fried or sweet foods.  Do not drive for 24 hours if you were given a sedative. Biopsy site care   Follow instructions from your health care provider about how to take care of your biopsy site. Make sure you: ? Wash your hands with soap and water before and after you change your bandage (dressing). If soap and water are not available, use hand sanitizer. ? Remove your dressing as told by your health care provider. In 24 hours  Do not take baths, swim, or use a hot tub until your health care provider approves. Ask your health care provider if you may take showers. You may only be allowed to take sponge baths. You may shower after dressing is removed in 24 hours  Check your biopsy site every day for signs of  infection. Check for: ? Redness, swelling, or more pain. ? Fluid or blood. ? Warmth. ? Pus or a bad smell. General instructions  Return to your normal activities as told by your health care provider. Ask your health care provider what activities are safe for you.  It is up to you to get the results of your procedure. Ask your health care provider, or the department that is doing the procedure, when your results will be ready.  Keep all follow-up visits as told by your health care provider. This is important. Contact a health care provider if:  You have a fever.  You have redness, swelling, or more pain around your biopsy site.  You have fluid or blood coming from your biopsy site.  Your biopsy site feels warm to the touch.  You have pus or a bad smell coming from your biopsy site.  You have pain that does not get better with medicine. Get help right away if:  You cough up blood.  You have trouble breathing.  You have chest pain.  You lose consciousness. Summary  After the procedure, it is common to have a sore throat and a cough.  Return to your normal activities as told by your health care provider. Ask your health care provider what activities are safe for you.  Take over-the-counter and prescription medicines only as told by your health care provider.  Report any unusual symptoms to your health care provider. This information is not intended to  replace advice given to you by your health care provider. Make sure you discuss any questions you have with your health care provider. Document Revised: 05/09/2018 Document Reviewed: 05/03/2016 Elsevier Patient Education  [place].  Moderate Conscious Sedation, Adult, Care After These instructions provide you with information about caring for yourself after your procedure. Your health care provider may also give you more specific instructions. Your treatment has been planned according to current medical practices,  but problems sometimes occur. Call your health care provider if you have any problems or questions after your procedure. What can I expect after the procedure? After your procedure, it is common:  To feel sleepy for several hours.  To feel clumsy and have poor balance for several hours.  To have poor judgment for several hours.  To vomit if you eat too soon. Follow these instructions at home: For at least 24 hours after the procedure:   Do not: ? Participate in activities where you could fall or become injured. ? Drive. ? Use heavy machinery. ? Drink alcohol. ? Take sleeping pills or medicines that cause drowsiness. ? Make important decisions or sign legal documents. ? Take care of children on your own.  Rest. Eating and drinking  Follow the diet recommended by your health care provider.  If you vomit: ? Drink water, juice, or soup when you can drink without vomiting. ? Make sure you have little or no nausea before eating solid foods. General instructions  Have a responsible adult stay with you until you are awake and alert.  Take over-the-counter and prescription medicines only as told by your health care provider.  If you smoke, do not smoke without supervision.  Keep all follow-up visits as told by your health care provider. This is important. Contact a health care provider if:  You keep feeling nauseous or you keep vomiting.  You feel light-headed.  You develop a rash.  You have a fever. Get help right away if:  You have trouble breathing. This information is not intended to replace advice given to you by your health care provider. Make sure you discuss any questions you have with your health care provider. Document Revised: 03/17/2017 Document Reviewed: 07/25/2015 Elsevier Patient Education  2020 [employer].

## 2019-06-11 NOTE — H&P (Signed)
Chief Complaint: Patient was seen in consultation today for left pleural based lesion bx at the request of [facility]  Referring Physician(s): Mohamed,Mohamed  Supervising Physician: Aletta Edouard  Patient Status: [facility] - Out-pt  History of Present Illness: [name] is a 69 y.o. female   Hx Lung Ca Left low lobe lobectomy and LN dissection Jan 05/2018 with Dr Roxan Hockey  05/21/19 follow up CT: IMPRESSION: 1. Interval progression of disease with numerous new left pleural nodules measuring up to 3.1 x 1.3 cm. Potential 3.7 x 1.7 cm paraspinal left pleural nodule although not well seen due to the surrounding pleural fluid on this noncontrast study. 2. Interval progression of small left pleural effusion. 3. 1.4 cm irregular posterior right lower lobe pulmonary nodule is unchanged. 4.  Aortic Atherosclerois (ICD10-170.0)  PET 05/29/19: IMPRESSION: 1. Hypermetabolic pleural-based tumors on the left compatible with active malignancy. Hypermetabolic effusion compatible with malignant effusion on the left. 2. The right lower lobe pulmonary nodule has low-grade metabolic activity, maximum SUV of 2.0, nonspecific for inflammatory versus low-grade malignant activity. 3. Mildly hypermetabolic left axillary lymph node 0.7 cm in long axis, reactive versus malignant, merit surveillance. 4. Hypermetabolic brown fat in posterior neck and in the chest, benign. 5. SIRT other Aortic Atherosclerosis (ICD10-I70.0). Coronary atherosclerosis. Left lower lobectomy. 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 Anemia   . Atherosclerosis   . GERD  (gastroesophageal reflux disease)   . Hemoptysis   . Hyperlipidemia   . Hypertension   . IBS (irritable bowel syndrome)   . Lung mass   . Migraines   . Spondylosis   . Vitamin D deficiency     Past Surgical History:  Procedure Laterality Date  . BROW LIFT AND BLEPHAROPLASTY  03/15/2013  . CATARACT EXTRACTION, BILATERAL  06/23/2014  . CHEST TUBE INSERTION Left 05/10/2018   Procedure: CHEST TUBE INSERTION;  Surgeon: [name], MD;  Location: [place];  Service: Thoracic;  Laterality: Left;  . COLONOSCOPY  05/2014  . ESOPHAGOGASTRODUODENOSCOPY  2019  . EYE SURGERY    . IR THORACENTESIS ASP PLEURAL SPACE W/IMG GUIDE  05/24/2018  . IR THORACENTESIS ASP PLEURAL SPACE W/IMG GUIDE  06/14/2018  . TONSILLECTOMY    . VIDEO ASSISTED THORACOSCOPY (VATS)/ LOBECTOMY Left 04/25/2018   Procedure: VIDEO ASSISTED THORACOSCOPY (VATS)LEFT LOWER LOBECTOMY, NODE DISSECTION;  Surgeon: [name], MD;  Location: [facility];  Service: Thoracic;  Laterality: Left;  Marland Kitchen VIDEO BRONCHOSCOPY WITH ENDOBRONCHIAL NAVIGATION N/A 03/07/2018   Procedure: VIDEO BRONCHOSCOPY WITH ENDOBRONCHIAL NAVIGATION;  Surgeon: Collene Gobble, MD;  Location: [facility] OR;  Service: Thoracic;  Laterality: N/A;  . VIDEO BRONCHOSCOPY WITH INSERTION OF INTERBRONCHIAL VALVE (IBV) N/A 05/10/2018   Procedure: VIDEO BRONCHOSCOPY WITH INSERTION OF INTERBRONCHIAL VALVE (IBV);  Surgeon: [name], MD;  Location: [place];  Service: Thoracic;  Laterality: N/A;  . VIDEO BRONCHOSCOPY WITH INSERTION OF INTERBRONCHIAL VALVE (IBV) N/A 06/13/2018   Procedure: VIDEO BRONCHOSCOPY WITH REMOVAL OF INTERBRONCHIAL VALVE (IBV) x 2;  Surgeon: [name], MD;  Location: [facility] OR;  Service: Thoracic;  Laterality: N/A;    Allergies: Avocado, Influenza vaccines, Lisinopril, Metoprolol, Nexium [esomeprazole magnesium], Penicillins, Pork-derived products, Proton pump inhibitors, Sulfites,  Wasp venom protein, Zantac [ranitidine hcl], Cefaclor,  Chlorhexidine, Chloroxine, Ciprofloxacin hcl, Clarithromycin, Gatifloxacin, Other, Sulfa antibiotics, Sulfamethoxazole-trimethoprim, Eggs or egg-derived products, Aspartame, Chocolate, Dilaudid [hydromorphone hcl], Fentanyl, Ivp dye [iodinated diagnostic agents], Ketorolac, Lactase, Milk-related compounds, Molds & smuts, Oxycodone, Prednisolone, Shellfish allergy, and Soap  Medications: Prior to Admission medications   Medication Sig Start Date End Date Taking? Authorizing Provider  Carboxymethylcellul-Glycerin (LUBRICATING EYE DROPS OP) Place 1 drop into both eyes daily as needed (dry eyes).   Yes [provider]  Cholecalciferol (VITAMIN D3) LIQD Take 4,000 Units by mouth daily.   Yes [provider]  Coenzyme Q10 (COQ-10) 100 MG CAPS Take 200 mg by mouth daily.   Yes [provider]  diltiazem (TIAZAC) 180 MG 24 hr capsule Take 180 mg by mouth daily in the afternoon.  04/08/19  Yes [provider]  EPINEPHrine (EPIPEN 2-PAK) 0.3 mg/0.3 mL IJ SOAJ injection Inject 0.3 mg into the muscle as needed for anaphylaxis.    Yes [provider]  Omega-3 Fatty Acids (FISH OIL PO) Take 2 capsules by mouth daily.   Yes [provider]  rizatriptan (MAXALT) 10 MG tablet Take 5 mg by mouth as needed for migraine. May repeat in 2 hours if needed    Yes [provider]  rosuvastatin (CRESTOR) 5 MG tablet Take 5 mg by mouth daily.   Yes [provider]  sucralfate (CARAFATE) 1 g tablet Take 1 g by mouth See admin instructions. Crush and drink 1 g by mouth 2-3 times daily   Yes [provider]  vitamin B-12 (CYANOCOBALAMIN) 1000 MCG tablet Take 1,000 mcg by mouth daily.    Yes [provider]     Family History  Problem Relation Age of Onset  . Dementia Mother   . Hyperlipidemia Mother   . Hypertension Mother   . Thyroid disease Mother   . Irritable bowel syndrome Mother   . Aortic stenosis Mother   . Cancer Father     . Lung cancer Sister     Social History   Socioeconomic History  . Marital status: Married    Spouse name: Not on file  . Number of children: Not on file  . Years of education: Not on file  . Highest education level: Not on file  Occupational History  . Not on file  Tobacco Use  . Smoking status: Never Smoker  . Smokeless tobacco: Never Used  Substance and Sexual Activity  . Alcohol use: Yes    Alcohol/week: 4.0 standard drinks    Types: 4 Glasses of wine per week    Comment: a week  . Drug use: Never  . Sexual activity: Not on file  Other Topics Concern  . Not on file  Social History Narrative  . Not on file   Social Determinants of Health   Financial Resource Strain:   . Difficulty of Paying Living Expenses: Not on file  Food Insecurity:   . Worried About Charity fundraiser in the Last Year: Not on file  . Ran Out of Food in the Last Year: Not on file  Transportation Needs:   . Lack of Transportation (Medical): Not on file  . Lack of Transportation (Non-Medical): Not on file  Physical Activity:   . Days of Exercise per Week: Not on file  . Minutes of Exercise per Session: Not on file  Stress:   . Feeling of Stress : Not on file  Social Connections:   . Frequency of  Communication with Friends and Family: Not on file  . Frequency of Social Gatherings with Friends and Family: Not on file  . Attends Religious Services: Not on file  . Active Member of Clubs or Organizations: Not on file  . Attends Archivist Meetings: Not on file  . Marital Status: Not on file    Review of Systems: A 12 point ROS discussed and pertinent positives are indicated in the HPI above.  All other systems are negative.  Review of Systems  Constitutional: Negative for activity change, fatigue and fever.  Respiratory: Negative for cough and shortness of breath.   Cardiovascular: Negative for chest pain.  Gastrointestinal: Negative for abdominal pain.  Musculoskeletal:  Negative for back pain and gait problem.  Neurological: Negative for weakness.  Psychiatric/Behavioral: Negative for confusion and decreased concentration.    Vital Signs: BP (!) 155/78   Pulse 87   Temp 98.3 F (36.8 C) (Skin)   Resp 16   Ht 5' 4.75" (1.645 m)   Wt 154 lb 9.6 oz (70.1 kg)   SpO2 99%   BMI 25.93 kg/m   Physical Exam Vitals reviewed.  Cardiovascular:     Rate and Rhythm: Normal rate and regular rhythm.     Heart sounds: Normal heart sounds.  Pulmonary:     Breath sounds: Normal breath sounds.  Abdominal:     Palpations: Abdomen is soft.  Musculoskeletal:        General: Normal range of motion.  Skin:    General: Skin is warm and dry.  Neurological:     Mental Status: She is alert and oriented to person, place, and time.  Psychiatric:        Behavior: Behavior normal.        Thought Content: Thought content normal.        Judgment: Judgment normal.     Imaging: CT Chest Wo Contrast  Result Date: 05/21/2019 CLINICAL DATA:  Non-small-cell lung cancer.  Restaging. EXAM: CT CHEST WITHOUT CONTRAST TECHNIQUE: Multidetector CT imaging of the chest was performed following the standard protocol without IV contrast. COMPARISON:  11/20/2018 FINDINGS: Cardiovascular: The heart size is normal. No substantial pericardial effusion. Coronary artery calcification is evident. Atherosclerotic calcification is noted in the wall of the thoracic aorta. Mediastinum/Nodes: No mediastinal lymphadenopathy. No evidence for gross hilar lymphadenopathy although assessment is limited by the lack of intravenous contrast on today's study. The esophagus has normal imaging features. Tiny hiatal hernia noted. There is no axillary lymphadenopathy. Lungs/Pleura: Volume loss left hemithorax is compatible with prior left lower lobectomy. Small left pleural effusion is progressive in the interval. New 8 mm pleural nodule identified adjacent to the transverse aorta on image 46 of series 2. New  pleural/subpleural nodule seen anteriorly in the left hemithorax measuring 1.5 x 0.7 cm on image 52 of series 2. New pleural nodularity seen anteriorly in the deep costophrenic sulcus measuring 1.5 x 0.9 cm on image 118/series 2. Although difficult to assess on this noncontrast study, there is nodular subtle differential attenuation in the posteromedial aspect of the pleural fluid, in a paraspinal location (see image 63/series 2) raising concern for soft tissue component in the pleural space measuring up to 3.7 x 1.7 cm. Juxta diaphragmatic nodule seen anteriorly on image 139/2 between the anterior left sixth and seventh ribs measures 3.3 x 1.8 cm, increased from 1.4 x 0.8 cm previously. There appears to be a new 3.1 x 1.3 cm nodule in the posterior deep costophrenic sulcus on image 141/2.  Irregular 1.4 cm posterior right lower lobe nodule on 106/7 is stable. Upper Abdomen: Unremarkable. Musculoskeletal: No worrisome lytic or sclerotic osseous abnormality. IMPRESSION: 1. Interval progression of disease with numerous new left pleural nodules measuring up to 3.1 x 1.3 cm. Potential 3.7 x 1.7 cm paraspinal left pleural nodule although not well seen due to the surrounding pleural fluid on this noncontrast study. 2. Interval progression of small left pleural effusion. 3. 1.4 cm irregular posterior right lower lobe pulmonary nodule is unchanged. 4.  Aortic Atherosclerois (ICD10-170.0) Electronically Signed   By: Misty Stanley M.D.   On: 05/21/2019 13:48   NM PET Image Restag (PS) Skull Base To Thigh  Result Date: 05/29/2019 CLINICAL DATA:  Subsequent treatment strategy for non-small cell lung cancer. EXAM: NUCLEAR MEDICINE PET SKULL BASE TO THIGH TECHNIQUE: 7.5 mCi F-18 FDG was injected intravenously. Full-ring PET imaging was performed from the skull base to thigh after the radiotracer. CT data was obtained and used for attenuation correction and anatomic localization. Fasting blood glucose: 127 mg/dl COMPARISON:   Multiple exams, including 05/21/2019 chest CT prior PET-CT from 03/21/2018 FINDINGS: Mediastinal blood pool activity: SUV max 2.2 Liver activity: SUV max NA NECK: Mild hypermetabolic brown fat activity along the posterior neck similar to prior. Incidental CT findings: none CHEST: Medial left pleural tumor about the T7 vertebral level measures approximately 4.0 by 1.8 cm on image 69/4 with maximum SUV of 5.4. Other medial pleural densities further inferiorly along the left pleural effusion have maximum SUV up to 4.6. Fluoro pericardial nodularity anteriorly near the cardiac apex has maximum SUV of 2.4, and measures proximally 1.1 cm in short axis. There is hypermetabolic brown fat in the paraspinal regions, supraclavicular regions, and axilla. A 0.7 cm left axillary lymph node on image 54/4 has a maximum SUV of 3.7 The moderate size left pleural effusion has accentuated metabolic activity, maximum SUV of a fluid sample measurement 2.4, suggesting malignant effusion in conjunction with the pleural based masses. Focal distal esophageal activity with maximum SUV 5.3, most likely physiologic in this location, but technically nonspecific. This previously measured 4.8. Left anterior tumor implant along the hemidiaphragm measuring 1.5 cm in short axis on image 109/4 has maximum SUV of 5.6, compatible with malignancy. The right lower lobe pulmonary nodule measuring about 1.0 by 0.8 cm on image 42/8 has a maximum SUV of 2.0. Incidental CT findings: Coronary, aortic arch, and branch vessel atherosclerotic vascular disease. Left lower lobectomy. ABDOMEN/PELVIS: No significant abnormal hypermetabolic activity in this region. Incidental CT findings: Aortoiliac atherosclerotic vascular disease. Sigmoid colon diverticulosis. SKELETON: Activity along the anterior capsular margin of the right hip is thought to be inflammatory. No discrete hypermetabolic skeletal activity is identified. Incidental CT findings: none IMPRESSION: 1.  Hypermetabolic pleural-based tumors on the left compatible with active malignancy. Hypermetabolic effusion compatible with malignant effusion on the left. 2. The right lower lobe pulmonary nodule has low-grade metabolic activity, maximum SUV of 2.0, nonspecific for inflammatory versus low-grade malignant activity. 3. Mildly hypermetabolic left axillary lymph node 0.7 cm in long axis, reactive versus malignant, merit surveillance. 4. Hypermetabolic brown fat in posterior neck and in the chest, benign. 5. SIRT other Aortic Atherosclerosis (ICD10-I70.0). Coronary atherosclerosis. Left lower lobectomy. Sigmoid colon diverticulosis. Electronically Signed   By: Van Clines M.D.   On: 05/29/2019 14:35    Labs:  CBC: Recent Labs    06/12/18 1450 11/20/18 1132 05/21/19 1057  WBC 9.2 9.0 8.0  HGB 11.6* 11.7* 11.5*  HCT 37.9 36.2 35.5*  PLT 351 298 297    COAGS: Recent Labs    06/12/18 1450  INR 0.9  APTT 32    BMP: Recent Labs    06/12/18 1450 11/20/18 1132 05/21/19 1130  NA 137 140 141  K 3.9 4.7 4.2  CL 105 105 103  CO2 _0 GLUCOSE 117* 108* 103*  BUN _1 CALCIUM 9.1 9.9 9.8  CREATININE 0.76 0.83 0.89  GFRNONAA >60 >60 >60  GFRAA >60 >60 >60    LIVER FUNCTION TESTS: Recent Labs    06/12/18 1450 11/20/18 1132 05/21/19 1130  BILITOT 0.8 0.4 0.4  AST _2 ALT _3 ALKPHOS 63 85 84  PROT 6.6 7.2 7.5  ALBUMIN 3.7 4.4 4.5    TUMOR MARKERS: No results for input(s): AFPTM, CEA, CA199, CHROMGRNA in the last 8760 hours.  Assessment and Plan:  Hx Lung Ca 05/2018 New +PET in follow up Possible recurrence Scheduled for biopsy of left pleural based lesion Risks and benefits of CT guided lung nodule biopsy was discussed with the patient including, but not limited to bleeding, hemoptysis, respiratory failure requiring intubation, infection, pneumothorax requiring chest tube placement, stroke from air embolism or even death.  All of the patient's  questions were answered and the patient is agreeable to proceed. Consent signed and in chart.   Thank you for this interesting consult.  I greatly enjoyed meeting [name] and look forward to participating in their care.  A copy of this report was sent to the requesting provider on this date.  Electronically Signed: Lavonia Drafts, PA-C 06/11/2019, 10:15 AM   I spent a total of  30 Minutes   in face to face in clinical consultation, greater than 50% of which was counseling/coordinating care for left pleural based lesion bx

## 2019-06-11 NOTE — Procedures (Signed)
Interventional Radiology Procedure Note  Procedure: CT Guided Biopsy of left pleural mass  Complications: None  Estimated Blood Loss: < 10 mL  Findings: 18 G core biopsy of left pleural mass performed under CT guidance.  Single core samples obtained and sent to Pathology. Due to profuse bleeding from outer trocar needle, could not obtain additional samples. Gelfoam pledgets and Biosentry device used for tract hemostasis.  Venetia Night. Kathlene Cote, M.D Pager:  [phone]

## 2019-06-12 ENCOUNTER — Telehealth: Payer: Self-pay

## 2019-06-12 ENCOUNTER — Telehealth: Payer: Self-pay | Admitting: Radiology

## 2019-06-12 LAB — SURGICAL PATHOLOGY

## 2019-06-12 NOTE — Progress Notes (Signed)
   Left pleural mass biopsy performed in IR 06/11/19  Pt calling today with pain below site of biopsy Waist level from front to back Has had heating pad on area and pain is resolving  Denies SOB; cough or hemoptysis Denies N/V or dizziness "feels a little loopy still from Versed" Denies sign of redness or infection at site Temp 99.4 per pt Pain has subsided at this point  I spoke to Dr Kathlene Cote to report these findings  He relates pt did have some bleeding during bx yesterday  Treated with Gelfoam pledgets and biosentry device for tract hemostasis  Findings: 18 G core biopsy of left pleural mass performed under CT guidance.  Single core samples obtained and sent to Pathology. Due to profuse bleeding from outer trocar needle, could not obtain additional samples. Gelfoam pledgets and Biosentry device used for tract hemostasis.   The pain she is experiencing could be related to procedure event per Dr Kathlene Cote Reassurance to pt that since the pain is subsiding she can continue heating pad and Tylenol as needed. If pain were to increase or develop cough; sob or hemoptysis- she must go to nearest ED for evaluation. She has good understanding and is agreeable  Pt is to see Dr [name] tomorrow Please report to him pain occurrence and status. May need CXR if he feels is appropriate.

## 2019-06-12 NOTE — Telephone Encounter (Signed)
[name] wanted to confirm that it was okay for her to see Dr. [name] tomorrow in company of her husband. Advised that this was fine and her husband would have to go through visitors screening. [name] verbalized understanding.

## 2019-06-13 ENCOUNTER — Encounter: Payer: Self-pay | Admitting: Internal Medicine

## 2019-06-13 ENCOUNTER — Telehealth: Payer: Self-pay | Admitting: Pharmacist

## 2019-06-13 ENCOUNTER — Telehealth: Payer: Self-pay

## 2019-06-13 ENCOUNTER — Inpatient Hospital Stay (HOSPITAL_BASED_OUTPATIENT_CLINIC_OR_DEPARTMENT_OTHER): Payer: Medicare Other | Admitting: Internal Medicine

## 2019-06-13 ENCOUNTER — Other Ambulatory Visit: Payer: Self-pay

## 2019-06-13 ENCOUNTER — Encounter: Payer: Self-pay | Admitting: General Practice

## 2019-06-13 VITALS — BP 179/85 | HR 93 | Temp 98.5°F | Resp 19 | Ht 64.75 in | Wt 158.4 lb

## 2019-06-13 DIAGNOSIS — C3492 Malignant neoplasm of unspecified part of left bronchus or lung: Secondary | ICD-10-CM | POA: Diagnosis not present

## 2019-06-13 DIAGNOSIS — Z5111 Encounter for antineoplastic chemotherapy: Secondary | ICD-10-CM | POA: Diagnosis not present

## 2019-06-13 DIAGNOSIS — Z902 Acquired absence of lung [part of]: Secondary | ICD-10-CM | POA: Diagnosis not present

## 2019-06-13 DIAGNOSIS — J90 Pleural effusion, not elsewhere classified: Secondary | ICD-10-CM

## 2019-06-13 DIAGNOSIS — C3432 Malignant neoplasm of lower lobe, left bronchus or lung: Secondary | ICD-10-CM | POA: Diagnosis not present

## 2019-06-13 DIAGNOSIS — Z7189 Other specified counseling: Secondary | ICD-10-CM

## 2019-06-13 MED ORDER — OSIMERTINIB MESYLATE 80 MG PO TABS: 80.0000 mg | ORAL_TABLET | Freq: Every day | ORAL | 2 refills | Status: DC

## 2019-06-13 NOTE — Patient Instructions (Signed)
Osimertinib oral tablets What is this medicine? Osimertinib (OH sim ER ti nib) is a medicine that targets proteins in cancer cells and stops the cancer cells from growing. It is used to treat non-small cell lung cancer. This medicine may be used for other purposes; ask your health care provider or pharmacist if you have questions. COMMON BRAND NAME(S): Tagrisso What should I tell my health care provider before I take this medicine? They need to know if you have any of these conditions:  heart disease  history of irregular heartbeat  history of low levels of calcium, magnesium, or potassium in the blood  lung or breathing disease, like asthma  QT prolongation  scarring or thickening of the lungs  an unusual or allergic reaction to osimertinib, other medicines, foods, dyes, or preservatives  pregnant or trying to get pregnant  breast-feeding How should I use this medicine? Take osimertinib tablets by mouth with a glass of water. You can take it with or without food. Follow the directions on the prescription label. Take your medicine at regular intervals. Do not take it more often than directed. Do not stop taking except on your doctor's advice. If swallowing is difficult, you can take the tablet by placing your dose in a container with 2 ounces of cool water only. Stir until the tablet is in small pieces. The tablet will not completely dissolve. Do not crush or chew. Drink the water and the tablet pieces right away. Then add 4 to 8 ounces of water to the same container and drink to make sure you take your full dose. Talk to your pediatrician regarding the use of this medicine in children. Special care may be needed. Overdosage: If you think you have taken too much of this medicine contact a poison control center or emergency room at once. NOTE: This medicine is only for you. Do not share this medicine with others. What if I miss a dose? If you miss a dose, do not make up for the missed  dose. Take your next dose at your regular time. Do not take extra or double doses. What may interact with this medicine? Do not take this medicine with any of the following medications:  cisapride  dronedarone  penicillamine  pimozide  thioridazine This medicine may interact with the following medications:  certain medicines for seizures like carbamazepine, phenobarbital, phenytoin  other medicines that prolong the QT interval (cause an abnormal heart rhythm) like dofetilide, ziprasidone  rifampin  rosuvastatin  St. John's Wort  sulfasalazine  topotecan This list may not describe all possible interactions. Give your health care provider a list of all the medicines, herbs, non-prescription drugs, or dietary supplements you use. Also tell them if you smoke, drink alcohol, or use illegal drugs. Some items may interact with your medicine. What should I watch for while using this medicine? Visit your doctor for regular check ups. Report any side effects. Continue your course of treatment unless your doctor tells you to stop. You will need blood work done while you are taking this medicine. Do not become pregnant while taking this medicine or for 6 weeks after the last dose. Males with female partners of reproductive potential should use effective contraception for 4 months after the last dose. Women should inform their doctor if they wish to become pregnant or think they might be pregnant. There is a potential for serious side effects to an unborn child. This medicine may interfere with the ability to have a child for both men  and women. You should talk with your doctor or health care professional if you are concerned about your fertility. Talk to your health care professional or pharmacist for more information. Do not breast-feed an infant while taking this medicine or for 2 weeks after the last dose. This drug may make you feel generally unwell. This is not uncommon, as chemotherapy can  affect healthy cells as well as cancer cells. Report any side effects. Continue your course of treatment even though you feel ill unless your doctor tells you to stop. What side effects may I notice from receiving this medicine? Side effects that you should report to your doctor or health care professional as soon as possible:  allergic reactions like skin rash, itching or hives, swelling of the face, lips, or tongue  red spots on the skin  redness, blistering, peeling, or loosening of the skin, including inside the mouth  signs and symptoms of a dangerous change in heartbeat or heart rhythm like chest pain; dizziness; fast or irregular heartbeat; palpitations; feeling faint or lightheaded, falls; breathing problems  signs and symptoms of increased potassium like muscle weakness; chest pain; or fast, irregular heartbeat  signs and symptoms of low potassium like muscle cramps or muscle pain; chest pain; dizziness; feeling faint or lightheaded, falls; palpitations; breathing problems; or fast, irregular heartbeat  swelling of the legs or ankles  unusually weak or tired Side effects that usually do not require medical attention (report to your doctor or health care professional if they continue or are bothersome):  diarrhea  dry skin  nail changes This list may not describe all possible side effects. Call your doctor for medical advice about side effects. You may report side effects to FDA at 1-800-FDA-1088. Where should I keep my medicine? Keep out of the reach of children. Store at room temperature between 20 and 25 degrees C (68 and 77 degrees F). Throw away any unused medicine after the expiration date. NOTE: This sheet is a summary. It may not cover all possible information. If you have questions about this medicine, talk to your doctor, pharmacist, or health care provider.  2020 Elsevier/Gold Standard (2018-04-09 18:27:35)

## 2019-06-13 NOTE — Progress Notes (Signed)
[place] CSW Progress Notes  Request received to contact patient as she would like to connect w social work services.  Called patient who was on her way home from appointment w oncologist.  Pt would like to have more support specifically in regards to dealing with cancer and the life changes this has brought into her life and her husband's life.  She would like to connect w another person diagnosed with lung cancer, similar to her diagnosis.  CSW will send her information on [facility], [place] and [facility] [place].   Will send information on [facility] and [employer]. Patient encouraging to reach out and schedule time for her to meet individually and/or as a couple w CSWs to discuss adjustment to cancer diagnosis and treatment.  Edwyna Shell, LCSW Clinical Social Worker Phone:  [phone] Cell:  [phone]

## 2019-06-13 NOTE — Progress Notes (Signed)
[place] [phone]   Fax:([phone]  OFFICE PROGRESS NOTE  Donald Prose, MD [place] [address]  DIAGNOSIS: Suspicious recurrent non-small cell lung cancer initially diagnosed as stage IIA (T2b, N0, M0) non-small cell lung cancer, adenocarcinoma diagnosed in November 2019  Molecular studies showed EGFR mutation in exon 21 (L858R)  PRIOR THERAPY: status post left lower lobectomy with lymph node dissection on April 25, 2018 under the care of Dr. Roxan Hockey.  She declined adjuvant therapy  CURRENT THERAPY: Osimertinib (Tagrisso) 80 mg p.o. daily.  This is expected to start in the next few days.  INTERVAL HISTORY: [name] 69 y.o. female returns to the clinic today for follow-up visit accompanied by her husband.  The patient is feeling fine today with no concerning complaints except for pain on the left side of the chest and mild shortness of breath with exertion but no significant cough or hemoptysis.  She denied having any current fever or chills.  She has no nausea, vomiting, diarrhea or constipation.  She denied having any headache or visual changes.  She underwent several studies recently including repeat PET scan as well as CT-guided core biopsy of the pleural-based left lower lobe lung mass by interventional radiology.  The final pathology was consistent with recurrent adenocarcinoma of the lung.  She is here today for evaluation and discussion of her treatment options the patient has a lot of questions today about her condition.  MEDICAL HISTORY: Past Medical History:  Diagnosis Date  . Anemia   . Atherosclerosis   . GERD (gastroesophageal reflux disease)   . Hemoptysis   . Hyperlipidemia   . Hypertension   . IBS (irritable bowel syndrome)   . Lung mass   . Migraines   . Spondylosis   . Vitamin D deficiency     ALLERGIES:  is allergic to avocado; influenza vaccines; lisinopril; metoprolol; nexium  [esomeprazole magnesium]; penicillins; pork-derived products; proton pump inhibitors; sulfites; wasp venom protein; zantac [ranitidine hcl]; cefaclor; chlorhexidine; chloroxine; ciprofloxacin hcl; clarithromycin; gatifloxacin; other; sulfa antibiotics; sulfamethoxazole-trimethoprim; eggs or egg-derived products; aspartame; chocolate; dilaudid [hydromorphone hcl]; fentanyl; ivp dye [iodinated diagnostic agents]; ketorolac; lactase; milk-related compounds; molds & smuts; oxycodone; prednisolone; shellfish allergy; and soap.  MEDICATIONS:  Current Outpatient Medications  Medication Sig Dispense Refill  . Carboxymethylcellul-Glycerin (LUBRICATING EYE DROPS OP) Place 1 drop into both eyes daily as needed (dry eyes).    . Cholecalciferol (VITAMIN D3) LIQD Take 4,000 Units by mouth daily.    . Coenzyme Q10 (COQ-10) 100 MG CAPS Take 200 mg by mouth daily.    Marland Kitchen diltiazem (TIAZAC) 180 MG 24 hr capsule Take 180 mg by mouth daily in the afternoon.     Marland Kitchen EPINEPHrine (EPIPEN 2-PAK) 0.3 mg/0.3 mL IJ SOAJ injection Inject 0.3 mg into the muscle as needed for anaphylaxis.     . Omega-3 Fatty Acids (FISH OIL PO) Take 2 capsules by mouth daily.    . rizatriptan (MAXALT) 10 MG tablet Take 5 mg by mouth as needed for migraine. May repeat in 2 hours if needed     . rosuvastatin (CRESTOR) 5 MG tablet Take 5 mg by mouth daily.    . sucralfate (CARAFATE) 1 g tablet Take 1 g by mouth See admin instructions. Crush and drink 1 g by mouth 2-3 times daily    . vitamin B-12 (CYANOCOBALAMIN) 1000 MCG tablet Take 1,000 mcg by mouth daily.      No current facility-administered  medications for this visit.    SURGICAL HISTORY:  Past Surgical History:  Procedure Laterality Date  . BROW LIFT AND BLEPHAROPLASTY  03/15/2013  . CATARACT EXTRACTION, BILATERAL  06/23/2014  . CHEST TUBE INSERTION Left 05/10/2018   Procedure: CHEST TUBE INSERTION;  Surgeon: [name], MD;  Location: [facility];  Service: Thoracic;  Laterality:  Left;  . COLONOSCOPY  05/2014  . ESOPHAGOGASTRODUODENOSCOPY  2019  . EYE SURGERY    . IR THORACENTESIS ASP PLEURAL SPACE W/IMG GUIDE  05/24/2018  . IR THORACENTESIS ASP PLEURAL SPACE W/IMG GUIDE  06/14/2018  . TONSILLECTOMY    . VIDEO ASSISTED THORACOSCOPY (VATS)/ LOBECTOMY Left 04/25/2018   Procedure: VIDEO ASSISTED THORACOSCOPY (VATS)LEFT LOWER LOBECTOMY, NODE DISSECTION;  Surgeon: [name], MD;  Location: [facility];  Service: Thoracic;  Laterality: Left;  Marland Kitchen VIDEO BRONCHOSCOPY WITH ENDOBRONCHIAL NAVIGATION N/A 03/07/2018   Procedure: VIDEO BRONCHOSCOPY WITH ENDOBRONCHIAL NAVIGATION;  Surgeon: Collene Gobble, MD;  Location: [facility];  Service: Thoracic;  Laterality: N/A;  . VIDEO BRONCHOSCOPY WITH INSERTION OF INTERBRONCHIAL VALVE (IBV) N/A 05/10/2018   Procedure: VIDEO BRONCHOSCOPY WITH INSERTION OF INTERBRONCHIAL VALVE (IBV);  Surgeon: [name], MD;  Location: [facility];  Service: Thoracic;  Laterality: N/A;  . VIDEO BRONCHOSCOPY WITH INSERTION OF INTERBRONCHIAL VALVE (IBV) N/A 06/13/2018   Procedure: VIDEO BRONCHOSCOPY WITH REMOVAL OF INTERBRONCHIAL VALVE (IBV) x 2;  Surgeon: [name], MD;  Location: [place];  Service: Thoracic;  Laterality: N/A;    REVIEW OF SYSTEMS:  Constitutional: positive for fatigue Eyes: negative Ears, nose, mouth, throat, and face: negative Respiratory: positive for dyspnea on exertion and pleurisy/chest pain Cardiovascular: negative Gastrointestinal: negative Genitourinary:negative Integument/breast: negative Hematologic/lymphatic: negative Musculoskeletal:negative Neurological: negative Behavioral/Psych: positive for anxiety Endocrine: negative Allergic/Immunologic: negative   PHYSICAL EXAMINATION: General appearance: alert, cooperative, fatigued and no distress Head: Normocephalic, without obvious abnormality, atraumatic Neck: no adenopathy, no JVD, supple, symmetrical, trachea midline and thyroid not enlarged, symmetric, no  tenderness/mass/nodules Lymph nodes: Cervical, supraclavicular, and axillary nodes normal. Resp: clear to auscultation bilaterally Back: symmetric, no curvature. ROM normal. No CVA tenderness. Cardio: regular rate and rhythm, S1, S2 normal, no murmur, click, rub or gallop GI: soft, non-tender; bowel sounds normal; no masses,  no organomegaly Extremities: extremities normal, atraumatic, no cyanosis or edema Neurologic: Alert and oriented X 3, normal strength and tone. Normal symmetric reflexes. Normal coordination and gait  ECOG PERFORMANCE STATUS: 1 - Symptomatic but completely ambulatory  Blood pressure (!) 179/85, pulse 93, temperature 98.5 F (36.9 C), temperature source Oral, resp. rate 19, height 5' 4.75" (1.645 m), weight 158 lb 6.4 oz (71.8 kg), SpO2 99 %.   LABORATORY DATA: Lab Results  Component Value Date   WBC 7.0 06/11/2019   HGB 11.6 (L) 06/11/2019   HCT 36.1 06/11/2019   MCV 96.3 06/11/2019   PLT 292 06/11/2019      Chemistry      Component Value Date/Time   NA 141 05/21/2019 1130   K 4.2 05/21/2019 1130   CL 103 05/21/2019 1130   CO2 28 05/21/2019 1130   BUN 16 05/21/2019 1130   CREATININE 0.89 05/21/2019 1130      Component Value Date/Time   CALCIUM 9.8 05/21/2019 1130   ALKPHOS 84 05/21/2019 1130   AST 20 05/21/2019 1130   ALT 19 05/21/2019 1130   BILITOT 0.4 05/21/2019 1130       RADIOGRAPHIC STUDIES: CT Chest Wo Contrast  Result Date: 05/21/2019 CLINICAL DATA:  Non-small-cell lung cancer.  Restaging. EXAM: CT CHEST WITHOUT CONTRAST TECHNIQUE: Multidetector CT imaging of the chest was performed following the standard protocol without IV contrast. COMPARISON:  11/20/2018 FINDINGS: Cardiovascular: The heart size is normal. No substantial pericardial effusion. Coronary artery calcification is evident. Atherosclerotic calcification is noted in the wall of the thoracic aorta. Mediastinum/Nodes: No mediastinal lymphadenopathy. No evidence for gross hilar  lymphadenopathy although assessment is limited by the lack of intravenous contrast on today's study. The esophagus has normal imaging features. Tiny hiatal hernia noted. There is no axillary lymphadenopathy. Lungs/Pleura: Volume loss left hemithorax is compatible with prior left lower lobectomy. Small left pleural effusion is progressive in the interval. New 8 mm pleural nodule identified adjacent to the transverse aorta on image 46 of series 2. New pleural/subpleural nodule seen anteriorly in the left hemithorax measuring 1.5 x 0.7 cm on image 52 of series 2. New pleural nodularity seen anteriorly in the deep costophrenic sulcus measuring 1.5 x 0.9 cm on image 118/series 2. Although difficult to assess on this noncontrast study, there is nodular subtle differential attenuation in the posteromedial aspect of the pleural fluid, in a paraspinal location (see image 63/series 2) raising concern for soft tissue component in the pleural space measuring up to 3.7 x 1.7 cm. Juxta diaphragmatic nodule seen anteriorly on image 139/2 between the anterior left sixth and seventh ribs measures 3.3 x 1.8 cm, increased from 1.4 x 0.8 cm previously. There appears to be a new 3.1 x 1.3 cm nodule in the posterior deep costophrenic sulcus on image 141/2. Irregular 1.4 cm posterior right lower lobe nodule on 106/7 is stable. Upper Abdomen: Unremarkable. Musculoskeletal: No worrisome lytic or sclerotic osseous abnormality. IMPRESSION: 1. Interval progression of disease with numerous new left pleural nodules measuring up to 3.1 x 1.3 cm. Potential 3.7 x 1.7 cm paraspinal left pleural nodule although not well seen due to the surrounding pleural fluid on this noncontrast study. 2. Interval progression of small left pleural effusion. 3. 1.4 cm irregular posterior right lower lobe pulmonary nodule is unchanged. 4.  Aortic Atherosclerois (ICD10-170.0) Electronically Signed   By: Misty Stanley M.D.   On: 05/21/2019 13:48   NM PET Image Restag  (PS) Skull Base To Thigh  Result Date: 05/29/2019 CLINICAL DATA:  Subsequent treatment strategy for non-small cell lung cancer. EXAM: NUCLEAR MEDICINE PET SKULL BASE TO THIGH TECHNIQUE: 7.5 mCi F-18 FDG was injected intravenously. Full-ring PET imaging was performed from the skull base to thigh after the radiotracer. CT data was obtained and used for attenuation correction and anatomic localization. Fasting blood glucose: 127 mg/dl COMPARISON:  Multiple exams, including 05/21/2019 chest CT prior PET-CT from 03/21/2018 FINDINGS: Mediastinal blood pool activity: SUV max 2.2 Liver activity: SUV max NA NECK: Mild hypermetabolic brown fat activity along the posterior neck similar to prior. Incidental CT findings: none CHEST: Medial left pleural tumor about the T7 vertebral level measures approximately 4.0 by 1.8 cm on image 69/4 with maximum SUV of 5.4. Other medial pleural densities further inferiorly along the left pleural effusion have maximum SUV up to 4.6. Fluoro pericardial nodularity anteriorly near the cardiac apex has maximum SUV of 2.4, and measures proximally 1.1 cm in short axis. There is hypermetabolic brown fat in the paraspinal regions, supraclavicular regions, and axilla. A 0.7 cm left axillary lymph node on image 54/4 has a maximum SUV of 3.7 The moderate size left pleural effusion has accentuated metabolic activity, maximum SUV of a fluid sample measurement 2.4, suggesting malignant effusion in conjunction with the pleural  based masses. Focal distal esophageal activity with maximum SUV 5.3, most likely physiologic in this location, but technically nonspecific. This previously measured 4.8. Left anterior tumor implant along the hemidiaphragm measuring 1.5 cm in short axis on image 109/4 has maximum SUV of 5.6, compatible with malignancy. The right lower lobe pulmonary nodule measuring about 1.0 by 0.8 cm on image 42/8 has a maximum SUV of 2.0. Incidental CT findings: Coronary, aortic arch, and branch  vessel atherosclerotic vascular disease. Left lower lobectomy. ABDOMEN/PELVIS: No significant abnormal hypermetabolic activity in this region. Incidental CT findings: Aortoiliac atherosclerotic vascular disease. Sigmoid colon diverticulosis. SKELETON: Activity along the anterior capsular margin of the right hip is thought to be inflammatory. No discrete hypermetabolic skeletal activity is identified. Incidental CT findings: none IMPRESSION: 1. Hypermetabolic pleural-based tumors on the left compatible with active malignancy. Hypermetabolic effusion compatible with malignant effusion on the left. 2. The right lower lobe pulmonary nodule has low-grade metabolic activity, maximum SUV of 2.0, nonspecific for inflammatory versus low-grade malignant activity. 3. Mildly hypermetabolic left axillary lymph node 0.7 cm in long axis, reactive versus malignant, merit surveillance. 4. Hypermetabolic brown fat in posterior neck and in the chest, benign. 5. SIRT other Aortic Atherosclerosis (ICD10-I70.0). Coronary atherosclerosis. Left lower lobectomy. Sigmoid colon diverticulosis. Electronically Signed   By: Van Clines M.D.   On: 05/29/2019 14:35   CT Biopsy  Result Date: 06/11/2019 CLINICAL DATA:  History adenocarcinoma of the left lower lobe with imaging evidence of recurrent hypermetabolic pleural masses and associated pleural effusion. The patient presents for biopsy a pleural mass. EXAM: CT GUIDED CORE BIOPSY OF LEFT PLEURAL MASS ANESTHESIA/SEDATION: 2.5 mg IV Versed Total Moderate Sedation Time:  28 minutes. The patient's level of consciousness and physiologic status were continuously monitored during the procedure by Radiology nursing. PROCEDURE: The procedure risks, benefits, and alternatives were explained to the patient. Questions regarding the procedure were encouraged and answered. The patient understands and consents to the procedure. CT was performed in a prone position. The posterior left chest wall was  prepped with chlorhexidine in a sterile fashion, and a sterile drape was applied covering the operative field. A sterile gown and sterile gloves were used for the procedure. Local anesthesia was provided with 1% Lidocaine. Under CT guidance, a 17 gauge needle was advanced to the level of a left medial and posterior lower chest pleural mass. After confirming needle tip position, an 18 gauge core biopsy sample was obtained and submitted in formalin. Gel-Foam pledgets were advanced through the outer needle. A pleural plug was also placed at the pleural entry site utilizing the [facility] device. COMPLICATIONS: None FINDINGS: Multiple left-sided pleural masses are present. The more inferior and medial pleural mass located in a paraspinous location was chosen for biopsy given its position. A single sample yielded solid tissue. There was very brisk active bleeding for emanating from the outer trocar needle after a single core biopsy was obtained and therefore Gel-Foam as well as a pleural plug were utilized in obtaining hemostasis. Further core biopsy samples were not obtained due to the apparent vascular nature of the tumor. IMPRESSION: CT-guided core biopsy of left lower posterior pleural mass. After a single intact core biopsy specimen was obtained, further samples were not obtained due to brisk bleeding from the outer trocar needle. Bleeding was controlled with injection of a Gel-Foam slurry and deposition of a pleural plug. Electronically Signed   By: Aletta Edouard M.D.   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 Roxan Hockey. The patient is currently on observation.  She declined to proceed with adjuvant systemic chemotherapy or targeted therapy. The patient has been in  observation since her surgical resection. Recent CT scan of the chest showed some suspicious pleural-based mass and nodularity in the left lung concerning for disease recurrence. I ordered a PET scan as well as CT-guided core biopsy of the left lower posterior pleural mass by interventional radiology.  The patient is here today for evaluation and discussion of her scan and biopsy results as well as recommendation regarding treatment of her condition. I personally and independently reviewed the PET scan images and discussed the result and showed the images to the patient today. I also discussed with the patient the results of her biopsy which was consistent with recurrent adenocarcinoma of lung primary.  I did send the tissue for molecular studies again. I discussed with the patient her treatment options including palliative care versus palliative treatment with targeted therapy with EGFR tyrosine kinase inhibitor.  The patient is interested in treatment and I recommended for her treatment with Tagrisso 80 mg p.o. daily.  I discussed with the patient the adverse effect of this treatment including but not limited to skin rash, diarrhea, interstitial lung disease, cardiac abnormality as well as dry skin and paronychia.  I also gave the patient handout about this medication.  She will also has consultation with the pharmacist for oral oncolytics. She is expected to start the first dose of this treatment in few days.  The patient requested referral to a local medical oncologist so she can receive the first dose of her treatment in their office to be monitored.  She is concerned about developing hypersensitivity reaction to the drug.  I referred her to Dr. James Ivanoff at [facility] in [place]. The patient also request evaluation by the social worker at the cancer center and referral was made. She had an EKG performed earlier today that was unremarkable. I will arrange  for the patient to come back for follow-up visit in around 3 weeks for evaluation and repeat blood work but if she establish care with the medical oncologist in [place], she may have her visit done there.  She would like to continue evaluation and follow-up here especially with repeating imaging studies and decision making. The patient has a lot of questions today and I answered them completely to her satisfaction.  She was accompanied by her husband who was also taking notes. She was advised to call immediately if she has any concerning symptoms in the interval. The total time spent in the appointment was 60 minutes.  The patient voices understanding of current disease status and treatment options and is in agreement with the current care plan. All questions were answered. The patient knows to call the clinic with any problems, questions or concerns. We can certainly see the patient much sooner if necessary.   Disclaimer: This note was dictated with voice recognition software. Similar sounding words can inadvertently be transcribed and may not be corrected upon review.

## 2019-06-13 NOTE — Telephone Encounter (Signed)
Oral Oncology Pharmacist Encounter  Received new prescription for Tagrisso (osimertinib) for the treatment of recurrent NSCLC, EGFR exon 21 mutation positive, planned duration until disease progression or unacceptable drug toxicity.  ECG preformed on 06/13/19, QTc 428 ms. Prescription dose and frequency assessed.   Current medication list in Epic reviewed, one DDIs with osimertinib identified: Rosuvastatin: Osimertinib may increase the concentration of rosuvastatin. Monitor patient for adverse effects related to rosuvastatin (ie muscle pain). No baseline dose adjustment needed.  Prescription has been e-scribed to the [facility] for benefits analysis and approval.  Oral Oncology Clinic will continue to follow for insurance authorization, copayment issues, initial counseling and start date.  Darl Pikes, PharmD, BCPS, [facility] Hematology/Oncology Clinical Pharmacist [facility] Oral [place] [facility] [phone]  06/13/2019 12:51 PM

## 2019-06-13 NOTE — Telephone Encounter (Signed)
Oral Oncology Patient Advocate Encounter  Prior Authorization for [name] has been approved.    PA# [id number] Effective dates: 06/13/19 through 06/12/20  Patients co-pay is $2734.71  Oral Oncology Clinic will continue to follow.   [place] Patient [place] Phone [phone] Fax [phone] 06/13/2019 3:03 PM

## 2019-06-13 NOTE — Telephone Encounter (Signed)
Oral Oncology Patient Advocate Encounter  Received notification from [facility] that prior authorization for Tagrisso is required.  PA submitted on CoverMyMeds Key [id number] Status is pending  Oral Oncology Clinic will continue to follow.  [place] Patient [place] Phone [phone] Fax [phone] 06/13/2019 2:23 PM

## 2019-06-14 ENCOUNTER — Encounter: Payer: Self-pay | Admitting: *Deleted

## 2019-06-14 NOTE — Progress Notes (Signed)
Oncology Nurse Navigator Documentation  Oncology Nurse Navigator Flowsheets 06/14/2019  Abnormal Finding Date -  Confirmed Diagnosis Date -  Expected Surgery Date -  Navigation Complete Date: -  Reason Not Navigating Patient: -  Navigator Location [facility]  Referral Date to RadOnc/MedOnc -  Navigator Encounter Type Other:  Telephone -  Multidisiplinary Clinic Date -  Multidisiplinary Clinic Type -  Treatment Initiated Date -  Patient Visit Type -  Treatment Phase -  Barriers/Navigation Needs Coordination of Care/I received a message from pathology dept.  They need clarification of PDL 1 order.  I contacted Dr. [name] and he would like tissue to be tested for PDL 1.  [id number] case number.  This has been requested   Education -  Interventions Coordination of Care  Acuity Level 2-Minimal Needs (1-2 Barriers Identified)  Coordination of Care Other  Education Method -  Time Spent with Patient 15

## 2019-06-14 NOTE — Telephone Encounter (Signed)
Oral Chemotherapy Pharmacist Encounter  [facility] will deliver medication to Ms. Telford on 06/18/19. She know to get started once she receives the medication.  Patient Education I spoke with patient for overview of new oral chemotherapy medication: Tagrisso (osimertinib) for the treatment of recurrent NSCLC, EGFR exon 21 mutation positive, planned duration until disease progression or unacceptable drug toxicity.   Counseled patient on administration, dosing, side effects, monitoring, drug-food interactions, safe handling, storage, and disposal. Patient will take 1 tablet (80 mg total) by mouth daily.  Side effects include but not limited to: decreased wbc, diarrhea, rash/itchy skin.    Ms. Heckart has a lot of allergies and is anxious about having issues with osimertinib. She plans on taking her first dose at a local office so she can be monitored. She will coordinate this.  Reviewed with patient importance of keeping a medication schedule and plan for any missed doses.  Ms. Kantor voiced understanding and appreciation. All questions answered. Medication handout placed in the mail and emailed to her.  Provided patient with Oral [facility] [facility] phone number. Patient knows to call the office with questions or concerns. Oral Chemotherapy Navigation Clinic will continue to follow.  Darl Pikes, PharmD, BCPS, [facility] Hematology/Oncology Clinical Pharmacist [facility] [place] [facility] [phone]  06/14/2019 4:36 PM

## 2019-06-17 ENCOUNTER — Telehealth: Payer: Self-pay | Admitting: Medical Oncology

## 2019-06-17 ENCOUNTER — Telehealth: Payer: Self-pay | Admitting: Internal Medicine

## 2019-06-17 MED FILL — TAGRISSO 80 MG TABLET: 80 | 30 days supply | Qty: 30 | Fill #0

## 2019-06-17 NOTE — Telephone Encounter (Signed)
Scheduled per 2/25 los. Called and spoke with patient. Confirmed appt. Patient would like to speak with nurse about pills she's supposed to take. Sent message to nurse to give patient a call

## 2019-06-17 NOTE — Telephone Encounter (Signed)
Tagrisso -Reviewed side effects with pt and signs and symptoms of allergic reaction. She will start the medication tomorrow or wed. I encouraged pt to call for any other concerns or questions.

## 2019-06-20 ENCOUNTER — Telehealth: Payer: Self-pay | Admitting: Pharmacist

## 2019-06-20 ENCOUNTER — Telehealth: Payer: Self-pay | Admitting: Medical Oncology

## 2019-06-20 NOTE — Telephone Encounter (Signed)
Now.  She needs to follow-up with her primary care physician for the cholesterol issues.  Thank you.

## 2019-06-20 NOTE — Telephone Encounter (Signed)
Side effect-Within 10 minutes of taking the crestor  ( 5 hours after Tagrisso)  she experienced "feeling swimmy headed, nausea ,did not feel go .Today I had hip and joint pain".  I told her to not take crestor tonight and call PCP re her Crestor concerns.   She is asking for a cholesterol lab  at next labs. I told her to have her provider fax an order and diagnosis codes for it.

## 2019-06-20 NOTE — Telephone Encounter (Signed)
Oral Chemotherapy Pharmacist Encounter   Ms. Love gave me a call on 3/4 to let me know she tolerated 2 doses of Tagrisso. She went to her local dermatologist office where knows the provider to take her first 2 doses. Given her history of medication allergies she was concerned about starting a new medication. She was pleased that it was going well so far. She did not that she felt nauseous and had some hip pain after taking her rosuvastatin. There is a DDI with rosuvastatin and Tagrisso that I had reviewed with Ms. Scobee ahead of starting. She is on a very low dose of rosuvastatin. She does not want to take the rosuvastatin again. I instructed her to speak to her PCP about the rosuvastatin.  Darl Pikes, PharmD, BCPS, [facility] Hematology/Oncology Clinical Pharmacist [facility] [phone]  06/20/2019 4:32 PM

## 2019-06-21 ENCOUNTER — Telehealth: Payer: Self-pay | Admitting: Medical Oncology

## 2019-06-21 NOTE — Telephone Encounter (Signed)
Per our Pharmacist , [name]  can take these statin with tagrisso -simvasatin, atovastatin, and pravastatin .   Pt notified of above and she left message her PCP.

## 2019-06-21 NOTE — Telephone Encounter (Addendum)
Pt notified . She did not have any symptoms last nigh before. Some slight neuropathy ,joint pan . She will call her PCP.  Can she continue b 12 ,folate , fish oil and take a multivitamin?  I told her she can take those meds, except  Talk to PCP about fish oil.

## 2019-06-27 ENCOUNTER — Telehealth: Payer: Self-pay | Admitting: Medical Oncology

## 2019-06-27 NOTE — Telephone Encounter (Signed)
Lab order for Lipid panel given to lab.

## 2019-06-28 ENCOUNTER — Encounter (HOSPITAL_COMMUNITY): Payer: Self-pay | Admitting: Internal Medicine

## 2019-07-01 ENCOUNTER — Telehealth: Payer: Self-pay | Admitting: Medical Oncology

## 2019-07-01 NOTE — Telephone Encounter (Signed)
Sat - itchy rash arms, legs, back -not on stomach or buttocks.Low grade fever 99.4. generally does not feel well.   Per local dermatology, "measles like rash, it looks like roscea". Prescribed  Minocycline, benadryl .  Itching is better today.  Minocycline and  benadryl are helping .  Per [name] I instructed her to continue tagrisso and medcations as prescribed. Monitor symptoms for now and keep appt for Thursday. If symptoms get worse before then she will call back.  Mohamed may consider steroids.

## 2019-07-04 ENCOUNTER — Encounter: Payer: Self-pay | Admitting: Internal Medicine

## 2019-07-04 ENCOUNTER — Inpatient Hospital Stay: Payer: Medicare Other

## 2019-07-04 ENCOUNTER — Inpatient Hospital Stay: Payer: Medicare Other | Attending: Internal Medicine | Admitting: Internal Medicine

## 2019-07-04 ENCOUNTER — Other Ambulatory Visit: Payer: Self-pay

## 2019-07-04 VITALS — BP 153/73 | HR 94 | Temp 99.1°F | Resp 18 | Ht 64.75 in | Wt 154.1 lb

## 2019-07-04 DIAGNOSIS — C3492 Malignant neoplasm of unspecified part of left bronchus or lung: Secondary | ICD-10-CM

## 2019-07-04 DIAGNOSIS — I10 Essential (primary) hypertension: Secondary | ICD-10-CM | POA: Insufficient documentation

## 2019-07-04 DIAGNOSIS — E785 Hyperlipidemia, unspecified: Secondary | ICD-10-CM | POA: Diagnosis not present

## 2019-07-04 DIAGNOSIS — C3432 Malignant neoplasm of lower lobe, left bronchus or lung: Secondary | ICD-10-CM | POA: Insufficient documentation

## 2019-07-04 DIAGNOSIS — Z902 Acquired absence of lung [part of]: Secondary | ICD-10-CM | POA: Diagnosis not present

## 2019-07-04 DIAGNOSIS — K219 Gastro-esophageal reflux disease without esophagitis: Secondary | ICD-10-CM | POA: Insufficient documentation

## 2019-07-04 DIAGNOSIS — Z5111 Encounter for antineoplastic chemotherapy: Secondary | ICD-10-CM

## 2019-07-04 DIAGNOSIS — L270 Generalized skin eruption due to drugs and medicaments taken internally: Secondary | ICD-10-CM | POA: Diagnosis not present

## 2019-07-04 DIAGNOSIS — Z79899 Other long term (current) drug therapy: Secondary | ICD-10-CM | POA: Insufficient documentation

## 2019-07-04 DIAGNOSIS — K589 Irritable bowel syndrome without diarrhea: Secondary | ICD-10-CM | POA: Insufficient documentation

## 2019-07-04 LAB — CBC WITH DIFFERENTIAL (CANCER CENTER ONLY)
Abs Immature Granulocytes: 0.02 10*3/uL (ref 0.00–0.07)
Basophils Absolute: 0 10*3/uL (ref 0.0–0.1)
Basophils Relative: 0 %
Eosinophils Absolute: 0.1 10*3/uL (ref 0.0–0.5)
Eosinophils Relative: 2 %
HCT: 33.8 % — ABNORMAL LOW (ref 36.0–46.0)
Hemoglobin: 10.7 g/dL — ABNORMAL LOW (ref 12.0–15.0)
Immature Granulocytes: 0 %
Lymphocytes Relative: 20 %
Lymphs Abs: 1.2 10*3/uL (ref 0.7–4.0)
MCH: 30.2 pg (ref 26.0–34.0)
MCHC: 31.7 g/dL (ref 30.0–36.0)
MCV: 95.5 fL (ref 80.0–100.0)
Monocytes Absolute: 0.4 10*3/uL (ref 0.1–1.0)
Monocytes Relative: 7 %
Neutro Abs: 4.2 10*3/uL (ref 1.7–7.7)
Neutrophils Relative %: 71 %
Platelet Count: 156 10*3/uL (ref 150–400)
RBC: 3.54 MIL/uL — ABNORMAL LOW (ref 3.87–5.11)
RDW: 12.3 % (ref 11.5–15.5)
WBC Count: 6 10*3/uL (ref 4.0–10.5)
nRBC: 0 % (ref 0.0–0.2)

## 2019-07-04 LAB — CMP (CANCER CENTER ONLY)
ALT: 12 U/L (ref 0–44)
AST: 17 U/L (ref 15–41)
Albumin: 3.7 g/dL (ref 3.5–5.0)
Alkaline Phosphatase: 58 U/L (ref 38–126)
Anion gap: 12 (ref 5–15)
BUN: 14 mg/dL (ref 8–23)
CO2: 24 mmol/L (ref 22–32)
Calcium: 8.7 mg/dL — ABNORMAL LOW (ref 8.9–10.3)
Chloride: 101 mmol/L (ref 98–111)
Creatinine: 0.83 mg/dL (ref 0.44–1.00)
GFR, Est AFR Am: >60 mL/min (ref >60)
GFR, Estimated: >60 mL/min (ref >60)
Glucose, Bld: 107 mg/dL — ABNORMAL HIGH (ref 70–99)
Potassium: 3.3 mmol/L — ABNORMAL LOW (ref 3.5–5.1)
Sodium: 137 mmol/L (ref 135–145)
Total Bilirubin: 0.5 mg/dL (ref 0.3–1.2)
Total Protein: 6.9 g/dL (ref 6.5–8.1)

## 2019-07-04 MED ORDER — METHYLPREDNISOLONE 4 MG PO TBPK: ORAL_TABLET | ORAL | 0 refills | Status: DC

## 2019-07-04 MED FILL — METHYLPREDNISOLONE 4 MG TBP: 4 | 6 days supply | Qty: 21 | Fill #0

## 2019-07-04 NOTE — Progress Notes (Signed)
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 Roxan Hockey.  She declined adjuvant therapy  CURRENT THERAPY: Osimertinib (Tagrisso) 80 mg p.o. daily.  First dose was on June 19, 2019.  INTERVAL HISTORY: [name] 69 y.o. female returns to the clinic today for follow-up visit accompanied by her husband.  The patient continues to have low-grade fever in addition to significant skin rash involving the upper and lower extremities as well as the back and abdomen.  Her rash developed after the start of Tagrisso.  She was seen by her dermatologist and was given prescription for steroid creams.  It does help some with the rash but not significant improvement.  She denied having any current chest pain, shortness of breath, but has mild dry cough with no hemoptysis.  She lost few pounds since her last visit because of lack of appetite.  She denied having any nausea, vomiting, diarrhea or constipation.  She denied having any headache or visual changes.  She is here today for evaluation and recommendation regarding her condition.  MEDICAL HISTORY: Past Medical History:  Diagnosis Date  . Anemia   . Atherosclerosis   . GERD (gastroesophageal reflux disease)   . Hemoptysis   . Hyperlipidemia   . Hypertension   . IBS (irritable bowel syndrome)   . Lung mass   . Migraines   . Spondylosis   . Vitamin D deficiency     ALLERGIES:  is allergic to avocado; influenza vaccines; lisinopril; metoprolol; nexium [esomeprazole magnesium];  penicillins; pork-derived products; proton pump inhibitors; sulfites; wasp venom protein; zantac [ranitidine hcl]; cefaclor; chlorhexidine; chloroxine; ciprofloxacin hcl; clarithromycin; gatifloxacin; other; sulfa antibiotics; sulfamethoxazole-trimethoprim; eggs or egg-derived products; aspartame; chocolate; dilaudid [hydromorphone hcl]; fentanyl; ivp dye [iodinated diagnostic agents]; ketorolac; lactase; milk-related compounds; molds & smuts; oxycodone; prednisolone; shellfish allergy; and soap.  MEDICATIONS:  Current Outpatient Medications  Medication Sig Dispense Refill  . Carboxymethylcellul-Glycerin (LUBRICATING EYE DROPS OP) Place 1 drop into both eyes daily as needed (dry eyes).    . Cholecalciferol (VITAMIN D3) LIQD Take 4,000 Units by mouth daily.    . Coenzyme Q10 (COQ-10) 100 MG CAPS Take 200 mg by mouth daily.    Marland Kitchen diltiazem (TIAZAC) 180 MG 24 hr capsule Take 180 mg by mouth daily in the afternoon.     Marland Kitchen EPINEPHrine (EPIPEN 2-PAK) 0.3 mg/0.3 mL IJ SOAJ injection Inject 0.3 mg into the muscle as needed for anaphylaxis.     . Omega-3 Fatty Acids (FISH OIL PO) Take 2 capsules by mouth daily.    Marland Kitchen osimertinib mesylate (TAGRISSO) 80 MG tablet Take 1 tablet (80 mg total) by mouth daily. 30 tablet 2  . rizatriptan (MAXALT) 10 MG tablet Take 5 mg by mouth as needed for migraine. May repeat in 2 hours if needed     . rosuvastatin (CRESTOR) 5 MG tablet Take 5 mg by mouth daily.    . sucralfate (CARAFATE) 1 g tablet Take 1 g by mouth See admin instructions. Crush and drink 1 g by mouth 2-3 times daily    . vitamin  B-12 (CYANOCOBALAMIN) 1000 MCG tablet Take 1,000 mcg by mouth daily.      No current facility-administered medications for this visit.    SURGICAL HISTORY:  Past Surgical History:  Procedure Laterality Date  . BROW LIFT AND BLEPHAROPLASTY  03/15/2013  . CATARACT EXTRACTION, BILATERAL  06/23/2014  . CHEST TUBE INSERTION Left 05/10/2018   Procedure: CHEST TUBE INSERTION;  Surgeon:  [name], MD;  Location: [place] [facility];  Service: Thoracic;  Laterality: Left;  . COLONOSCOPY  05/2014  . ESOPHAGOGASTRODUODENOSCOPY  2019  . EYE SURGERY    . IR THORACENTESIS ASP PLEURAL SPACE W/IMG GUIDE  05/24/2018  . IR THORACENTESIS ASP PLEURAL SPACE W/IMG GUIDE  06/14/2018  . TONSILLECTOMY    . VIDEO ASSISTED THORACOSCOPY (VATS)/ LOBECTOMY Left 04/25/2018   Procedure: VIDEO ASSISTED THORACOSCOPY (VATS)LEFT LOWER LOBECTOMY, NODE DISSECTION;  Surgeon: [name], MD;  Location: [place];  Service: Thoracic;  Laterality: Left;  Marland Kitchen VIDEO BRONCHOSCOPY WITH ENDOBRONCHIAL NAVIGATION N/A 03/07/2018   Procedure: VIDEO BRONCHOSCOPY WITH ENDOBRONCHIAL NAVIGATION;  Surgeon: Collene Gobble, MD;  Location: [facility] OR;  Service: Thoracic;  Laterality: N/A;  . VIDEO BRONCHOSCOPY WITH INSERTION OF INTERBRONCHIAL VALVE (IBV) N/A 05/10/2018   Procedure: VIDEO BRONCHOSCOPY WITH INSERTION OF INTERBRONCHIAL VALVE (IBV);  Surgeon: [name], MD;  Location[facility] OR;  Service: Thoracic;  Laterality: N/A;  . VIDEO BRONCHOSCOPY WITH INSERTION OF INTERBRONCHIAL VALVE (IBV) N/A 06/13/2018   Procedure: VIDEO BRONCHOSCOPY WITH REMOVAL OF INTERBRONCHIAL VALVE (IBV) x 2;  Surgeon: [name], MD;  Location: [place];  Service: Thoracic;  Laterality: N/A;    REVIEW OF SYSTEMS:  Constitutional: positive for fatigue and weight loss Eyes: negative Ears, nose, mouth, throat, and face: negative Respiratory: positive for cough Cardiovascular: negative Gastrointestinal: negative Genitourinary:negative Integument/breast: positive for dryness and rash Hematologic/lymphatic: negative Musculoskeletal:negative Neurological: negative Behavioral/Psych: positive for anxiety Endocrine: negative Allergic/Immunologic: negative   PHYSICAL EXAMINATION: General appearance: alert, cooperative, fatigued and no distress Head: Normocephalic, without obvious abnormality, atraumatic Neck: no adenopathy, no JVD,  supple, symmetrical, trachea midline and thyroid not enlarged, symmetric, no tenderness/mass/nodules Lymph nodes: Cervical, supraclavicular, and axillary nodes normal. Resp: clear to auscultation bilaterally Back: symmetric, no curvature. ROM normal. No CVA tenderness. Cardio: regular rate and rhythm, S1, S2 normal, no murmur, click, rub or gallop GI: soft, non-tender; bowel sounds normal; no masses,  no organomegaly Extremities: extremities normal, atraumatic, no cyanosis or edema Neurologic: Alert and oriented X 3, normal strength and tone. Normal symmetric reflexes. Normal coordination and gait          ECOG PERFORMANCE STATUS: 1 - Symptomatic but completely ambulatory  Blood pressure (!) 153/73, pulse 94, temperature 99.1 F (37.3 C), temperature source Temporal, resp. rate 18, height 5' 4.75" (1.645 m), weight 154 lb 1.6 oz (69.9 kg), SpO2 98 %.   LABORATORY DATA: Lab Results  Component Value Date   WBC 6.0 07/04/2019   HGB 10.7 (L) 07/04/2019   HCT 33.8 (L) 07/04/2019   MCV 95.5 07/04/2019   PLT 156 07/04/2019      Chemistry      Component Value Date/Time   NA 141 05/21/2019 1130   K 4.2 05/21/2019 1130   CL 103 05/21/2019 1130   CO2 28 05/21/2019 1130   BUN 16 05/21/2019 1130   CREATININE 0.89 05/21/2019 1130      Component Value Date/Time   CALCIUM 9.8 05/21/2019 1130   ALKPHOS 84 05/21/2019 1130   AST 20 05/21/2019 1130   ALT 19 05/21/2019  1130   BILITOT 0.4 05/21/2019 1130       RADIOGRAPHIC STUDIES: CT Biopsy  Result Date: July 04, 2019 CLINICAL DATA:  History adenocarcinoma of the left lower lobe with imaging evidence of recurrent hypermetabolic pleural masses and associated pleural effusion. The patient presents for biopsy a pleural mass. EXAM: CT GUIDED CORE BIOPSY OF LEFT PLEURAL MASS ANESTHESIA/SEDATION: 2.5 mg IV Versed Total Moderate Sedation Time:  28 minutes. The patient's level of consciousness and physiologic status were continuously monitored  during the procedure by Radiology nursing. PROCEDURE: The procedure risks, benefits, and alternatives were explained to the patient. Questions regarding the procedure were encouraged and answered. The patient understands and consents to the procedure. CT was performed in a prone position. The posterior left chest wall was prepped with chlorhexidine in a sterile fashion, and a sterile drape was applied covering the operative field. A sterile gown and sterile gloves were used for the procedure. Local anesthesia was provided with 1% Lidocaine. Under CT guidance, a 17 gauge needle was advanced to the level of a left medial and posterior lower chest pleural mass. After confirming needle tip position, an 18 gauge core biopsy sample was obtained and submitted in formalin. Gel-Foam pledgets were advanced through the outer needle. A pleural plug was also placed at the pleural entry site utilizing the [facility] device. COMPLICATIONS: None FINDINGS: Multiple left-sided pleural masses are present. The more inferior and medial pleural mass located in a paraspinous location was chosen for biopsy given its position. A single sample yielded solid tissue. There was very brisk active bleeding for emanating from the outer trocar needle after a single core biopsy was obtained and therefore Gel-Foam as well as a pleural plug were utilized in obtaining hemostasis. Further core biopsy samples were not obtained due to the apparent vascular nature of the tumor. IMPRESSION: CT-guided core biopsy of left lower posterior pleural mass. After a single intact core biopsy specimen was obtained, further samples were not obtained due to brisk bleeding from the outer trocar needle. Bleeding was controlled with injection of a Gel-Foam slurry and deposition of a pleural plug. Electronically Signed   By: Aletta Edouard M.D.   On: 2019/07/04 16:24    ASSESSMENT AND PLAN: This is a very pleasant 69 years old white female with a stage IIa non-small  cell lung cancer, adenocarcinoma with positive EGFR mutation in exon 21 (L858R) status post left lower lobectomy with lymph node dissection under the care of Dr. Roxan Hockey. The patient is currently on observation.  She declined to proceed with adjuvant systemic chemotherapy or targeted therapy. The patient has been in observation since her surgical resection. Recent CT scan of the chest followed by PET scan showed suspicious pleural-based mass and nodularity in the left lung concerning for disease recurrence.  Her disease recurrence was confirmed with repeat biopsy of the pleural-based left lung mass. We had molecular studies on the new biopsy and it was consistent with EGFR mutation exon 21 (L858R). The patient started treatment with Tagrisso 80 mg p.o. daily and she has significant rash after the treatment.  She has no other complications or side effect from this treatment. I recommended for her to hold her treatment with Tagrisso for the next 7 days until I see her back for follow-up visit at that time.  In the meantime I will start the patient on Medrol Dosepak.  I also recommended for the patient to continue with the hydrocortisone cream. For the hypertension she will continue with her current blood pressure  medication. The patient was advised to call immediately if she has any concerning symptoms in the interval.  The patient voices understanding of current disease status and treatment options and is in agreement with the current care plan. All questions were answered. The patient knows to call the clinic with any problems, questions or concerns. We can certainly see the patient much sooner if necessary.   Disclaimer: This note was dictated with voice recognition software. Similar sounding words can inadvertently be transcribed and may not be corrected upon review.

## 2019-07-05 ENCOUNTER — Encounter: Payer: Self-pay | Admitting: Medical Oncology

## 2019-07-05 ENCOUNTER — Telehealth: Payer: Self-pay | Admitting: Medical Oncology

## 2019-07-05 NOTE — Telephone Encounter (Signed)
Asking for cholesterol results. Emailed pt to tell her to call ordering provider.

## 2019-07-09 ENCOUNTER — Encounter: Payer: Self-pay | Admitting: *Deleted

## 2019-07-09 ENCOUNTER — Telehealth: Payer: Self-pay | Admitting: Internal Medicine

## 2019-07-09 NOTE — Telephone Encounter (Signed)
Faxed records to Dr. Tammi Klippel in [place].

## 2019-07-09 NOTE — Progress Notes (Signed)
Alchemist Study; Tissue from cancer recurrence biopsy done 06/11/19 was requested from [facility] pathology department.  Notified by Varney Biles in Pathology that there is insufficient material left in the block for any additional testing.  The block was used for [employer] testing and there is not enough left to send to the study.  [name], BSN, RN Clinical Research Nurse 07/09/2019 1:47 PM

## 2019-07-10 ENCOUNTER — Inpatient Hospital Stay: Payer: Medicare Other

## 2019-07-10 ENCOUNTER — Encounter: Payer: Self-pay | Admitting: *Deleted

## 2019-07-10 ENCOUNTER — Encounter: Payer: Self-pay | Admitting: Internal Medicine

## 2019-07-10 ENCOUNTER — Telehealth: Payer: Self-pay | Admitting: Internal Medicine

## 2019-07-10 ENCOUNTER — Other Ambulatory Visit: Payer: Self-pay

## 2019-07-10 ENCOUNTER — Other Ambulatory Visit: Payer: Self-pay | Admitting: Internal Medicine

## 2019-07-10 ENCOUNTER — Inpatient Hospital Stay (HOSPITAL_BASED_OUTPATIENT_CLINIC_OR_DEPARTMENT_OTHER): Payer: Medicare Other | Admitting: Internal Medicine

## 2019-07-10 VITALS — BP 166/67 | HR 83 | Temp 97.9°F | Resp 18 | Ht 64.75 in | Wt 152.9 lb

## 2019-07-10 DIAGNOSIS — L270 Generalized skin eruption due to drugs and medicaments taken internally: Secondary | ICD-10-CM

## 2019-07-10 DIAGNOSIS — C3432 Malignant neoplasm of lower lobe, left bronchus or lung: Secondary | ICD-10-CM | POA: Diagnosis not present

## 2019-07-10 DIAGNOSIS — Z5111 Encounter for antineoplastic chemotherapy: Secondary | ICD-10-CM

## 2019-07-10 DIAGNOSIS — C3492 Malignant neoplasm of unspecified part of left bronchus or lung: Secondary | ICD-10-CM

## 2019-07-10 LAB — CMP (CANCER CENTER ONLY)
ALT: 23 U/L (ref 0–44)
AST: 21 U/L (ref 15–41)
Albumin: 3.9 g/dL (ref 3.5–5.0)
Alkaline Phosphatase: 58 U/L (ref 38–126)
Anion gap: 11 (ref 5–15)
BUN: 21 mg/dL (ref 8–23)
CO2: 27 mmol/L (ref 22–32)
Calcium: 9.2 mg/dL (ref 8.9–10.3)
Chloride: 99 mmol/L (ref 98–111)
Creatinine: 0.9 mg/dL (ref 0.44–1.00)
GFR, Est AFR Am: >60 mL/min (ref >60)
GFR, Estimated: >60 mL/min (ref >60)
Glucose, Bld: 97 mg/dL (ref 70–99)
Potassium: 3.9 mmol/L (ref 3.5–5.1)
Sodium: 137 mmol/L (ref 135–145)
Total Bilirubin: 0.3 mg/dL (ref 0.3–1.2)
Total Protein: 7.1 g/dL (ref 6.5–8.1)

## 2019-07-10 LAB — CBC WITH DIFFERENTIAL (CANCER CENTER ONLY)
Abs Immature Granulocytes: 0.23 10*3/uL — ABNORMAL HIGH (ref 0.00–0.07)
Basophils Absolute: 0.1 10*3/uL (ref 0.0–0.1)
Basophils Relative: 0 %
Eosinophils Absolute: 0.3 10*3/uL (ref 0.0–0.5)
Eosinophils Relative: 2 %
HCT: 37.8 % (ref 36.0–46.0)
Hemoglobin: 12.1 g/dL (ref 12.0–15.0)
Immature Granulocytes: 2 %
Lymphocytes Relative: 27 %
Lymphs Abs: 3.7 10*3/uL (ref 0.7–4.0)
MCH: 30.4 pg (ref 26.0–34.0)
MCHC: 32 g/dL (ref 30.0–36.0)
MCV: 95 fL (ref 80.0–100.0)
Monocytes Absolute: 1.6 10*3/uL — ABNORMAL HIGH (ref 0.1–1.0)
Monocytes Relative: 12 %
Neutro Abs: 7.5 10*3/uL (ref 1.7–7.7)
Neutrophils Relative %: 57 %
Platelet Count: 382 10*3/uL (ref 150–400)
RBC: 3.98 MIL/uL (ref 3.87–5.11)
RDW: 12.2 % (ref 11.5–15.5)
WBC Count: 13.4 10*3/uL — ABNORMAL HIGH (ref 4.0–10.5)
nRBC: 0 % (ref 0.0–0.2)

## 2019-07-10 MED ORDER — METHYLPREDNISOLONE 4 MG PO TBPK: ORAL_TABLET | ORAL | 0 refills | Status: DC

## 2019-07-10 MED FILL — METHYLPREDNISOLONE 4 MG TBP: 4 | 21 days supply | Qty: 21 | Fill #0

## 2019-07-10 NOTE — Progress Notes (Signed)
Oncology Nurse Navigator Documentation  Oncology Nurse Navigator Flowsheets 07/10/2019  Abnormal Finding Date -  Confirmed Diagnosis Date -  Expected Surgery Date -  Navigation Complete Date: -  Reason Not Navigating Patient: -  Navigator Location [facility]  Referral Date to RadOnc/MedOnc -  Navigator Encounter Type Clinic/MDC/Ms. Doig was seen by Dr. [name].  She is having issues with rash due to oral biologic AE.  Dr. [name] updated patient and husband to take medrol dose pack when the rash gets worse.  Dr. [name] to order medrol dose pack.    Telephone -  [facility] Date -  Multidisiplinary Clinic Type -  Treatment Initiated Date -  Patient Visit Type MedOnc  Treatment Phase Treatment  Barriers/Navigation Needs No Barriers At This Time  Education -  Interventions None Required  Acuity Level 1-No Barriers  Coordination of Care -  Education Method -  Time Spent with Patient 15

## 2019-07-10 NOTE — Telephone Encounter (Signed)
Scheduled per los. Patient declined printout  

## 2019-07-10 NOTE — Progress Notes (Signed)
[name]   [phone]  OFFICE PROGRESS NOTE  Donald Prose, MD [place] [address]  DIAGNOSIS: Suspicious recurrent non-small cell lung cancer initially diagnosed as stage IIA (T2b, N0, M0) non-small cell lung cancer, adenocarcinoma diagnosed in November 2019  Molecular studies showed EGFR mutation in exon 21 (L858R) PD-L1 expression 0  PRIOR THERAPY: status post left lower lobectomy with lymph node dissection on April 25, 2018 under the care of Dr. Roxan Hockey.  She declined adjuvant therapy  CURRENT THERAPY: Osimertinib (Tagrisso) 80 mg p.o. daily.  First dose was on June 19, 2019.  INTERVAL HISTORY: [name] 69 y.o. female returns to the clinic today for follow-up visit.  The patient is feeling much better today with significant improvement of the skin rash.  She denied having any current chest pain, shortness of breath except with exertion with no cough or hemoptysis.  She denied having any fever or chills.  She has no nausea, vomiting, diarrhea or constipation.  She has been off treatment with Tagrisso for the last 6 days.  She is here today for reevaluation and repeat blood work.  MEDICAL HISTORY: Past Medical History:  Diagnosis Date  . Anemia   . Atherosclerosis   . GERD (gastroesophageal reflux disease)   . Hemoptysis   . Hyperlipidemia   . Hypertension   . IBS (irritable bowel syndrome)   . Lung mass   . Migraines   . Spondylosis   . Vitamin D deficiency     ALLERGIES:  is allergic to avocado; influenza vaccines; lisinopril; metoprolol; nexium [esomeprazole magnesium]; penicillins; pork-derived products; proton pump inhibitors; sulfites; wasp venom protein; zantac [ranitidine hcl]; cefaclor; chlorhexidine; chloroxine; ciprofloxacin hcl; clarithromycin; gatifloxacin; other; sulfa antibiotics; sulfamethoxazole-trimethoprim; eggs or egg-derived products; aspartame; chocolate; dilaudid  [hydromorphone hcl]; fentanyl; ivp dye [iodinated diagnostic agents]; ketorolac; lactase; milk-related compounds; molds & smuts; oxycodone; prednisolone; shellfish allergy; and soap.  MEDICATIONS:  Current Outpatient Medications  Medication Sig Dispense Refill  . Carboxymethylcellul-Glycerin (LUBRICATING EYE DROPS OP) Place 1 drop into both eyes daily as needed (dry eyes).    . Cholecalciferol (VITAMIN D3) LIQD Take 4,000 Units by mouth daily.    . Coenzyme Q10 (COQ-10) 100 MG CAPS Take 200 mg by mouth daily.    Marland Kitchen diltiazem (TIAZAC) 180 MG 24 hr capsule Take 180 mg by mouth daily in the afternoon.     Marland Kitchen EPINEPHrine (EPIPEN 2-PAK) 0.3 mg/0.3 mL IJ SOAJ injection Inject 0.3 mg into the muscle as needed for anaphylaxis.     Marland Kitchen methylPREDNISolone (MEDROL DOSEPAK) 4 MG TBPK tablet Use as instructed. 21 tablet 0  . Omega-3 Fatty Acids (FISH OIL PO) Take 2 capsules by mouth daily.    Marland Kitchen osimertinib mesylate (TAGRISSO) 80 MG tablet Take 1 tablet (80 mg total) by mouth daily. 30 tablet 2  . OVER THE COUNTER MEDICATION Triamcinolone, urea and menthol and camphor    . rizatriptan (MAXALT) 10 MG tablet Take 5 mg by mouth as needed for migraine. May repeat in 2 hours if needed     . sucralfate (CARAFATE) 1 g tablet Take 1 g by mouth See admin instructions. Crush and drink 1 g by mouth 2-3 times daily    . vitamin B-12 (CYANOCOBALAMIN) 1000 MCG tablet Take 1,000 mcg by mouth daily.      No current facility-administered medications for this visit.    SURGICAL HISTORY:  Past Surgical History:  Procedure Laterality Date  .  BROW LIFT AND BLEPHAROPLASTY  03/15/2013  . CATARACT EXTRACTION, BILATERAL  06/23/2014  . CHEST TUBE INSERTION Left 05/10/2018   Procedure: CHEST TUBE INSERTION;  Surgeon: [name], MD;  Location: [facility];  Service: Thoracic;  Laterality: Left;  . COLONOSCOPY  05/2014  . ESOPHAGOGASTRODUODENOSCOPY  2019  . EYE SURGERY    . IR THORACENTESIS ASP PLEURAL SPACE W/IMG GUIDE   05/24/2018  . IR THORACENTESIS ASP PLEURAL SPACE W/IMG GUIDE  06/14/2018  . TONSILLECTOMY    . VIDEO ASSISTED THORACOSCOPY (VATS)/ LOBECTOMY Left 04/25/2018   Procedure: VIDEO ASSISTED THORACOSCOPY (VATS)LEFT LOWER LOBECTOMY, NODE DISSECTION;  Surgeon: [name], MD;  Location: [facility];  Service: Thoracic;  Laterality: Left;  Marland Kitchen VIDEO BRONCHOSCOPY WITH ENDOBRONCHIAL NAVIGATION N/A 03/07/2018   Procedure: VIDEO BRONCHOSCOPY WITH ENDOBRONCHIAL NAVIGATION;  Surgeon: Collene Gobble, MD;  [facility];  Service: Thoracic;  Laterality: N/A;  . VIDEO BRONCHOSCOPY WITH INSERTION OF INTERBRONCHIAL VALVE (IBV) N/A 05/10/2018   Procedure: VIDEO BRONCHOSCOPY WITH INSERTION OF INTERBRONCHIAL VALVE (IBV);  Surgeon: [name], MD;  Location: [facility];  Service: Thoracic;  Laterality: N/A;  . VIDEO BRONCHOSCOPY WITH INSERTION OF INTERBRONCHIAL VALVE (IBV) N/A 06/13/2018   Procedure: VIDEO BRONCHOSCOPY WITH REMOVAL OF INTERBRONCHIAL VALVE (IBV) x 2;  Surgeon: [name], MD;  Location: [facility];  Service: Thoracic;  Laterality: N/A;    REVIEW OF SYSTEMS:  A comprehensive review of systems was negative except for: Constitutional: positive for fatigue Integument/breast: positive for rash   PHYSICAL EXAMINATION: General appearance: alert, cooperative and no distress Head: Normocephalic, without obvious abnormality, atraumatic Neck: no adenopathy, no JVD, supple, symmetrical, trachea midline and thyroid not enlarged, symmetric, no tenderness/mass/nodules Lymph nodes: Cervical, supraclavicular, and axillary nodes normal. Resp: clear to auscultation bilaterally Back: symmetric, no curvature. ROM normal. No CVA tenderness. Cardio: regular rate and rhythm, S1, S2 normal, no murmur, click, rub or gallop GI: soft, non-tender; bowel sounds normal; no masses,  no organomegaly Extremities: extremities normal, atraumatic, no cyanosis or edema   ECOG PERFORMANCE STATUS: 1 - Symptomatic but  completely ambulatory  Blood pressure (!) 166/67, pulse 83, temperature 97.9 F (36.6 C), temperature source Oral, resp. rate 18, height 5' 4.75" (1.645 m), weight 152 lb 14.4 oz (69.4 kg), SpO2 99 %.   LABORATORY DATA: Lab Results  Component Value Date   WBC 13.4 (H) 07/10/2019   HGB 12.1 07/10/2019   HCT 37.8 07/10/2019   MCV 95.0 07/10/2019   PLT 382 07/10/2019      Chemistry      Component Value Date/Time   NA 137 07/10/2019 1027   K 3.9 07/10/2019 1027   CL 99 07/10/2019 1027   CO2 27 07/10/2019 1027   BUN 21 07/10/2019 1027   CREATININE 0.90 07/10/2019 1027      Component Value Date/Time   CALCIUM 9.2 07/10/2019 1027   ALKPHOS 58 07/10/2019 1027   AST 21 07/10/2019 1027   ALT 23 07/10/2019 1027   BILITOT 0.3 07/10/2019 1027       RADIOGRAPHIC STUDIES: CT Biopsy  Result Date: 2019/06/22 CLINICAL DATA:  History adenocarcinoma of the left lower lobe with imaging evidence of recurrent hypermetabolic pleural masses and associated pleural effusion. The patient presents for biopsy a pleural mass. EXAM: CT GUIDED CORE BIOPSY OF LEFT PLEURAL MASS ANESTHESIA/SEDATION: 2.5 mg IV Versed Total Moderate Sedation Time:  28 minutes. The patient's level of consciousness and physiologic status were continuously monitored during the procedure by Radiology nursing. PROCEDURE: The  procedure risks, benefits, and alternatives were explained to the patient. Questions regarding the procedure were encouraged and answered. The patient understands and consents to the procedure. CT was performed in a prone position. The posterior left chest wall was prepped with chlorhexidine in a sterile fashion, and a sterile drape was applied covering the operative field. A sterile gown and sterile gloves were used for the procedure. Local anesthesia was provided with 1% Lidocaine. Under CT guidance, a 17 gauge needle was advanced to the level of a left medial and posterior lower chest pleural mass. After  confirming needle tip position, an 18 gauge core biopsy sample was obtained and submitted in formalin. Gel-Foam pledgets were advanced through the outer needle. A pleural plug was also placed at the pleural entry site utilizing the [facility] device. COMPLICATIONS: None FINDINGS: Multiple left-sided pleural masses are present. The more inferior and medial pleural mass located in a paraspinous location was chosen for biopsy given its position. A single sample yielded solid tissue. There was very brisk active bleeding for emanating from the outer trocar needle after a single core biopsy was obtained and therefore Gel-Foam as well as a pleural plug were utilized in obtaining hemostasis. Further core biopsy samples were not obtained due to the apparent vascular nature of the tumor. IMPRESSION: CT-guided core biopsy of left lower posterior pleural mass. After a single intact core biopsy specimen was obtained, further samples were not obtained due to brisk bleeding from the outer trocar needle. Bleeding was controlled with injection of a Gel-Foam slurry and deposition of a pleural plug. Electronically Signed   By: Aletta Edouard M.D.   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 Roxan Hockey. The patient is currently on observation.  She declined to proceed with adjuvant systemic chemotherapy or targeted therapy. The patient has been in observation since her surgical resection. Recent CT scan of the chest followed by PET scan showed suspicious pleural-based mass and nodularity in the left lung concerning for disease recurrence.  Her disease recurrence was confirmed with repeat biopsy of the pleural-based left lung mass. We had molecular studies on the new biopsy and it was  consistent with EGFR mutation exon 21 (L858R). The patient started treatment with Tagrisso 80 mg p.o. daily. She is tolerating the treatment well except for the rash.  He has been off treatment for the last 6 days and she has significant improvement of the rash with the Medrol Dosepak. I recommended for the patient to resume her treatment with Tagrisso with the same dose for now.  I give her prescription for Medrol dose pack to be used if she has significant skin rash in the future.  She will continue with the hydrocortisone cream as needed. For the hypertension she will continue with her current blood pressure medication. I will see her back for follow-up visit in 2 weeks for evaluation and repeat blood work.  She was advised to call immediately if she has any concerning symptoms in the interval.  The patient voices understanding of current disease status and treatment options and is in agreement with the current care plan. All questions were answered. The patient knows to call the clinic with any problems, questions or concerns. We can certainly see the patient much sooner if  necessary.   Disclaimer: This note was dictated with voice recognition software. Similar sounding words can inadvertently be transcribed and may not be corrected upon review.

## 2019-07-19 MED FILL — TAGRISSO 80 MG TABLET: 80 | 30 days supply | Qty: 30 | Fill #1

## 2019-07-24 ENCOUNTER — Other Ambulatory Visit: Payer: Self-pay

## 2019-07-24 ENCOUNTER — Inpatient Hospital Stay: Payer: Medicare Other | Attending: Internal Medicine | Admitting: Internal Medicine

## 2019-07-24 ENCOUNTER — Telehealth: Payer: Self-pay | Admitting: Internal Medicine

## 2019-07-24 ENCOUNTER — Encounter: Payer: Self-pay | Admitting: Internal Medicine

## 2019-07-24 ENCOUNTER — Inpatient Hospital Stay: Payer: Medicare Other

## 2019-07-24 VITALS — BP 160/81 | HR 85 | Temp 98.9°F | Resp 18 | Ht 64.75 in | Wt 152.6 lb

## 2019-07-24 DIAGNOSIS — C3432 Malignant neoplasm of lower lobe, left bronchus or lung: Secondary | ICD-10-CM | POA: Diagnosis not present

## 2019-07-24 DIAGNOSIS — E785 Hyperlipidemia, unspecified: Secondary | ICD-10-CM | POA: Diagnosis not present

## 2019-07-24 DIAGNOSIS — E559 Vitamin D deficiency, unspecified: Secondary | ICD-10-CM | POA: Insufficient documentation

## 2019-07-24 DIAGNOSIS — I10 Essential (primary) hypertension: Secondary | ICD-10-CM | POA: Diagnosis not present

## 2019-07-24 DIAGNOSIS — I2510 Atherosclerotic heart disease of native coronary artery without angina pectoris: Secondary | ICD-10-CM | POA: Diagnosis not present

## 2019-07-24 DIAGNOSIS — C3490 Malignant neoplasm of unspecified part of unspecified bronchus or lung: Secondary | ICD-10-CM | POA: Diagnosis not present

## 2019-07-24 DIAGNOSIS — Z79899 Other long term (current) drug therapy: Secondary | ICD-10-CM | POA: Diagnosis not present

## 2019-07-24 DIAGNOSIS — K219 Gastro-esophageal reflux disease without esophagitis: Secondary | ICD-10-CM | POA: Diagnosis not present

## 2019-07-24 DIAGNOSIS — C782 Secondary malignant neoplasm of pleura: Secondary | ICD-10-CM | POA: Diagnosis not present

## 2019-07-24 DIAGNOSIS — K589 Irritable bowel syndrome without diarrhea: Secondary | ICD-10-CM | POA: Insufficient documentation

## 2019-07-24 DIAGNOSIS — Z5111 Encounter for antineoplastic chemotherapy: Secondary | ICD-10-CM | POA: Diagnosis not present

## 2019-07-24 DIAGNOSIS — C3492 Malignant neoplasm of unspecified part of left bronchus or lung: Secondary | ICD-10-CM

## 2019-07-24 LAB — CMP (CANCER CENTER ONLY)
ALT: 13 U/L (ref 0–44)
AST: 16 U/L (ref 15–41)
Albumin: 3.7 g/dL (ref 3.5–5.0)
Alkaline Phosphatase: 54 U/L (ref 38–126)
Anion gap: 8 (ref 5–15)
BUN: 12 mg/dL (ref 8–23)
CO2: 26 mmol/L (ref 22–32)
Calcium: 9.1 mg/dL (ref 8.9–10.3)
Chloride: 105 mmol/L (ref 98–111)
Creatinine: 0.84 mg/dL (ref 0.44–1.00)
GFR, Est AFR Am: >60 mL/min (ref >60)
GFR, Estimated: >60 mL/min (ref >60)
Glucose, Bld: 112 mg/dL — ABNORMAL HIGH (ref 70–99)
Potassium: 4 mmol/L (ref 3.5–5.1)
Sodium: 139 mmol/L (ref 135–145)
Total Bilirubin: 0.3 mg/dL (ref 0.3–1.2)
Total Protein: 7 g/dL (ref 6.5–8.1)

## 2019-07-24 LAB — CBC WITH DIFFERENTIAL (CANCER CENTER ONLY)
Abs Immature Granulocytes: 0.01 10*3/uL (ref 0.00–0.07)
Basophils Absolute: 0 10*3/uL (ref 0.0–0.1)
Basophils Relative: 1 %
Eosinophils Absolute: 0.1 10*3/uL (ref 0.0–0.5)
Eosinophils Relative: 2 %
HCT: 34.6 % — ABNORMAL LOW (ref 36.0–46.0)
Hemoglobin: 10.9 g/dL — ABNORMAL LOW (ref 12.0–15.0)
Immature Granulocytes: 0 %
Lymphocytes Relative: 30 %
Lymphs Abs: 1.7 10*3/uL (ref 0.7–4.0)
MCH: 30 pg (ref 26.0–34.0)
MCHC: 31.5 g/dL (ref 30.0–36.0)
MCV: 95.3 fL (ref 80.0–100.0)
Monocytes Absolute: 0.5 10*3/uL (ref 0.1–1.0)
Monocytes Relative: 9 %
Neutro Abs: 3.4 10*3/uL (ref 1.7–7.7)
Neutrophils Relative %: 58 %
Platelet Count: 213 10*3/uL (ref 150–400)
RBC: 3.63 MIL/uL — ABNORMAL LOW (ref 3.87–5.11)
RDW: 13 % (ref 11.5–15.5)
WBC Count: 5.8 10*3/uL (ref 4.0–10.5)
nRBC: 0 % (ref 0.0–0.2)

## 2019-07-24 NOTE — Telephone Encounter (Signed)
Scheduled per los. Gave avs and calendar  

## 2019-07-24 NOTE — Progress Notes (Signed)
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 Roxan Hockey.  She declined adjuvant therapy  CURRENT THERAPY: Osimertinib (Tagrisso) 80 mg p.o. daily.  First dose was on June 19, 2019.  Status post 1 month of treatment.  INTERVAL HISTORY: [name] 69 y.o. female returns to the clinic today for follow-up visit accompanied by her husband.  The patient is feeling much better today and no recurrent skin rash.  She denied having any current chest pain, shortness of breath, cough or hemoptysis.  She denied having any fever or chills.  She has no nausea, vomiting, diarrhea or constipation.  She has no headache or visual changes.  She denied having any weight loss or night sweats.  She resumed her treatment with Tagrisso for the last 2 weeks with no significant adverse effects.  She is scheduled to see Dr. Tammi Klippel at [facility] [place] in [place] next week.  The patient is here today for evaluation and repeat blood work.  MEDICAL HISTORY: Past Medical History:  Diagnosis Date  . Anemia   . Atherosclerosis   . GERD (gastroesophageal reflux disease)   . Hemoptysis   . Hyperlipidemia   . Hypertension   . IBS (irritable bowel syndrome)   . Lung mass   . Migraines   . Spondylosis   . Vitamin D deficiency     ALLERGIES:  is allergic to avocado; influenza vaccines; lisinopril; metoprolol; nexium [esomeprazole magnesium]; penicillins; pork-derived products; proton pump inhibitors; sulfites; wasp venom  protein; zantac [ranitidine hcl]; cefaclor; chlorhexidine; chloroxine; ciprofloxacin hcl; clarithromycin; gatifloxacin; other; sulfa antibiotics; sulfamethoxazole-trimethoprim; eggs or egg-derived products; aspartame; chocolate; dilaudid [hydromorphone hcl]; fentanyl; ivp dye [iodinated diagnostic agents]; ketorolac; lactase; milk-related compounds; molds & smuts; oxycodone; prednisolone; shellfish allergy; and soap.  MEDICATIONS:  Current Outpatient Medications  Medication Sig Dispense Refill  . Carboxymethylcellul-Glycerin (LUBRICATING EYE DROPS OP) Place 1 drop into both eyes daily as needed (dry eyes).    . Cholecalciferol (VITAMIN D3) LIQD Take 4,000 Units by mouth daily.    . Coenzyme Q10 (COQ-10) 100 MG CAPS Take 200 mg by mouth daily.    Marland Kitchen diltiazem (TIAZAC) 180 MG 24 hr capsule Take 180 mg by mouth daily in the afternoon.     Marland Kitchen EPINEPHrine (EPIPEN 2-PAK) 0.3 mg/0.3 mL IJ SOAJ injection Inject 0.3 mg into the muscle as needed for anaphylaxis.     Marland Kitchen methylPREDNISolone (MEDROL DOSEPAK) 4 MG TBPK tablet Use as instructed. 21 tablet 0  . Omega-3 Fatty Acids (FISH OIL PO) Take 2 capsules by mouth daily.    Marland Kitchen osimertinib mesylate (TAGRISSO) 80 MG tablet Take 1 tablet (80 mg total) by mouth daily. 30 tablet 2  . OVER THE COUNTER MEDICATION Triamcinolone, urea and menthol and camphor    . rizatriptan (MAXALT) 10 MG tablet Take 5 mg by mouth as needed for migraine. May repeat in 2 hours if needed     . sucralfate (CARAFATE) 1 g tablet Take 1 g by mouth See admin instructions. Crush and drink 1 g by mouth 2-3 times  daily    . vitamin B-12 (CYANOCOBALAMIN) 1000 MCG tablet Take 1,000 mcg by mouth daily.      No current facility-administered medications for this visit.    SURGICAL HISTORY:  Past Surgical History:  Procedure Laterality Date  . BROW LIFT AND BLEPHAROPLASTY  03/15/2013  . CATARACT EXTRACTION, BILATERAL  06/23/2014  . CHEST TUBE INSERTION Left 05/10/2018   Procedure: CHEST TUBE  INSERTION;  Surgeon: [name], MD;  Location: [facility];  Service: Thoracic;  Laterality: Left;  . COLONOSCOPY  05/2014  . ESOPHAGOGASTRODUODENOSCOPY  2019  . EYE SURGERY    . IR THORACENTESIS ASP PLEURAL SPACE W/IMG GUIDE  05/24/2018  . IR THORACENTESIS ASP PLEURAL SPACE W/IMG GUIDE  06/14/2018  . TONSILLECTOMY    . VIDEO ASSISTED THORACOSCOPY (VATS)/ LOBECTOMY Left 04/25/2018   Procedure: VIDEO ASSISTED THORACOSCOPY (VATS)LEFT LOWER LOBECTOMY, NODE DISSECTION;  Surgeon: [name], MD;  Location: [facility];  Service: Thoracic;  Laterality: Left;  Marland Kitchen VIDEO BRONCHOSCOPY WITH ENDOBRONCHIAL NAVIGATION N/A 03/07/2018   Procedure: VIDEO BRONCHOSCOPY WITH ENDOBRONCHIAL NAVIGATION;  Surgeon: Collene Gobble, MD;  Location[facility];  Service: Thoracic;  Laterality: N/A;  . VIDEO BRONCHOSCOPY WITH INSERTION OF INTERBRONCHIAL VALVE (IBV) N/A 05/10/2018   Procedure: VIDEO BRONCHOSCOPY WITH INSERTION OF INTERBRONCHIAL VALVE (IBV);  Surgeon: [name], MD;  Location: [facility];  Service: Thoracic;  Laterality: N/A;  . VIDEO BRONCHOSCOPY WITH INSERTION OF INTERBRONCHIAL VALVE (IBV) N/A 06/13/2018   Procedure: VIDEO BRONCHOSCOPY WITH REMOVAL OF INTERBRONCHIAL VALVE (IBV) x 2;  Surgeon: [name], MD;  Location: [facility];  Service: Thoracic;  Laterality: N/A;    REVIEW OF SYSTEMS:  A comprehensive review of systems was negative.   PHYSICAL EXAMINATION: General appearance: alert, cooperative and no distress Head: Normocephalic, without obvious abnormality, atraumatic Neck: no adenopathy, no JVD, supple, symmetrical, trachea midline and thyroid not enlarged, symmetric, no tenderness/mass/nodules Lymph nodes: Cervical, supraclavicular, and axillary nodes normal. Resp: clear to auscultation bilaterally Back: symmetric, no curvature. ROM normal. No CVA tenderness. Cardio: regular rate and rhythm, S1, S2 normal, no murmur, click, rub or gallop GI: soft, non-tender; bowel sounds normal; no  masses,  no organomegaly Extremities: extremities normal, atraumatic, no cyanosis or edema   ECOG PERFORMANCE STATUS: 1 - Symptomatic but completely ambulatory  Blood pressure (!) 160/81, pulse 85, temperature 98.9 F (37.2 C), temperature source Temporal, resp. rate 18, height 5' 4.75" (1.645 m), weight 152 lb 9.6 oz (69.2 kg), SpO2 97 %.   LABORATORY DATA: Lab Results  Component Value Date   WBC 5.8 07/24/2019   HGB 10.9 (L) 07/24/2019   HCT 34.6 (L) 07/24/2019   MCV 95.3 07/24/2019   PLT 213 07/24/2019      Chemistry      Component Value Date/Time   NA 137 07/10/2019 1027   K 3.9 07/10/2019 1027   CL 99 07/10/2019 1027   CO2 27 07/10/2019 1027   BUN 21 07/10/2019 1027   CREATININE 0.90 07/10/2019 1027      Component Value Date/Time   CALCIUM 9.2 07/10/2019 1027   ALKPHOS 58 07/10/2019 1027   AST 21 07/10/2019 1027   ALT 23 07/10/2019 1027   BILITOT 0.3 07/10/2019 1027       RADIOGRAPHIC STUDIES: No results found.  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 Roxan Hockey. The patient is currently on observation.  She declined to proceed with adjuvant systemic chemotherapy or targeted therapy. The patient has been in observation since her surgical resection. Recent CT scan of the chest followed by PET scan showed suspicious pleural-based mass and nodularity in the left lung concerning for disease recurrence.  Her disease recurrence was confirmed with repeat biopsy of the pleural-based left lung mass. We had molecular studies on the new biopsy and it was consistent with EGFR mutation exon 21 (L858R). The patient started treatment with Tagrisso 80 mg p.o. daily.  She is status post 1 months of treatment.  She tolerated the last 2 weeks fairly well with no  concerning adverse effects. I recommended for the patient to continue her current treatment with Tagrisso with the same dose. I will see her back for follow-up visit in 1 months for evaluation with repeat CT scan of the chest for restaging of her disease. For the hypertension she was advised to monitor her blood pressure closely at home. The patient was also advised to call immediately if she has any other concerning symptoms in the interval. The patient voices understanding of current disease status and treatment options and is in agreement with the current care plan. All questions were answered. The patient knows to call the clinic with any problems, questions or concerns. We can certainly see the patient much sooner if necessary.   Disclaimer: This note was dictated with voice recognition software. Similar sounding words can inadvertently be transcribed and may not be corrected upon review.

## 2019-07-25 ENCOUNTER — Telehealth: Payer: Self-pay | Admitting: Medical Oncology

## 2019-07-25 NOTE — Telephone Encounter (Signed)
Call [facility] for results [phone] ( option 2).-gave this information to [place].

## 2019-08-15 ENCOUNTER — Telehealth: Payer: Self-pay | Admitting: Medical Oncology

## 2019-08-15 MED FILL — TAGRISSO 80 MG TABLET: 80 | 30 days supply | Qty: 30 | Fill #2

## 2019-08-15 NOTE — Telephone Encounter (Signed)
Appts confirmed.

## 2019-08-22 ENCOUNTER — Inpatient Hospital Stay: Payer: Medicare Other | Attending: Internal Medicine | Admitting: Internal Medicine

## 2019-08-22 ENCOUNTER — Other Ambulatory Visit: Payer: Self-pay

## 2019-08-22 ENCOUNTER — Encounter: Payer: Self-pay | Admitting: Internal Medicine

## 2019-08-22 ENCOUNTER — Ambulatory Visit (HOSPITAL_COMMUNITY)
Admission: RE | Admit: 2019-08-22 | Discharge: 2019-08-22 | Disposition: A | Discharge status: Home / Self Care | Payer: Medicare Other | Source: Ambulatory Visit | Attending: Internal Medicine | Admitting: Internal Medicine

## 2019-08-22 ENCOUNTER — Telehealth: Payer: Self-pay | Admitting: Internal Medicine

## 2019-08-22 ENCOUNTER — Inpatient Hospital Stay: Payer: Medicare Other

## 2019-08-22 VITALS — BP 140/70 | HR 86 | Temp 98.5°F | Resp 18 | Ht 64.75 in | Wt 151.8 lb

## 2019-08-22 DIAGNOSIS — C3492 Malignant neoplasm of unspecified part of left bronchus or lung: Secondary | ICD-10-CM

## 2019-08-22 DIAGNOSIS — C3490 Malignant neoplasm of unspecified part of unspecified bronchus or lung: Secondary | ICD-10-CM

## 2019-08-22 DIAGNOSIS — K30 Functional dyspepsia: Secondary | ICD-10-CM | POA: Diagnosis not present

## 2019-08-22 DIAGNOSIS — Z5111 Encounter for antineoplastic chemotherapy: Secondary | ICD-10-CM | POA: Diagnosis not present

## 2019-08-22 DIAGNOSIS — Z902 Acquired absence of lung [part of]: Secondary | ICD-10-CM | POA: Insufficient documentation

## 2019-08-22 DIAGNOSIS — C3432 Malignant neoplasm of lower lobe, left bronchus or lung: Secondary | ICD-10-CM | POA: Diagnosis not present

## 2019-08-22 DIAGNOSIS — Z79899 Other long term (current) drug therapy: Secondary | ICD-10-CM | POA: Diagnosis not present

## 2019-08-22 DIAGNOSIS — C782 Secondary malignant neoplasm of pleura: Secondary | ICD-10-CM | POA: Insufficient documentation

## 2019-08-22 LAB — CMP (CANCER CENTER ONLY)
ALT: 15 U/L (ref 0–44)
AST: 20 U/L (ref 15–41)
Albumin: 3.7 g/dL (ref 3.5–5.0)
Alkaline Phosphatase: 50 U/L (ref 38–126)
Anion gap: 10 (ref 5–15)
BUN: 15 mg/dL (ref 8–23)
CO2: 25 mmol/L (ref 22–32)
Calcium: 8.8 mg/dL — ABNORMAL LOW (ref 8.9–10.3)
Chloride: 103 mmol/L (ref 98–111)
Creatinine: 0.81 mg/dL (ref 0.44–1.00)
GFR, Est AFR Am: >60 mL/min (ref >60)
GFR, Estimated: >60 mL/min (ref >60)
Glucose, Bld: 106 mg/dL — ABNORMAL HIGH (ref 70–99)
Potassium: 4.1 mmol/L (ref 3.5–5.1)
Sodium: 138 mmol/L (ref 135–145)
Total Bilirubin: 0.3 mg/dL (ref 0.3–1.2)
Total Protein: 6.7 g/dL (ref 6.5–8.1)

## 2019-08-22 LAB — CBC WITH DIFFERENTIAL (CANCER CENTER ONLY)
Abs Immature Granulocytes: 0.02 10*3/uL (ref 0.00–0.07)
Basophils Absolute: 0 10*3/uL (ref 0.0–0.1)
Basophils Relative: 0 %
Eosinophils Absolute: 0.1 10*3/uL (ref 0.0–0.5)
Eosinophils Relative: 1 %
HCT: 34.2 % — ABNORMAL LOW (ref 36.0–46.0)
Hemoglobin: 10.8 g/dL — ABNORMAL LOW (ref 12.0–15.0)
Immature Granulocytes: 0 %
Lymphocytes Relative: 31 %
Lymphs Abs: 1.8 10*3/uL (ref 0.7–4.0)
MCH: 29.5 pg (ref 26.0–34.0)
MCHC: 31.6 g/dL (ref 30.0–36.0)
MCV: 93.4 fL (ref 80.0–100.0)
Monocytes Absolute: 0.7 10*3/uL (ref 0.1–1.0)
Monocytes Relative: 12 %
Neutro Abs: 3.3 10*3/uL (ref 1.7–7.7)
Neutrophils Relative %: 56 %
Platelet Count: 229 10*3/uL (ref 150–400)
RBC: 3.66 MIL/uL — ABNORMAL LOW (ref 3.87–5.11)
RDW: 13.3 % (ref 11.5–15.5)
WBC Count: 6 10*3/uL (ref 4.0–10.5)
nRBC: 0 % (ref 0.0–0.2)

## 2019-08-22 NOTE — Progress Notes (Signed)
[place] Telephone:([phone]) [phone]   Fax:([phone]  OFFICE PROGRESS NOTE  Donald Prose, MD [place] [address]  DIAGNOSIS: Suspicious recurrent non-small cell lung cancer initially diagnosed as stage IIA (T2b, N0, M0) non-small cell lung cancer, adenocarcinoma diagnosed in November 2019  Molecular studies showed EGFR mutation in exon 21 (L858R) PD-L1 expression 0  PRIOR THERAPY: status post left lower lobectomy with lymph node dissection on April 25, 2018 under the care of Dr. Roxan Hockey.  She declined adjuvant therapy  CURRENT THERAPY: Osimertinib (Tagrisso) 80 mg p.o. daily.  First dose was on June 19, 2019.  Status post 2 months of treatment.  INTERVAL HISTORY: [name] 69 y.o. female returns to the clinic today for follow-up visit accompanied by her husband.  The patient is feeling much better today with less complaint compared to before.  She denied having any recent skin rash or diarrhea but actually has occasional constipation.  She denied having any chest pain, shortness of breath, cough or hemoptysis.  She denied having any fever or chills.  She has no weight loss or night sweats.  She has been tolerating her treatment with Tagrisso fairly well.  She stopped taking her supplements.  She was seen also recently by her primary care physician and he is considering her to start pravastatin for dyslipidemia and she is asking about our opinion.  She also was seen by Dr. Tammi Klippel at [facility] [place] in [place] and he is in agreement with our care plan.  She is also taking Carafate 3 times a day for indigestion and it did help her cough.  She had repeat CT scan of the chest performed earlier today and she is here for evaluation and discussion of her risk her results.  MEDICAL HISTORY: Past Medical History:  Diagnosis Date  . Anemia   . Atherosclerosis   . GERD (gastroesophageal reflux disease)   . Hemoptysis   .  Hyperlipidemia   . Hypertension   . IBS (irritable bowel syndrome)   . Lung mass   . Migraines   . Spondylosis   . Vitamin D deficiency     ALLERGIES:  is allergic to avocado; influenza vaccines; lisinopril; metoprolol; nexium [esomeprazole magnesium]; penicillins; pork-derived products; proton pump inhibitors; sulfites; wasp venom protein; zantac [ranitidine hcl]; cefaclor; chlorhexidine; chloroxine; ciprofloxacin hcl; clarithromycin; gatifloxacin; other; sulfa antibiotics; sulfamethoxazole-trimethoprim; eggs or egg-derived products; aspartame; chocolate; dilaudid [hydromorphone hcl]; fentanyl; ivp dye [iodinated diagnostic agents]; ketorolac; lactase; milk-related compounds; molds & smuts; oxycodone; prednisolone; shellfish allergy; and soap.  MEDICATIONS:  Current Outpatient Medications  Medication Sig Dispense Refill  . Carboxymethylcellul-Glycerin (LUBRICATING EYE DROPS OP) Place 1 drop into both eyes daily as needed (dry eyes).    . Cholecalciferol (VITAMIN D3) LIQD Take 4,000 Units by mouth daily.    . Coenzyme Q10 (COQ-10) 100 MG CAPS Take 200 mg by mouth daily.    Marland Kitchen diltiazem (TIAZAC) 180 MG 24 hr capsule Take 180 mg by mouth daily in the afternoon.     Marland Kitchen EPINEPHrine (EPIPEN 2-PAK) 0.3 mg/0.3 mL IJ SOAJ injection Inject 0.3 mg into the muscle as needed for anaphylaxis.     Marland Kitchen methylPREDNISolone (MEDROL DOSEPAK) 4 MG TBPK tablet Use as instructed. 21 tablet 0  . Omega-3 Fatty Acids (FISH OIL PO) Take 2 capsules by mouth daily.    Marland Kitchen osimertinib mesylate (TAGRISSO) 80 MG tablet Take 1 tablet (80 mg total) by mouth daily. 30 tablet 2  .  OVER THE COUNTER MEDICATION Triamcinolone, urea and menthol and camphor    . rizatriptan (MAXALT) 10 MG tablet Take 5 mg by mouth as needed for migraine. May repeat in 2 hours if needed     . sucralfate (CARAFATE) 1 g tablet Take 1 g by mouth See admin instructions. Crush and drink 1 g by mouth 2-3 times daily    . vitamin B-12 (CYANOCOBALAMIN) 1000 MCG  tablet Take 1,000 mcg by mouth daily.      No current facility-administered medications for this visit.    SURGICAL HISTORY:  Past Surgical History:  Procedure Laterality Date  . BROW LIFT AND BLEPHAROPLASTY  03/15/2013  . CATARACT EXTRACTION, BILATERAL  06/23/2014  . CHEST TUBE INSERTION Left 05/10/2018   Procedure: CHEST TUBE INSERTION;  Surgeon: [name], MD;  Location: [place] [facility];  Service: Thoracic;  Laterality: Left;  . COLONOSCOPY  05/2014  . ESOPHAGOGASTRODUODENOSCOPY  2019  . EYE SURGERY    . IR THORACENTESIS ASP PLEURAL SPACE W/IMG GUIDE  05/24/2018  . IR THORACENTESIS ASP PLEURAL SPACE W/IMG GUIDE  06/14/2018  . TONSILLECTOMY    . VIDEO ASSISTED THORACOSCOPY (VATS)/ LOBECTOMY Left 04/25/2018   Procedure: VIDEO ASSISTED THORACOSCOPY (VATS)LEFT LOWER LOBECTOMY, NODE DISSECTION;  Surgeon: [name], MD;  Location: [place];  Service: Thoracic;  Laterality: Left;  Marland Kitchen VIDEO BRONCHOSCOPY WITH ENDOBRONCHIAL NAVIGATION N/A 03/07/2018   Procedure: VIDEO BRONCHOSCOPY WITH ENDOBRONCHIAL NAVIGATION;  Surgeon: Collene Gobble, MD;  Location: [facility] OR;  Service: Thoracic;  Laterality: N/A;  . VIDEO BRONCHOSCOPY WITH INSERTION OF INTERBRONCHIAL VALVE (IBV) N/A 05/10/2018   Procedure: VIDEO BRONCHOSCOPY WITH INSERTION OF INTERBRONCHIAL VALVE (IBV);  Surgeon: [name], MD;  Location: [facility] OR;  Service: Thoracic;  Laterality: N/A;  . VIDEO BRONCHOSCOPY WITH INSERTION OF INTERBRONCHIAL VALVE (IBV) N/A 06/13/2018   Procedure: VIDEO BRONCHOSCOPY WITH REMOVAL OF INTERBRONCHIAL VALVE (IBV) x 2;  Surgeon: [name], MD;  Location: [facility];  Service: Thoracic;  Laterality: N/A;    REVIEW OF SYSTEMS:  Constitutional: negative Eyes: negative Ears, nose, mouth, throat, and face: negative Respiratory: positive for cough Cardiovascular: negative Gastrointestinal: positive for dyspepsia Genitourinary:negative Integument/breast: negative Hematologic/lymphatic:  negative Musculoskeletal:negative Neurological: negative Behavioral/Psych: negative Endocrine: negative Allergic/Immunologic: negative   PHYSICAL EXAMINATION: General appearance: alert, cooperative and no distress Head: Normocephalic, without obvious abnormality, atraumatic Neck: no adenopathy, no JVD, supple, symmetrical, trachea midline and thyroid not enlarged, symmetric, no tenderness/mass/nodules Lymph nodes: Cervical, supraclavicular, and axillary nodes normal. Resp: clear to auscultation bilaterally Back: symmetric, no curvature. ROM normal. No CVA tenderness. Cardio: regular rate and rhythm, S1, S2 normal, no murmur, click, rub or gallop GI: soft, non-tender; bowel sounds normal; no masses,  no organomegaly Extremities: extremities normal, atraumatic, no cyanosis or edema Neurologic: Alert and oriented X 3, normal strength and tone. Normal symmetric reflexes. Normal coordination and gait   ECOG PERFORMANCE STATUS: 1 - Symptomatic but completely ambulatory  Blood pressure 140/70, pulse 86, temperature 98.5 F (36.9 C), temperature source Temporal, resp. rate 18, height 5' 4.75" (1.645 m), weight 151 lb 12.8 oz (68.9 kg), SpO2 99 %.   LABORATORY DATA: Lab Results  Component Value Date   WBC 6.0 08/22/2019   HGB 10.8 (L) 08/22/2019   HCT 34.2 (L) 08/22/2019   MCV 93.4 08/22/2019   PLT 229 08/22/2019      Chemistry      Component Value Date/Time   NA 138 08/22/2019 1000   K 4.1 08/22/2019 1000   CL 103 08/22/2019  1000   CO2 25 08/22/2019 1000   BUN 15 08/22/2019 1000   CREATININE 0.81 08/22/2019 1000      Component Value Date/Time   CALCIUM 8.8 (L) 08/22/2019 1000   ALKPHOS 50 08/22/2019 1000   AST 20 08/22/2019 1000   ALT 15 08/22/2019 1000   BILITOT 0.3 08/22/2019 1000       RADIOGRAPHIC STUDIES: CT Chest Wo Contrast  Result Date: 08/22/2019 CLINICAL DATA:  Primary Cancer Type: Lung Imaging Indication: Restaging.  Recurrence. Interval therapy since last  imaging? Yes Initial Cancer Diagnosis Date: 03/07/2018    established by: Biopsy-proven Detailed Pathology: Stage IIA non-small cell lung cancer, adenocarcinoma Primary Tumor location: Left lower lobe Recurrence? Yes Date(s) of recurrence: 06/11/2019 Established by: Biopsy-proven Surgeries: Left lower lobectomy 04/25/2018. Chemotherapy: Osimertinib beginning 06/19/2019. Immunotherapy? No Radiation therapy? No EXAM: CT CHEST WITHOUT CONTRAST TECHNIQUE: Multidetector CT imaging of the chest was performed following the standard protocol without IV contrast. COMPARISON:  Most recent CT Chest 05/21/2019.  05/29/2019 PET-CT. FINDINGS: Cardiovascular: Normal heart size. No significant pericardial effusion/thickening. Left anterior descending coronary atherosclerosis. Atherosclerotic nonaneurysmal thoracic aorta. Normal caliber pulmonary arteries. Mediastinum/Nodes: No discrete thyroid nodules. Unremarkable esophagus. No pathologically enlarged axillary lymph nodes. Mildly enlarged high right paratracheal 1.0 cm lymph node (series 2/image 28), previously 0.8 cm on 05/21/2019 chest CT. No additional pathologically enlarged mediastinal lymph nodes. No discrete hilar adenopathy on this noncontrast scan. Lungs/Pleura: Status post left lower lobectomy. No pneumothorax. No right pleural effusion. Trace loculated left pleural effusion, decreased. Subsolid 0.9 cm right lower lobe pulmonary nodule (series 5/image 109), previously 1.4 cm, decreased. No acute consolidative airspace disease, lung masses or new significant pulmonary nodules. Scattered parenchymal bands in the mid to lower lungs, mildly increased in anterior right lower lobe. Previously a faintly visualized scattered solid left pleural masses have significantly decreased. For example posteromedial left pleural solid mass measures up to 0.5 cm thickness (series 2/image 74), previously 1.7 cm. Paramediastinal upper left pleural 0.4 cm thickness mass (series 2/image 40),  previously 0.7 cm. No new pleural implants. Upper abdomen: Small hiatal hernia. Musculoskeletal: No aggressive appearing focal osseous lesions. Mild thoracic spondylosis. IMPRESSION: 1. 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 Trace loculated left pleural effusion, decreased. Solitary subsolid right lower lobe pulmonary nodule is decreased. 2. Newly mildly enlarged high right paratracheal lymph node, cannot exclude a new nodal metastasis. No additional new or progressive findings in the chest. 3. Aortic Atherosclerosis (ICD10-I70.0). Electronically Signed   By: Ilona Sorrel M.D.   On: 08/22/2019 11:05    ASSESSMENT AND PLAN: This is a very pleasant 69 years old white female with a stage IIa non-small cell lung cancer, adenocarcinoma with positive EGFR mutation in exon 21 (L858R) status post left lower lobectomy with lymph node dissection under the care of Dr. Roxan Hockey. The patient is currently on observation.  She declined to proceed with adjuvant systemic chemotherapy or targeted therapy. The patient has been in observation since her surgical resection. Recent CT scan of the chest followed by PET scan showed suspicious pleural-based mass and nodularity in the left lung concerning for disease recurrence.  Her disease recurrence was confirmed with repeat biopsy of the pleural-based left lung mass. We had molecular studies on the new biopsy and it was consistent with EGFR mutation exon 21 (L858R). The patient started treatment with Tagrisso 80 mg p.o. daily.  The patient is status post 2 months of treatment and has been tolerating her treatment much better the last  several weeks. She had repeat CT scan of the chest performed earlier today.  I personally and independently reviewed the scan images and discussed the result and showed the images to the patient and her husband.  Her scan showed significant improvement in her disease with significant decrease in the  size of the left lower lobe pleural-based mass as well as the pleural effusion.  There was very minimal increase in the size of right paratracheal lymph node that need close monitoring on upcoming imaging studies. The patient had several questions about her current condition as well as her other home medication and interaction with Tagrisso and I answered them completely to her satisfaction. I will see her back for follow-up visit in 6 weeks for reevaluation and repeat blood work.  The patient was advised to call immediately if she has any concerning symptoms in the interval.  She will also reach out to Dr. Tammi Klippel in [place] if she has any urgent needs. The patient was also advised to call immediately if she has any other concerning symptoms in the interval. The patient voices understanding of current disease status and treatment options and is in agreement with the current care plan. All questions were answered. The patient knows to call the clinic with any problems, questions or concerns. We can certainly see the patient much sooner if necessary. The total time spent in the appointment was 45 minutes.  Disclaimer: This note was dictated with voice recognition software. Similar sounding words can inadvertently be transcribed and may not be corrected upon review.

## 2019-08-22 NOTE — Addendum Note (Signed)
Addended by: Ardeen Garland on: 08/22/2019 01:23 PM   Modules accepted: Orders

## 2019-08-22 NOTE — Telephone Encounter (Signed)
Scheduled per 05/06 los, patient is notified of upcoming appointments.

## 2019-09-10 ENCOUNTER — Other Ambulatory Visit: Payer: Self-pay | Admitting: Physician Assistant

## 2019-09-10 DIAGNOSIS — C3492 Malignant neoplasm of unspecified part of left bronchus or lung: Secondary | ICD-10-CM

## 2019-09-17 MED FILL — TAGRISSO 80 MG TABLET: 80 | 30 days supply | Qty: 30 | Fill #0

## 2019-10-01 ENCOUNTER — Telehealth: Payer: Self-pay | Admitting: Medical Oncology

## 2019-10-01 NOTE — Telephone Encounter (Signed)
Pt cancelled appt . Her husband has COVID . They both received the vaccines.

## 2019-10-02 ENCOUNTER — Telehealth: Payer: Self-pay | Admitting: Medical Oncology

## 2019-10-02 NOTE — Telephone Encounter (Signed)
Can she take regeneron if she is taking Tagrisso?   Her husband is COVID + and he is getting it.  Message to Pharmacy.

## 2019-10-02 NOTE — Telephone Encounter (Signed)
I do not have enough data to say yes or no but she does not need it if she does not have Covid.

## 2019-10-03 ENCOUNTER — Ambulatory Visit: Payer: Medicare Other | Admitting: Internal Medicine

## 2019-10-03 ENCOUNTER — Other Ambulatory Visit: Payer: Medicare Other

## 2019-10-04 ENCOUNTER — Telehealth: Payer: Self-pay | Admitting: Adult Health

## 2019-10-04 ENCOUNTER — Encounter: Payer: Self-pay | Admitting: Medical Oncology

## 2019-10-04 ENCOUNTER — Telehealth: Payer: Self-pay | Admitting: Internal Medicine

## 2019-10-04 ENCOUNTER — Telehealth: Payer: Self-pay | Admitting: Medical Oncology

## 2019-10-04 NOTE — Telephone Encounter (Signed)
Scheduled per 6/15 sch message. Pt unable to come in week of 7/12. Pt requested a week later. Messaged RN Diane about calling pt back. Pt stated that her husband tested positive for covid and she is getting tested today on 6/18.

## 2019-10-04 NOTE — Telephone Encounter (Signed)
Spent about 25 minutes on the phone with [name] talking to her about her COVID positivity and monoclonal antibody treatment.  She became symptomatic on Monday.  She tested positive for COVID today at [facility], as she had previously been negative.  She wants to know if she should get the monoclonal antibody infusion due to her history of lung cancer, and what she should do about her Tagrisso medication therapy.  I let her know that based on her age and h/o lung cancer, she does meet criteria to receive monoclonal antibody therapy and it reduces the risk of hospitalization and/or complications in patients who are in at risk categories.  I reviewed with her common adverse effects.  After thorough discussion, she has decided that she would like to proceed with therapy and is going to work to get in for treatment at [facility].  Her positivity has been reported as a break through by [facility] where she was tested.    I have reached out to our [facility] team and asked the question about the [place] and am waiting to hear back.  For right now, and based on my review of her recent lab testing, I recommended that she continue this.   I gave [name] my direct office number to call me for any questions or concerns tonight or over the weekend.  She was very appreciative of my call.    Wilber Bihari, NP

## 2019-10-04 NOTE — Telephone Encounter (Signed)
Referred pt to [facility] re pts COVID questions.

## 2019-10-14 MED FILL — TAGRISSO 80 MG TABLET: 80 | 30 days supply | Qty: 30 | Fill #1

## 2019-11-04 ENCOUNTER — Encounter: Payer: Self-pay | Admitting: Internal Medicine

## 2019-11-04 ENCOUNTER — Telehealth: Payer: Self-pay | Admitting: Internal Medicine

## 2019-11-04 ENCOUNTER — Other Ambulatory Visit: Payer: Self-pay

## 2019-11-04 ENCOUNTER — Telehealth: Payer: Self-pay | Admitting: Medical Oncology

## 2019-11-04 ENCOUNTER — Other Ambulatory Visit: Payer: Self-pay | Admitting: Internal Medicine

## 2019-11-04 ENCOUNTER — Inpatient Hospital Stay: Payer: Medicare Other

## 2019-11-04 ENCOUNTER — Inpatient Hospital Stay: Payer: Medicare Other | Attending: Internal Medicine | Admitting: Internal Medicine

## 2019-11-04 VITALS — BP 142/67 | HR 99 | Temp 97.9°F | Resp 18 | Wt 145.6 lb

## 2019-11-04 DIAGNOSIS — E785 Hyperlipidemia, unspecified: Secondary | ICD-10-CM | POA: Insufficient documentation

## 2019-11-04 DIAGNOSIS — C3492 Malignant neoplasm of unspecified part of left bronchus or lung: Secondary | ICD-10-CM

## 2019-11-04 DIAGNOSIS — Z5111 Encounter for antineoplastic chemotherapy: Secondary | ICD-10-CM | POA: Diagnosis not present

## 2019-11-04 DIAGNOSIS — C3490 Malignant neoplasm of unspecified part of unspecified bronchus or lung: Secondary | ICD-10-CM

## 2019-11-04 DIAGNOSIS — K219 Gastro-esophageal reflux disease without esophagitis: Secondary | ICD-10-CM | POA: Diagnosis not present

## 2019-11-04 DIAGNOSIS — C3432 Malignant neoplasm of lower lobe, left bronchus or lung: Secondary | ICD-10-CM | POA: Insufficient documentation

## 2019-11-04 DIAGNOSIS — Z79899 Other long term (current) drug therapy: Secondary | ICD-10-CM | POA: Diagnosis not present

## 2019-11-04 DIAGNOSIS — I10 Essential (primary) hypertension: Secondary | ICD-10-CM | POA: Insufficient documentation

## 2019-11-04 DIAGNOSIS — K589 Irritable bowel syndrome without diarrhea: Secondary | ICD-10-CM | POA: Diagnosis not present

## 2019-11-04 LAB — CMP (CANCER CENTER ONLY)
ALT: 14 U/L (ref 0–44)
AST: 22 U/L (ref 15–41)
Albumin: 4 g/dL (ref 3.5–5.0)
Alkaline Phosphatase: 52 U/L (ref 38–126)
Anion gap: 9 (ref 5–15)
BUN: 17 mg/dL (ref 8–23)
CO2: 26 mmol/L (ref 22–32)
Calcium: 9.3 mg/dL (ref 8.9–10.3)
Chloride: 103 mmol/L (ref 98–111)
Creatinine: 0.84 mg/dL (ref 0.44–1.00)
GFR, Est AFR Am: >60 mL/min (ref >60)
GFR, Estimated: >60 mL/min (ref >60)
Glucose, Bld: 98 mg/dL (ref 70–99)
Potassium: 4 mmol/L (ref 3.5–5.1)
Sodium: 138 mmol/L (ref 135–145)
Total Bilirubin: 0.4 mg/dL (ref 0.3–1.2)
Total Protein: 6.9 g/dL (ref 6.5–8.1)

## 2019-11-04 LAB — CBC WITH DIFFERENTIAL (CANCER CENTER ONLY)
Abs Immature Granulocytes: 0.01 10*3/uL (ref 0.00–0.07)
Basophils Absolute: 0 10*3/uL (ref 0.0–0.1)
Basophils Relative: 1 %
Eosinophils Absolute: 0.1 10*3/uL (ref 0.0–0.5)
Eosinophils Relative: 2 %
HCT: 34.1 % — ABNORMAL LOW (ref 36.0–46.0)
Hemoglobin: 10.8 g/dL — ABNORMAL LOW (ref 12.0–15.0)
Immature Granulocytes: 0 %
Lymphocytes Relative: 29 %
Lymphs Abs: 1.9 10*3/uL (ref 0.7–4.0)
MCH: 29.6 pg (ref 26.0–34.0)
MCHC: 31.7 g/dL (ref 30.0–36.0)
MCV: 93.4 fL (ref 80.0–100.0)
Monocytes Absolute: 0.8 10*3/uL (ref 0.1–1.0)
Monocytes Relative: 12 %
Neutro Abs: 3.7 10*3/uL (ref 1.7–7.7)
Neutrophils Relative %: 56 %
Platelet Count: 249 10*3/uL (ref 150–400)
RBC: 3.65 MIL/uL — ABNORMAL LOW (ref 3.87–5.11)
RDW: 13.2 % (ref 11.5–15.5)
WBC Count: 6.5 10*3/uL (ref 4.0–10.5)
nRBC: 0 % (ref 0.0–0.2)

## 2019-11-04 NOTE — Telephone Encounter (Signed)
Just to make sure no spread to any other areas in the abdomen or pelvis but if she is worried about drinking the contrast we can cancel it.

## 2019-11-04 NOTE — Telephone Encounter (Signed)
Why is a CT A/P ordered? I have never had one.

## 2019-11-04 NOTE — Telephone Encounter (Signed)
Scheduled per los. Declined printout  

## 2019-11-04 NOTE — Progress Notes (Signed)
[facility] [phone]   [phone]  OFFICE PROGRESS NOTE  Donald Prose, MD [place] [address]  DIAGNOSIS: Suspicious recurrent non-small cell lung cancer initially diagnosed as stage IIA (T2b, N0, M0) non-small cell lung cancer, adenocarcinoma diagnosed in November 2019  Molecular studies showed EGFR mutation in exon 21 (L858R) PD-L1 expression 0  PRIOR THERAPY: status post left lower lobectomy with lymph node dissection on April 25, 2018 under the care of Dr. Roxan Hockey.  She declined adjuvant therapy  CURRENT THERAPY: Osimertinib (Tagrisso) 80 mg p.o. daily.  First dose was on June 19, 2019.  Status post 4 months of treatment.  INTERVAL HISTORY: [name] 69 y.o. female returns to the clinic today for follow-up visit accompanied by her husband.  The patient is feeling fine today with no concerning complaints except for mild shortness of breath.  She was recently diagnosed with COVID-19 as well as her husband.  She was treated with the Regeneron antibody.  She has already been vaccinated for COVID-19 before her diagnosis with the recent COVID-19 infection.  She denied having any chest pain, cough or hemoptysis.  She denied having any nausea, vomiting, diarrhea or constipation.  She denied having any headache or visual changes.  She has no recent weight loss or night sweats.  MEDICAL HISTORY: Past Medical History:  Diagnosis Date  . Anemia   . Atherosclerosis   . GERD (gastroesophageal reflux disease)   . Hemoptysis   . Hyperlipidemia   . Hypertension   . IBS (irritable bowel syndrome)   . Lung mass   . Migraines   . Spondylosis   . Vitamin D deficiency     ALLERGIES:  is allergic to avocado, influenza vaccines, lisinopril, metoprolol, nexium [esomeprazole magnesium], penicillins, pork-derived products, proton pump inhibitors, sulfites, wasp venom protein, zantac [ranitidine hcl], cefaclor, chlorhexidine,  chloroxine, ciprofloxacin hcl, clarithromycin, gatifloxacin, other, sulfa antibiotics, sulfamethoxazole-trimethoprim, eggs or egg-derived products, aspartame, chocolate, dilaudid [hydromorphone hcl], fentanyl, ivp dye [iodinated diagnostic agents], ketorolac, lactase, milk-related compounds, molds & smuts, oxycodone, prednisolone, shellfish allergy, and soap.  MEDICATIONS:  Current Outpatient Medications  Medication Sig Dispense Refill  . Carboxymethylcellul-Glycerin (LUBRICATING EYE DROPS OP) Place 1 drop into both eyes daily as needed (dry eyes).    . Cholecalciferol (VITAMIN D3) LIQD Take 4,000 Units by mouth daily.    . Coenzyme Q10 (COQ-10) 100 MG CAPS Take 200 mg by mouth daily.    Marland Kitchen diltiazem (TIAZAC) 180 MG 24 hr capsule Take 180 mg by mouth daily in the afternoon.     Marland Kitchen EPINEPHrine (EPIPEN 2-PAK) 0.3 mg/0.3 mL IJ SOAJ injection Inject 0.3 mg into the muscle as needed for anaphylaxis.     . Omega-3 Fatty Acids (FISH OIL PO) Take 2 capsules by mouth daily.    Marland Kitchen OVER THE COUNTER MEDICATION Triamcinolone, urea and menthol and camphor    . PRESCRIPTION MEDICATION Apply 1 application topically 2 (two) times daily as needed for itching. Dr Molinda Bailiff 2019    . rizatriptan (MAXALT) 10 MG tablet Take 5 mg by mouth as needed for migraine. May repeat in 2 hours if needed     . sucralfate (CARAFATE) 1 g tablet Take 1 g by mouth See admin instructions. Crush and drink 1 g by mouth 2-3 times daily    . TAGRISSO 80 MG tablet TAKE 1 TABLET (80 MG TOTAL) BY MOUTH DAILY. 30 tablet 2  . vitamin B-12 (CYANOCOBALAMIN) 1000 MCG tablet Take  1,000 mcg by mouth daily.      No current facility-administered medications for this visit.    SURGICAL HISTORY:  Past Surgical History:  Procedure Laterality Date  . BROW LIFT AND BLEPHAROPLASTY  03/15/2013  . CATARACT EXTRACTION, BILATERAL  06/23/2014  . CHEST TUBE INSERTION Left 05/10/2018   Procedure: CHEST TUBE INSERTION;  Surgeon: [name], MD;   Location: [place];  Service: Thoracic;  Laterality: Left;  . COLONOSCOPY  05/2014  . ESOPHAGOGASTRODUODENOSCOPY  2019  . EYE SURGERY    . IR THORACENTESIS ASP PLEURAL SPACE W/IMG GUIDE  05/24/2018  . IR THORACENTESIS ASP PLEURAL SPACE W/IMG GUIDE  06/14/2018  . TONSILLECTOMY    . VIDEO ASSISTED THORACOSCOPY (VATS)/ LOBECTOMY Left 04/25/2018   Procedure: VIDEO ASSISTED THORACOSCOPY (VATS)LEFT LOWER LOBECTOMY, NODE DISSECTION;  Surgeon: [name], MD;  Location: [place];  Service: Thoracic;  Laterality: Left;  Marland Kitchen VIDEO BRONCHOSCOPY WITH ENDOBRONCHIAL NAVIGATION N/A 03/07/2018   Procedure: VIDEO BRONCHOSCOPY WITH ENDOBRONCHIAL NAVIGATION;  Surgeon: Collene Gobble, MD;  Location: [facility] OR;  Service: Thoracic;  Laterality: N/A;  . VIDEO BRONCHOSCOPY WITH INSERTION OF INTERBRONCHIAL VALVE (IBV) N/A 05/10/2018   Procedure: VIDEO BRONCHOSCOPY WITH INSERTION OF INTERBRONCHIAL VALVE (IBV);  Surgeon: [name], MD;  Location: [facility] OR;  Service: Thoracic;  Laterality: N/A;  . VIDEO BRONCHOSCOPY WITH INSERTION OF INTERBRONCHIAL VALVE (IBV) N/A 06/13/2018   Procedure: VIDEO BRONCHOSCOPY WITH REMOVAL OF INTERBRONCHIAL VALVE (IBV) x 2;  Surgeon: [name], MD;  Location: [place];  Service: Thoracic;  Laterality: N/A;    REVIEW OF SYSTEMS:  Constitutional: positive for fatigue Eyes: negative Ears, nose, mouth, throat, and face: negative Respiratory: positive for dyspnea on exertion Cardiovascular: negative Gastrointestinal: positive for dyspepsia Genitourinary:negative Integument/breast: negative Hematologic/lymphatic: negative Musculoskeletal:negative Neurological: negative Behavioral/Psych: negative Endocrine: negative Allergic/Immunologic: negative   PHYSICAL EXAMINATION: General appearance: alert, cooperative and no distress Head: Normocephalic, without obvious abnormality, atraumatic Neck: no adenopathy, no JVD, supple, symmetrical, trachea midline and thyroid not enlarged,  symmetric, no tenderness/mass/nodules Lymph nodes: Cervical, supraclavicular, and axillary nodes normal. Resp: clear to auscultation bilaterally Back: symmetric, no curvature. ROM normal. No CVA tenderness. Cardio: regular rate and rhythm, S1, S2 normal, no murmur, click, rub or gallop GI: soft, non-tender; bowel sounds normal; no masses,  no organomegaly Extremities: extremities normal, atraumatic, no cyanosis or edema Neurologic: Alert and oriented X 3, normal strength and tone. Normal symmetric reflexes. Normal coordination and gait   ECOG PERFORMANCE STATUS: 1 - Symptomatic but completely ambulatory  Blood pressure (!) 142/67, pulse 99, temperature 97.9 F (36.6 C), temperature source Oral, resp. rate 18, weight 145 lb 9.6 oz (66 kg), SpO2 99 %.   LABORATORY DATA: Lab Results  Component Value Date   WBC 6.0 08/22/2019   HGB 10.8 (L) 08/22/2019   HCT 34.2 (L) 08/22/2019   MCV 93.4 08/22/2019   PLT 229 08/22/2019      Chemistry      Component Value Date/Time   NA 138 08/22/2019 1000   K 4.1 08/22/2019 1000   CL 103 08/22/2019 1000   CO2 25 08/22/2019 1000   BUN 15 08/22/2019 1000   CREATININE 0.81 08/22/2019 1000      Component Value Date/Time   CALCIUM 8.8 (L) 08/22/2019 1000   ALKPHOS 50 08/22/2019 1000   AST 20 08/22/2019 1000   ALT 15 08/22/2019 1000   BILITOT 0.3 08/22/2019 1000       RADIOGRAPHIC STUDIES: No results found.  ASSESSMENT AND PLAN: This  is a very pleasant 69 years old white female with a stage IIa non-small cell lung cancer, adenocarcinoma with positive EGFR mutation in exon 21 (L858R) status post left lower lobectomy with lymph node dissection under the care of Dr. Roxan Hockey. The patient is currently on observation.  She declined to proceed with adjuvant systemic chemotherapy or targeted therapy. The patient has been in observation since her surgical resection. Recent CT scan of the chest followed by PET scan showed suspicious pleural-based  mass and nodularity in the left lung concerning for disease recurrence.  Her disease recurrence was confirmed with repeat biopsy of the pleural-based left lung mass. We had molecular studies on the new biopsy and it was consistent with EGFR mutation exon 21 (L858R). The patient started treatment with Tagrisso 80 mg p.o. daily.  The patient is status post 4 months of treatment.  The patient has been tolerating this treatment well with no concerning adverse effects. I recommended for her to continue her current treatment with Tagrisso with the same dose. I will arrange for the patient to have repeat CT scan of the chest, abdomen and pelvis in 3 weeks for restaging of her disease. She was advised to call immediately if she has any concerning symptoms in the interval. The patient voices understanding of current disease status and treatment options and is in agreement with the current care plan. All questions were answered. The patient knows to call the clinic with any problems, questions or concerns. We can certainly see the patient much sooner if necessary. The total time spent in the appointment was 45 minutes.  Disclaimer: This note was dictated with voice recognition software. Similar sounding words can inadvertently be transcribed and may not be corrected upon review.

## 2019-11-06 NOTE — Telephone Encounter (Signed)
Pt notified. " I am not going to take any contrast ". Noted.

## 2019-11-11 MED FILL — TAGRISSO 80 MG TABLET: 80 | 30 days supply | Qty: 30 | Fill #2

## 2019-11-19 ENCOUNTER — Ambulatory Visit: Payer: Medicare Other | Admitting: Thoracic Surgery (Cardiothoracic Vascular Surgery)

## 2019-11-26 ENCOUNTER — Encounter: Payer: Self-pay | Admitting: Thoracic Surgery (Cardiothoracic Vascular Surgery)

## 2019-11-26 ENCOUNTER — Other Ambulatory Visit: Payer: Self-pay

## 2019-11-26 ENCOUNTER — Inpatient Hospital Stay: Payer: Medicare Other | Attending: Internal Medicine | Admitting: Internal Medicine

## 2019-11-26 ENCOUNTER — Encounter: Payer: Self-pay | Admitting: Internal Medicine

## 2019-11-26 ENCOUNTER — Ambulatory Visit (HOSPITAL_COMMUNITY)
Admission: RE | Admit: 2019-11-26 | Discharge: 2019-11-26 | Disposition: A | Discharge status: Home / Self Care | Payer: Medicare Other | Source: Ambulatory Visit | Attending: Internal Medicine | Admitting: Internal Medicine

## 2019-11-26 ENCOUNTER — Ambulatory Visit (INDEPENDENT_AMBULATORY_CARE_PROVIDER_SITE_OTHER): Payer: Medicare Other | Admitting: Thoracic Surgery (Cardiothoracic Vascular Surgery)

## 2019-11-26 ENCOUNTER — Ambulatory Visit (HOSPITAL_COMMUNITY): Payer: Medicare Other

## 2019-11-26 ENCOUNTER — Inpatient Hospital Stay: Payer: Medicare Other

## 2019-11-26 VITALS — BP 145/78 | HR 82 | Temp 98.1°F | Resp 20 | Ht 64.75 in | Wt 144.0 lb

## 2019-11-26 VITALS — BP 133/66 | HR 97 | Temp 98.3°F | Resp 18 | Ht 64.75 in | Wt 144.6 lb

## 2019-11-26 DIAGNOSIS — C3432 Malignant neoplasm of lower lobe, left bronchus or lung: Secondary | ICD-10-CM | POA: Insufficient documentation

## 2019-11-26 DIAGNOSIS — L270 Generalized skin eruption due to drugs and medicaments taken internally: Secondary | ICD-10-CM

## 2019-11-26 DIAGNOSIS — Z79899 Other long term (current) drug therapy: Secondary | ICD-10-CM | POA: Insufficient documentation

## 2019-11-26 DIAGNOSIS — C3492 Malignant neoplasm of unspecified part of left bronchus or lung: Secondary | ICD-10-CM

## 2019-11-26 DIAGNOSIS — Z902 Acquired absence of lung [part of]: Secondary | ICD-10-CM | POA: Diagnosis not present

## 2019-11-26 DIAGNOSIS — I10 Essential (primary) hypertension: Secondary | ICD-10-CM | POA: Insufficient documentation

## 2019-11-26 DIAGNOSIS — Z5111 Encounter for antineoplastic chemotherapy: Secondary | ICD-10-CM

## 2019-11-26 DIAGNOSIS — C3490 Malignant neoplasm of unspecified part of unspecified bronchus or lung: Secondary | ICD-10-CM

## 2019-11-26 LAB — CBC WITH DIFFERENTIAL (CANCER CENTER ONLY)
Abs Immature Granulocytes: 0.02 10*3/uL (ref 0.00–0.07)
Basophils Absolute: 0 10*3/uL (ref 0.0–0.1)
Basophils Relative: 0 %
Eosinophils Absolute: 0.1 10*3/uL (ref 0.0–0.5)
Eosinophils Relative: 2 %
HCT: 33 % — ABNORMAL LOW (ref 36.0–46.0)
Hemoglobin: 10.5 g/dL — ABNORMAL LOW (ref 12.0–15.0)
Immature Granulocytes: 0 %
Lymphocytes Relative: 30 %
Lymphs Abs: 1.8 10*3/uL (ref 0.7–4.0)
MCH: 29.2 pg (ref 26.0–34.0)
MCHC: 31.8 g/dL (ref 30.0–36.0)
MCV: 91.9 fL (ref 80.0–100.0)
Monocytes Absolute: 0.7 10*3/uL (ref 0.1–1.0)
Monocytes Relative: 13 %
Neutro Abs: 3.3 10*3/uL (ref 1.7–7.7)
Neutrophils Relative %: 55 %
Platelet Count: 214 10*3/uL (ref 150–400)
RBC: 3.59 MIL/uL — ABNORMAL LOW (ref 3.87–5.11)
RDW: 13.5 % (ref 11.5–15.5)
WBC Count: 5.9 10*3/uL (ref 4.0–10.5)
nRBC: 0 % (ref 0.0–0.2)

## 2019-11-26 LAB — CMP (CANCER CENTER ONLY)
ALT: 16 U/L (ref 0–44)
AST: 23 U/L (ref 15–41)
Albumin: 3.8 g/dL (ref 3.5–5.0)
Alkaline Phosphatase: 55 U/L (ref 38–126)
Anion gap: 8 (ref 5–15)
BUN: 15 mg/dL (ref 8–23)
CO2: 25 mmol/L (ref 22–32)
Calcium: 9.4 mg/dL (ref 8.9–10.3)
Chloride: 103 mmol/L (ref 98–111)
Creatinine: 0.86 mg/dL (ref 0.44–1.00)
GFR, Est AFR Am: >60 mL/min (ref >60)
GFR, Estimated: >60 mL/min (ref >60)
Glucose, Bld: 101 mg/dL — ABNORMAL HIGH (ref 70–99)
Potassium: 4.1 mmol/L (ref 3.5–5.1)
Sodium: 136 mmol/L (ref 135–145)
Total Bilirubin: 0.4 mg/dL (ref 0.3–1.2)
Total Protein: 6.7 g/dL (ref 6.5–8.1)

## 2019-11-26 NOTE — Progress Notes (Signed)
[address]       [place] [address]             [phone]      HPI: [name] returns for a scheduled follow-up visit  [name] is a 69 year old woman with a history of hypertension, hyperlipidemia, reflux, aortic atherosclerosis, migraines, anemia, numerous medication allergies, and stage IIa (T2, N0) adenocarcinoma of the left lower lobe.  She had a thoracoscopic left lower lobectomy on 04/25/2018.  Postoperative course was complicated by prolonged air leak.  Postoperatively she elected not to have chemotherapy or participate in a trial of targeted therapy.  I last saw her in February 2021.  She was feeling well at that time, but her CT of the chest showed some abnormalities.  She had a PET/CT which led to a needle biopsy which confirmed recurrence of her cancer.  Molecular testing was positive for EGFR.  She was started on Tagrisso.  She had Covid about 2 months ago.  Her sense of smell is never quite returned.  She has been having a lot of problems with reflux which she thinks Tagrisso aggravates.  She takes Carafate for that.  She cannot take PPI or H2 blockers.  She saw Dr. [name] earlier today.  Past Medical History:  Diagnosis Date  . Anemia   . Atherosclerosis   . GERD (gastroesophageal reflux disease)   . Hemoptysis   . Hyperlipidemia   . Hypertension   . IBS (irritable bowel syndrome)   . Lung mass   . Migraines   . Spondylosis   . Vitamin D deficiency     Current Outpatient Medications  Medication Sig Dispense Refill  . Carboxymethylcellul-Glycerin (LUBRICATING EYE DROPS OP) Place 1 drop into both eyes daily as needed (dry eyes).    . Cholecalciferol (VITAMIN D3) LIQD Take 4,000 Units by mouth daily.    . Coenzyme Q10 (COQ-10) 100 MG CAPS Take 200 mg by mouth daily.    Marland Kitchen diltiazem (TIAZAC) 180 MG 24 hr capsule Take 180 mg by mouth daily in the afternoon.     Marland Kitchen EPINEPHrine (EPIPEN 2-PAK) 0.3 mg/0.3 mL IJ SOAJ injection Inject 0.3 mg into  the muscle as needed for anaphylaxis.     . Omega-3 Fatty Acids (FISH OIL PO) Take 2 capsules by mouth daily.    Marland Kitchen OVER THE COUNTER MEDICATION Triamcinolone, urea and menthol and camphor    . pravastatin (PRAVACHOL) 10 MG tablet Take 5 mg by mouth every other day. Takes 5 mg's every other day    . PRESCRIPTION MEDICATION Apply 1 application topically 2 (two) times daily as needed for itching. Dr Molinda Bailiff 2019    . rizatriptan (MAXALT) 10 MG tablet Take 5 mg by mouth as needed for migraine. May repeat in 2 hours if needed     . sucralfate (CARAFATE) 1 g tablet Take 1 g by mouth See admin instructions. Crush and drink 1 g by mouth 2-3 times daily    . TAGRISSO 80 MG tablet TAKE 1 TABLET (80 MG TOTAL) BY MOUTH DAILY. 30 tablet 2  . vitamin B-12 (CYANOCOBALAMIN) 1000 MCG tablet Take 1,000 mcg by mouth daily.      No current facility-administered medications for this visit.    Physical Exam BP (!) 145/78   Pulse 82   Temp 98.1 F (36.7 C) (Skin)   Resp 20   Ht 5' 4.75" (1.645 m)   Wt 144 lb (65.3 kg)   SpO2 98%  Comment: RA  BMI 24.77 kg/m  69 year old woman in no acute distress Alert and oriented x3 with no focal deficits Wearing surgical mask No cervical or supraclavicular adenopathy Cardiac regular rate and rhythm Lungs diminished at left base otherwise clear  Diagnostic Tests: CT chest abdomen pelvis 11/26/2018 IMPRESSION: 1. Continued further decrease in pleural based soft tissue nodules in the left hemithorax. No new or progressive findings on today's study. 2. Sub solid right lower lobe pulmonary nodule has essentially resolved in the interval with no residual measurable soft tissue at this location today. 3. The high right paratracheal lymph node described as mildly enlarged on the previous study has decreased in size in the interval now measuring 8 mm short axis, within normal limits. Continued attention on follow-up suggested. 4. Mild circumferential wall thickening  distal esophagus. Esophagitis could have this appearance. Neoplasm considered unlikely although there was some described FDG uptake in this region on the PET-CT of 05/29/2019, which could be related to inflammation. Upper endoscopy could be used to further evaluate as clinically warranted. 5. Aortic Atherosclerosis (ICD10-I70.0).   Electronically Signed   By: Misty Stanley M.D.   On: 11/26/2019 14:08 I personally reviewed the CT chest images and concur with the findings as noted above.  Impression: [name] is a 69 year old non-smoker who had a stage IIa (T2, N0) adenocarcinoma of the left lower lobe.  She had surgical resection but unfortunately had a recurrence.  She was started on Tagrisso for that.  Her CT today shows remarkable response is more.  It is essentially a complete response.  She is aware that sometimes the tumor can recur even after that.  She did have some thickening of her distal esophagus.  She has severe reflux and cannot take any acid inhibiting medications.  She is using Carafate for that which helps sometimes.  Recommended she follow-up with her gastroenterologist.  He had retired but she can follow-up with someone else in his practice.  She is not interested in having an endoscopy.  She is not having any respiratory issues.  She will see Dr. [name] back in 3 months.  Plan: Return in 6 months.  If she is doing well at that time she can call and cancel.  [name], MD [facility] [phone]

## 2019-11-26 NOTE — Progress Notes (Signed)
[facility] [phone]   [phone]) [phone]  OFFICE PROGRESS NOTE  Donald Prose, MD [place] [address]  DIAGNOSIS: Suspicious recurrent non-small cell lung cancer initially diagnosed as stage IIA (T2b, N0, M0) non-small cell lung cancer, adenocarcinoma diagnosed in November 2019  Molecular studies showed EGFR mutation in exon 21 (L858R) PD-L1 expression 0  PRIOR THERAPY: status post left lower lobectomy with lymph node dissection on April 25, 2018 under the care of Dr. Roxan Hockey.  She declined adjuvant therapy  CURRENT THERAPY: Osimertinib (Tagrisso) 80 mg p.o. daily.  First dose was on June 19, 2019.  Status post 5 months of treatment.  INTERVAL HISTORY: [name] 69 y.o. female returns to the clinic today for follow-up visit accompanied by her husband.  The patient is feeling fine today with no concerning complaints except for occasional spots of skin rash on the fingers and back.  She also had one episode of diarrhea last week.  She denied having any current chest pain, shortness of breath, cough or hemoptysis.  She denied having any fever or chills.  She has no nausea, vomiting, diarrhea or constipation.  She denied having any headache or visual changes.  She continues to tolerate her treatment with Tagrisso fairly well.  The patient had repeat CT scan of the chest, abdomen pelvis performed recently and she is here for evaluation and discussion of her risk her results.  MEDICAL HISTORY: Past Medical History:  Diagnosis Date  . Anemia   . Atherosclerosis   . GERD (gastroesophageal reflux disease)   . Hemoptysis   . Hyperlipidemia   . Hypertension   . IBS (irritable bowel syndrome)   . Lung mass   . Migraines   . Spondylosis   . Vitamin D deficiency     ALLERGIES:  is allergic to avocado, influenza vaccines, lisinopril, metoprolol, nexium [esomeprazole magnesium], penicillins, pork-derived products, proton pump  inhibitors, sulfites, wasp venom protein, zantac [ranitidine hcl], cefaclor, chlorhexidine, chloroxine, ciprofloxacin hcl, clarithromycin, gatifloxacin, other, sulfa antibiotics, sulfamethoxazole-trimethoprim, eggs or egg-derived products, aspartame, chocolate, dilaudid [hydromorphone hcl], fentanyl, ivp dye [iodinated diagnostic agents], ketorolac, lactase, milk-related compounds, molds & smuts, oxycodone, prednisolone, shellfish allergy, and soap.  MEDICATIONS:  Current Outpatient Medications  Medication Sig Dispense Refill  . Carboxymethylcellul-Glycerin (LUBRICATING EYE DROPS OP) Place 1 drop into both eyes daily as needed (dry eyes).    . Cholecalciferol (VITAMIN D3) LIQD Take 4,000 Units by mouth daily.    . Coenzyme Q10 (COQ-10) 100 MG CAPS Take 200 mg by mouth daily.    Marland Kitchen diltiazem (TIAZAC) 180 MG 24 hr capsule Take 180 mg by mouth daily in the afternoon.     Marland Kitchen EPINEPHrine (EPIPEN 2-PAK) 0.3 mg/0.3 mL IJ SOAJ injection Inject 0.3 mg into the muscle as needed for anaphylaxis.     . Omega-3 Fatty Acids (FISH OIL PO) Take 2 capsules by mouth daily.    Marland Kitchen OVER THE COUNTER MEDICATION Triamcinolone, urea and menthol and camphor    . PRESCRIPTION MEDICATION Apply 1 application topically 2 (two) times daily as needed for itching. Dr Molinda Bailiff 2019    . rizatriptan (MAXALT) 10 MG tablet Take 5 mg by mouth as needed for migraine. May repeat in 2 hours if needed     . sucralfate (CARAFATE) 1 g tablet Take 1 g by mouth See admin instructions. Crush and drink 1 g by mouth 2-3 times daily    . TAGRISSO 80 MG tablet TAKE 1  TABLET (80 MG TOTAL) BY MOUTH DAILY. 30 tablet 2  . vitamin B-12 (CYANOCOBALAMIN) 1000 MCG tablet Take 1,000 mcg by mouth daily.      No current facility-administered medications for this visit.    SURGICAL HISTORY:  Past Surgical History:  Procedure Laterality Date  . BROW LIFT AND BLEPHAROPLASTY  03/15/2013  . CATARACT EXTRACTION, BILATERAL  06/23/2014  . CHEST TUBE INSERTION  Left 05/10/2018   Procedure: CHEST TUBE INSERTION;  Surgeon: [name], MD;  Location: [facility];  Service: Thoracic;  Laterality: Left;  . COLONOSCOPY  05/2014  . ESOPHAGOGASTRODUODENOSCOPY  2019  . EYE SURGERY    . IR THORACENTESIS ASP PLEURAL SPACE W/IMG GUIDE  05/24/2018  . IR THORACENTESIS ASP PLEURAL SPACE W/IMG GUIDE  06/14/2018  . TONSILLECTOMY    . VIDEO ASSISTED THORACOSCOPY (VATS)/ LOBECTOMY Left 04/25/2018   Procedure: VIDEO ASSISTED THORACOSCOPY (VATS)LEFT LOWER LOBECTOMY, NODE DISSECTION;  Surgeon: [name], MD;  Location: [facility];  Service: Thoracic;  Laterality: Left;  Marland Kitchen VIDEO BRONCHOSCOPY WITH ENDOBRONCHIAL NAVIGATION N/A 03/07/2018   Procedure: VIDEO BRONCHOSCOPY WITH ENDOBRONCHIAL NAVIGATION;  Surgeon: Collene Gobble, MD;  Location: [facility];  Service: Thoracic;  Laterality: N/A;  . VIDEO BRONCHOSCOPY WITH INSERTION OF INTERBRONCHIAL VALVE (IBV) N/A 05/10/2018   Procedure: VIDEO BRONCHOSCOPY WITH INSERTION OF INTERBRONCHIAL VALVE (IBV);  Surgeon: [name], MD;  Location: [facility] OR;  Service: Thoracic;  Laterality: N/A;  . VIDEO BRONCHOSCOPY WITH INSERTION OF INTERBRONCHIAL VALVE (IBV) N/A 06/13/2018   Procedure: VIDEO BRONCHOSCOPY WITH REMOVAL OF INTERBRONCHIAL VALVE (IBV) x 2;  Surgeon: [name], MD;  Location: [facility];  Service: Thoracic;  Laterality: N/A;    REVIEW OF SYSTEMS:  Constitutional: negative Eyes: negative Ears, nose, mouth, throat, and face: negative Respiratory: negative Cardiovascular: negative Gastrointestinal: positive for diarrhea and dyspepsia Genitourinary:negative Integument/breast: positive for rash Hematologic/lymphatic: negative Musculoskeletal:negative Neurological: negative Behavioral/Psych: negative Endocrine: negative Allergic/Immunologic: negative   PHYSICAL EXAMINATION: General appearance: alert, cooperative and no distress Head: Normocephalic, without obvious abnormality, atraumatic Neck: no  adenopathy, no JVD, supple, symmetrical, trachea midline and thyroid not enlarged, symmetric, no tenderness/mass/nodules Lymph nodes: Cervical, supraclavicular, and axillary nodes normal. Resp: clear to auscultation bilaterally Back: symmetric, no curvature. ROM normal. No CVA tenderness. Cardio: regular rate and rhythm, S1, S2 normal, no murmur, click, rub or gallop GI: soft, non-tender; bowel sounds normal; no masses,  no organomegaly Extremities: extremities normal, atraumatic, no cyanosis or edema Neurologic: Alert and oriented X 3, normal strength and tone. Normal symmetric reflexes. Normal coordination and gait   ECOG PERFORMANCE STATUS: 1 - Symptomatic but completely ambulatory  Blood pressure 133/66, pulse 97, temperature 98.3 F (36.8 C), temperature source Tympanic, resp. rate 18, height 5' 4.75" (1.645 m), weight 144 lb 9.6 oz (65.6 kg), SpO2 97 %.   LABORATORY DATA: Lab Results  Component Value Date   WBC 5.9 11/26/2019   HGB 10.5 (L) 11/26/2019   HCT 33.0 (L) 11/26/2019   MCV 91.9 11/26/2019   PLT 214 11/26/2019      Chemistry      Component Value Date/Time   NA 136 11/26/2019 1147   K 4.1 11/26/2019 1147   CL 103 11/26/2019 1147   CO2 25 11/26/2019 1147   BUN 15 11/26/2019 1147   CREATININE 0.86 11/26/2019 1147      Component Value Date/Time   CALCIUM 9.4 11/26/2019 1147   ALKPHOS 55 11/26/2019 1147   AST 23 11/26/2019 1147   ALT 16 11/26/2019 1147  BILITOT 0.4 11/26/2019 1147       RADIOGRAPHIC STUDIES: CT Abdomen Pelvis Wo Contrast  Result Date: 11/26/2019 CLINICAL DATA:  Non-small-cell lung cancer.  Restaging. EXAM: CT CHEST, ABDOMEN AND PELVIS WITHOUT CONTRAST TECHNIQUE: Multidetector CT imaging of the chest, abdomen and pelvis was performed following the standard protocol without IV contrast. COMPARISON:  Chest CT 08/22/2019.  PET-CT 05/29/2019. FINDINGS: CT CHEST FINDINGS Cardiovascular: The heart size is normal. No substantial pericardial effusion.  Coronary artery calcification is evident. Atherosclerotic calcification is noted in the wall of the thoracic aorta. Mediastinum/Nodes: The mildly enlarged high right paratracheal node identified previously at 10 mm short axis is now 8 mm short axis (image 13/series 2) similar to CT from 05/21/2019. No mediastinal lymphadenopathy on today's study. No evidence for gross hilar lymphadenopathy although assessment is limited by the lack of intravenous contrast on today's study. Mild circumferential wall thickening noted distal esophagus (image 44/series 2). There is no hilar lymphadenopathy. Lungs/Pleura: Volume loss left hemithorax compatible with prior left lower lobectomy. Continued further decrease in the tiny inferomedial left pleural fluid collection noted. The 9 mm sub solid right lower lobe pulmonary nodule described as decreasing on the previous study has no residual measurable component on today's exam (image 113/4). No new suspicious pulmonary nodule or mass. The pleural lesions identified on prior studies show further continued decrease in conspicuity. Index left pleural lesion measured previously at 5 mm thickness is now 2 mm along the paraspinal pleura of image 30/series 2. Index 3 mm pleural-based nodule in the paraspinal left hemithorax on 19/2 measured 4-5 mm previously (remeasured). Nodular pleural thickening in the paraspinal left hemithorax on 23/2 measures 1.5 x 0.6 cm today. This lesion was approximately 2.2 x 0.9 cm previously (remeasured). Musculoskeletal: No worrisome lytic or sclerotic osseous abnormality. CT ABDOMEN PELVIS FINDINGS Hepatobiliary: No focal abnormality in the liver on this study without intravenous contrast. There is no evidence for gallstones, gallbladder wall thickening, or pericholecystic fluid. No intrahepatic or extrahepatic biliary dilation. Pancreas: No focal mass lesion. No dilatation of the main duct. No intraparenchymal cyst. No peripancreatic edema. Spleen: No  splenomegaly. No focal mass lesion. Adrenals/Urinary Tract: No adrenal nodule or mass. Unremarkable noncontrast appearance of the kidneys. No evidence for hydroureter. The urinary bladder appears normal for the degree of distention. Stomach/Bowel: Stomach is unremarkable. No gastric wall thickening. No evidence of outlet obstruction. Duodenum is normally positioned as is the ligament of Treitz. No small bowel wall thickening. No small bowel dilatation. The terminal ileum is normal. The appendix is normal. No gross colonic mass. No colonic wall thickening. A few scattered diverticuli are seen in the left colon without diverticulitis. Vascular/Lymphatic: There is abdominal aortic atherosclerosis without aneurysm. There is no gastrohepatic or hepatoduodenal ligament lymphadenopathy. No retroperitoneal or mesenteric lymphadenopathy. No pelvic sidewall lymphadenopathy. Reproductive: The uterus is unremarkable.  There is no adnexal mass. Other: No intraperitoneal free fluid. Musculoskeletal: No worrisome lytic or sclerotic osseous abnormality. IMPRESSION: 1. Continued further decrease in pleural based soft tissue nodules in the left hemithorax. No new or progressive findings on today's study. 2. Sub solid right lower lobe pulmonary nodule has essentially resolved in the interval with no residual measurable soft tissue at this location today. 3. The high right paratracheal lymph node described as mildly enlarged on the previous study has decreased in size in the interval now measuring 8 mm short axis, within normal limits. Continued attention on follow-up suggested. 4. Mild circumferential wall thickening distal esophagus. Esophagitis could have this appearance. Neoplasm  considered unlikely although there was some described FDG uptake in this region on the PET-CT of 05/29/2019, which could be related to inflammation. Upper endoscopy could be used to further evaluate as clinically warranted. 5. Aortic Atherosclerosis  (ICD10-I70.0). Electronically Signed   By: Misty Stanley M.D.   On: 11/26/2019 14:08   CT Chest Wo Contrast  Result Date: 11/26/2019 CLINICAL DATA:  Non-small-cell lung cancer.  Restaging. EXAM: CT CHEST, ABDOMEN AND PELVIS WITHOUT CONTRAST TECHNIQUE: Multidetector CT imaging of the chest, abdomen and pelvis was performed following the standard protocol without IV contrast. COMPARISON:  Chest CT 08/22/2019.  PET-CT 05/29/2019. FINDINGS: CT CHEST FINDINGS Cardiovascular: The heart size is normal. No substantial pericardial effusion. Coronary artery calcification is evident. Atherosclerotic calcification is noted in the wall of the thoracic aorta. Mediastinum/Nodes: The mildly enlarged high right paratracheal node identified previously at 10 mm short axis is now 8 mm short axis (image 13/series 2) similar to CT from 05/21/2019. No mediastinal lymphadenopathy on today's study. No evidence for gross hilar lymphadenopathy although assessment is limited by the lack of intravenous contrast on today's study. Mild circumferential wall thickening noted distal esophagus (image 44/series 2). There is no hilar lymphadenopathy. Lungs/Pleura: Volume loss left hemithorax compatible with prior left lower lobectomy. Continued further decrease in the tiny inferomedial left pleural fluid collection noted. The 9 mm sub solid right lower lobe pulmonary nodule described as decreasing on the previous study has no residual measurable component on today's exam (image 113/4). No new suspicious pulmonary nodule or mass. The pleural lesions identified on prior studies show further continued decrease in conspicuity. Index left pleural lesion measured previously at 5 mm thickness is now 2 mm along the paraspinal pleura of image 30/series 2. Index 3 mm pleural-based nodule in the paraspinal left hemithorax on 19/2 measured 4-5 mm previously (remeasured). Nodular pleural thickening in the paraspinal left hemithorax on 23/2 measures 1.5 x 0.6 cm  today. This lesion was approximately 2.2 x 0.9 cm previously (remeasured). Musculoskeletal: No worrisome lytic or sclerotic osseous abnormality. CT ABDOMEN PELVIS FINDINGS Hepatobiliary: No focal abnormality in the liver on this study without intravenous contrast. There is no evidence for gallstones, gallbladder wall thickening, or pericholecystic fluid. No intrahepatic or extrahepatic biliary dilation. Pancreas: No focal mass lesion. No dilatation of the main duct. No intraparenchymal cyst. No peripancreatic edema. Spleen: No splenomegaly. No focal mass lesion. Adrenals/Urinary Tract: No adrenal nodule or mass. Unremarkable noncontrast appearance of the kidneys. No evidence for hydroureter. The urinary bladder appears normal for the degree of distention. Stomach/Bowel: Stomach is unremarkable. No gastric wall thickening. No evidence of outlet obstruction. Duodenum is normally positioned as is the ligament of Treitz. No small bowel wall thickening. No small bowel dilatation. The terminal ileum is normal. The appendix is normal. No gross colonic mass. No colonic wall thickening. A few scattered diverticuli are seen in the left colon without diverticulitis. Vascular/Lymphatic: There is abdominal aortic atherosclerosis without aneurysm. There is no gastrohepatic or hepatoduodenal ligament lymphadenopathy. No retroperitoneal or mesenteric lymphadenopathy. No pelvic sidewall lymphadenopathy. Reproductive: The uterus is unremarkable.  There is no adnexal mass. Other: No intraperitoneal free fluid. Musculoskeletal: No worrisome lytic or sclerotic osseous abnormality. IMPRESSION: 1. Continued further decrease in pleural based soft tissue nodules in the left hemithorax. No new or progressive findings on today's study. 2. Sub solid right lower lobe pulmonary nodule has essentially resolved in the interval with no residual measurable soft tissue at this location today. 3. The high right paratracheal lymph node described  as  mildly enlarged on the previous study has decreased in size in the interval now measuring 8 mm short axis, within normal limits. Continued attention on follow-up suggested. 4. Mild circumferential wall thickening distal esophagus. Esophagitis could have this appearance. Neoplasm considered unlikely although there was some described FDG uptake in this region on the PET-CT of 05/29/2019, which could be related to inflammation. Upper endoscopy could be used to further evaluate as clinically warranted. 5. Aortic Atherosclerosis (ICD10-I70.0). Electronically Signed   By: Misty Stanley M.D.   On: 11/26/2019 14:08    ASSESSMENT AND PLAN: This is a very pleasant 69 years old white female with a stage IIa non-small cell lung cancer, adenocarcinoma with positive EGFR mutation in exon 21 (L858R) status post left lower lobectomy with lymph node dissection under the care of Dr. Roxan Hockey. The patient is currently on observation.  She declined to proceed with adjuvant systemic chemotherapy or targeted therapy. The patient has been in observation since her surgical resection. Recent CT scan of the chest followed by PET scan showed suspicious pleural-based mass and nodularity in the left lung concerning for disease recurrence.  Her disease recurrence was confirmed with repeat biopsy of the pleural-based left lung mass. We had molecular studies on the new biopsy and it was consistent with EGFR mutation exon 21 (L858R). The patient started treatment with Tagrisso 80 mg p.o. daily status post 5 months of treatment.  The patient has been tolerating this treatment well with no significant adverse effects. She had repeat CT scan of the chest, abdomen pelvis performed recently.  I personally and independently reviewed the scan images and discussed the result and showed the images to the patient and her husband. Her scan showed no concerning findings for disease progression and there was further improvement of her disease. I  recommended for the patient to continue her current treatment with Tagrisso with the same dose. The patient scared about receiving a booster dose of Covid vaccine if approved and I recommended for her to have it if FDA approved. She will come back for follow-up visit in 3 months for evaluation with repeat CT scan of the chest.  In the interval she will be seen by Dr. Tammi Klippel at [facility] in [place] for repeat blood work in around 6 weeks. The patient was advised to call immediately if she has any other concerning symptoms in the interval. The patient voices understanding of current disease status and treatment options and is in agreement with the current care plan. All questions were answered. The patient knows to call the clinic with any problems, questions or concerns. We can certainly see the patient much sooner if necessary. The total time spent in the appointment was 45 minutes.  Disclaimer: This note was dictated with voice recognition software. Similar sounding words can inadvertently be transcribed and may not be corrected upon review.

## 2019-11-27 ENCOUNTER — Telehealth: Payer: Self-pay | Admitting: Internal Medicine

## 2019-11-27 NOTE — Telephone Encounter (Signed)
Scheduled appt per 8/10 los - mailed reminder letter with appt date and time

## 2019-12-11 ENCOUNTER — Other Ambulatory Visit: Payer: Self-pay | Admitting: Physician Assistant

## 2019-12-11 DIAGNOSIS — C3492 Malignant neoplasm of unspecified part of left bronchus or lung: Secondary | ICD-10-CM

## 2019-12-16 ENCOUNTER — Encounter: Payer: Self-pay | Admitting: Internal Medicine

## 2019-12-16 MED FILL — TAGRISSO 80 MG TABLET: 80 | 30 days supply | Qty: 30 | Fill #0

## 2019-12-17 ENCOUNTER — Telehealth: Payer: Self-pay | Admitting: Medical Oncology

## 2019-12-17 ENCOUNTER — Encounter: Payer: Self-pay | Admitting: Medical Oncology

## 2019-12-17 NOTE — Telephone Encounter (Signed)
Canker sore treatment Per our pharmacy" I can't find anything that would be contraindicated OTC for canker sores - regarding the incidence of this being a SE of Tagrisso, there is a reported incidence of 32% for stomatitis (in persepective there's typically higher rates of issues w/ nail disease, skin rashes and diarrhea than the stomatitis, but definitely can be a side effect) "

## 2020-01-13 MED FILL — TAGRISSO 80 MG TABLET: 80 | 30 days supply | Qty: 30 | Fill #1

## 2020-01-15 ENCOUNTER — Telehealth: Payer: Self-pay | Admitting: Medical Oncology

## 2020-01-15 NOTE — Telephone Encounter (Signed)
Labs requested from pt PCP.  appts confirmed with pt.

## 2020-01-31 ENCOUNTER — Telehealth: Payer: Self-pay

## 2020-01-31 NOTE — Telephone Encounter (Signed)
Pt LM requesting her rx of Tragrisso as a 90 day supply as she will be traveling out of town.  Review of chart found that pt has a follow-up appt on 02/25/20 and plans to attend it despite her travels. Pt was advised to accept the 30day supply at this time and to advise at her next appt to have it written as a 90 day rx. Pt express understanding of this information and request I call the [facility] to advise to send the 30day supply.  I called the pharmacy and spoke with Tiffany and advised as indicated.

## 2020-02-05 MED FILL — TAGRISSO 80 MG TABLET: 80 | 30 days supply | Qty: 30 | Fill #2

## 2020-02-25 ENCOUNTER — Other Ambulatory Visit: Payer: Self-pay | Admitting: Physician Assistant

## 2020-02-25 ENCOUNTER — Other Ambulatory Visit: Payer: Self-pay | Admitting: Internal Medicine

## 2020-02-25 ENCOUNTER — Encounter: Payer: Self-pay | Admitting: Internal Medicine

## 2020-02-25 ENCOUNTER — Inpatient Hospital Stay (HOSPITAL_BASED_OUTPATIENT_CLINIC_OR_DEPARTMENT_OTHER): Payer: Medicare Other | Admitting: Internal Medicine

## 2020-02-25 ENCOUNTER — Inpatient Hospital Stay: Payer: Medicare Other | Attending: Internal Medicine

## 2020-02-25 ENCOUNTER — Ambulatory Visit (HOSPITAL_COMMUNITY)
Admission: RE | Admit: 2020-02-25 | Discharge: 2020-02-25 | Disposition: A | Discharge status: Home / Self Care | Payer: Medicare Other | Source: Ambulatory Visit | Attending: Internal Medicine | Admitting: Internal Medicine

## 2020-02-25 ENCOUNTER — Other Ambulatory Visit: Payer: Self-pay

## 2020-02-25 ENCOUNTER — Other Ambulatory Visit: Payer: Medicare Other

## 2020-02-25 VITALS — BP 159/62 | HR 77 | Temp 98.0°F | Resp 18 | Ht 64.75 in | Wt 142.6 lb

## 2020-02-25 DIAGNOSIS — C3490 Malignant neoplasm of unspecified part of unspecified bronchus or lung: Secondary | ICD-10-CM

## 2020-02-25 DIAGNOSIS — I10 Essential (primary) hypertension: Secondary | ICD-10-CM | POA: Insufficient documentation

## 2020-02-25 DIAGNOSIS — L270 Generalized skin eruption due to drugs and medicaments taken internally: Secondary | ICD-10-CM

## 2020-02-25 DIAGNOSIS — Z79899 Other long term (current) drug therapy: Secondary | ICD-10-CM | POA: Insufficient documentation

## 2020-02-25 DIAGNOSIS — C3432 Malignant neoplasm of lower lobe, left bronchus or lung: Secondary | ICD-10-CM | POA: Insufficient documentation

## 2020-02-25 DIAGNOSIS — Z902 Acquired absence of lung [part of]: Secondary | ICD-10-CM | POA: Insufficient documentation

## 2020-02-25 DIAGNOSIS — Z5111 Encounter for antineoplastic chemotherapy: Secondary | ICD-10-CM | POA: Diagnosis not present

## 2020-02-25 DIAGNOSIS — C3492 Malignant neoplasm of unspecified part of left bronchus or lung: Secondary | ICD-10-CM

## 2020-02-25 LAB — CBC WITH DIFFERENTIAL (CANCER CENTER ONLY)
Abs Immature Granulocytes: 0.01 10*3/uL (ref 0.00–0.07)
Basophils Absolute: 0 10*3/uL (ref 0.0–0.1)
Basophils Relative: 0 %
Eosinophils Absolute: 0.1 10*3/uL (ref 0.0–0.5)
Eosinophils Relative: 1 %
HCT: 32.5 % — ABNORMAL LOW (ref 36.0–46.0)
Hemoglobin: 10.5 g/dL — ABNORMAL LOW (ref 12.0–15.0)
Immature Granulocytes: 0 %
Lymphocytes Relative: 40 %
Lymphs Abs: 2.2 10*3/uL (ref 0.7–4.0)
MCH: 30.4 pg (ref 26.0–34.0)
MCHC: 32.3 g/dL (ref 30.0–36.0)
MCV: 94.2 fL (ref 80.0–100.0)
Monocytes Absolute: 0.6 10*3/uL (ref 0.1–1.0)
Monocytes Relative: 11 %
Neutro Abs: 2.6 10*3/uL (ref 1.7–7.7)
Neutrophils Relative %: 48 %
Platelet Count: 220 10*3/uL (ref 150–400)
RBC: 3.45 MIL/uL — ABNORMAL LOW (ref 3.87–5.11)
RDW: 13.5 % (ref 11.5–15.5)
WBC Count: 5.4 10*3/uL (ref 4.0–10.5)
nRBC: 0 % (ref 0.0–0.2)

## 2020-02-25 LAB — CMP (CANCER CENTER ONLY)
ALT: 13 U/L (ref 0–44)
AST: 20 U/L (ref 15–41)
Albumin: 4 g/dL (ref 3.5–5.0)
Alkaline Phosphatase: 51 U/L (ref 38–126)
Anion gap: 6 (ref 5–15)
BUN: 15 mg/dL (ref 8–23)
CO2: 28 mmol/L (ref 22–32)
Calcium: 9.2 mg/dL (ref 8.9–10.3)
Chloride: 104 mmol/L (ref 98–111)
Creatinine: 0.86 mg/dL (ref 0.44–1.00)
GFR, Estimated: >60 mL/min (ref >60)
Glucose, Bld: 95 mg/dL (ref 70–99)
Potassium: 4.4 mmol/L (ref 3.5–5.1)
Sodium: 138 mmol/L (ref 135–145)
Total Bilirubin: 0.4 mg/dL (ref 0.3–1.2)
Total Protein: 6.6 g/dL (ref 6.5–8.1)

## 2020-02-25 MED ORDER — OSIMERTINIB MESYLATE 80 MG PO TABS: ORAL_TABLET | ORAL | 1 refills | Status: DC

## 2020-02-25 NOTE — Progress Notes (Signed)
[place] [phone]   [phone]  OFFICE PROGRESS NOTE  Donald Prose, MD [place] [address]  DIAGNOSIS: Suspicious recurrent non-small cell lung cancer initially diagnosed as stage IIA (T2b, N0, M0) non-small cell lung cancer, adenocarcinoma diagnosed in November 2019  Molecular studies showed EGFR mutation in exon 21 (L858R) PD-L1 expression 0  PRIOR THERAPY: status post left lower lobectomy with lymph node dissection on April 25, 2018 under the care of Dr. Roxan Hockey.  She declined adjuvant therapy  CURRENT THERAPY: Osimertinib (Tagrisso) 80 mg p.o. daily.  First dose was on June 19, 2019.  Status post 8 months of treatment.  INTERVAL HISTORY: [name] 69 y.o. female returns to the clinic today for follow-up visit accompanied by her husband.  The patient is feeling fine today with no concerning complaints except for mild fatigue and nail soreness especially at the edges.  She denied having any current chest pain, shortness of breath, cough or hemoptysis.  She denied having any fever or chills.  She has no nausea, vomiting, diarrhea or constipation.  She denied having any headache or visual changes.  She has been tolerating her treatment with Tagrisso fairly well she had repeat CT scan of the chest performed earlier today and she is here for evaluation and discussion of her risk her results.   MEDICAL HISTORY: Past Medical History:  Diagnosis Date  . Anemia   . Atherosclerosis   . GERD (gastroesophageal reflux disease)   . Hemoptysis   . Hyperlipidemia   . Hypertension   . IBS (irritable bowel syndrome)   . Lung mass   . Migraines   . Spondylosis   . Vitamin D deficiency     ALLERGIES:  is allergic to avocado, influenza vaccines, lisinopril, metoprolol, nexium [esomeprazole magnesium], penicillins, pork-derived products, proton pump inhibitors, sulfites, wasp venom protein, zantac [ranitidine hcl],  cefaclor, chlorhexidine, chloroxine, ciprofloxacin hcl, clarithromycin, gatifloxacin, other, sulfa antibiotics, sulfamethoxazole-trimethoprim, eggs or egg-derived products, aspartame, chocolate, dilaudid [hydromorphone hcl], fentanyl, ivp dye [iodinated diagnostic agents], ketorolac, lactase, milk-related compounds, molds & smuts, oxycodone, prednisolone, shellfish allergy, and soap.  MEDICATIONS:  Current Outpatient Medications  Medication Sig Dispense Refill  . Carboxymethylcellul-Glycerin (LUBRICATING EYE DROPS OP) Place 1 drop into both eyes daily as needed (dry eyes).    . Cholecalciferol (VITAMIN D3) LIQD Take 4,000 Units by mouth daily.    . Cholecalciferol 50 MCG (2000 UT) TABS Take 4,000 Units by mouth daily.    . Coenzyme Q10 (COQ-10) 100 MG CAPS Take 200 mg by mouth daily.    Marland Kitchen diltiazem (TIAZAC) 180 MG 24 hr capsule Take 180 mg by mouth daily in the afternoon.     Marland Kitchen EPINEPHrine (EPIPEN 2-PAK) 0.3 mg/0.3 mL IJ SOAJ injection Inject 0.3 mg into the muscle as needed for anaphylaxis.     . Omega-3 Fatty Acids (FISH OIL PO) Take 2 capsules by mouth daily.    Marland Kitchen OVER THE COUNTER MEDICATION Triamcinolone, urea and menthol and camphor    . pravastatin (PRAVACHOL) 10 MG tablet Take 5 mg by mouth every other day. Takes 5 mg's every other day    . PRESCRIPTION MEDICATION Apply 1 application topically 2 (two) times daily as needed for itching. Dr Molinda Bailiff 2019    . rizatriptan (MAXALT) 10 MG tablet Take 5 mg by mouth as needed for migraine. May repeat in 2 hours if needed     . sucralfate (CARAFATE) 1 g tablet Take 1  g by mouth See admin instructions. Crush and drink 1 g by mouth 2-3 times daily    . TAGRISSO 80 MG tablet TAKE 1 TABLET (80 MG TOTAL) BY MOUTH DAILY. 30 tablet 2  . vitamin B-12 (CYANOCOBALAMIN) 1000 MCG tablet Take 1,000 mcg by mouth daily.      No current facility-administered medications for this visit.    SURGICAL HISTORY:  Past Surgical History:  Procedure Laterality Date   . BROW LIFT AND BLEPHAROPLASTY  03/15/2013  . CATARACT EXTRACTION, BILATERAL  06/23/2014  . CHEST TUBE INSERTION Left 05/10/2018   Procedure: CHEST TUBE INSERTION;  Surgeon: [name], MD;  Location: [facility];  Service: Thoracic;  Laterality: Left;  . COLONOSCOPY  05/2014  . ESOPHAGOGASTRODUODENOSCOPY  2019  . EYE SURGERY    . IR THORACENTESIS ASP PLEURAL SPACE W/IMG GUIDE  05/24/2018  . IR THORACENTESIS ASP PLEURAL SPACE W/IMG GUIDE  06/14/2018  . TONSILLECTOMY    . VIDEO ASSISTED THORACOSCOPY (VATS)/ LOBECTOMY Left 04/25/2018   Procedure: VIDEO ASSISTED THORACOSCOPY (VATS)LEFT LOWER LOBECTOMY, NODE DISSECTION;  Surgeon: [name], MD;  Location: [facility];  Service: Thoracic;  Laterality: Left;  Marland Kitchen VIDEO BRONCHOSCOPY WITH ENDOBRONCHIAL NAVIGATION N/A 03/07/2018   Procedure: VIDEO BRONCHOSCOPY WITH ENDOBRONCHIAL NAVIGATION;  Surgeon: Collene Gobble, MD;  Location: [facility];  Service: Thoracic;  Laterality: N/A;  . VIDEO BRONCHOSCOPY WITH INSERTION OF INTERBRONCHIAL VALVE (IBV) N/A 05/10/2018   Procedure: VIDEO BRONCHOSCOPY WITH INSERTION OF INTERBRONCHIAL VALVE (IBV);  Surgeon: [name], MD;  Location: [facility];  Service: Thoracic;  Laterality: N/A;  . VIDEO BRONCHOSCOPY WITH INSERTION OF INTERBRONCHIAL VALVE (IBV) N/A 06/13/2018   Procedure: VIDEO BRONCHOSCOPY WITH REMOVAL OF INTERBRONCHIAL VALVE (IBV) x 2;  Surgeon: [name], MD;  Location: [facility];  Service: Thoracic;  Laterality: N/A;    REVIEW OF SYSTEMS:  Constitutional: positive for fatigue Eyes: negative Ears, nose, mouth, throat, and face: negative Respiratory: negative Cardiovascular: negative Gastrointestinal: negative Genitourinary:negative Integument/breast: positive for dryness Hematologic/lymphatic: negative Musculoskeletal:negative Neurological: negative Behavioral/Psych: negative Endocrine: negative Allergic/Immunologic: negative   PHYSICAL EXAMINATION: General appearance: alert,  cooperative and no distress Head: Normocephalic, without obvious abnormality, atraumatic Neck: no adenopathy, no JVD, supple, symmetrical, trachea midline and thyroid not enlarged, symmetric, no tenderness/mass/nodules Lymph nodes: Cervical, supraclavicular, and axillary nodes normal. Resp: clear to auscultation bilaterally Back: symmetric, no curvature. ROM normal. No CVA tenderness. Cardio: regular rate and rhythm, S1, S2 normal, no murmur, click, rub or gallop GI: soft, non-tender; bowel sounds normal; no masses,  no organomegaly Extremities: extremities normal, atraumatic, no cyanosis or edema Neurologic: Alert and oriented X 3, normal strength and tone. Normal symmetric reflexes. Normal coordination and gait   ECOG PERFORMANCE STATUS: 1 - Symptomatic but completely ambulatory  Blood pressure (!) 159/62, pulse 77, temperature 98 F (36.7 C), temperature source Tympanic, resp. rate 18, height 5' 4.75" (1.645 m), weight 142 lb 9.6 oz (64.7 kg), SpO2 100 %.   LABORATORY DATA: Lab Results  Component Value Date   WBC 5.4 02/25/2020   HGB 10.5 (L) 02/25/2020   HCT 32.5 (L) 02/25/2020   MCV 94.2 02/25/2020   PLT 220 02/25/2020      Chemistry      Component Value Date/Time   NA 138 02/25/2020 1030   K 4.4 02/25/2020 1030   CL 104 02/25/2020 1030   CO2 28 02/25/2020 1030   BUN 15 02/25/2020 1030   CREATININE 0.86 02/25/2020 1030      Component Value Date/Time  CALCIUM 9.2 02/25/2020 1030   ALKPHOS 51 02/25/2020 1030   AST 20 02/25/2020 1030   ALT 13 02/25/2020 1030   BILITOT 0.4 02/25/2020 1030       RADIOGRAPHIC STUDIES: CT Chest Wo Contrast  Result Date: 02/25/2020 CLINICAL DATA:  Non-small-cell lung cancer.  Restaging. EXAM: CT CHEST WITHOUT CONTRAST TECHNIQUE: Multidetector CT imaging of the chest was performed following the standard protocol without IV contrast. COMPARISON:  11/26/2019 FINDINGS: Cardiovascular: The heart size is normal. No substantial pericardial  effusion. Coronary artery calcification is evident. Atherosclerotic calcification is noted in the wall of the thoracic aorta. Mediastinum/Nodes: Index high right paratracheal lymph node is stable at 8 mm short axis (image 33/series 3). No other mediastinal lymphadenopathy. No evidence for gross hilar lymphadenopathy although assessment is limited by the lack of intravenous contrast on today's study. The esophagus has normal imaging features. Stable scattered small bilateral axillary lymph nodes. Lungs/Pleura: Stable volume loss left hemithorax with postsurgical change compatible with prior left lower lobectomy. Chronic atelectasis or linear scarring in the right lower lobe is unchanged. No new suspicious pulmonary parenchymal nodule or mass. Pleural lesions are stable. Index left paraspinal pleural lesion measured previously at 2 mm thickness is stable at 2 mm thickness today when measuring at the same level (image 70/series 3). Previously measured index 3 mm pleural nodule in the paraspinal left hemithorax is no longer measurable. Nodular pleural thickening posterior to the transverse aorta measures 1.0 x 0.4 cm today (image 53/series 3). Compared to 1.5 x 0.6 cm previously. No focal airspace consolidation.  No pleural effusion. Upper Abdomen: Unremarkable. Musculoskeletal: No worrisome lytic or sclerotic osseous abnormality. IMPRESSION: 1. Exam is stable to improved in the interval. Index mediastinal node is stable. Index pleural disease measured previously in the medial left hemithorax is stable in some areas and decreased in others. No new or progressive findings on today's exam. 2.  Aortic Atherosclerois (ICD10-170.0) Electronically Signed   By: Misty Stanley M.D.   On: 02/25/2020 12:20    ASSESSMENT AND PLAN: This is a very pleasant 69 years old white female with a stage IIa non-small cell lung cancer, adenocarcinoma with positive EGFR mutation in exon 21 (L858R) status post left lower lobectomy with lymph  node dissection under the care of Dr. Roxan Hockey. The patient is currently on observation.  She declined to proceed with adjuvant systemic chemotherapy or targeted therapy. The patient has been in observation since her surgical resection. Recent CT scan of the chest followed by PET scan showed suspicious pleural-based mass and nodularity in the left lung concerning for disease recurrence.  Her disease recurrence was confirmed with repeat biopsy of the pleural-based left lung mass. We had molecular studies on the new biopsy and it was consistent with EGFR mutation exon 21 (L858R). The patient started treatment with Tagrisso 80 mg p.o. daily status post 8 months of treatment.  The patient has been tolerating her treatment with Tagrisso fairly well. She had repeat CT scan of the chest performed earlier today. I personally and independently reviewed the scans and discussed the results with the patient today. Her scan showed no concerning findings for disease progression.  That showed a stable to slight improvement of her disease. I recommended for the patient to continue her current treatment with Tagrisso with the same dose. The patient has plans to travel to [place] in around 6 weeks from now and I will arrange for her a follow-up visit after her return in the second week of January 2022.  The patient was advised to call immediately if she has any other concerning symptoms in the interval. The patient voices understanding of current disease status and treatment options and is in agreement with the current care plan. All questions were answered. The patient knows to call the clinic with any problems, questions or concerns. We can certainly see the patient much sooner if necessary. The total time spent in the appointment was 30 minutes.  Disclaimer: This note was dictated with voice recognition software. Similar sounding words can inadvertently be transcribed and may not be corrected upon  review.

## 2020-02-27 ENCOUNTER — Ambulatory Visit: Payer: Medicare Other | Admitting: Internal Medicine

## 2020-02-28 MED FILL — TAGRISSO 80 MG TABLET: 80 | 30 days supply | Qty: 30 | Fill #0

## 2020-03-24 MED FILL — TAGRISSO 80 MG TABLET: 80 | 30 days supply | Qty: 30 | Fill #1

## 2020-04-23 MED FILL — TAGRISSO 80 MG TABLET: 80 | 30 days supply | Qty: 30 | Fill #2

## 2020-04-28 ENCOUNTER — Inpatient Hospital Stay: Payer: Medicare Other | Attending: Internal Medicine

## 2020-04-28 ENCOUNTER — Other Ambulatory Visit: Payer: Self-pay

## 2020-04-28 ENCOUNTER — Inpatient Hospital Stay (HOSPITAL_BASED_OUTPATIENT_CLINIC_OR_DEPARTMENT_OTHER): Payer: Medicare Other | Admitting: Internal Medicine

## 2020-04-28 ENCOUNTER — Telehealth: Payer: Self-pay | Admitting: Internal Medicine

## 2020-04-28 VITALS — BP 146/60 | HR 82 | Temp 97.3°F | Resp 15 | Ht 64.75 in | Wt 135.0 lb

## 2020-04-28 DIAGNOSIS — Z902 Acquired absence of lung [part of]: Secondary | ICD-10-CM | POA: Insufficient documentation

## 2020-04-28 DIAGNOSIS — C3490 Malignant neoplasm of unspecified part of unspecified bronchus or lung: Secondary | ICD-10-CM

## 2020-04-28 DIAGNOSIS — Z5111 Encounter for antineoplastic chemotherapy: Secondary | ICD-10-CM

## 2020-04-28 DIAGNOSIS — Z79899 Other long term (current) drug therapy: Secondary | ICD-10-CM | POA: Insufficient documentation

## 2020-04-28 DIAGNOSIS — I10 Essential (primary) hypertension: Secondary | ICD-10-CM | POA: Insufficient documentation

## 2020-04-28 DIAGNOSIS — C3432 Malignant neoplasm of lower lobe, left bronchus or lung: Secondary | ICD-10-CM | POA: Diagnosis present

## 2020-04-28 DIAGNOSIS — C3492 Malignant neoplasm of unspecified part of left bronchus or lung: Secondary | ICD-10-CM

## 2020-04-28 LAB — CMP (CANCER CENTER ONLY)
ALT: 13 U/L (ref 0–44)
AST: 18 U/L (ref 15–41)
Albumin: 3.8 g/dL (ref 3.5–5.0)
Alkaline Phosphatase: 58 U/L (ref 38–126)
Anion gap: 8 (ref 5–15)
BUN: 13 mg/dL (ref 8–23)
CO2: 26 mmol/L (ref 22–32)
Calcium: 9.3 mg/dL (ref 8.9–10.3)
Chloride: 102 mmol/L (ref 98–111)
Creatinine: 0.97 mg/dL (ref 0.44–1.00)
GFR, Estimated: >60 mL/min (ref >60)
Glucose, Bld: 99 mg/dL (ref 70–99)
Potassium: 4.3 mmol/L (ref 3.5–5.1)
Sodium: 136 mmol/L (ref 135–145)
Total Bilirubin: 0.4 mg/dL (ref 0.3–1.2)
Total Protein: 6.8 g/dL (ref 6.5–8.1)

## 2020-04-28 LAB — CBC WITH DIFFERENTIAL (CANCER CENTER ONLY)
Abs Immature Granulocytes: 0.02 10*3/uL (ref 0.00–0.07)
Basophils Absolute: 0 10*3/uL (ref 0.0–0.1)
Basophils Relative: 0 %
Eosinophils Absolute: 0.1 10*3/uL (ref 0.0–0.5)
Eosinophils Relative: 1 %
HCT: 31.5 % — ABNORMAL LOW (ref 36.0–46.0)
Hemoglobin: 10.1 g/dL — ABNORMAL LOW (ref 12.0–15.0)
Immature Granulocytes: 0 %
Lymphocytes Relative: 27 %
Lymphs Abs: 1.9 10*3/uL (ref 0.7–4.0)
MCH: 30.9 pg (ref 26.0–34.0)
MCHC: 32.1 g/dL (ref 30.0–36.0)
MCV: 96.3 fL (ref 80.0–100.0)
Monocytes Absolute: 0.6 10*3/uL (ref 0.1–1.0)
Monocytes Relative: 9 %
Neutro Abs: 4.3 10*3/uL (ref 1.7–7.7)
Neutrophils Relative %: 63 %
Platelet Count: 276 10*3/uL (ref 150–400)
RBC: 3.27 MIL/uL — ABNORMAL LOW (ref 3.87–5.11)
RDW: 13.1 % (ref 11.5–15.5)
WBC Count: 6.9 10*3/uL (ref 4.0–10.5)
nRBC: 0 % (ref 0.0–0.2)

## 2020-04-28 NOTE — Progress Notes (Signed)
[place] [phone]   [phone]  OFFICE PROGRESS NOTE  Donald Prose, MD [place] [address]  DIAGNOSIS: Suspicious recurrent non-small cell lung cancer initially diagnosed as stage IIA (T2b, N0, M0) non-small cell lung cancer, adenocarcinoma diagnosed in November 2019  Molecular studies showed EGFR mutation in exon 21 (L858R) PD-L1 expression 0  PRIOR THERAPY: status post left lower lobectomy with lymph node dissection on April 25, 2018 under the care of Dr. Roxan Hockey.  She declined adjuvant therapy  CURRENT THERAPY: Osimertinib (Tagrisso) 80 mg p.o. daily.  First dose was on June 19, 2019.  Status post 9 months of treatment.  INTERVAL HISTORY: [name] 70 y.o. female returns to the clinic today for follow-up visit accompanied by her husband.  The patient came back from a trip to [place].  The patient met a lot of family member during her trip and one of them was tested positive for COVID recently.  The patient was tested twice and she has negative COVID test.  She continues to have soreness in her throat as well as nasal congestion and mild cough with occasional blood-tinged sputum.  She denied having any current chest pain, shortness of breath or hemoptysis.  She denied having any fever or chills.  She has no nausea, vomiting, diarrhea but has occasional constipation.  She denied having any headache or visual changes.  She had ingrown eyelashes that were removed by her ophthalmologist recently.  She also has mild conjunctivitis of the left eye.  She continues to tolerate her treatment with Tagrisso fairly well.   MEDICAL HISTORY: Past Medical History:  Diagnosis Date  . Anemia   . Atherosclerosis   . GERD (gastroesophageal reflux disease)   . Hemoptysis   . Hyperlipidemia   . Hypertension   . IBS (irritable bowel syndrome)   . Lung mass   . Migraines   . Spondylosis   . Vitamin D deficiency      ALLERGIES:  is allergic to avocado, influenza vaccines, lisinopril, metoprolol, nexium [esomeprazole magnesium], penicillins, pork-derived products, proton pump inhibitors, sulfites, wasp venom protein, zantac [ranitidine hcl], cefaclor, chlorhexidine, chloroxine, ciprofloxacin hcl, clarithromycin, gatifloxacin, other, sulfa antibiotics, sulfamethoxazole-trimethoprim, eggs or egg-derived products, aspartame, chocolate, dilaudid [hydromorphone hcl], fentanyl, ivp dye [iodinated diagnostic agents], ketorolac, lactase, milk-related compounds, molds & smuts, oxycodone, prednisolone, shellfish allergy, and soap.  MEDICATIONS:  Current Outpatient Medications  Medication Sig Dispense Refill  . Carboxymethylcellul-Glycerin (LUBRICATING EYE DROPS OP) Place 1 drop into both eyes daily as needed (dry eyes).    . Cholecalciferol (VITAMIN D3) LIQD Take 4,000 Units by mouth daily.    . Cholecalciferol 50 MCG (2000 UT) TABS Take 4,000 Units by mouth daily.    . Coenzyme Q10 (COQ-10) 100 MG CAPS Take 200 mg by mouth daily.    Marland Kitchen diltiazem (TIAZAC) 180 MG 24 hr capsule Take 180 mg by mouth daily in the afternoon.     Marland Kitchen EPINEPHrine (EPIPEN 2-PAK) 0.3 mg/0.3 mL IJ SOAJ injection Inject 0.3 mg into the muscle as needed for anaphylaxis.     . Omega-3 Fatty Acids (FISH OIL PO) Take 2 capsules by mouth daily.    Marland Kitchen osimertinib mesylate (TAGRISSO) 80 MG tablet TAKE 1 TABLET (80 MG TOTAL) BY MOUTH DAILY. 90 tablet 1  . OVER THE COUNTER MEDICATION Triamcinolone, urea and menthol and camphor    . pravastatin (PRAVACHOL) 10 MG tablet Take 5 mg by mouth every other day. Takes 5  mg's every other day    . PRESCRIPTION MEDICATION Apply 1 application topically 2 (two) times daily as needed for itching. Dr Molinda Bailiff 2019    . rizatriptan (MAXALT) 10 MG tablet Take 5 mg by mouth as needed for migraine. May repeat in 2 hours if needed     . sucralfate (CARAFATE) 1 g tablet Take 1 g by mouth See admin instructions. Crush and drink 1  g by mouth 2-3 times daily    . vitamin B-12 (CYANOCOBALAMIN) 1000 MCG tablet Take 1,000 mcg by mouth daily.      No current facility-administered medications for this visit.    SURGICAL HISTORY:  Past Surgical History:  Procedure Laterality Date  . BROW LIFT AND BLEPHAROPLASTY  03/15/2013  . CATARACT EXTRACTION, BILATERAL  06/23/2014  . CHEST TUBE INSERTION Left 05/10/2018   Procedure: CHEST TUBE INSERTION;  Surgeon: [name], MD;  Location: [facility];  Service: Thoracic;  Laterality: Left;  . COLONOSCOPY  05/2014  . ESOPHAGOGASTRODUODENOSCOPY  2019  . EYE SURGERY    . IR THORACENTESIS ASP PLEURAL SPACE W/IMG GUIDE  05/24/2018  . IR THORACENTESIS ASP PLEURAL SPACE W/IMG GUIDE  06/14/2018  . TONSILLECTOMY    . VIDEO ASSISTED THORACOSCOPY (VATS)/ LOBECTOMY Left 04/25/2018   Procedure: VIDEO ASSISTED THORACOSCOPY (VATS)LEFT LOWER LOBECTOMY, NODE DISSECTION;  Surgeon: [name], MD;  Location: [facility];  Service: Thoracic;  Laterality: Left;  Marland Kitchen VIDEO BRONCHOSCOPY WITH ENDOBRONCHIAL NAVIGATION N/A 03/07/2018   Procedure: VIDEO BRONCHOSCOPY WITH ENDOBRONCHIAL NAVIGATION;  Surgeon: Collene Gobble, MD;  Location: [facility];  Service: Thoracic;  Laterality: N/A;  . VIDEO BRONCHOSCOPY WITH INSERTION OF INTERBRONCHIAL VALVE (IBV) N/A 05/10/2018   Procedure: VIDEO BRONCHOSCOPY WITH INSERTION OF INTERBRONCHIAL VALVE (IBV);  Surgeon: [name], MD;  Location: [facility] OR;  Service: Thoracic;  Laterality: N/A;  . VIDEO BRONCHOSCOPY WITH INSERTION OF INTERBRONCHIAL VALVE (IBV) N/A 06/13/2018   Procedure: VIDEO BRONCHOSCOPY WITH REMOVAL OF INTERBRONCHIAL VALVE (IBV) x 2;  Surgeon: [name], MD;  Location: [facility];  Service: Thoracic;  Laterality: N/A;    REVIEW OF SYSTEMS:  Constitutional: positive for fatigue Eyes: negative Ears, nose, mouth, throat, and face: positive for nasal congestion and sore throat Respiratory: positive for cough Cardiovascular:  negative Gastrointestinal: negative Genitourinary:negative Integument/breast: positive for dryness Hematologic/lymphatic: negative Musculoskeletal:negative Neurological: negative Behavioral/Psych: negative Endocrine: negative Allergic/Immunologic: negative   PHYSICAL EXAMINATION: General appearance: alert, cooperative, fatigued and no distress Head: Normocephalic, without obvious abnormality, atraumatic Neck: no adenopathy, no JVD, supple, symmetrical, trachea midline and thyroid not enlarged, symmetric, no tenderness/mass/nodules Lymph nodes: Cervical, supraclavicular, and axillary nodes normal. Resp: clear to auscultation bilaterally Back: symmetric, no curvature. ROM normal. No CVA tenderness. Cardio: regular rate and rhythm, S1, S2 normal, no murmur, click, rub or gallop GI: soft, non-tender; bowel sounds normal; no masses,  no organomegaly Extremities: extremities normal, atraumatic, no cyanosis or edema Neurologic: Alert and oriented X 3, normal strength and tone. Normal symmetric reflexes. Normal coordination and gait   ECOG PERFORMANCE STATUS: 1 - Symptomatic but completely ambulatory  Blood pressure (!) 146/60, pulse 82, temperature (!) 97.3 F (36.3 C), temperature source Tympanic, resp. rate 15, height 5' 4.75" (1.645 m), weight 135 lb (61.2 kg), SpO2 100 %.   LABORATORY DATA: Lab Results  Component Value Date   WBC 6.9 04/28/2020   HGB 10.1 (L) 04/28/2020   HCT 31.5 (L) 04/28/2020   MCV 96.3 04/28/2020   PLT 276 04/28/2020      Chemistry  Component Value Date/Time   NA 138 02/25/2020 1030   K 4.4 02/25/2020 1030   CL 104 02/25/2020 1030   CO2 28 02/25/2020 1030   BUN 15 02/25/2020 1030   CREATININE 0.86 02/25/2020 1030      Component Value Date/Time   CALCIUM 9.2 02/25/2020 1030   ALKPHOS 51 02/25/2020 1030   AST 20 02/25/2020 1030   ALT 13 02/25/2020 1030   BILITOT 0.4 02/25/2020 1030       RADIOGRAPHIC STUDIES: No results  found.  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 Roxan Hockey. The patient is currently on observation.  She declined to proceed with adjuvant systemic chemotherapy or targeted therapy. The patient has been in observation since her surgical resection. Recent CT scan of the chest followed by PET scan showed suspicious pleural-based mass and nodularity in the left lung concerning for disease recurrence.  Her disease recurrence was confirmed with repeat biopsy of the pleural-based left lung mass. We had molecular studies on the new biopsy and it was consistent with EGFR mutation exon 21 (L858R). The patient started treatment with Tagrisso 80 mg p.o. daily status post 9 months of treatment.  The patient continues to tolerate her treatment well with no concerning adverse effect except for the dry skin and fatigue as well as the ingrown eyelashes that were removed. She has nasal congestion and this is likely secondary to a viral infection but she was tested negative for COVID twice. I recommended for the patient to continue her current treatment with Tagrisso with the same dose. I will see her back for follow-up visit in around 2 months with repeat CT scan of the chest for restaging of her disease. For the anemia, I recommended for her to start taking oral iron tablet at regular basis. The patient was advised to call immediately if she has any concerning symptoms in the interval. The patient voices understanding of current disease status and treatment options and is in agreement with the current care plan. All questions were answered. The patient knows to call the clinic with any problems, questions or concerns. We can certainly see the patient much sooner if necessary. The total time spent  in the appointment was 35 minutes.  Disclaimer: This note was dictated with voice recognition software. Similar sounding words can inadvertently be transcribed and may not be corrected upon review.

## 2020-04-28 NOTE — Telephone Encounter (Signed)
Scheduled appointments per 1/11 los. Spoke to patient who is aware of appointment date and time.

## 2020-04-29 ENCOUNTER — Encounter: Payer: Self-pay | Admitting: Internal Medicine

## 2020-05-19 MED FILL — TAGRISSO 80 MG TABLET: 80 | 30 days supply | Qty: 30 | Fill #3

## 2020-06-02 ENCOUNTER — Encounter: Payer: Medicare Other | Admitting: Thoracic Surgery (Cardiothoracic Vascular Surgery)

## 2020-06-22 MED FILL — TAGRISSO 80 MG TABLET: 80 | 30 days supply | Qty: 30 | Fill #4

## 2020-06-23 ENCOUNTER — Telehealth: Payer: Self-pay | Admitting: Internal Medicine

## 2020-06-23 ENCOUNTER — Inpatient Hospital Stay (HOSPITAL_BASED_OUTPATIENT_CLINIC_OR_DEPARTMENT_OTHER): Payer: Medicare Other | Admitting: Internal Medicine

## 2020-06-23 ENCOUNTER — Other Ambulatory Visit: Payer: Self-pay

## 2020-06-23 ENCOUNTER — Ambulatory Visit (HOSPITAL_COMMUNITY)
Admission: RE | Admit: 2020-06-23 | Discharge: 2020-06-23 | Disposition: A | Discharge status: Home / Self Care | Payer: Medicare Other | Source: Ambulatory Visit | Attending: Internal Medicine | Admitting: Internal Medicine

## 2020-06-23 ENCOUNTER — Ambulatory Visit (INDEPENDENT_AMBULATORY_CARE_PROVIDER_SITE_OTHER): Payer: Medicare Other | Admitting: Thoracic Surgery (Cardiothoracic Vascular Surgery)

## 2020-06-23 ENCOUNTER — Inpatient Hospital Stay: Payer: Medicare Other | Attending: Internal Medicine

## 2020-06-23 ENCOUNTER — Encounter: Payer: Self-pay | Admitting: Thoracic Surgery (Cardiothoracic Vascular Surgery)

## 2020-06-23 VITALS — BP 150/80 | HR 76 | Resp 20 | Ht 64.0 in | Wt 138.0 lb

## 2020-06-23 VITALS — BP 158/62 | HR 83 | Temp 97.4°F | Resp 13 | Ht 64.75 in | Wt 138.2 lb

## 2020-06-23 DIAGNOSIS — C3492 Malignant neoplasm of unspecified part of left bronchus or lung: Secondary | ICD-10-CM

## 2020-06-23 DIAGNOSIS — C3490 Malignant neoplasm of unspecified part of unspecified bronchus or lung: Secondary | ICD-10-CM | POA: Diagnosis not present

## 2020-06-23 DIAGNOSIS — Z5111 Encounter for antineoplastic chemotherapy: Secondary | ICD-10-CM

## 2020-06-23 DIAGNOSIS — Z902 Acquired absence of lung [part of]: Secondary | ICD-10-CM

## 2020-06-23 DIAGNOSIS — C3432 Malignant neoplasm of lower lobe, left bronchus or lung: Secondary | ICD-10-CM

## 2020-06-23 LAB — CBC WITH DIFFERENTIAL (CANCER CENTER ONLY)
Abs Immature Granulocytes: 0.02 10*3/uL (ref 0.00–0.07)
Basophils Absolute: 0 10*3/uL (ref 0.0–0.1)
Basophils Relative: 0 %
Eosinophils Absolute: 0.1 10*3/uL (ref 0.0–0.5)
Eosinophils Relative: 1 %
HCT: 30.8 % — ABNORMAL LOW (ref 36.0–46.0)
Hemoglobin: 9.9 g/dL — ABNORMAL LOW (ref 12.0–15.0)
Immature Granulocytes: 0 %
Lymphocytes Relative: 34 %
Lymphs Abs: 2 10*3/uL (ref 0.7–4.0)
MCH: 31.3 pg (ref 26.0–34.0)
MCHC: 32.1 g/dL (ref 30.0–36.0)
MCV: 97.5 fL (ref 80.0–100.0)
Monocytes Absolute: 0.5 10*3/uL (ref 0.1–1.0)
Monocytes Relative: 9 %
Neutro Abs: 3.2 10*3/uL (ref 1.7–7.7)
Neutrophils Relative %: 56 %
Platelet Count: 214 10*3/uL (ref 150–400)
RBC: 3.16 MIL/uL — ABNORMAL LOW (ref 3.87–5.11)
RDW: 13 % (ref 11.5–15.5)
WBC Count: 5.8 10*3/uL (ref 4.0–10.5)
nRBC: 0 % (ref 0.0–0.2)

## 2020-06-23 LAB — CMP (CANCER CENTER ONLY)
ALT: 17 U/L (ref 0–44)
AST: 22 U/L (ref 15–41)
Albumin: 4 g/dL (ref 3.5–5.0)
Alkaline Phosphatase: 51 U/L (ref 38–126)
Anion gap: 8 (ref 5–15)
BUN: 15 mg/dL (ref 8–23)
CO2: 28 mmol/L (ref 22–32)
Calcium: 9.1 mg/dL (ref 8.9–10.3)
Chloride: 102 mmol/L (ref 98–111)
Creatinine: 0.79 mg/dL (ref 0.44–1.00)
GFR, Estimated: >60 mL/min (ref >60)
Glucose, Bld: 101 mg/dL — ABNORMAL HIGH (ref 70–99)
Potassium: 3.9 mmol/L (ref 3.5–5.1)
Sodium: 138 mmol/L (ref 135–145)
Total Bilirubin: 0.4 mg/dL (ref 0.3–1.2)
Total Protein: 6.7 g/dL (ref 6.5–8.1)

## 2020-06-23 NOTE — Progress Notes (Signed)
[facility] [phone]   [phone]  OFFICE PROGRESS NOTE  Donald Prose, MD [place] [address]  DIAGNOSIS: Suspicious recurrent non-small cell lung cancer initially diagnosed as stage IIA (T2b, N0, M0) non-small cell lung cancer, adenocarcinoma diagnosed in November 2019  Molecular studies showed EGFR mutation in exon 21 (L858R) PD-L1 expression 0  PRIOR THERAPY: status post left lower lobectomy with lymph node dissection on April 25, 2018 under the care of Dr. Roxan Hockey.  She declined adjuvant therapy  CURRENT THERAPY: Osimertinib (Tagrisso) 80 mg p.o. daily.  First dose was on June 19, 2019.  Status post 12 months of treatment.  INTERVAL HISTORY: [name] 70 y.o. female returns to the clinic today for follow-up visit accompanied by her husband.  The patient is feeling fine today with no concerning complaints except for mild fatigue.  She started taking oral liquid iron but at a very low dose as part of a blood builder syrup.  She denied having any chest pain, shortness of breath, cough or hemoptysis.  She denied having any fever or chills.  She has no nausea, vomiting, diarrhea or constipation.  She has no headache or visual changes.  The patient continues to tolerate her treatment with Tagrisso fairly well.  She had repeat CT scan of the chest, abdomen and pelvis performed earlier today and she is here for evaluation and discussion of her scan results.   MEDICAL HISTORY: Past Medical History:  Diagnosis Date  . Anemia   . Atherosclerosis   . GERD (gastroesophageal reflux disease)   . Hemoptysis   . Hyperlipidemia   . Hypertension   . IBS (irritable bowel syndrome)   . Lung mass   . Migraines   . Spondylosis   . Vitamin D deficiency     ALLERGIES:  is allergic to avocado, influenza vaccines, lisinopril, metoprolol, nexium [esomeprazole magnesium], penicillins, pork-derived products, proton pump  inhibitors, sulfites, wasp venom protein, zantac [ranitidine hcl], cefaclor, chlorhexidine, chloroxine, ciprofloxacin hcl, clarithromycin, gatifloxacin, other, sulfa antibiotics, sulfamethoxazole-trimethoprim, eggs or egg-derived products, aspartame, chocolate, dilaudid [hydromorphone hcl], fentanyl, ivp dye [iodinated diagnostic agents], ketorolac, lactase, milk-related compounds, molds & smuts, oxycodone, prednisolone, shellfish allergy, and soap.  MEDICATIONS:  Current Outpatient Medications  Medication Sig Dispense Refill  . Carboxymethylcellul-Glycerin (LUBRICATING EYE DROPS OP) Place 1 drop into both eyes daily as needed (dry eyes).    . Cholecalciferol (VITAMIN D3) LIQD Take 4,000 Units by mouth daily.    . Cholecalciferol 50 MCG (2000 UT) TABS Take 4,000 Units by mouth daily.    Marland Kitchen diltiazem (TIAZAC) 180 MG 24 hr capsule Take 180 mg by mouth daily in the afternoon.     Marland Kitchen EPINEPHrine 0.3 mg/0.3 mL IJ SOAJ injection Inject 0.3 mg into the muscle as needed for anaphylaxis.     . Omega-3 Fatty Acids (FISH OIL PO) Take 2 capsules by mouth daily.    Marland Kitchen osimertinib mesylate (TAGRISSO) 80 MG tablet TAKE 1 TABLET (80 MG TOTAL) BY MOUTH DAILY. 90 tablet 1  . OVER THE COUNTER MEDICATION Triamcinolone, urea and menthol and camphor    . PRESCRIPTION MEDICATION Apply 1 application topically 2 (two) times daily as needed for itching. Dr Molinda Bailiff 2019    . rizatriptan (MAXALT) 10 MG tablet Take 5 mg by mouth as needed for migraine. May repeat in 2 hours if needed    . vitamin B-12 (CYANOCOBALAMIN) 250 MCG tablet Take 250 mcg by mouth daily.  No current facility-administered medications for this visit.    SURGICAL HISTORY:  Past Surgical History:  Procedure Laterality Date  . BROW LIFT AND BLEPHAROPLASTY  03/15/2013  . CATARACT EXTRACTION, BILATERAL  06/23/2014  . CHEST TUBE INSERTION Left 05/10/2018   Procedure: CHEST TUBE INSERTION;  Surgeon: [name], MD;  Location: [facility];  Service:  Thoracic;  Laterality: Left;  . COLONOSCOPY  05/2014  . ESOPHAGOGASTRODUODENOSCOPY  2019  . EYE SURGERY    . IR THORACENTESIS ASP PLEURAL SPACE W/IMG GUIDE  05/24/2018  . IR THORACENTESIS ASP PLEURAL SPACE W/IMG GUIDE  06/14/2018  . TONSILLECTOMY    . VIDEO ASSISTED THORACOSCOPY (VATS)/ LOBECTOMY Left 04/25/2018   Procedure: VIDEO ASSISTED THORACOSCOPY (VATS)LEFT LOWER LOBECTOMY, NODE DISSECTION;  Surgeon: [name], MD;  Location: [facility];  Service: Thoracic;  Laterality: Left;  Marland Kitchen VIDEO BRONCHOSCOPY WITH ENDOBRONCHIAL NAVIGATION N/A 03/07/2018   Procedure: VIDEO BRONCHOSCOPY WITH ENDOBRONCHIAL NAVIGATION;  Surgeon: Collene Gobble, MD;  Location: [facility];  Service: Thoracic;  Laterality: N/A;  . VIDEO BRONCHOSCOPY WITH INSERTION OF INTERBRONCHIAL VALVE (IBV) N/A 05/10/2018   Procedure: VIDEO BRONCHOSCOPY WITH INSERTION OF INTERBRONCHIAL VALVE (IBV);  Surgeon: [name], MD;  Location: [facility] OR;  Service: Thoracic;  Laterality: N/A;  . VIDEO BRONCHOSCOPY WITH INSERTION OF INTERBRONCHIAL VALVE (IBV) N/A 06/13/2018   Procedure: VIDEO BRONCHOSCOPY WITH REMOVAL OF INTERBRONCHIAL VALVE (IBV) x 2;  Surgeon: [name], MD;  Location: [facility];  Service: Thoracic;  Laterality: N/A;    REVIEW OF SYSTEMS:  Constitutional: positive for fatigue Eyes: negative Ears, nose, mouth, throat, and face: negative Respiratory: negative Cardiovascular: negative Gastrointestinal: negative Genitourinary:negative Integument/breast: negative Hematologic/lymphatic: negative Musculoskeletal:negative Neurological: negative Behavioral/Psych: negative Endocrine: negative Allergic/Immunologic: negative   PHYSICAL EXAMINATION: General appearance: alert, cooperative, fatigued and no distress Head: Normocephalic, without obvious abnormality, atraumatic Neck: no adenopathy, no JVD, supple, symmetrical, trachea midline and thyroid not enlarged, symmetric, no tenderness/mass/nodules Lymph nodes:  Cervical, supraclavicular, and axillary nodes normal. Resp: clear to auscultation bilaterally Back: symmetric, no curvature. ROM normal. No CVA tenderness. Cardio: regular rate and rhythm, S1, S2 normal, no murmur, click, rub or gallop GI: soft, non-tender; bowel sounds normal; no masses,  no organomegaly Extremities: extremities normal, atraumatic, no cyanosis or edema Neurologic: Alert and oriented X 3, normal strength and tone. Normal symmetric reflexes. Normal coordination and gait   ECOG PERFORMANCE STATUS: 0 - Asymptomatic  Blood pressure (!) 158/62, pulse 83, temperature (!) 97.4 F (36.3 C), temperature source Tympanic, resp. rate 13, height 5' 4.75" (1.645 m), weight 138 lb 3.2 oz (62.7 kg), SpO2 100 %.   LABORATORY DATA: Lab Results  Component Value Date   WBC 5.8 06/23/2020   HGB 9.9 (L) 06/23/2020   HCT 30.8 (L) 06/23/2020   MCV 97.5 06/23/2020   PLT 214 06/23/2020      Chemistry      Component Value Date/Time   NA 138 06/23/2020 1035   K 3.9 06/23/2020 1035   CL 102 06/23/2020 1035   CO2 28 06/23/2020 1035   BUN 15 06/23/2020 1035   CREATININE 0.79 06/23/2020 1035      Component Value Date/Time   CALCIUM 9.1 06/23/2020 1035   ALKPHOS 51 06/23/2020 1035   AST 22 06/23/2020 1035   ALT 17 06/23/2020 1035   BILITOT 0.4 06/23/2020 1035       RADIOGRAPHIC STUDIES: CT Abdomen Pelvis Wo Contrast  Result Date: 06/23/2020 CLINICAL DATA:  Non-small-cell lung cancer.  Restaging. EXAM: CT CHEST, ABDOMEN  AND PELVIS WITHOUT CONTRAST TECHNIQUE: Multidetector CT imaging of the chest, abdomen and pelvis was performed following the standard protocol without IV contrast. COMPARISON:  Chest CT 02/25/2020. Chest abdomen pelvis CT 11/26/2019. FINDINGS: CT CHEST FINDINGS Cardiovascular: The heart size is normal. No substantial pericardial effusion. Coronary artery calcification is evident. Atherosclerotic calcification is noted in the wall of the thoracic aorta. Mediastinum/Nodes:  Index high right paratracheal node is stable at 8 mm short axis (image 12/series 2). No new mediastinal lymphadenopathy. No evidence for gross hilar lymphadenopathy although assessment is limited by the lack of intravenous contrast on today's study. Similar mild circumferential wall thickness of the distal esophagus. There is no axillary lymphadenopathy. Lungs/Pleura: Volume loss left hemithorax compatible with prior left lower lobectomy. Stable appearance of chronic atelectasis or scarring in the right lower lobe. No new suspicious pulmonary nodule or mass. No focal airspace consolidation. There is no evidence of pleural effusion. Index left paraspinal pleural lesion measured previously at 2 mm is almost not measurable today, at about 1-2 mm (image 27/series 2). More inferior paraspinal left pleural nodule measured previously at 1.9 x 0.5 cm is now 1.6 x 0.4 cm (43/2). Index nodular pleural thickening posterior to the transverse aorta measured previously at 1.0 x 0.4 cm is now 0.9 x 0.3 cm (21/2). Musculoskeletal: No worrisome lytic or sclerotic osseous abnormality. CT ABDOMEN PELVIS FINDINGS Hepatobiliary: No focal abnormality in the liver on this study without intravenous contrast. There is no evidence for gallstones, gallbladder wall thickening, or pericholecystic fluid. No intrahepatic or extrahepatic biliary dilation. Pancreas: No focal mass lesion. No dilatation of the main duct. No intraparenchymal cyst. No peripancreatic edema. Spleen: No splenomegaly. No focal mass lesion. Adrenals/Urinary Tract: No adrenal nodule or mass. Unremarkable noncontrast appearance of the kidneys. No evidence for hydroureter. The urinary bladder appears normal for the degree of distention. Stomach/Bowel: Tiny hiatal hernia. Stomach otherwise unremarkable. Duodenum is normally positioned as is the ligament of Treitz. No small bowel wall thickening. No small bowel dilatation. The terminal ileum is normal. The appendix is normal. No  gross colonic mass. No colonic wall thickening. Diverticular changes are noted in the left colon without evidence of diverticulitis. Vascular/Lymphatic: There is abdominal aortic atherosclerosis without aneurysm. There is no gastrohepatic or hepatoduodenal ligament lymphadenopathy. No retroperitoneal or mesenteric lymphadenopathy. No pelvic sidewall lymphadenopathy. Reproductive: Heterogeneous attenuation of the uterus suggests fibroid disease. There is no adnexal mass. Other: No intraperitoneal free fluid. Musculoskeletal: No worrisome lytic or sclerotic osseous abnormality. IMPRESSION: 1. Stable index high right paratracheal node with continued further slight decrease in size of the left paraspinal plaque-like pleural nodules. No new or progressive findings in the chest on today's exam. 2. No evidence for metastatic disease in the abdomen or pelvis. 3. Tiny hiatal hernia with similar appearance of mild circumferential wall thickening in the distal esophagus comparing to study of 11/26/2019. 4. Aortic Atherosclerosis (ICD10-I70.0). Electronically Signed   By: Misty Stanley M.D.   On: 06/23/2020 13:29   CT Chest Wo Contrast  Result Date: 06/23/2020 CLINICAL DATA:  Non-small-cell lung cancer.  Restaging. EXAM: CT CHEST, ABDOMEN AND PELVIS WITHOUT CONTRAST TECHNIQUE: Multidetector CT imaging of the chest, abdomen and pelvis was performed following the standard protocol without IV contrast. COMPARISON:  Chest CT 02/25/2020. Chest abdomen pelvis CT 11/26/2019. FINDINGS: CT CHEST FINDINGS Cardiovascular: The heart size is normal. No substantial pericardial effusion. Coronary artery calcification is evident. Atherosclerotic calcification is noted in the wall of the thoracic aorta. Mediastinum/Nodes: Index high right paratracheal node is  stable at 8 mm short axis (image 12/series 2). No new mediastinal lymphadenopathy. No evidence for gross hilar lymphadenopathy although assessment is limited by the lack of intravenous  contrast on today's study. Similar mild circumferential wall thickness of the distal esophagus. There is no axillary lymphadenopathy. Lungs/Pleura: Volume loss left hemithorax compatible with prior left lower lobectomy. Stable appearance of chronic atelectasis or scarring in the right lower lobe. No new suspicious pulmonary nodule or mass. No focal airspace consolidation. There is no evidence of pleural effusion. Index left paraspinal pleural lesion measured previously at 2 mm is almost not measurable today, at about 1-2 mm (image 27/series 2). More inferior paraspinal left pleural nodule measured previously at 1.9 x 0.5 cm is now 1.6 x 0.4 cm (43/2). Index nodular pleural thickening posterior to the transverse aorta measured previously at 1.0 x 0.4 cm is now 0.9 x 0.3 cm (21/2). Musculoskeletal: No worrisome lytic or sclerotic osseous abnormality. CT ABDOMEN PELVIS FINDINGS Hepatobiliary: No focal abnormality in the liver on this study without intravenous contrast. There is no evidence for gallstones, gallbladder wall thickening, or pericholecystic fluid. No intrahepatic or extrahepatic biliary dilation. Pancreas: No focal mass lesion. No dilatation of the main duct. No intraparenchymal cyst. No peripancreatic edema. Spleen: No splenomegaly. No focal mass lesion. Adrenals/Urinary Tract: No adrenal nodule or mass. Unremarkable noncontrast appearance of the kidneys. No evidence for hydroureter. The urinary bladder appears normal for the degree of distention. Stomach/Bowel: Tiny hiatal hernia. Stomach otherwise unremarkable. Duodenum is normally positioned as is the ligament of Treitz. No small bowel wall thickening. No small bowel dilatation. The terminal ileum is normal. The appendix is normal. No gross colonic mass. No colonic wall thickening. Diverticular changes are noted in the left colon without evidence of diverticulitis. Vascular/Lymphatic: There is abdominal aortic atherosclerosis without aneurysm. There is  no gastrohepatic or hepatoduodenal ligament lymphadenopathy. No retroperitoneal or mesenteric lymphadenopathy. No pelvic sidewall lymphadenopathy. Reproductive: Heterogeneous attenuation of the uterus suggests fibroid disease. There is no adnexal mass. Other: No intraperitoneal free fluid. Musculoskeletal: No worrisome lytic or sclerotic osseous abnormality. IMPRESSION: 1. Stable index high right paratracheal node with continued further slight decrease in size of the left paraspinal plaque-like pleural nodules. No new or progressive findings in the chest on today's exam. 2. No evidence for metastatic disease in the abdomen or pelvis. 3. Tiny hiatal hernia with similar appearance of mild circumferential wall thickening in the distal esophagus comparing to study of 11/26/2019. 4. Aortic Atherosclerosis (ICD10-I70.0). Electronically Signed   By: Misty Stanley M.D.   On: 06/23/2020 13:29    ASSESSMENT AND PLAN: This is a very pleasant 70 years old white female with a stage IIa non-small cell lung cancer, adenocarcinoma with positive EGFR mutation in exon 21 (L858R) status post left lower lobectomy with lymph node dissection under the care of Dr. Roxan Hockey. The patient is currently on observation.  She declined to proceed with adjuvant systemic chemotherapy or targeted therapy. The patient has been in observation since her surgical resection. Recent CT scan of the chest followed by PET scan showed suspicious pleural-based mass and nodularity in the left lung concerning for disease recurrence.  Her disease recurrence was confirmed with repeat biopsy of the pleural-based left lung mass. We had molecular studies on the new biopsy and it was consistent with EGFR mutation exon 21 (L858R). The patient started treatment with Tagrisso 80 mg p.o. daily status post 12 months of treatment.  The patient has been tolerating this treatment well with no concerning adverse  effects. She had repeat CT scan of the chest,  abdomen pelvis performed earlier today.  I personally and independently reviewed the scans and discussed the results with the patient and her husband.  Her scan showed further improvement of her disease with no concerning findings for progression. I recommended for her to continue her current treatment with Tagrisso with the same dose. For the persistent anemia of chronic disease, she will continue on the oral iron tablet but I asked her to double her dose of the liquid iron. The patient will come back for follow-up visit in 2 months for evaluation and repeat blood work. She was advised to call immediately if she has any concerning symptoms in the interval. The patient voices understanding of current disease status and treatment options and is in agreement with the current care plan. All questions were answered. The patient knows to call the clinic with any problems, questions or concerns. We can certainly see the patient much sooner if necessary. The total time spent in the appointment was 32 minutes.  Disclaimer: This note was dictated with voice recognition software. Similar sounding words can inadvertently be transcribed and may not be corrected upon review.

## 2020-06-23 NOTE — Progress Notes (Signed)
[address]       [place], [place] [address]             [phone]     HPI: [name] returns for a scheduled follow-up visit  [name] is a 70 year old woman with history of hypertension, hyperlipidemia, reflux, aortic atherosclerosis, migraines, anemia, numerous medication allergies, and stage IIa adenocarcinoma of the left lower lobe with recurrence.  I did a thoracoscopic left lower lobectomy on her for a stage IIa adenocarcinoma in January 2020.  Her postoperative course was complicated by prolonged air leak.  She elected not to participate in chemotherapy or any clinical trials.  In February 2021 she was feeling well but her CT showed some abnormalities.  A PET/CT confirmed does not show there was some metabolic activity.  A needle biopsy was positive for recurrence.  Molecular testing was positive for EGFR and she was started on Tagrisso.  She saw Dr. Arbutus Ped earlier today.  She has had an excellent response to Tagrisso.  She is having minimal side effects.  She says she is never really recovered her sense of taste and smell completely since surgery and Covid.  She has not had any acute respiratory issues but notices that her exercise tolerance is not what it was prior to surgery.  Past Medical History:  Diagnosis Date  . Anemia   . Atherosclerosis   . GERD (gastroesophageal reflux disease)   . Hemoptysis   . Hyperlipidemia   . Hypertension   . IBS (irritable bowel syndrome)   . Lung mass   . Migraines   . Spondylosis   . Vitamin D deficiency     Current Outpatient Medications  Medication Sig Dispense Refill  . Carboxymethylcellul-Glycerin (LUBRICATING EYE DROPS OP) Place 1 drop into both eyes daily as needed (dry eyes).    . Cholecalciferol (VITAMIN D3) LIQD Take 4,000 Units by mouth daily.    Marland Kitchen diltiazem (TIAZAC) 180 MG 24 hr capsule Take 180 mg by mouth daily in the afternoon.     Marland Kitchen EPINEPHrine 0.3 mg/0.3 mL IJ SOAJ injection Inject 0.3 mg into  the muscle as needed for anaphylaxis.     . Omega-3 Fatty Acids (FISH OIL PO) Take 2 capsules by mouth daily.    Marland Kitchen osimertinib mesylate (TAGRISSO) 80 MG tablet TAKE 1 TABLET (80 MG TOTAL) BY MOUTH DAILY. 90 tablet 1  . OVER THE COUNTER MEDICATION Triamcinolone, urea and menthol and camphor    . PRESCRIPTION MEDICATION Apply 1 application topically 2 (two) times daily as needed for itching. Dr Dallas Schimke 2019    . rizatriptan (MAXALT) 10 MG tablet Take 5 mg by mouth as needed for migraine. May repeat in 2 hours if needed    . vitamin B-12 (CYANOCOBALAMIN) 250 MCG tablet Take 250 mcg by mouth daily.    . Cholecalciferol 50 MCG (2000 UT) TABS Take 4,000 Units by mouth daily.     No current facility-administered medications for this visit.    Physical Exam BP (!) 150/80   Pulse 76   Resp 20   Ht 5\' 4"  (1.626 m)   Wt 138 lb (62.6 kg)   SpO2 98% Comment: RA  BMI 23.73 kg/m  70 year old woman in no acute distress Alert and oriented x3 with no focal deficits Lungs diminished at left base but otherwise clear Cardiac regular rate and rhythm No cervical or supraclavicular adenopathy  Diagnostic Tests: CT chest abdomen and pelvis 06/23/2020 IMPRESSION: 1. Stable index high right  paratracheal node with continued further slight decrease in size of the left paraspinal plaque-like pleural nodules. No new or progressive findings in the chest on today's exam. 2. No evidence for metastatic disease in the abdomen or pelvis. 3. Tiny hiatal hernia with similar appearance of mild circumferential wall thickening in the distal esophagus comparing to study of 11/26/2019. 4. Aortic Atherosclerosis (ICD10-I70.0).   Electronically Signed   By: Misty Stanley M.D.   On: 06/23/2020 13:29 I personally reviewed the CT images and concur that there is been stability to slight improvement of areas of recurrence.  Impression: [name] is a 70 year old woman history of hypertension, hyperlipidemia,  reflux, aortic atherosclerosis, migraines, anemia, numerous medication allergies, and stage IIa adenocarcinoma of the left lower lobe with recurrence.  I did a thoracoscopic left lower lobectomy and node dissection in January 2020 for a T2, N0, stage II adenocarcinoma.  She declined adjuvant therapy.  She then developed a recurrence about a year later.  That was EGFR are positive and she is being treated with Tagrisso.  She has an excellent response.  She is aware that generally speaking the cancer will recur.  She had a lot of questions about physical activity.  There are no restrictions from my standpoint.  She will have some limitations due to her lung resection but she can push herself to try to increase her exercise tolerance without endangering herself in any way.  Plan: Follow-up as scheduled with Dr. [name] Return in 4 months after CT scan  [name], MD [facility] [phone]

## 2020-06-23 NOTE — Telephone Encounter (Signed)
Scheduled per los. Gave avs and calendar  

## 2020-07-14 ENCOUNTER — Other Ambulatory Visit (HOSPITAL_COMMUNITY): Payer: Self-pay

## 2020-07-18 ENCOUNTER — Other Ambulatory Visit (HOSPITAL_COMMUNITY): Payer: Self-pay

## 2020-07-18 MED FILL — Osimertinib Mesylate Tab 80 MG (Base Equivalent): ORAL | 30 days supply | Qty: 30 | Fill #0 | Status: AC

## 2020-07-19 ENCOUNTER — Other Ambulatory Visit (HOSPITAL_COMMUNITY): Payer: Self-pay

## 2020-07-20 ENCOUNTER — Other Ambulatory Visit (HOSPITAL_COMMUNITY): Payer: Self-pay

## 2020-08-10 ENCOUNTER — Other Ambulatory Visit (HOSPITAL_COMMUNITY): Payer: Self-pay

## 2020-08-10 ENCOUNTER — Other Ambulatory Visit: Payer: Self-pay | Admitting: Internal Medicine

## 2020-08-10 DIAGNOSIS — C3492 Malignant neoplasm of unspecified part of left bronchus or lung: Secondary | ICD-10-CM

## 2020-08-10 MED ORDER — OSIMERTINIB MESYLATE 80 MG PO TABS
ORAL_TABLET | Freq: Every day | ORAL | 1 refills | Status: DC
  Filled 2020-08-10 (×3): qty 30, 30d supply, fill #0
  Filled 2020-09-07: qty 30, 30d supply, fill #1
  Filled 2020-10-07: qty 30, 30d supply, fill #2
  Filled 2020-11-04: qty 30, 30d supply, fill #3
  Filled 2020-12-01: qty 30, 30d supply, fill #4
  Filled 2020-12-29: qty 30, 30d supply, fill #5

## 2020-08-11 ENCOUNTER — Telehealth: Payer: Self-pay | Admitting: Internal Medicine

## 2020-08-11 NOTE — Telephone Encounter (Signed)
R/s 5/10 appt due to provider pal. Called and spoke with patient. Confirmed appt

## 2020-08-17 ENCOUNTER — Other Ambulatory Visit (HOSPITAL_COMMUNITY): Payer: Self-pay

## 2020-08-25 ENCOUNTER — Other Ambulatory Visit: Payer: Medicare Other

## 2020-08-25 ENCOUNTER — Ambulatory Visit: Payer: Medicare Other | Admitting: Internal Medicine

## 2020-09-02 ENCOUNTER — Inpatient Hospital Stay: Payer: Medicare Other | Attending: Internal Medicine

## 2020-09-02 ENCOUNTER — Other Ambulatory Visit: Payer: Self-pay

## 2020-09-02 ENCOUNTER — Inpatient Hospital Stay (HOSPITAL_BASED_OUTPATIENT_CLINIC_OR_DEPARTMENT_OTHER): Payer: Medicare Other | Admitting: Internal Medicine

## 2020-09-02 ENCOUNTER — Telehealth: Payer: Self-pay | Admitting: Internal Medicine

## 2020-09-02 VITALS — BP 157/57 | HR 80 | Temp 97.7°F | Resp 18 | Ht 64.0 in | Wt 137.7 lb

## 2020-09-02 DIAGNOSIS — Z902 Acquired absence of lung [part of]: Secondary | ICD-10-CM | POA: Insufficient documentation

## 2020-09-02 DIAGNOSIS — C3492 Malignant neoplasm of unspecified part of left bronchus or lung: Secondary | ICD-10-CM | POA: Insufficient documentation

## 2020-09-02 DIAGNOSIS — D638 Anemia in other chronic diseases classified elsewhere: Secondary | ICD-10-CM

## 2020-09-02 DIAGNOSIS — Z5111 Encounter for antineoplastic chemotherapy: Secondary | ICD-10-CM

## 2020-09-02 DIAGNOSIS — I10 Essential (primary) hypertension: Secondary | ICD-10-CM | POA: Diagnosis not present

## 2020-09-02 DIAGNOSIS — R197 Diarrhea, unspecified: Secondary | ICD-10-CM | POA: Diagnosis not present

## 2020-09-02 DIAGNOSIS — Z1509 Genetic susceptibility to other malignant neoplasm: Secondary | ICD-10-CM

## 2020-09-02 DIAGNOSIS — Z79899 Other long term (current) drug therapy: Secondary | ICD-10-CM | POA: Insufficient documentation

## 2020-09-02 DIAGNOSIS — C3490 Malignant neoplasm of unspecified part of unspecified bronchus or lung: Secondary | ICD-10-CM

## 2020-09-02 LAB — CBC WITH DIFFERENTIAL (CANCER CENTER ONLY)
Abs Immature Granulocytes: 0.01 10*3/uL (ref 0.00–0.07)
Basophils Absolute: 0 10*3/uL (ref 0.0–0.1)
Basophils Relative: 0 %
Eosinophils Absolute: 0.1 10*3/uL (ref 0.0–0.5)
Eosinophils Relative: 1 %
HCT: 31.2 % — ABNORMAL LOW (ref 36.0–46.0)
Hemoglobin: 10 g/dL — ABNORMAL LOW (ref 12.0–15.0)
Immature Granulocytes: 0 %
Lymphocytes Relative: 41 %
Lymphs Abs: 2.3 10*3/uL (ref 0.7–4.0)
MCH: 31.2 pg (ref 26.0–34.0)
MCHC: 32.1 g/dL (ref 30.0–36.0)
MCV: 97.2 fL (ref 80.0–100.0)
Monocytes Absolute: 0.5 10*3/uL (ref 0.1–1.0)
Monocytes Relative: 9 %
Neutro Abs: 2.6 10*3/uL (ref 1.7–7.7)
Neutrophils Relative %: 49 %
Platelet Count: 251 10*3/uL (ref 150–400)
RBC: 3.21 MIL/uL — ABNORMAL LOW (ref 3.87–5.11)
RDW: 12.6 % (ref 11.5–15.5)
WBC Count: 5.5 10*3/uL (ref 4.0–10.5)
nRBC: 0 % (ref 0.0–0.2)

## 2020-09-02 LAB — CMP (CANCER CENTER ONLY)
ALT: 14 U/L (ref 0–44)
AST: 22 U/L (ref 15–41)
Albumin: 3.9 g/dL (ref 3.5–5.0)
Alkaline Phosphatase: 52 U/L (ref 38–126)
Anion gap: 9 (ref 5–15)
BUN: 15 mg/dL (ref 8–23)
CO2: 27 mmol/L (ref 22–32)
Calcium: 9.1 mg/dL (ref 8.9–10.3)
Chloride: 100 mmol/L (ref 98–111)
Creatinine: 0.83 mg/dL (ref 0.44–1.00)
GFR, Estimated: >60 mL/min (ref >60)
Glucose, Bld: 98 mg/dL (ref 70–99)
Potassium: 4.4 mmol/L (ref 3.5–5.1)
Sodium: 136 mmol/L (ref 135–145)
Total Bilirubin: 0.3 mg/dL (ref 0.3–1.2)
Total Protein: 6.6 g/dL (ref 6.5–8.1)

## 2020-09-02 NOTE — Progress Notes (Signed)
[facility] Telephone:(336) [phone]   Fax:(336) [phone]  OFFICE PROGRESS NOTE  Donald Prose, MD [place] [address]  DIAGNOSIS: Suspicious recurrent non-small cell lung cancer initially diagnosed as stage IIA (T2b, N0, M0) non-small cell lung cancer, adenocarcinoma diagnosed in November 2019  Molecular studies showed EGFR mutation in exon 21 (L858R) PD-L1 expression 0  PRIOR THERAPY: status post left lower lobectomy with lymph node dissection on April 25, 2018 under the care of Dr. Roxan Hockey.  She declined adjuvant therapy  CURRENT THERAPY: Osimertinib (Tagrisso) 80 mg p.o. daily.  First dose was on June 19, 2019.  Status post 42 months of treatment.  INTERVAL HISTORY: [name] 70 y.o. female returns to the clinic today for follow-up visit accompanied by her husband.  The patient is feeling fine today with no concerning complaints.  She has been tolerating her treatment with Tagrisso fairly well with no significant adverse effect except for occasional diarrhea and no significant skin rash.  She denied having any current chest pain, shortness of breath, cough or hemoptysis.  She denied having any fever or chills.  She has no nausea, vomiting, abdominal pain or constipation.  She is planning to go on a road trip for few weeks in July.  She received the second booster dose of COVID-vaccine. The patient is here today for evaluation and repeat blood work.   MEDICAL HISTORY: Past Medical History:  Diagnosis Date  . Anemia   . Atherosclerosis   . GERD (gastroesophageal reflux disease)   . Hemoptysis   . Hyperlipidemia   . Hypertension   . IBS (irritable bowel syndrome)   . Lung mass   . Migraines   . Spondylosis   . Vitamin D deficiency     ALLERGIES:  is allergic to avocado, influenza vaccines, lisinopril, metoprolol, nexium [esomeprazole magnesium], penicillins, pork-derived products, proton pump inhibitors, sulfites, wasp venom  protein, zantac [ranitidine hcl], cefaclor, chlorhexidine, chloroxine, ciprofloxacin hcl, clarithromycin, gatifloxacin, other, sulfa antibiotics, sulfamethoxazole-trimethoprim, eggs or egg-derived products, aspartame, chocolate, dilaudid [hydromorphone hcl], fentanyl, ivp dye [iodinated diagnostic agents], ketorolac, lactase, milk-related compounds, molds & smuts, oxycodone, prednisolone, shellfish allergy, and soap.  MEDICATIONS:  Current Outpatient Medications  Medication Sig Dispense Refill  . Carboxymethylcellul-Glycerin (LUBRICATING EYE DROPS OP) Place 1 drop into both eyes daily as needed (dry eyes).    . Cholecalciferol (VITAMIN D3) LIQD Take 4,000 Units by mouth daily.    . Cholecalciferol 50 MCG (2000 UT) TABS Take 4,000 Units by mouth daily.    Marland Kitchen diltiazem (TIAZAC) 180 MG 24 hr capsule Take 180 mg by mouth daily in the afternoon.     Marland Kitchen EPINEPHrine 0.3 mg/0.3 mL IJ SOAJ injection Inject 0.3 mg into the muscle as needed for anaphylaxis.     . Omega-3 Fatty Acids (FISH OIL PO) Take 2 capsules by mouth daily.    Marland Kitchen osimertinib mesylate (TAGRISSO) 80 MG tablet TAKE 1 TABLET (80 MG TOTAL) BY MOUTH DAILY. 90 tablet 1  . OVER THE COUNTER MEDICATION Triamcinolone, urea and menthol and camphor    . PRESCRIPTION MEDICATION Apply 1 application topically 2 (two) times daily as needed for itching. Dr Molinda Bailiff 2019    . rizatriptan (MAXALT) 10 MG tablet Take 5 mg by mouth as needed for migraine. May repeat in 2 hours if needed    . vitamin B-12 (CYANOCOBALAMIN) 250 MCG tablet Take 250 mcg by mouth daily.     No current facility-administered medications for this visit.  SURGICAL HISTORY:  Past Surgical History:  Procedure Laterality Date  . BROW LIFT AND BLEPHAROPLASTY  03/15/2013  . CATARACT EXTRACTION, BILATERAL  06/23/2014  . CHEST TUBE INSERTION Left 05/10/2018   Procedure: CHEST TUBE INSERTION;  Surgeon: [name], MD;  Location: [facility];  Service: Thoracic;  Laterality: Left;  .  COLONOSCOPY  05/2014  . ESOPHAGOGASTRODUODENOSCOPY  2019  . EYE SURGERY    . IR THORACENTESIS ASP PLEURAL SPACE W/IMG GUIDE  05/24/2018  . IR THORACENTESIS ASP PLEURAL SPACE W/IMG GUIDE  06/14/2018  . TONSILLECTOMY    . VIDEO ASSISTED THORACOSCOPY (VATS)/ LOBECTOMY Left 04/25/2018   Procedure: VIDEO ASSISTED THORACOSCOPY (VATS)LEFT LOWER LOBECTOMY, NODE DISSECTION;  Surgeon: [name], MD;  Location: [facility];  Service: Thoracic;  Laterality: Left;  Marland Kitchen VIDEO BRONCHOSCOPY WITH ENDOBRONCHIAL NAVIGATION N/A 03/07/2018   Procedure: VIDEO BRONCHOSCOPY WITH ENDOBRONCHIAL NAVIGATION;  Surgeon: Collene Gobble, MD;  Location[facility];  Service: Thoracic;  Laterality: N/A;  . VIDEO BRONCHOSCOPY WITH INSERTION OF INTERBRONCHIAL VALVE (IBV) N/A 05/10/2018   Procedure: VIDEO BRONCHOSCOPY WITH INSERTION OF INTERBRONCHIAL VALVE (IBV);  Surgeon: [name], MD;  Location: [facility];  Service: Thoracic;  Laterality: N/A;  . VIDEO BRONCHOSCOPY WITH INSERTION OF INTERBRONCHIAL VALVE (IBV) N/A 06/13/2018   Procedure: VIDEO BRONCHOSCOPY WITH REMOVAL OF INTERBRONCHIAL VALVE (IBV) x 2;  Surgeon: [name], MD;  Location: [facility];  Service: Thoracic;  Laterality: N/A;    REVIEW OF SYSTEMS:  A comprehensive review of systems was negative except for: Constitutional: positive for fatigue   PHYSICAL EXAMINATION: General appearance: alert, cooperative and no distress Head: Normocephalic, without obvious abnormality, atraumatic Neck: no adenopathy, no JVD, supple, symmetrical, trachea midline and thyroid not enlarged, symmetric, no tenderness/mass/nodules Lymph nodes: Cervical, supraclavicular, and axillary nodes normal. Resp: clear to auscultation bilaterally Back: symmetric, no curvature. ROM normal. No CVA tenderness. Cardio: regular rate and rhythm, S1, S2 normal, no murmur, click, rub or gallop GI: soft, non-tender; bowel sounds normal; no masses,  no organomegaly Extremities: extremities normal,  atraumatic, no cyanosis or edema   ECOG PERFORMANCE STATUS: 0 - Asymptomatic  Blood pressure (!) 157/57, pulse 80, temperature 97.7 F (36.5 C), temperature source Tympanic, resp. rate 18, height $RemoveBe'5\' 4"'cYrUDROVP$  (1.626 m), weight 137 lb 11.2 oz (62.5 kg), SpO2 99 %.   LABORATORY DATA: Lab Results  Component Value Date   WBC 5.5 09/02/2020   HGB 10.0 (L) 09/02/2020   HCT 31.2 (L) 09/02/2020   MCV 97.2 09/02/2020   PLT 251 09/02/2020      Chemistry      Component Value Date/Time   NA 136 09/02/2020 1428   K 4.4 09/02/2020 1428   CL 100 09/02/2020 1428   CO2 27 09/02/2020 1428   BUN 15 09/02/2020 1428   CREATININE 0.83 09/02/2020 1428      Component Value Date/Time   CALCIUM 9.1 09/02/2020 1428   ALKPHOS 52 09/02/2020 1428   AST 22 09/02/2020 1428   ALT 14 09/02/2020 1428   BILITOT 0.3 09/02/2020 1428       RADIOGRAPHIC STUDIES: No results found.  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 Roxan Hockey. The patient is currently on observation.  She declined to proceed with adjuvant systemic chemotherapy or targeted therapy. The patient has  been in observation since her surgical resection. Recent CT scan of the chest followed by PET scan showed suspicious pleural-based mass and nodularity in the left lung concerning for disease recurrence.  Her disease recurrence was confirmed with repeat biopsy of the pleural-based left lung mass. We had molecular studies on the new biopsy and it was consistent with EGFR mutation exon 21 (L858R). The patient started treatment with Tagrisso 80 mg p.o. daily status post 14 months of treatment.  She has been tolerating this treatment well with no significant adverse effect except for occasional diarrhea. I recommended for the patient to  continue her treatment with Tagrisso with the same dose for now. I will see her back for follow-up visit in around 6 weeks with repeat CT scan of the chest for restaging of her disease. For the hypertension she will continue with her blood pressure medication and monitor it closely at home. For the persistent anemia of chronic disease, she will stay on oral iron few times a week as needed. The patient was advised to call immediately if she has any other concerning symptoms in the interval. The patient voices understanding of current disease status and treatment options and is in agreement with the current care plan. All questions were answered. The patient knows to call the clinic with any problems, questions or concerns. We can certainly see the patient much sooner if necessary.  Disclaimer: This note was dictated with voice recognition software. Similar sounding words can inadvertently be transcribed and may not be corrected upon review.

## 2020-09-02 NOTE — Telephone Encounter (Signed)
Scheduled follow-up appointment per 5/18 los. Patient is aware.

## 2020-09-07 ENCOUNTER — Other Ambulatory Visit (HOSPITAL_COMMUNITY): Payer: Self-pay

## 2020-09-09 ENCOUNTER — Other Ambulatory Visit (HOSPITAL_COMMUNITY): Payer: Self-pay

## 2020-10-07 ENCOUNTER — Other Ambulatory Visit (HOSPITAL_COMMUNITY): Payer: Self-pay

## 2020-10-07 ENCOUNTER — Encounter: Payer: Self-pay | Admitting: Internal Medicine

## 2020-10-14 ENCOUNTER — Other Ambulatory Visit: Payer: Self-pay

## 2020-10-14 ENCOUNTER — Ambulatory Visit (HOSPITAL_COMMUNITY)
Admission: RE | Admit: 2020-10-14 | Discharge: 2020-10-14 | Disposition: A | Discharge status: Home / Self Care | Payer: Medicare Other | Source: Ambulatory Visit | Attending: Internal Medicine | Admitting: Internal Medicine

## 2020-10-14 ENCOUNTER — Other Ambulatory Visit (HOSPITAL_COMMUNITY): Payer: Self-pay

## 2020-10-14 ENCOUNTER — Inpatient Hospital Stay: Payer: Medicare Other | Attending: Internal Medicine | Admitting: Internal Medicine

## 2020-10-14 ENCOUNTER — Inpatient Hospital Stay: Payer: Medicare Other

## 2020-10-14 VITALS — BP 163/62 | HR 88 | Temp 97.2°F | Resp 18 | Ht 64.0 in | Wt 138.1 lb

## 2020-10-14 DIAGNOSIS — D638 Anemia in other chronic diseases classified elsewhere: Secondary | ICD-10-CM | POA: Diagnosis not present

## 2020-10-14 DIAGNOSIS — I10 Essential (primary) hypertension: Secondary | ICD-10-CM | POA: Insufficient documentation

## 2020-10-14 DIAGNOSIS — Z902 Acquired absence of lung [part of]: Secondary | ICD-10-CM | POA: Diagnosis not present

## 2020-10-14 DIAGNOSIS — Z79899 Other long term (current) drug therapy: Secondary | ICD-10-CM | POA: Diagnosis not present

## 2020-10-14 DIAGNOSIS — C3490 Malignant neoplasm of unspecified part of unspecified bronchus or lung: Secondary | ICD-10-CM | POA: Insufficient documentation

## 2020-10-14 DIAGNOSIS — C3492 Malignant neoplasm of unspecified part of left bronchus or lung: Secondary | ICD-10-CM | POA: Insufficient documentation

## 2020-10-14 DIAGNOSIS — Z1509 Genetic susceptibility to other malignant neoplasm: Secondary | ICD-10-CM | POA: Diagnosis not present

## 2020-10-14 DIAGNOSIS — Z5111 Encounter for antineoplastic chemotherapy: Secondary | ICD-10-CM

## 2020-10-14 LAB — CBC WITH DIFFERENTIAL (CANCER CENTER ONLY)
Abs Immature Granulocytes: 0.02 10*3/uL (ref 0.00–0.07)
Basophils Absolute: 0 10*3/uL (ref 0.0–0.1)
Basophils Relative: 0 %
Eosinophils Absolute: 0.1 10*3/uL (ref 0.0–0.5)
Eosinophils Relative: 1 %
HCT: 30.3 % — ABNORMAL LOW (ref 36.0–46.0)
Hemoglobin: 10.1 g/dL — ABNORMAL LOW (ref 12.0–15.0)
Immature Granulocytes: 0 %
Lymphocytes Relative: 36 %
Lymphs Abs: 2.3 10*3/uL (ref 0.7–4.0)
MCH: 32.1 pg (ref 26.0–34.0)
MCHC: 33.3 g/dL (ref 30.0–36.0)
MCV: 96.2 fL (ref 80.0–100.0)
Monocytes Absolute: 0.6 10*3/uL (ref 0.1–1.0)
Monocytes Relative: 10 %
Neutro Abs: 3.5 10*3/uL (ref 1.7–7.7)
Neutrophils Relative %: 53 %
Platelet Count: 218 10*3/uL (ref 150–400)
RBC: 3.15 MIL/uL — ABNORMAL LOW (ref 3.87–5.11)
RDW: 12.9 % (ref 11.5–15.5)
WBC Count: 6.6 10*3/uL (ref 4.0–10.5)
nRBC: 0 % (ref 0.0–0.2)

## 2020-10-14 LAB — CMP (CANCER CENTER ONLY)
ALT: 15 U/L (ref 0–44)
AST: 21 U/L (ref 15–41)
Albumin: 3.7 g/dL (ref 3.5–5.0)
Alkaline Phosphatase: 57 U/L (ref 38–126)
Anion gap: 9 (ref 5–15)
BUN: 12 mg/dL (ref 8–23)
CO2: 26 mmol/L (ref 22–32)
Calcium: 9.3 mg/dL (ref 8.9–10.3)
Chloride: 100 mmol/L (ref 98–111)
Creatinine: 0.81 mg/dL (ref 0.44–1.00)
GFR, Estimated: >60 mL/min (ref >60)
Glucose, Bld: 95 mg/dL (ref 70–99)
Potassium: 4.2 mmol/L (ref 3.5–5.1)
Sodium: 135 mmol/L (ref 135–145)
Total Bilirubin: 0.4 mg/dL (ref 0.3–1.2)
Total Protein: 6.6 g/dL (ref 6.5–8.1)

## 2020-10-14 NOTE — Progress Notes (Signed)
[facility] Telephone:(336) [phone]   Fax:(336) [phone]  OFFICE PROGRESS NOTE  Donald Prose, MD [place] [address]  DIAGNOSIS: Suspicious recurrent non-small cell lung cancer initially diagnosed as stage IIA (T2b, N0, M0) non-small cell lung cancer, adenocarcinoma diagnosed in November 2019  Molecular studies showed EGFR mutation in exon 21 (L858R) PD-L1 expression 0  PRIOR THERAPY: status post left lower lobectomy with lymph node dissection on April 25, 2018 under the care of Dr. Roxan Hockey.  She declined adjuvant therapy  CURRENT THERAPY: Osimertinib (Tagrisso) 80 mg p.o. daily.  First dose was on June 19, 2019.  Status post 17 months of treatment.  INTERVAL HISTORY: [name] 70 y.o. female returns to the clinic today for follow-up visit accompanied by her husband.  The patient is feeling fine today with no concerning complaints except for occasional fatigue.  She came back from a trip to 5 states up [place] few weeks ago.  The patient has no significant chest pain, shortness of breath, cough or hemoptysis.  She denied having any fever or chills.  She has no nausea, vomiting, diarrhea but occasional constipation.  She has no abdominal pain.  She denied having any significant weight loss or night sweats.  She has no fever or chills.  She had repeat CT scan of the chest performed earlier today and she is here for evaluation and discussion of her lab and scan results.   MEDICAL HISTORY: Past Medical History:  Diagnosis Date   Anemia    Atherosclerosis    GERD (gastroesophageal reflux disease)    Hemoptysis    Hyperlipidemia    Hypertension    IBS (irritable bowel syndrome)    Lung mass    Migraines    Spondylosis    Vitamin D deficiency     ALLERGIES:  is allergic to avocado, influenza vaccines, lisinopril, metoprolol, nexium [esomeprazole magnesium], penicillins, pork-derived products, proton pump inhibitors, sulfites, wasp  venom protein, zantac [ranitidine hcl], cefaclor, chlorhexidine, chloroxine, ciprofloxacin hcl, clarithromycin, gatifloxacin, other, sulfa antibiotics, sulfamethoxazole-trimethoprim, eggs or egg-derived products, aspartame, chocolate, dilaudid [hydromorphone hcl], fentanyl, ivp dye [iodinated diagnostic agents], ketorolac, lactase, milk-related compounds, molds & smuts, oxycodone, prednisolone, shellfish allergy, and soap.  MEDICATIONS:  Current Outpatient Medications  Medication Sig Dispense Refill   Carboxymethylcellul-Glycerin (LUBRICATING EYE DROPS OP) Place 1 drop into both eyes daily as needed (dry eyes).     Cholecalciferol (VITAMIN D3) LIQD Take 4,000 Units by mouth daily.     diltiazem (TIAZAC) 180 MG 24 hr capsule Take 180 mg by mouth daily in the afternoon.      EPINEPHrine 0.3 mg/0.3 mL IJ SOAJ injection Inject 0.3 mg into the muscle as needed for anaphylaxis.      Ferrous Sulfate (IRON PO) Take by mouth.     Omega 3-6-9 Fatty Acids (OMEGA-3 FUSION) LIQD Take by mouth.     Omega-3 Fatty Acids (FISH OIL PO) Take 2 capsules by mouth daily.     osimertinib mesylate (TAGRISSO) 80 MG tablet TAKE 1 TABLET (80 MG TOTAL) BY MOUTH DAILY. 90 tablet 1   OVER THE COUNTER MEDICATION Triamcinolone, urea and menthol and camphor     PRESCRIPTION MEDICATION Apply 1 application topically 2 (two) times daily as needed for itching. Dr Molinda Bailiff 2019     rizatriptan (MAXALT) 10 MG tablet Take 5 mg by mouth as needed for migraine. May repeat in 2 hours if needed     vitamin B-12 (CYANOCOBALAMIN) 250 MCG  tablet Take 500 mcg by mouth daily.     No current facility-administered medications for this visit.    SURGICAL HISTORY:  Past Surgical History:  Procedure Laterality Date   BROW LIFT AND BLEPHAROPLASTY  03/15/2013   CATARACT EXTRACTION, BILATERAL  06/23/2014   CHEST TUBE INSERTION Left 05/10/2018   Procedure: CHEST TUBE INSERTION;  Surgeon: [name], MD;  Location: [facility];  Service:  Thoracic;  Laterality: Left;   COLONOSCOPY  05/2014   ESOPHAGOGASTRODUODENOSCOPY  2019   EYE SURGERY     IR THORACENTESIS ASP PLEURAL SPACE W/IMG GUIDE  05/24/2018   IR THORACENTESIS ASP PLEURAL SPACE W/IMG GUIDE  06/14/2018   TONSILLECTOMY     VIDEO ASSISTED THORACOSCOPY (VATS)/ LOBECTOMY Left 04/25/2018   Procedure: VIDEO ASSISTED THORACOSCOPY (VATS)LEFT LOWER LOBECTOMY, NODE DISSECTION;  Surgeon: [name], MD;  Location: [facility];  Service: Thoracic;  Laterality: Left;   VIDEO BRONCHOSCOPY WITH ENDOBRONCHIAL NAVIGATION N/A 03/07/2018   Procedure: VIDEO BRONCHOSCOPY WITH ENDOBRONCHIAL NAVIGATION;  Surgeon: Collene Gobble, MD;  Location[facility];  Service: Thoracic;  Laterality: N/A;   VIDEO BRONCHOSCOPY WITH INSERTION OF INTERBRONCHIAL VALVE (IBV) N/A 05/10/2018   Procedure: VIDEO BRONCHOSCOPY WITH INSERTION OF INTERBRONCHIAL VALVE (IBV);  Surgeon: [name], MD;  Location: [facility];  Service: Thoracic;  Laterality: N/A;   VIDEO BRONCHOSCOPY WITH INSERTION OF INTERBRONCHIAL VALVE (IBV) N/A 06/13/2018   Procedure: VIDEO BRONCHOSCOPY WITH REMOVAL OF INTERBRONCHIAL VALVE (IBV) x 2;  Surgeon: [name], MD;  Location: [facility];  Service: Thoracic;  Laterality: N/A;    REVIEW OF SYSTEMS:  Constitutional: positive for fatigue Eyes: negative Ears, nose, mouth, throat, and face: negative Respiratory: negative Cardiovascular: negative Gastrointestinal: negative Genitourinary:negative Integument/breast: negative Hematologic/lymphatic: negative Musculoskeletal:negative Neurological: negative Behavioral/Psych: negative Endocrine: negative Allergic/Immunologic: negative   PHYSICAL EXAMINATION: General appearance: alert, cooperative, and no distress Head: Normocephalic, without obvious abnormality, atraumatic Neck: no adenopathy, no JVD, supple, symmetrical, trachea midline, and thyroid not enlarged, symmetric, no tenderness/mass/nodules Lymph nodes: Cervical,  supraclavicular, and axillary nodes normal. Resp: clear to auscultation bilaterally Back: symmetric, no curvature. ROM normal. No CVA tenderness. Cardio: regular rate and rhythm, S1, S2 normal, no murmur, click, rub or gallop GI: soft, non-tender; bowel sounds normal; no masses,  no organomegaly Extremities: extremities normal, atraumatic, no cyanosis or edema Neurologic: Alert and oriented X 3, normal strength and tone. Normal symmetric reflexes. Normal coordination and gait   ECOG PERFORMANCE STATUS: 1 - Symptomatic but completely ambulatory  Blood pressure (!) 163/62, pulse 88, temperature (!) 97.2 F (36.2 C), temperature source Tympanic, resp. rate 18, height $RemoveBe'5\' 4"'jyJgGSktm$  (1.626 m), weight 138 lb 1.6 oz (62.6 kg), SpO2 98 %.   LABORATORY DATA: Lab Results  Component Value Date   WBC 6.6 10/14/2020   HGB 10.1 (L) 10/14/2020   HCT 30.3 (L) 10/14/2020   MCV 96.2 10/14/2020   PLT 218 10/14/2020      Chemistry      Component Value Date/Time   NA 135 10/14/2020 1042   K 4.2 10/14/2020 1042   CL 100 10/14/2020 1042   CO2 26 10/14/2020 1042   BUN 12 10/14/2020 1042   CREATININE 0.81 10/14/2020 1042      Component Value Date/Time   CALCIUM 9.3 10/14/2020 1042   ALKPHOS 57 10/14/2020 1042   AST 21 10/14/2020 1042   ALT 15 10/14/2020 1042   BILITOT 0.4 10/14/2020 1042       RADIOGRAPHIC STUDIES: CT Chest Wo Contrast  Result Date: 10/14/2020  CLINICAL DATA:  Non-small-cell lung cancer. Restaging. History of left lower lobectomy. EXAM: CT CHEST WITHOUT CONTRAST TECHNIQUE: Multidetector CT imaging of the chest was performed following the standard protocol without IV contrast. COMPARISON:  02/25/2020 FINDINGS: Cardiovascular: The heart size is normal. No substantial pericardial effusion. Coronary artery calcification is evident. Atherosclerotic calcification is noted in the wall of the thoracic aorta. Mediastinum/Nodes: 8 mm high right paratracheal node identified previously is unchanged  in the interval (image 29/2). No new mediastinal lymphadenopathy. No evidence for gross hilar lymphadenopathy although assessment is limited by the lack of intravenous contrast on today's study. The esophagus has normal imaging features. Tiny hiatal hernia again noted. There is no axillary lymphadenopathy. Lungs/Pleura: Stable volume loss left hemithorax consistent with left lower lobectomy. No new suspicious pulmonary nodule or mass. No focal airspace consolidation. Bandlike atelectasis or scarring in the right lung base is unchanged. There is no evidence of pleural effusion. The index left paraspinal lesion is unchanged, remaining incredibly subtle with only some minimal soft tissue thickening evident today (see image 68/series 2). 2nd lesion more inferiorly was measured previously at 1.6 x 0.4 cm and is stable today at 1.6 x 0.3 cm (108/2). 3rd left pleural lesion noted posterior to the distal transverse aorta was measured previously at 0.9 x 0.3 cm. This lesion is also stable at 0.9 x 0.3 cm on image 53/2. Upper Abdomen: Unremarkable. Musculoskeletal: No worrisome lytic or sclerotic osseous abnormality. IMPRESSION: 1. Stable exam. No new or progressive interval findings. 2. Index 8 mm high right paratracheal lymph node is unchanged. 3. Stable appearance of the subtle index left pleural lesions 4. Tiny hiatal hernia. 5. Aortic Atherosclerosis (ICD10-I70.0). Electronically Signed   By: Misty Stanley M.D.   On: 10/14/2020 12:40    ASSESSMENT AND PLAN: This is a very pleasant 70 years old white female with a stage IIa non-small cell lung cancer, adenocarcinoma with positive EGFR mutation in exon 21 (L858R) status post left lower lobectomy with lymph node dissection under the care of Dr. Roxan Hockey. The patient is currently on observation.  She declined to proceed with adjuvant systemic chemotherapy or targeted therapy. The patient has been in observation since her surgical resection. Recent CT scan of the  chest followed by PET scan showed suspicious pleural-based mass and nodularity in the left lung concerning for disease recurrence.  Her disease recurrence was confirmed with repeat biopsy of the pleural-based left lung mass. We had molecular studies on the new biopsy and it was consistent with EGFR mutation exon 21 (L858R). The patient started treatment with Tagrisso 80 mg p.o. daily status post almost 17 months of treatment.  She has been tolerating this treatment well with no significant adverse effects. She had repeat CT scan of the chest performed earlier today.  I personally and independently reviewed the scans and discussed the result with the patient and her husband. Her scan showed no concerning findings for disease progression. I recommended for her to continue on [place] with the same dose 80 mg p.o. daily. I will see her back for follow-up visit in around 7 weeks before traveling to [place] to babysit her grand children while her son is out of the country. For the persistent anemia of chronic disease, she will continue with the oral iron tablet few times a week. For the hypertension I strongly encouraged the patient to check her blood pressure regularly at home and to report to her primary care physician for adjustment of her medication if needed. The patient was  advised to call immediately if she has any other concerning symptoms in the interval.  The patient voices understanding of current disease status and treatment options and is in agreement with the current care plan. All questions were answered. The patient knows to call the clinic with any problems, questions or concerns. We can certainly see the patient much sooner if necessary.  Disclaimer: This note was dictated with voice recognition software. Similar sounding words can inadvertently be transcribed and may not be corrected upon review.

## 2020-10-15 ENCOUNTER — Encounter: Payer: Self-pay | Admitting: Internal Medicine

## 2020-10-16 ENCOUNTER — Other Ambulatory Visit (HOSPITAL_COMMUNITY): Payer: Self-pay

## 2020-10-16 NOTE — Telephone Encounter (Signed)
I consulted with the pharmacy who advised that concentrations of Tagrisso can increase when co-administered with Paxlovid, therefore the decision to start/stop Tagrisso while on Paxlovid it left to the discretion of the provider.  Cassandra PA-C discussed with Dr. [name] who advised pt can continue her Tagrisso but if she experiences increased toxicity sx such as diarrhea, vomiting, rash, etc., then she can stop the Tagrisso until she completes Paxlovid.  I spoke with pt and advised of this. Pt states she was under the impression and the [facility] would rx Paxlovid. Pt was advised we do not rx this and she would need to contact her PCP. She states her PCP is unavailable, therefore pt was advised she can maybe schedule a phone appt with an urgent care facility. Pt expressed understanding of this information but feels she will "just tuff it out for now". Pt was advised if her sx worsen, see emergency care.

## 2020-10-20 ENCOUNTER — Ambulatory Visit: Payer: Medicare Other | Admitting: Thoracic Surgery (Cardiothoracic Vascular Surgery)

## 2020-11-04 ENCOUNTER — Other Ambulatory Visit (HOSPITAL_COMMUNITY): Payer: Self-pay

## 2020-11-09 ENCOUNTER — Other Ambulatory Visit (HOSPITAL_COMMUNITY): Payer: Self-pay

## 2020-11-12 IMAGING — DX DG CHEST 1V PORT
1 series · 1 of 1 positions shown · non-contrast
Comparison: 05/02/2018

CLINICAL DATA: Postop lobectomy

EXAM:
PORTABLE CHEST 1 VIEW

[chest ap]
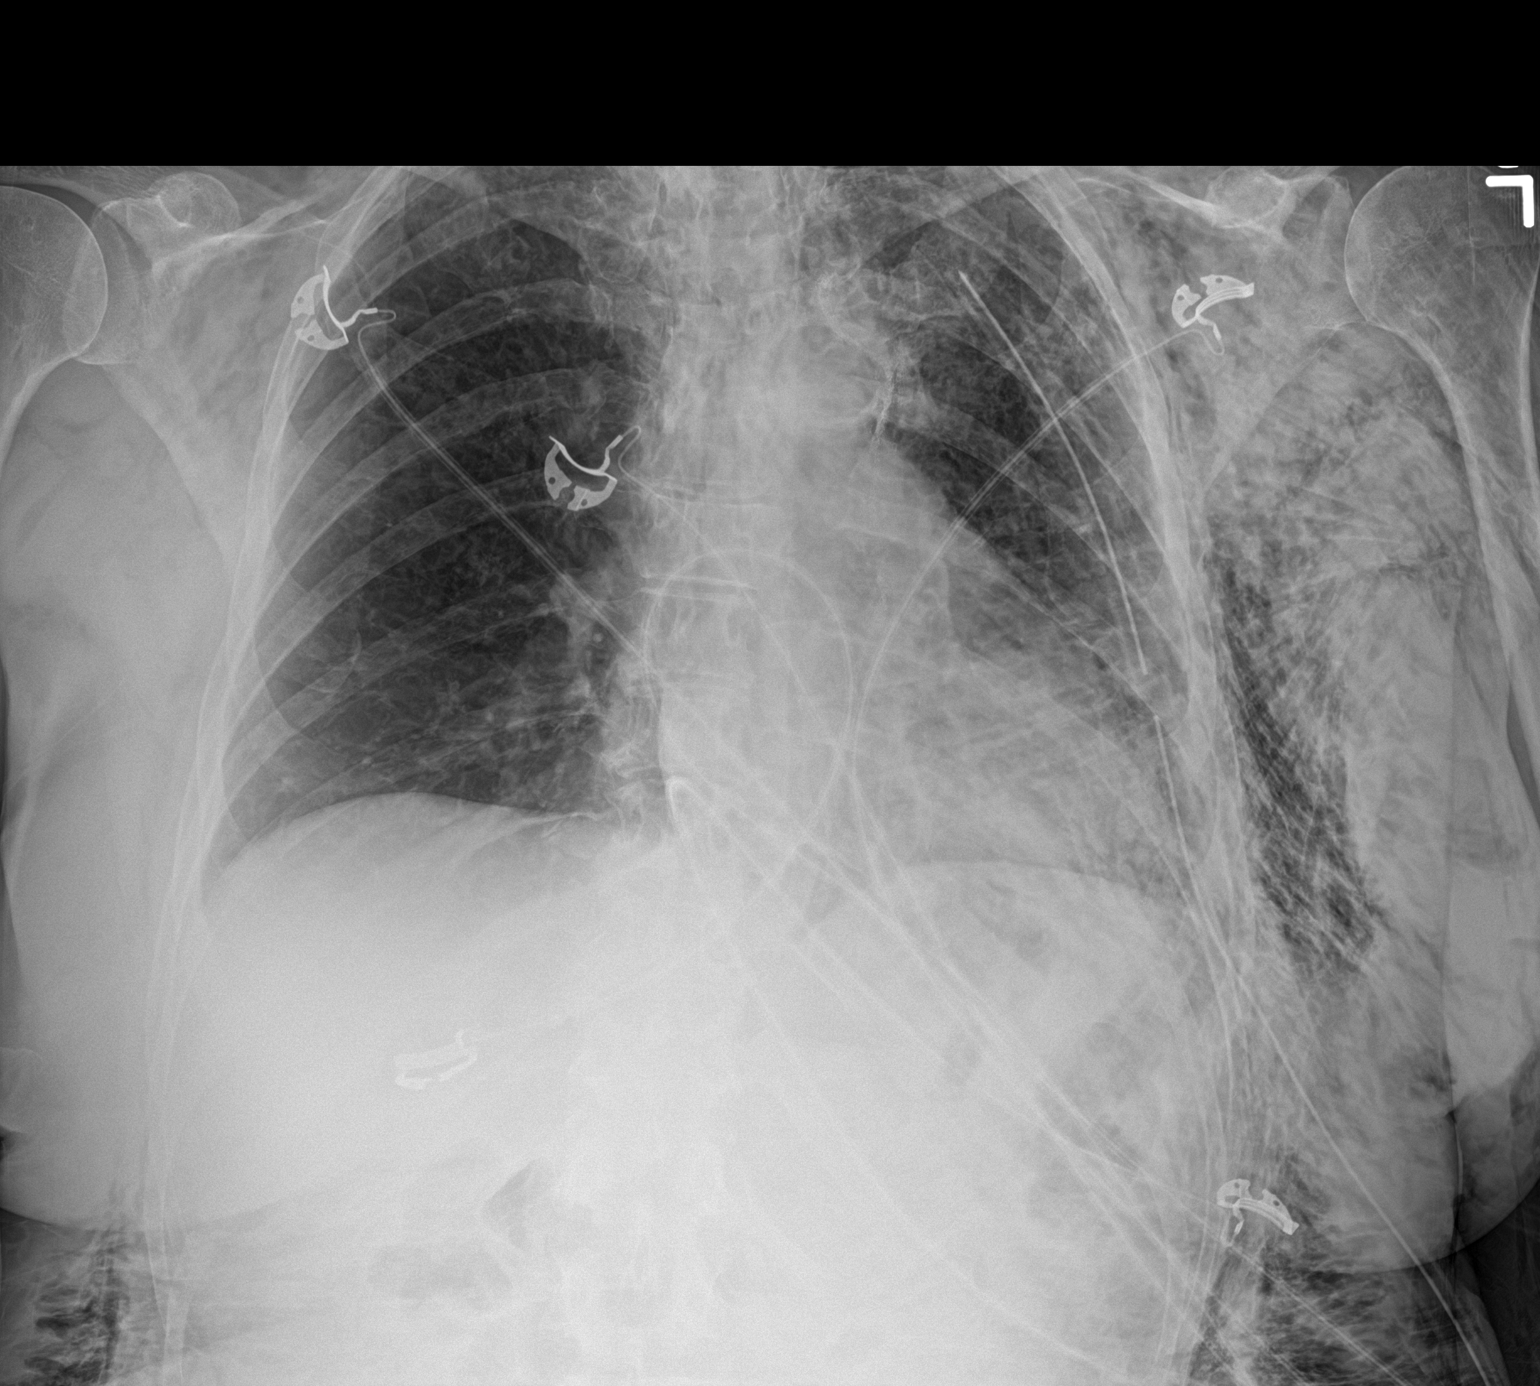

[1 of 1 positions shown; findings below may reference images not displayed]

FINDINGS: Left chest tube remains in place. Small left apical pneumothorax
unchanged. Extensive subcutaneous emphysema left greater than right
appears unchanged.

Right lung is clear. Mild left lower lobe atelectasis is unchanged.
No significant pleural effusion.
IMPRESSION: No significant change.  Small left apical pneumothorax remains.

## 2020-11-13 IMAGING — DX DG CHEST 1V PORT
1 series · 1 of 1 positions shown · non-contrast
Comparison: 05/03/2018

CLINICAL DATA: Follow-up left chest tube

EXAM:
PORTABLE CHEST 1 VIEW

[chest]
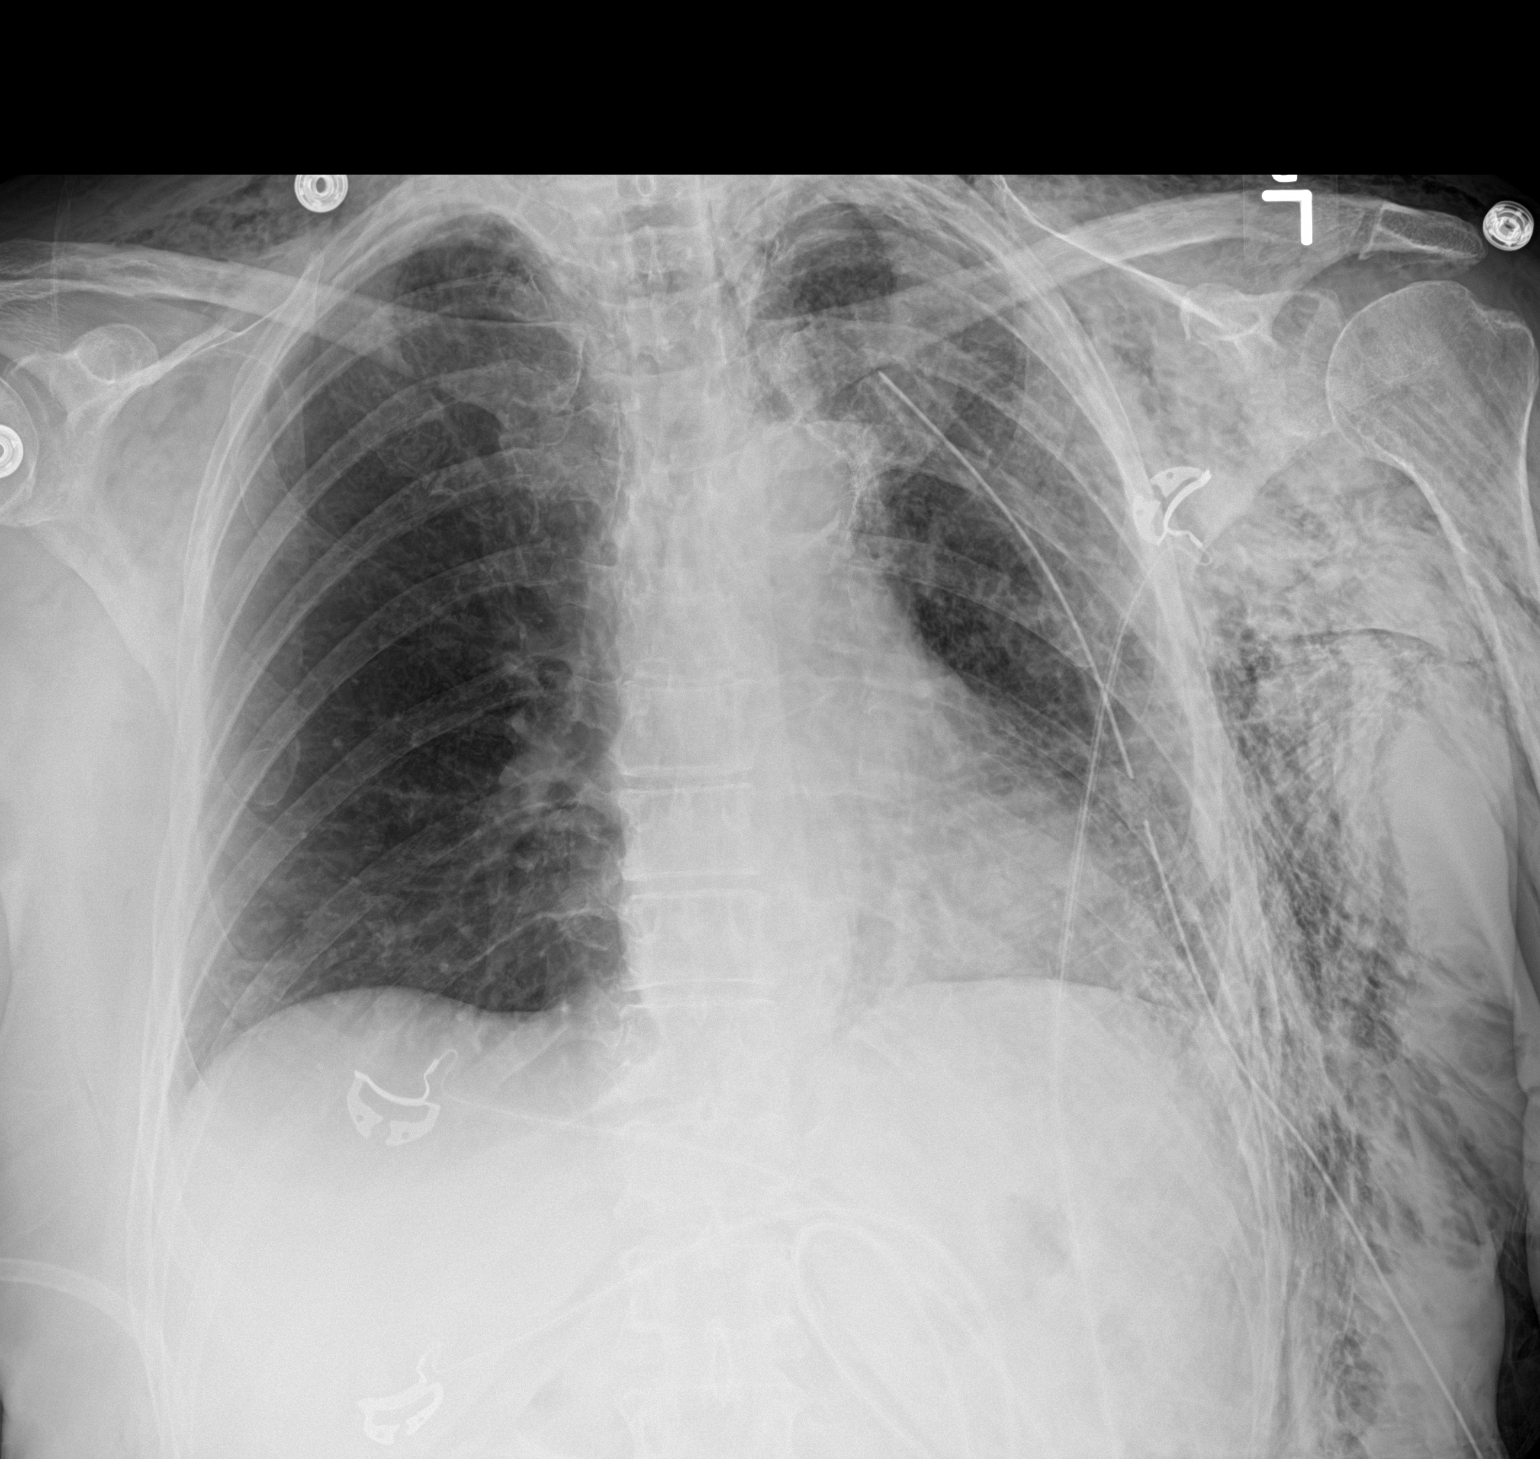

[1 of 1 positions shown; findings below may reference images not displayed]

FINDINGS: Cardiac shadows within normal limits. Aortic calcifications are
again seen. Postsurgical changes on the left are noted consistent
with lobectomy. Left chest tube is noted in place with a
considerable amount of subcutaneous emphysema. No definitive
pneumothorax is noted at this time. The right lung remains clear.
IMPRESSION: Postoperative changes on the left without definitive pneumothorax.

No other focal abnormality is noted.

## 2020-11-17 IMAGING — DX DG CHEST 1V PORT
1 series · 1 of 1 positions shown · non-contrast
Comparison: 05/07/2018.

CLINICAL DATA: Lung surgery.  Chest tube.

EXAM:
PORTABLE CHEST 1 VIEW

[chest]
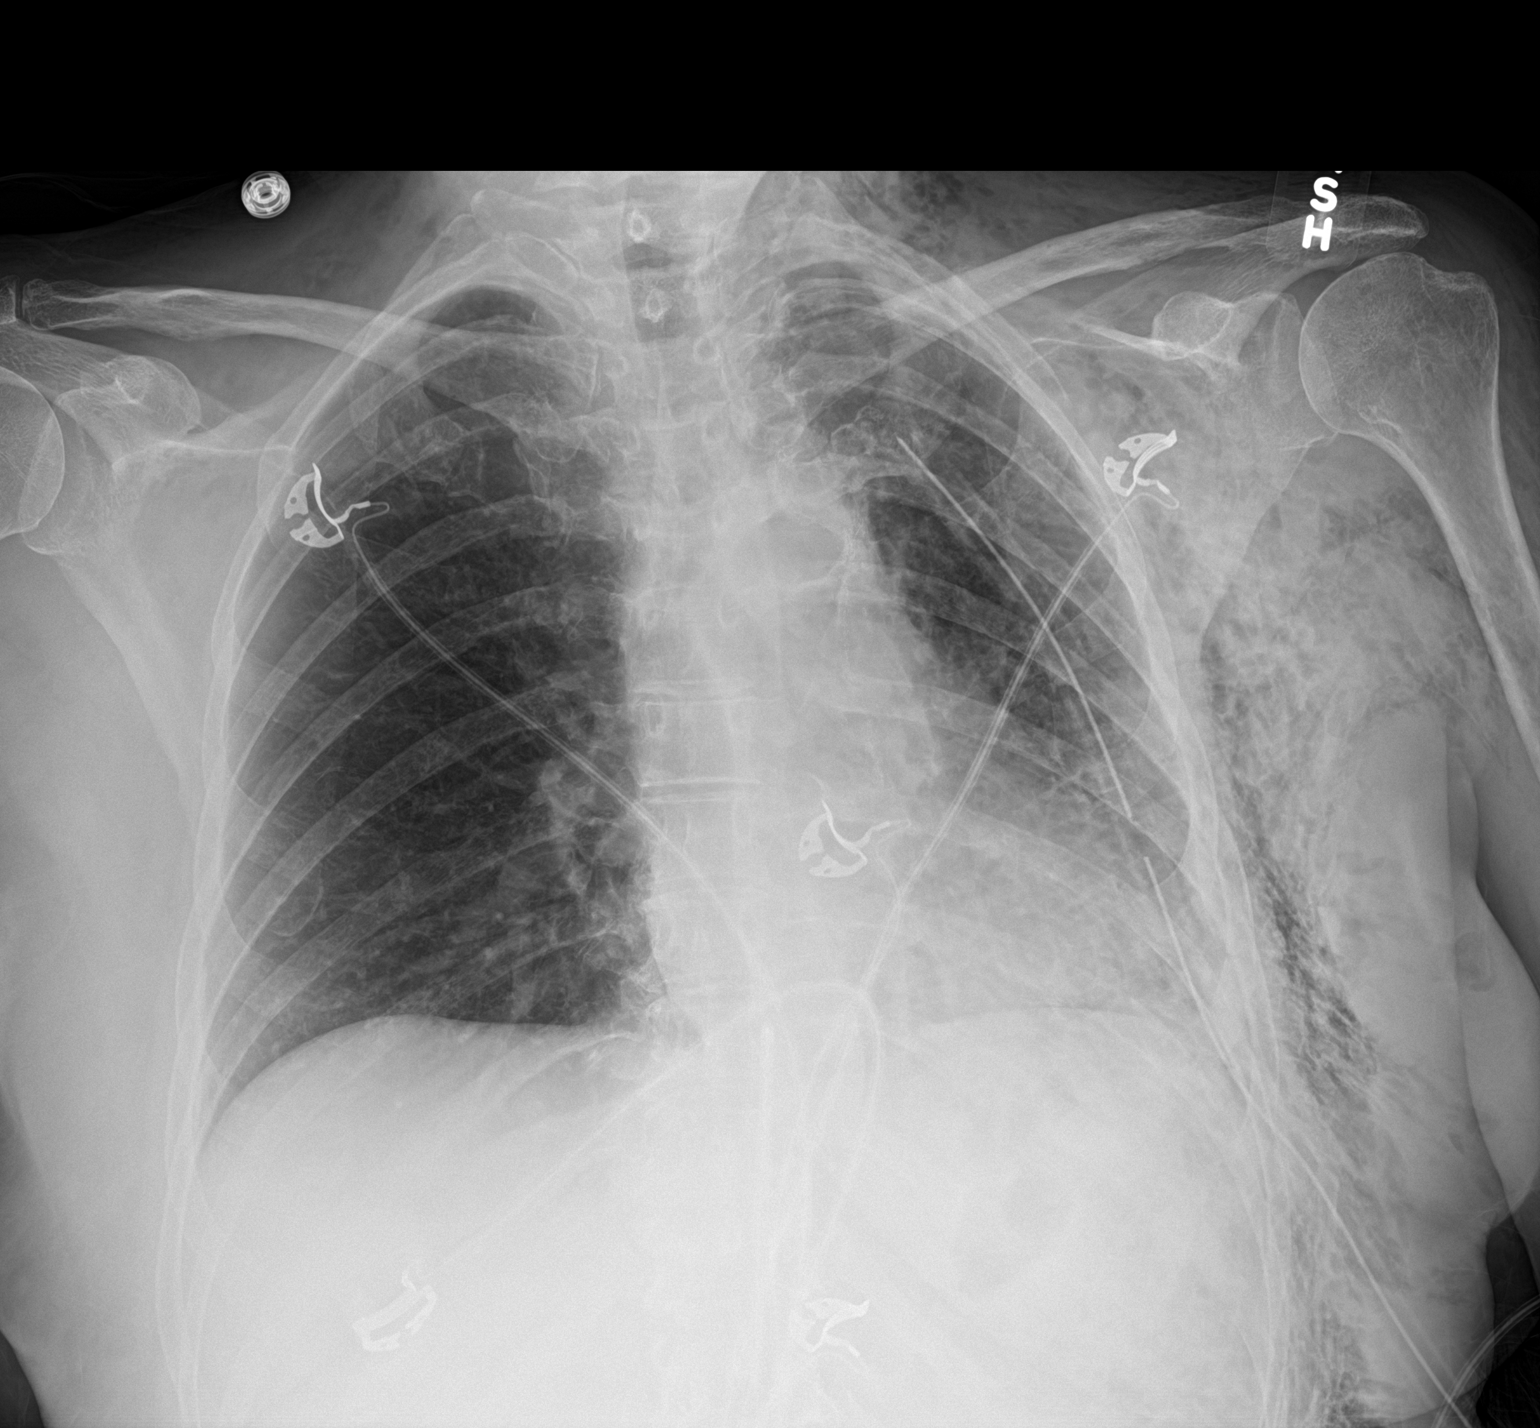

[1 of 1 positions shown; findings below may reference images not displayed]

FINDINGS: Mediastinum and hilar structures stable. Heart size stable. Prior
left upper lobectomy. No acute infiltrates. No pleural effusion.
Tiny left apical pneumothorax again noted. No interim change. Left
chest tube in stable position. Extensive left chest wall
subcutaneous emphysema again noted.
IMPRESSION: 1.Left chest tube in stable position. Tiny left apical pneumothorax
again noted. No interim change. Diffuse left chest wall subcutaneous
emphysema unchanged.

2. Prior left upper lobectomy. No acute pulmonary disease. Heart
size stable.

## 2020-11-19 IMAGING — CR DG CHEST 2V
2 series · 2 of 2 positions shown · non-contrast
Comparison: 05/09/2018

CLINICAL DATA: Status post left lobectomy

EXAM:
CHEST - 2 VIEW

[chest pa]
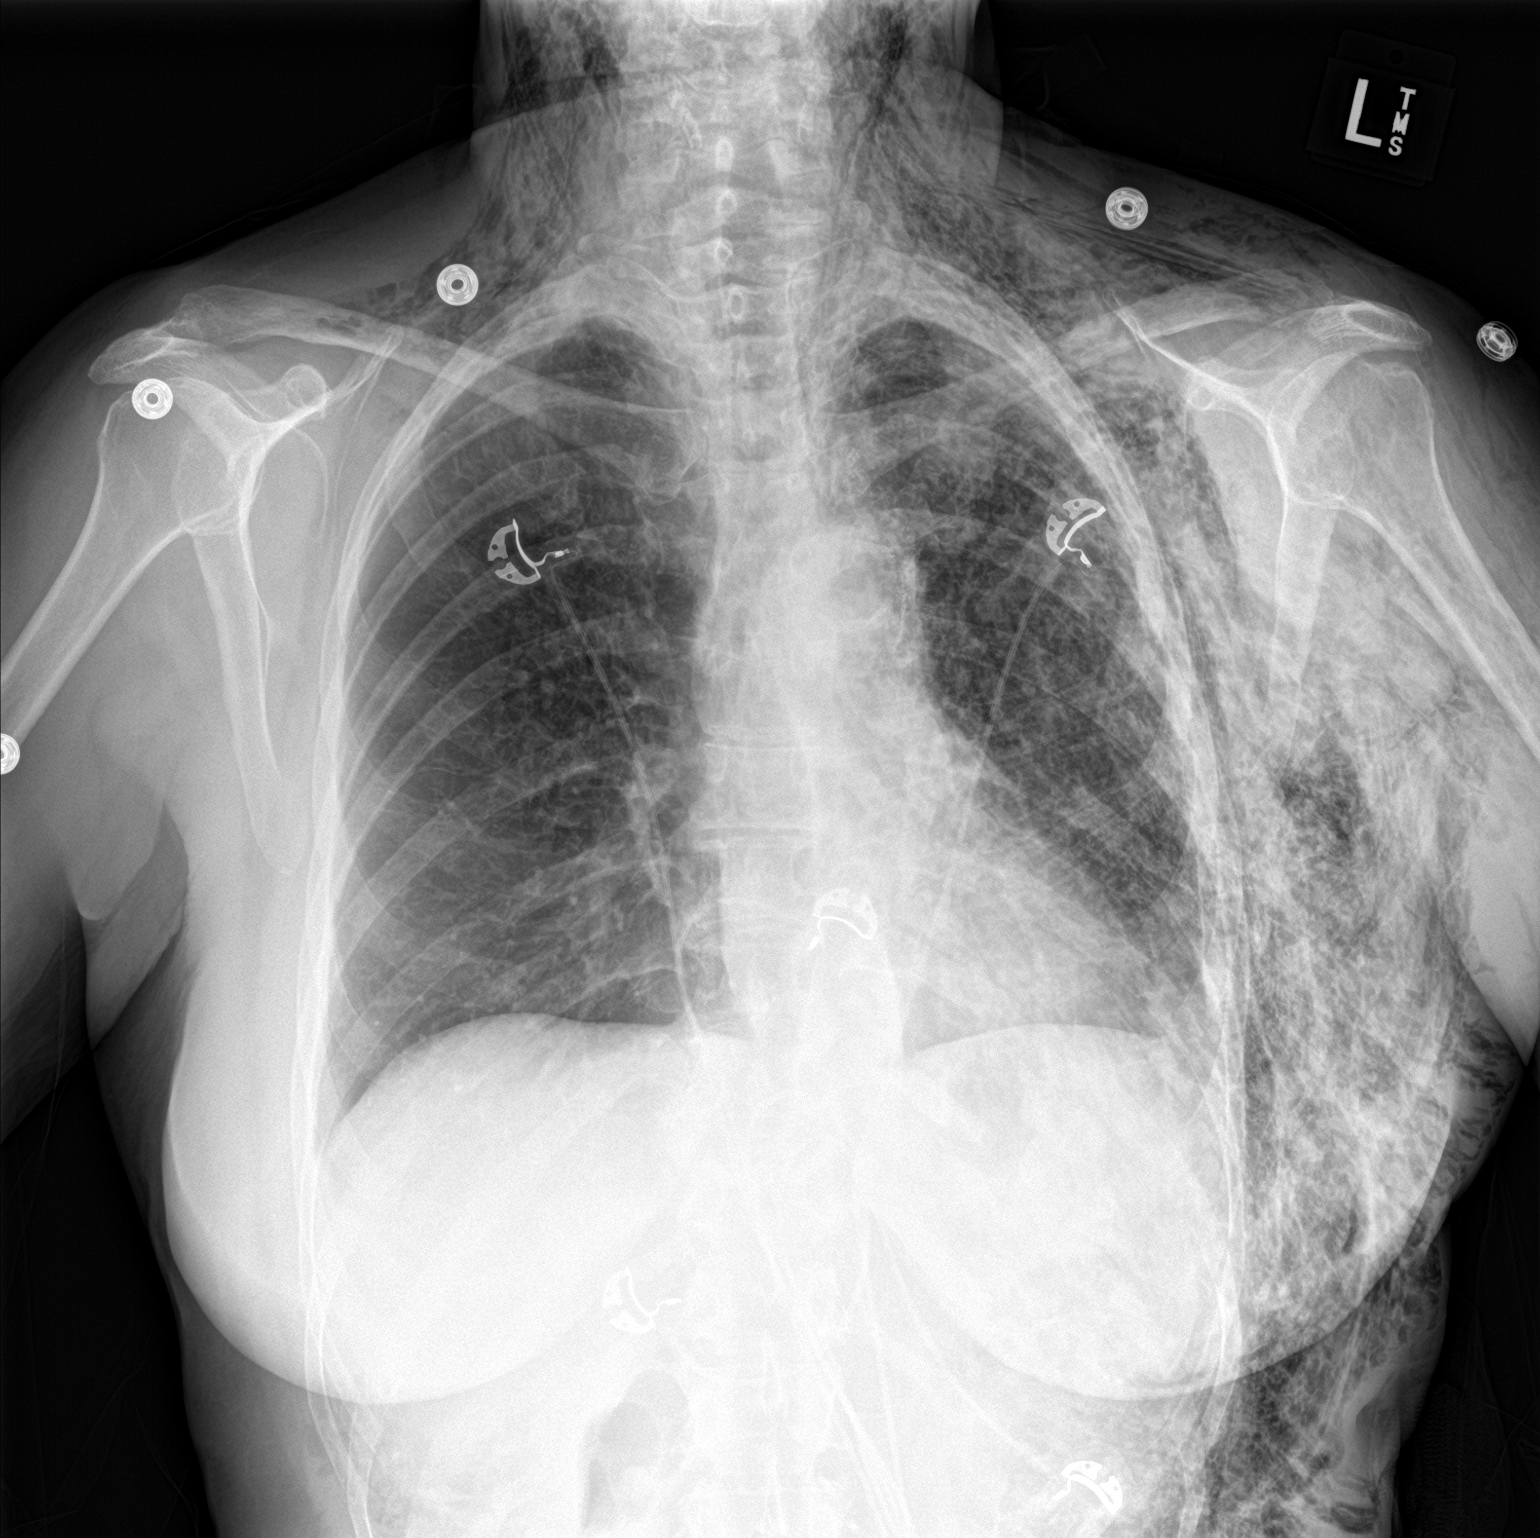

[chest lat]
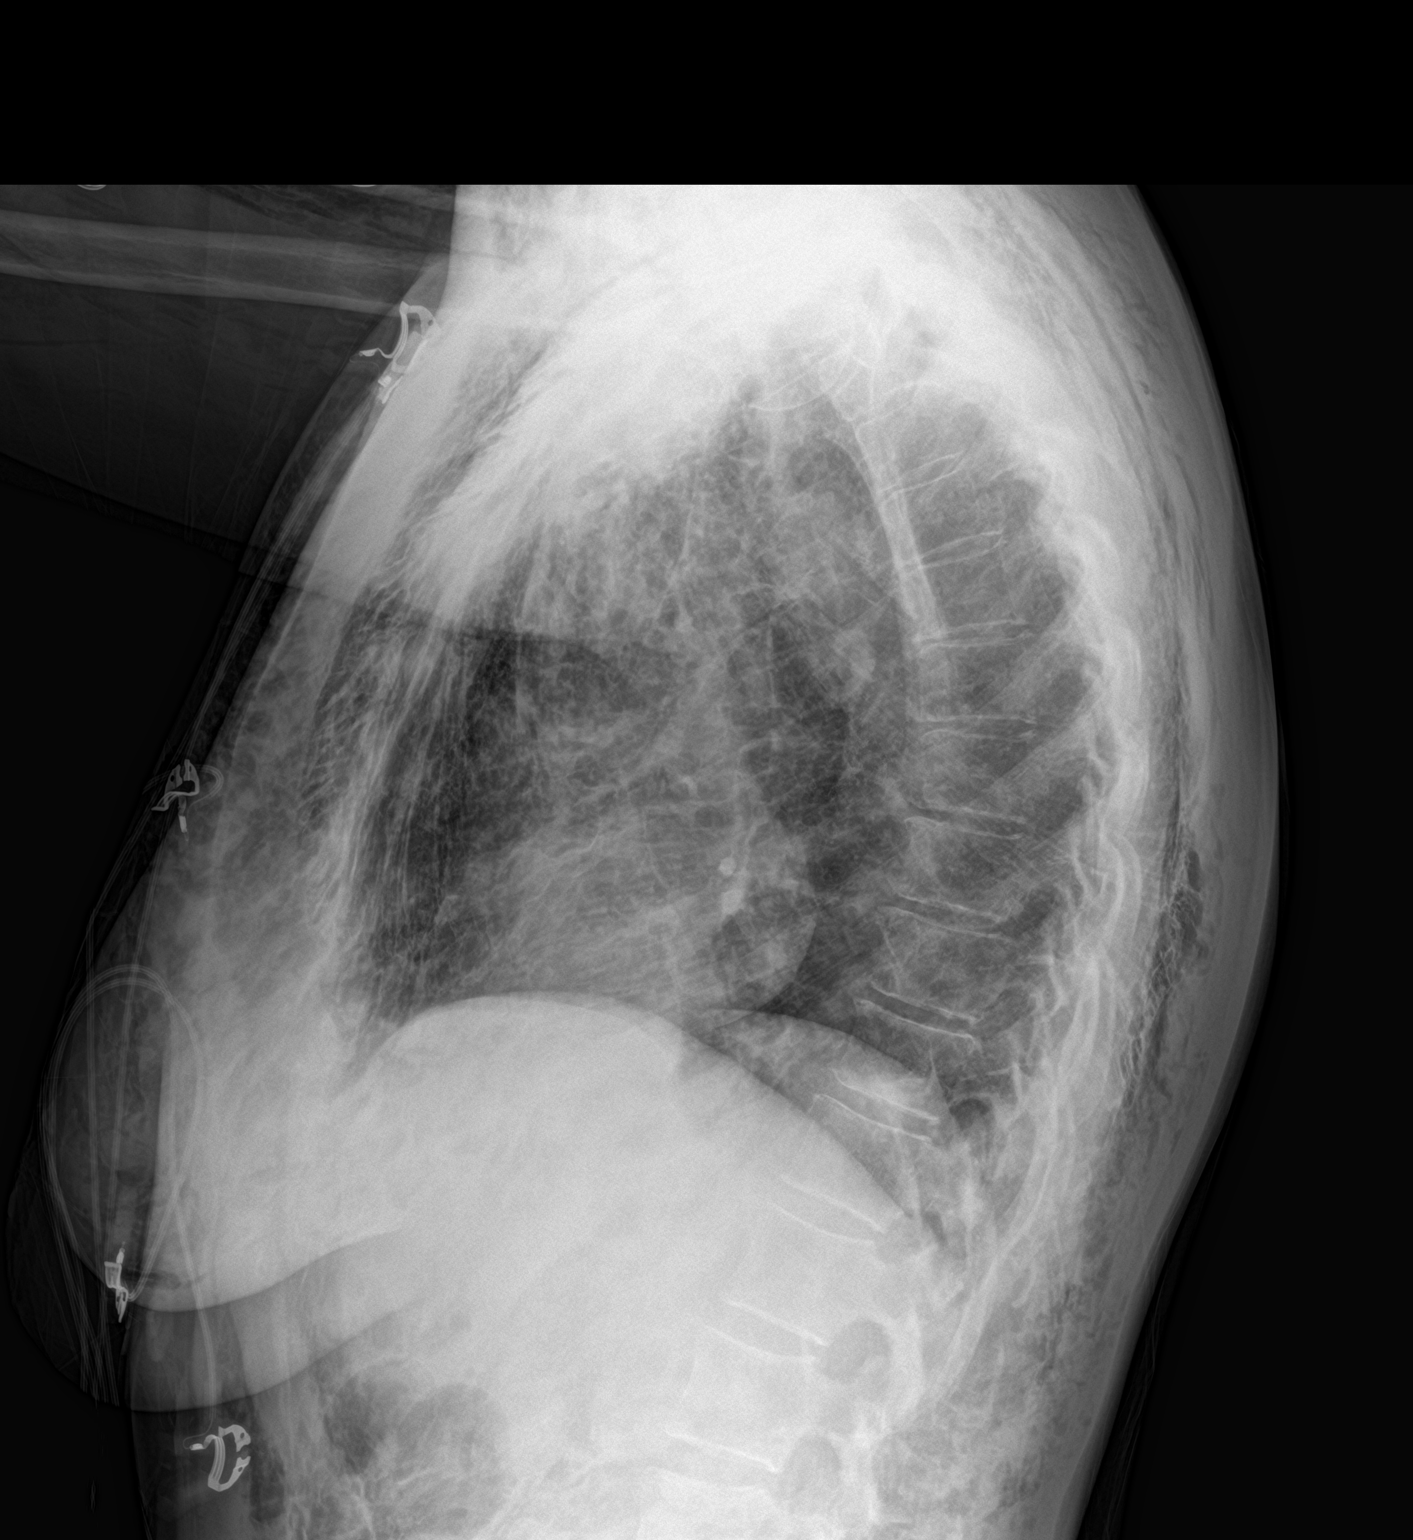

[2 of 2 positions shown; findings below may reference images not displayed]

FINDINGS: Cardiac shadow is stable. Aortic calcifications are again seen. The
right lung is clear. Left lung appears well aerated. No definitive
infiltrate is seen. Considerable subcutaneous emphysema is seen.
IMPRESSION: Considerable subcutaneous emphysema. Previously seen pneumothorax on
the left is not well appreciated on today's exam.

## 2020-12-01 ENCOUNTER — Other Ambulatory Visit (HOSPITAL_COMMUNITY): Payer: Self-pay

## 2020-12-02 ENCOUNTER — Inpatient Hospital Stay: Payer: Medicare Other

## 2020-12-02 ENCOUNTER — Other Ambulatory Visit (HOSPITAL_COMMUNITY): Payer: Self-pay

## 2020-12-02 ENCOUNTER — Other Ambulatory Visit: Payer: Self-pay

## 2020-12-02 ENCOUNTER — Inpatient Hospital Stay: Payer: Medicare Other | Attending: Internal Medicine | Admitting: Internal Medicine

## 2020-12-02 VITALS — BP 162/62 | HR 81 | Temp 98.4°F | Resp 20 | Ht 64.0 in | Wt 138.3 lb

## 2020-12-02 DIAGNOSIS — R030 Elevated blood-pressure reading, without diagnosis of hypertension: Secondary | ICD-10-CM | POA: Diagnosis not present

## 2020-12-02 DIAGNOSIS — D638 Anemia in other chronic diseases classified elsewhere: Secondary | ICD-10-CM | POA: Diagnosis not present

## 2020-12-02 DIAGNOSIS — Z79899 Other long term (current) drug therapy: Secondary | ICD-10-CM | POA: Insufficient documentation

## 2020-12-02 DIAGNOSIS — C3492 Malignant neoplasm of unspecified part of left bronchus or lung: Secondary | ICD-10-CM

## 2020-12-02 DIAGNOSIS — Z902 Acquired absence of lung [part of]: Secondary | ICD-10-CM | POA: Insufficient documentation

## 2020-12-02 DIAGNOSIS — C3490 Malignant neoplasm of unspecified part of unspecified bronchus or lung: Secondary | ICD-10-CM

## 2020-12-02 DIAGNOSIS — Z1509 Genetic susceptibility to other malignant neoplasm: Secondary | ICD-10-CM | POA: Insufficient documentation

## 2020-12-02 LAB — CBC WITH DIFFERENTIAL (CANCER CENTER ONLY)
Abs Immature Granulocytes: 0.01 10*3/uL (ref 0.00–0.07)
Basophils Absolute: 0 10*3/uL (ref 0.0–0.1)
Basophils Relative: 1 %
Eosinophils Absolute: 0.1 10*3/uL (ref 0.0–0.5)
Eosinophils Relative: 2 %
HCT: 28.8 % — ABNORMAL LOW (ref 36.0–46.0)
Hemoglobin: 9.4 g/dL — ABNORMAL LOW (ref 12.0–15.0)
Immature Granulocytes: 0 %
Lymphocytes Relative: 38 %
Lymphs Abs: 2.1 10*3/uL (ref 0.7–4.0)
MCH: 31.6 pg (ref 26.0–34.0)
MCHC: 32.6 g/dL (ref 30.0–36.0)
MCV: 97 fL (ref 80.0–100.0)
Monocytes Absolute: 0.6 10*3/uL (ref 0.1–1.0)
Monocytes Relative: 11 %
Neutro Abs: 2.7 10*3/uL (ref 1.7–7.7)
Neutrophils Relative %: 48 %
Platelet Count: 199 10*3/uL (ref 150–400)
RBC: 2.97 MIL/uL — ABNORMAL LOW (ref 3.87–5.11)
RDW: 13.1 % (ref 11.5–15.5)
WBC Count: 5.4 10*3/uL (ref 4.0–10.5)
nRBC: 0 % (ref 0.0–0.2)

## 2020-12-02 LAB — CMP (CANCER CENTER ONLY)
ALT: 15 U/L (ref 0–44)
AST: 24 U/L (ref 15–41)
Albumin: 3.7 g/dL (ref 3.5–5.0)
Alkaline Phosphatase: 56 U/L (ref 38–126)
Anion gap: 8 (ref 5–15)
BUN: 19 mg/dL (ref 8–23)
CO2: 25 mmol/L (ref 22–32)
Calcium: 8.8 mg/dL — ABNORMAL LOW (ref 8.9–10.3)
Chloride: 104 mmol/L (ref 98–111)
Creatinine: 0.85 mg/dL (ref 0.44–1.00)
GFR, Estimated: >60 mL/min (ref >60)
Glucose, Bld: 102 mg/dL — ABNORMAL HIGH (ref 70–99)
Potassium: 4.6 mmol/L (ref 3.5–5.1)
Sodium: 137 mmol/L (ref 135–145)
Total Bilirubin: 0.3 mg/dL (ref 0.3–1.2)
Total Protein: 6.3 g/dL — ABNORMAL LOW (ref 6.5–8.1)

## 2020-12-02 NOTE — Progress Notes (Signed)
[place] Telephone:([phone]   Fax:([phone]  OFFICE PROGRESS NOTE  Donald Prose, MD [place] [address]  DIAGNOSIS: Suspicious recurrent non-small cell lung cancer initially diagnosed as stage IIA (T2b, N0, M0) non-small cell lung cancer, adenocarcinoma diagnosed in November 2019  Molecular studies showed EGFR mutation in exon 21 (L858R) PD-L1 expression 0  PRIOR THERAPY: status post left lower lobectomy with lymph node dissection on April 25, 2018 under the care of Dr. Roxan Hockey.  She declined adjuvant therapy  CURRENT THERAPY: Osimertinib (Tagrisso) 80 mg p.o. daily.  First dose was on June 19, 2019.  Status post 18 months of treatment.  INTERVAL HISTORY: [name] 70 y.o. female returns to the clinic today for follow-up visit accompanied by her husband.  The patient is feeling fine today with no concerning complaints.  She had another COVID-19 infection after her last visit 7 weeks ago.  She did not receive any treatment for it and she improved after few days.  She received all her COVID-vaccine including the 2 boosters.  She denied having any current chest pain, shortness of breath, cough or hemoptysis.  She denied having any fever or chills.  She has no nausea, vomiting, diarrhea or constipation.  She has no skin rash.  She has no headache or visual changes.  She continues to tolerate her treatment with Tagrisso fairly well.  Her blood pressure is always normal at home and she has a log of her blood pressure measurements but it is usually high in the clinic.  She is here today for evaluation and repeat blood work.  She is planning to travel to [place] for few weeks to take care of her grandchildren.   MEDICAL HISTORY: Past Medical History:  Diagnosis Date   Anemia    Atherosclerosis    GERD (gastroesophageal reflux disease)    Hemoptysis    Hyperlipidemia    Hypertension    IBS (irritable bowel syndrome)    Lung  mass    Migraines    Spondylosis    Vitamin D deficiency     ALLERGIES:  is allergic to avocado, influenza vaccines, lisinopril, metoprolol, nexium [esomeprazole magnesium], penicillins, pork-derived products, proton pump inhibitors, sulfites, wasp venom protein, zantac [ranitidine hcl], cefaclor, chlorhexidine, chloroxine, ciprofloxacin hcl, clarithromycin, gatifloxacin, other, sulfa antibiotics, sulfamethoxazole-trimethoprim, eggs or egg-derived products, aspartame, chocolate, dilaudid [hydromorphone hcl], fentanyl, ivp dye [iodinated diagnostic agents], ketorolac, lactase, milk-related compounds, molds & smuts, oxycodone, prednisolone, shellfish allergy, and soap.  MEDICATIONS:  Current Outpatient Medications  Medication Sig Dispense Refill   Carboxymethylcellul-Glycerin (LUBRICATING EYE DROPS OP) Place 1 drop into both eyes daily as needed (dry eyes).     Cholecalciferol (VITAMIN D3) LIQD Take 4,000 Units by mouth daily.     diltiazem (TIAZAC) 180 MG 24 hr capsule Take 180 mg by mouth daily in the afternoon.      EPINEPHrine 0.3 mg/0.3 mL IJ SOAJ injection Inject 0.3 mg into the muscle as needed for anaphylaxis.      Ferrous Sulfate (IRON PO) Take by mouth.     Omega 3-6-9 Fatty Acids (OMEGA-3 FUSION) LIQD Take by mouth.     Omega-3 Fatty Acids (FISH OIL PO) Take 2 capsules by mouth daily.     osimertinib mesylate (TAGRISSO) 80 MG tablet TAKE 1 TABLET (80 MG TOTAL) BY MOUTH DAILY. 90 tablet 1   OVER THE COUNTER MEDICATION Triamcinolone, urea and menthol and camphor     PRESCRIPTION MEDICATION Apply  1 application topically 2 (two) times daily as needed for itching. Dr Molinda Bailiff 2019     rizatriptan (MAXALT) 10 MG tablet Take 5 mg by mouth as needed for migraine. May repeat in 2 hours if needed     vitamin B-12 (CYANOCOBALAMIN) 250 MCG tablet Take 500 mcg by mouth daily.     No current facility-administered medications for this visit.    SURGICAL HISTORY:  Past Surgical History:   Procedure Laterality Date   BROW LIFT AND BLEPHAROPLASTY  03/15/2013   CATARACT EXTRACTION, BILATERAL  06/23/2014   CHEST TUBE INSERTION Left 05/10/2018   Procedure: CHEST TUBE INSERTION;  Surgeon: [name], MD;  Location: [facility];  Service: Thoracic;  Laterality: Left;   COLONOSCOPY  05/2014   ESOPHAGOGASTRODUODENOSCOPY  2019   EYE SURGERY     IR THORACENTESIS ASP PLEURAL SPACE W/IMG GUIDE  05/24/2018   IR THORACENTESIS ASP PLEURAL SPACE W/IMG GUIDE  06/14/2018   TONSILLECTOMY     VIDEO ASSISTED THORACOSCOPY (VATS)/ LOBECTOMY Left 04/25/2018   Procedure: VIDEO ASSISTED THORACOSCOPY (VATS)LEFT LOWER LOBECTOMY, NODE DISSECTION;  Surgeon: [name], MD;  Location: [facility];  Service: Thoracic;  Laterality: Left;   VIDEO BRONCHOSCOPY WITH ENDOBRONCHIAL NAVIGATION N/A 03/07/2018   Procedure: VIDEO BRONCHOSCOPY WITH ENDOBRONCHIAL NAVIGATION;  Surgeon: Collene Gobble, MD;  [facility];  Service: Thoracic;  Laterality: N/A;   VIDEO BRONCHOSCOPY WITH INSERTION OF INTERBRONCHIAL VALVE (IBV) N/A 05/10/2018   Procedure: VIDEO BRONCHOSCOPY WITH INSERTION OF INTERBRONCHIAL VALVE (IBV);  Surgeon: [name], MD;  Location: [facility];  Service: Thoracic;  Laterality: N/A;   VIDEO BRONCHOSCOPY WITH INSERTION OF INTERBRONCHIAL VALVE (IBV) N/A 06/13/2018   Procedure: VIDEO BRONCHOSCOPY WITH REMOVAL OF INTERBRONCHIAL VALVE (IBV) x 2;  Surgeon: [name], MD;  Location: [facility];  Service: Thoracic;  Laterality: N/A;    REVIEW OF SYSTEMS:  Constitutional: positive for fatigue Eyes: negative Ears, nose, mouth, throat, and face: negative Respiratory: negative Cardiovascular: negative Gastrointestinal: negative Genitourinary:negative Integument/breast: negative Hematologic/lymphatic: negative Musculoskeletal:negative Neurological: negative Behavioral/Psych: negative Endocrine: negative Allergic/Immunologic: negative   PHYSICAL EXAMINATION: General appearance: alert,  cooperative, fatigued, and no distress Head: Normocephalic, without obvious abnormality, atraumatic Neck: no adenopathy, no JVD, supple, symmetrical, trachea midline, and thyroid not enlarged, symmetric, no tenderness/mass/nodules Lymph nodes: Cervical, supraclavicular, and axillary nodes normal. Resp: clear to auscultation bilaterally Back: symmetric, no curvature. ROM normal. No CVA tenderness. Cardio: regular rate and rhythm, S1, S2 normal, no murmur, click, rub or gallop GI: soft, non-tender; bowel sounds normal; no masses,  no organomegaly Extremities: extremities normal, atraumatic, no cyanosis or edema Neurologic: Alert and oriented X 3, normal strength and tone. Normal symmetric reflexes. Normal coordination and gait   ECOG PERFORMANCE STATUS: 1 - Symptomatic but completely ambulatory  Blood pressure (!) 162/62, pulse 81, temperature 98.4 F (36.9 C), temperature source Tympanic, resp. rate 20, height $RemoveBe'5\' 4"'OvWTHkuxS$  (1.626 m), weight 138 lb 4.8 oz (62.7 kg), SpO2 98 %.   LABORATORY DATA: Lab Results  Component Value Date   WBC 5.4 12/02/2020   HGB 9.4 (L) 12/02/2020   HCT 28.8 (L) 12/02/2020   MCV 97.0 12/02/2020   PLT 199 12/02/2020      Chemistry      Component Value Date/Time   NA 135 10/14/2020 1042   K 4.2 10/14/2020 1042   CL 100 10/14/2020 1042   CO2 26 10/14/2020 1042   BUN 12 10/14/2020 1042   CREATININE 0.81 10/14/2020 1042      Component  Value Date/Time   CALCIUM 9.3 10/14/2020 1042   ALKPHOS 57 10/14/2020 1042   AST 21 10/14/2020 1042   ALT 15 10/14/2020 1042   BILITOT 0.4 10/14/2020 1042       RADIOGRAPHIC STUDIES: No results found.  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 Roxan Hockey. The patient is currently on observation.  She declined  to proceed with adjuvant systemic chemotherapy or targeted therapy. The patient has been in observation since her surgical resection. Recent CT scan of the chest followed by PET scan showed suspicious pleural-based mass and nodularity in the left lung concerning for disease recurrence.  Her disease recurrence was confirmed with repeat biopsy of the pleural-based left lung mass. We had molecular studies on the new biopsy and it was consistent with EGFR mutation exon 21 (L858R). The patient started treatment with Tagrisso 80 mg p.o. daily status post almost 18 months of treatment.  The patient continues to tolerate her treatment with Tagrisso fairly well with no significant adverse effect except for mild fatigue related to her chronic anemia. Lab work today is unremarkable except for the anemia. I recommended for the patient to continue her current treatment with Tagrisso 80 mg p.o. daily. I will see her back for follow-up visit in around 9 weeks with repeat CT scan of the chest for restaging of her disease. For the chronic anemia, she will continue taking oral iron tablets few times a week. For the hypertension, her blood pressure is usually normal at home but she has whitecoat syndrome and it is usually elevated in the clinic.  She is followed by her primary care physician for this problem. The patient was advised to call immediately if she has any concerning symptoms in the interval.  The patient voices understanding of current disease status and treatment options and is in agreement with the current care plan. All questions were answered. The patient knows to call the clinic with any problems, questions or concerns. We can certainly see the patient much sooner if necessary.  Disclaimer: This note was dictated with voice recognition software. Similar sounding words can inadvertently be transcribed and may not be corrected upon review.

## 2020-12-10 ENCOUNTER — Other Ambulatory Visit (HOSPITAL_COMMUNITY): Payer: Self-pay

## 2020-12-29 ENCOUNTER — Other Ambulatory Visit (HOSPITAL_COMMUNITY): Payer: Self-pay

## 2020-12-30 ENCOUNTER — Other Ambulatory Visit (HOSPITAL_COMMUNITY): Payer: Self-pay

## 2021-01-07 ENCOUNTER — Other Ambulatory Visit (HOSPITAL_COMMUNITY): Payer: Self-pay

## 2021-02-02 ENCOUNTER — Other Ambulatory Visit (HOSPITAL_COMMUNITY): Payer: Self-pay

## 2021-02-02 ENCOUNTER — Other Ambulatory Visit: Payer: Self-pay | Admitting: Internal Medicine

## 2021-02-02 DIAGNOSIS — C3492 Malignant neoplasm of unspecified part of left bronchus or lung: Secondary | ICD-10-CM

## 2021-02-02 MED ORDER — OSIMERTINIB MESYLATE 80 MG PO TABS
ORAL_TABLET | Freq: Every day | ORAL | 1 refills | Status: DC
  Filled 2021-02-02: qty 30, 30d supply, fill #0
  Filled 2021-02-25: qty 30, 30d supply, fill #1
  Filled 2021-03-25: qty 30, 30d supply, fill #2
  Filled 2021-04-22: qty 30, 30d supply, fill #3
  Filled 2021-06-01: qty 30, 30d supply, fill #4
  Filled 2021-06-28: qty 30, 30d supply, fill #5

## 2021-02-03 ENCOUNTER — Inpatient Hospital Stay: Payer: Medicare Other | Attending: Internal Medicine

## 2021-02-03 ENCOUNTER — Ambulatory Visit (HOSPITAL_COMMUNITY)
Admission: RE | Admit: 2021-02-03 | Discharge: 2021-02-03 | Disposition: A | Discharge status: Home / Self Care | Payer: Medicare Other | Source: Ambulatory Visit | Attending: Internal Medicine | Admitting: Internal Medicine

## 2021-02-03 ENCOUNTER — Other Ambulatory Visit: Payer: Self-pay

## 2021-02-03 ENCOUNTER — Ambulatory Visit (INDEPENDENT_AMBULATORY_CARE_PROVIDER_SITE_OTHER): Payer: Medicare Other | Admitting: Thoracic Surgery (Cardiothoracic Vascular Surgery)

## 2021-02-03 ENCOUNTER — Inpatient Hospital Stay (HOSPITAL_BASED_OUTPATIENT_CLINIC_OR_DEPARTMENT_OTHER): Payer: Medicare Other | Admitting: Internal Medicine

## 2021-02-03 VITALS — BP 156/75 | HR 82 | Resp 20 | Ht 64.0 in | Wt 138.0 lb

## 2021-02-03 VITALS — BP 154/66 | HR 83 | Temp 97.4°F | Resp 19 | Ht 64.0 in | Wt 138.7 lb

## 2021-02-03 DIAGNOSIS — C3492 Malignant neoplasm of unspecified part of left bronchus or lung: Secondary | ICD-10-CM | POA: Diagnosis not present

## 2021-02-03 DIAGNOSIS — C3490 Malignant neoplasm of unspecified part of unspecified bronchus or lung: Secondary | ICD-10-CM

## 2021-02-03 DIAGNOSIS — Z902 Acquired absence of lung [part of]: Secondary | ICD-10-CM

## 2021-02-03 DIAGNOSIS — C3432 Malignant neoplasm of lower lobe, left bronchus or lung: Secondary | ICD-10-CM

## 2021-02-03 LAB — CBC WITH DIFFERENTIAL (CANCER CENTER ONLY)
Abs Immature Granulocytes: 0.02 10*3/uL (ref 0.00–0.07)
Basophils Absolute: 0 10*3/uL (ref 0.0–0.1)
Basophils Relative: 0 %
Eosinophils Absolute: 0.1 10*3/uL (ref 0.0–0.5)
Eosinophils Relative: 1 %
HCT: 30.9 % — ABNORMAL LOW (ref 36.0–46.0)
Hemoglobin: 10.2 g/dL — ABNORMAL LOW (ref 12.0–15.0)
Immature Granulocytes: 0 %
Lymphocytes Relative: 35 %
Lymphs Abs: 2 10*3/uL (ref 0.7–4.0)
MCH: 32 pg (ref 26.0–34.0)
MCHC: 33 g/dL (ref 30.0–36.0)
MCV: 96.9 fL (ref 80.0–100.0)
Monocytes Absolute: 0.6 10*3/uL (ref 0.1–1.0)
Monocytes Relative: 12 %
Neutro Abs: 2.9 10*3/uL (ref 1.7–7.7)
Neutrophils Relative %: 52 %
Platelet Count: 211 10*3/uL (ref 150–400)
RBC: 3.19 MIL/uL — ABNORMAL LOW (ref 3.87–5.11)
RDW: 12.7 % (ref 11.5–15.5)
WBC Count: 5.6 10*3/uL (ref 4.0–10.5)
nRBC: 0 % (ref 0.0–0.2)

## 2021-02-03 LAB — CMP (CANCER CENTER ONLY)
ALT: 15 U/L (ref 0–44)
AST: 20 U/L (ref 15–41)
Albumin: 3.9 g/dL (ref 3.5–5.0)
Alkaline Phosphatase: 55 U/L (ref 38–126)
Anion gap: 9 (ref 5–15)
BUN: 16 mg/dL (ref 8–23)
CO2: 26 mmol/L (ref 22–32)
Calcium: 9.4 mg/dL (ref 8.9–10.3)
Chloride: 102 mmol/L (ref 98–111)
Creatinine: 0.83 mg/dL (ref 0.44–1.00)
GFR, Estimated: >60 mL/min (ref >60)
Glucose, Bld: 91 mg/dL (ref 70–99)
Potassium: 4.2 mmol/L (ref 3.5–5.1)
Sodium: 137 mmol/L (ref 135–145)
Total Bilirubin: 0.3 mg/dL (ref 0.3–1.2)
Total Protein: 6.6 g/dL (ref 6.5–8.1)

## 2021-02-03 NOTE — Progress Notes (Signed)
[facility] [phone]   Fax:([phone]  OFFICE PROGRESS NOTE  Donald Prose, MD [place] [address]  DIAGNOSIS: Suspicious recurrent non-small cell lung cancer initially diagnosed as stage IIA (T2b, N0, M0) non-small cell lung cancer, adenocarcinoma diagnosed in November 2019  Molecular studies showed EGFR mutation in exon 21 (L858R) PD-L1 expression 0  PRIOR THERAPY: status post left lower lobectomy with lymph node dissection on April 25, 2018 under the care of Dr. Roxan Hockey.  She declined adjuvant therapy  CURRENT THERAPY: Osimertinib (Tagrisso) 80 mg p.o. daily.  First dose was on June 19, 2019.  Status post 19 months of treatment.  INTERVAL HISTORY: [name] 70 y.o. female returns to the clinic today for follow-up visit accompanied by her husband.  The patient is feeling fine today with no concerning complaints.  She denied having any current chest pain, shortness of breath, cough or hemoptysis.  She denied having any fever or chills.  She has no nausea, vomiting, diarrhea or constipation.  She denied having any headache or visual changes.  She has no recent weight loss or night sweats.  She continues to tolerate her treatment with Tagrisso fairly well.  The patient is here today for evaluation with repeat CT scan of the chest for restaging of her disease.   MEDICAL HISTORY: Past Medical History:  Diagnosis Date   Anemia    Atherosclerosis    GERD (gastroesophageal reflux disease)    Hemoptysis    Hyperlipidemia    Hypertension    IBS (irritable bowel syndrome)    Lung mass    Migraines    Spondylosis    Vitamin D deficiency     ALLERGIES:  is allergic to avocado, influenza vaccines, lisinopril, metoprolol, nexium [esomeprazole magnesium], penicillins, pork-derived products, proton pump inhibitors, sulfites, wasp venom protein, zantac [ranitidine hcl], cefaclor, chlorhexidine, chloroxine, ciprofloxacin hcl,  clarithromycin, gatifloxacin, other, sulfa antibiotics, sulfamethoxazole-trimethoprim, eggs or egg-derived products, aspartame, chocolate, dilaudid [hydromorphone hcl], fentanyl, ivp dye [iodinated diagnostic agents], ketorolac, milk-related compounds, molds & smuts, oxycodone, prednisolone, shellfish allergy, soap, and tilactase.  MEDICATIONS:  Current Outpatient Medications  Medication Sig Dispense Refill   Carboxymethylcellul-Glycerin (LUBRICATING EYE DROPS OP) Place 1 drop into both eyes daily as needed (dry eyes).     Cholecalciferol (VITAMIN D3) LIQD Take 4,000 Units by mouth daily.     COVID-19 mRNA vaccine, Pfizer, 30 MCG/0.3ML injection Inject into the muscle.     diltiazem (TIAZAC) 120 MG 24 hr capsule Take 120 mg by mouth daily.     Omega 3-6-9 Fatty Acids (OMEGA-3 FUSION) LIQD Take by mouth.     osimertinib mesylate (TAGRISSO) 80 MG tablet TAKE 1 TABLET (80 MG TOTAL) BY MOUTH DAILY. 90 tablet 1   OVER THE COUNTER MEDICATION Triamcinolone, urea and menthol and camphor     OVER THE COUNTER MEDICATION Iron gummies.     PRESCRIPTION MEDICATION Apply 1 application topically 2 (two) times daily as needed for itching. Dr Molinda Bailiff 2019     EPINEPHrine 0.3 mg/0.3 mL IJ SOAJ injection Inject 0.3 mg into the muscle as needed for anaphylaxis.  (Patient not taking: Reported on 02/03/2021)     rizatriptan (MAXALT) 10 MG tablet Take 5 mg by mouth as needed for migraine. May repeat in 2 hours if needed (Patient not taking: Reported on 02/03/2021)     No current facility-administered medications for this visit.    SURGICAL HISTORY:  Past Surgical History:  Procedure Laterality  Date   BROW LIFT AND BLEPHAROPLASTY  03/15/2013   CATARACT EXTRACTION, BILATERAL  06/23/2014   CHEST TUBE INSERTION Left 05/10/2018   Procedure: CHEST TUBE INSERTION;  Surgeon: [name], MD;  Location: [facility];  Service: Thoracic;  Laterality: Left;   COLONOSCOPY  05/2014   ESOPHAGOGASTRODUODENOSCOPY  2019    EYE SURGERY     IR THORACENTESIS ASP PLEURAL SPACE W/IMG GUIDE  05/24/2018   IR THORACENTESIS ASP PLEURAL SPACE W/IMG GUIDE  06/14/2018   TONSILLECTOMY     VIDEO ASSISTED THORACOSCOPY (VATS)/ LOBECTOMY Left 04/25/2018   Procedure: VIDEO ASSISTED THORACOSCOPY (VATS)LEFT LOWER LOBECTOMY, NODE DISSECTION;  Surgeon: [name], MD;  Location: [facility];  Service: Thoracic;  Laterality: Left;   VIDEO BRONCHOSCOPY WITH ENDOBRONCHIAL NAVIGATION N/A 03/07/2018   Procedure: VIDEO BRONCHOSCOPY WITH ENDOBRONCHIAL NAVIGATION;  Surgeon: Collene Gobble, MD;  Location[facility];  Service: Thoracic;  Laterality: N/A;   VIDEO BRONCHOSCOPY WITH INSERTION OF INTERBRONCHIAL VALVE (IBV) N/A 05/10/2018   Procedure: VIDEO BRONCHOSCOPY WITH INSERTION OF INTERBRONCHIAL VALVE (IBV);  Surgeon: [name], MD;  Location: [facility] OR;  Service: Thoracic;  Laterality: N/A;   VIDEO BRONCHOSCOPY WITH INSERTION OF INTERBRONCHIAL VALVE (IBV) N/A 06/13/2018   Procedure: VIDEO BRONCHOSCOPY WITH REMOVAL OF INTERBRONCHIAL VALVE (IBV) x 2;  Surgeon: [name], MD;  Location: [facility];  Service: Thoracic;  Laterality: N/A;    REVIEW OF SYSTEMS:  Constitutional: negative Eyes: negative Ears, nose, mouth, throat, and face: negative Respiratory: negative Cardiovascular: negative Gastrointestinal: negative Genitourinary:negative Integument/breast: negative Hematologic/lymphatic: negative Musculoskeletal:negative Neurological: negative Behavioral/Psych: negative Endocrine: negative Allergic/Immunologic: negative   PHYSICAL EXAMINATION: General appearance: alert, cooperative, and no distress Head: Normocephalic, without obvious abnormality, atraumatic Neck: no adenopathy, no JVD, supple, symmetrical, trachea midline, and thyroid not enlarged, symmetric, no tenderness/mass/nodules Lymph nodes: Cervical, supraclavicular, and axillary nodes normal. Resp: clear to auscultation bilaterally Back: symmetric, no curvature.  ROM normal. No CVA tenderness. Cardio: regular rate and rhythm, S1, S2 normal, no murmur, click, rub or gallop GI: soft, non-tender; bowel sounds normal; no masses,  no organomegaly Extremities: extremities normal, atraumatic, no cyanosis or edema Neurologic: Alert and oriented X 3, normal strength and tone. Normal symmetric reflexes. Normal coordination and gait   ECOG PERFORMANCE STATUS: 1 - Symptomatic but completely ambulatory  Blood pressure (!) 154/66, pulse 83, temperature (!) 97.4 F (36.3 C), temperature source Tympanic, resp. rate 19, height $RemoveBe'5\' 4"'dptlcnOWl$  (1.626 m), weight 138 lb 11.2 oz (62.9 kg), SpO2 100 %.   LABORATORY DATA: Lab Results  Component Value Date   WBC 5.6 02/03/2021   HGB 10.2 (L) 02/03/2021   HCT 30.9 (L) 02/03/2021   MCV 96.9 02/03/2021   PLT 211 02/03/2021      Chemistry      Component Value Date/Time   NA 137 02/03/2021 1016   K 4.2 02/03/2021 1016   CL 102 02/03/2021 1016   CO2 26 02/03/2021 1016   BUN 16 02/03/2021 1016   CREATININE 0.83 02/03/2021 1016      Component Value Date/Time   CALCIUM 9.4 02/03/2021 1016   ALKPHOS 55 02/03/2021 1016   AST 20 02/03/2021 1016   ALT 15 02/03/2021 1016   BILITOT 0.3 02/03/2021 1016       RADIOGRAPHIC STUDIES: CT Chest Wo Contrast  Result Date: 02/03/2021 CLINICAL DATA:  Non-small cell lung cancer. EXAM: CT CHEST WITHOUT CONTRAST TECHNIQUE: Multidetector CT imaging of the chest was performed following the standard protocol without IV contrast. COMPARISON:  10/14/2020 and  CT abdomen pelvis 06/23/2020. FINDINGS: Cardiovascular: Atherosclerotic calcification of the aorta, aortic valve and coronary arteries. Pulmonic trunk is enlarged. Heart size normal. No pericardial effusion. Mediastinum/Nodes: No pathologically enlarged mediastinal or axillary lymph nodes. High right paratracheal lymph node measures 8 mm, stable. Hilar regions are difficult to definitively evaluate without IV contrast. Esophagus is  unremarkable. Small hiatal hernia. Lungs/Pleura: Minimal linear scarring in the right lower lobe. Left lower lobectomy with additional postoperative scarring in the left upper lobe, including minimal subpleural soft tissue thickening in the medial left hemithorax (2/48 and 102), stable. Left lung is otherwise clear. No pleural fluid. Airway is otherwise unremarkable. Upper Abdomen: Subcentimeter low-attenuation lesion in the dome of the liver, too small to characterize. Visualized portions of the liver, gallbladder, adrenal glands and right kidney are unremarkable. Subcentimeter low-attenuation lesion left kidney, too small to characterize. Visualized portions of the spleen, pancreas, stomach and bowel are grossly unremarkable. No upper abdominal adenopathy. Musculoskeletal: Degenerative changes in the spine. Probable bone island in the right sixth posterolateral rib, unchanged. No worrisome lytic or sclerotic lesions. IMPRESSION: 1. No evidence of recurrent or metastatic disease. Subtle areas of subpleural soft tissue thickening in the medial left hemithorax are unchanged. 2. Aortic atherosclerosis (ICD10-I70.0). Coronary artery calcification. 3. Enlarged pulmonic trunk, indicative of pulmonary arterial hypertension. Electronically Signed   By: Lorin Picket M.D.   On: 02/03/2021 12:04    ASSESSMENT AND PLAN: This is a very pleasant 70 years old white female with a stage IIa non-small cell lung cancer, adenocarcinoma with positive EGFR mutation in exon 21 (L858R) status post left lower lobectomy with lymph node dissection under the care of Dr. Roxan Hockey. The patient is currently on observation.  She declined to proceed with adjuvant systemic chemotherapy or targeted therapy. The patient has been in observation since her surgical resection. Recent CT scan of the chest followed by PET scan showed suspicious pleural-based mass and nodularity in the left lung concerning for disease recurrence.  Her disease  recurrence was confirmed with repeat biopsy of the pleural-based left lung mass. We had molecular studies on the new biopsy and it was consistent with EGFR mutation exon 21 (L858R). The patient started treatment with Tagrisso 80 mg p.o. daily status post almost 19 months of treatment.  The patient has been tolerating her treatment with Tagrisso fairly well with no significant adverse effects. She had repeat CT scan of the chest performed earlier today.  I personally and independently reviewed the scan and discussed the results with the patient and her husband. Her scan showed no concerning findings for disease progression. I recommended for her to continue her current treatment with Tagrisso with the same dose. I will see her back for follow-up visit in 2 months for evaluation and repeat blood work. For the anemia of chronic disease, she is currently on oral iron tablets and tolerating it fairly well. For the hypertension she will continue to monitor it closely at home and call her primary care physician for adjustment of her medication if needed. The patient was advised to call immediately if she has any other concerning symptoms in the interval.  The patient voices understanding of current disease status and treatment options and is in agreement with the current care plan. All questions were answered. The patient knows to call the clinic with any problems, questions or concerns. We can certainly see the patient much sooner if necessary.  Disclaimer: This note was dictated with voice recognition software. Similar sounding words can inadvertently be transcribed  and may not be corrected upon review.

## 2021-02-03 NOTE — Progress Notes (Signed)
[address]       [place] [address]             [phone]      HPI: 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  She had a thoracoscopic left lower lobectomy in January 2020.  She was stage IIa.  Her postoperative course was complicated by prolonged air leak.  She elected not to have chemotherapy or targeted therapy.  She was diagnosed with recurrence in February 2021 with a needle biopsy.  She had a pleural-based mass with a pleural effusion.  She was EGFR positive.  She currently is on Tagrisso.  She is tolerating that.  She has a lot of side effects but she is able to manage those.  She has had a good response with Tagrisso with no evidence of progression of disease.  She saw Dr. [name] earlier.  She is concerned about her CT report showing a subcentimeter hypodense nodule in the upper part of the liver.  That had not been mentioned on previous reports.  Past Medical History:  Diagnosis Date   Anemia    Atherosclerosis    GERD (gastroesophageal reflux disease)    Hemoptysis    Hyperlipidemia    Hypertension    IBS (irritable bowel syndrome)    Lung mass    Migraines    Spondylosis    Vitamin D deficiency     Current Outpatient Medications  Medication Sig Dispense Refill   Carboxymethylcellul-Glycerin (LUBRICATING EYE DROPS OP) Place 1 drop into both eyes daily as needed (dry eyes).     Cholecalciferol (VITAMIN D3) LIQD Take 4,000 Units by mouth daily.     COVID-19 mRNA vaccine, Pfizer, 30 MCG/0.3ML injection Inject into the muscle.     diltiazem (TIAZAC) 120 MG 24 hr capsule Take 120 mg by mouth daily.     EPINEPHrine 0.3 mg/0.3 mL IJ SOAJ injection Inject 0.3 mg into the muscle as needed for anaphylaxis.     Omega 3-6-9  Fatty Acids (OMEGA-3 FUSION) LIQD Take by mouth.     osimertinib mesylate (TAGRISSO) 80 MG tablet TAKE 1 TABLET (80 MG TOTAL) BY MOUTH DAILY. 90 tablet 1   OVER THE COUNTER MEDICATION Triamcinolone, urea and menthol and camphor     OVER THE COUNTER MEDICATION Iron gummies.     PRESCRIPTION MEDICATION Apply 1 application topically 2 (two) times daily as needed for itching. Dr Molinda Bailiff 2019     rizatriptan (MAXALT) 10 MG tablet Take 5 mg by mouth as needed for migraine. May repeat in 2 hours if needed     No current facility-administered medications for this visit.    Physical Exam BP (!) 156/75   Pulse 82   Resp 20   Ht _0  (1.626 m)   Wt 138 lb (62.6 kg)   BMI 23.69 kg/m  Well-appearing 70 year old woman in no acute distress Alert and oriented x3 with no focal deficits  Diagnostic Tests: CT CHEST WITHOUT CONTRAST   TECHNIQUE: Multidetector CT imaging of the chest was performed following the standard protocol without IV contrast.   COMPARISON:  10/14/2020 and CT abdomen pelvis 06/23/2020.   FINDINGS: Cardiovascular: Atherosclerotic calcification of the aorta, aortic valve and coronary arteries. Pulmonic trunk is enlarged. Heart size normal. No pericardial effusion.   Mediastinum/Nodes:  No pathologically enlarged mediastinal or axillary lymph nodes. High right paratracheal lymph node measures 8 mm, stable. Hilar regions are difficult to definitively evaluate without IV contrast. Esophagus is unremarkable. Small hiatal hernia.   Lungs/Pleura: Minimal linear scarring in the right lower lobe. Left lower lobectomy with additional postoperative scarring in the left upper lobe, including minimal subpleural soft tissue thickening in the medial left hemithorax (2/48 and 102), stable. Left lung is otherwise clear. No pleural fluid. Airway is otherwise unremarkable.   Upper Abdomen: Subcentimeter low-attenuation lesion in the dome of the liver, too small to characterize.  Visualized portions of the liver, gallbladder, adrenal glands and right kidney are unremarkable. Subcentimeter low-attenuation lesion left kidney, too small to characterize. Visualized portions of the spleen, pancreas, stomach and bowel are grossly unremarkable. No upper abdominal adenopathy.   Musculoskeletal: Degenerative changes in the spine. Probable bone island in the right sixth posterolateral rib, unchanged. No worrisome lytic or sclerotic lesions.   IMPRESSION: 1. No evidence of recurrent or metastatic disease. Subtle areas of subpleural soft tissue thickening in the medial left hemithorax are unchanged. 2. Aortic atherosclerosis (ICD10-I70.0). Coronary artery calcification. 3. Enlarged pulmonic trunk, indicative of pulmonary arterial hypertension.     Electronically Signed   By: Lorin Picket M.D.   On: 02/03/2021 12:04 I personally reviewed the CT images.  The only low-attenuation lesion I seen in the dome of the liver has been present on prior scans but was not mention.  I did caution her that I am not 100% sure that is the area of this refer to in the report.  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  She had a thoracoscopic left lower lobectomy in January 2020.  She had stage IIa disease but opted not to have additional therapy at that time.  She was diagnosed with a recurrence in February 2021.  She has been on Tagrisso since that time.  I reviewed the CT scan with Mrs. Fullwood and her husband.  I do not see any cause for significant concern presently.  Agree with Dr. Worthy Flank plan to repeat a CT in 4 months.  Meantime she is doing exceptionally well all things considered.  She and her husband are traveling.  She really only notices pain when she stands up she sometimes experiences  a cramping pain along the left costal margin, which will resolve after a short period of time.  Plan: Follow-up as scheduled with Dr. [name] I will see her back in 4 months after she sees him.  I spent 15 minutes in review of records, images, and in consultation with Mrs. Saal today. [name], MD [facility] [phone]

## 2021-02-25 ENCOUNTER — Other Ambulatory Visit (HOSPITAL_COMMUNITY): Payer: Self-pay

## 2021-03-02 ENCOUNTER — Other Ambulatory Visit (HOSPITAL_COMMUNITY): Payer: Self-pay

## 2021-03-25 ENCOUNTER — Other Ambulatory Visit (HOSPITAL_COMMUNITY): Payer: Self-pay

## 2021-03-29 ENCOUNTER — Other Ambulatory Visit (HOSPITAL_COMMUNITY): Payer: Self-pay

## 2021-04-06 ENCOUNTER — Encounter: Payer: Self-pay | Admitting: Internal Medicine

## 2021-04-06 ENCOUNTER — Inpatient Hospital Stay (HOSPITAL_BASED_OUTPATIENT_CLINIC_OR_DEPARTMENT_OTHER): Payer: Medicare Other | Admitting: Internal Medicine

## 2021-04-06 ENCOUNTER — Inpatient Hospital Stay: Payer: Medicare Other | Attending: Internal Medicine

## 2021-04-06 ENCOUNTER — Other Ambulatory Visit: Payer: Self-pay

## 2021-04-06 VITALS — BP 157/55 | HR 79 | Temp 97.7°F | Resp 18 | Ht 64.0 in | Wt 136.3 lb

## 2021-04-06 DIAGNOSIS — K219 Gastro-esophageal reflux disease without esophagitis: Secondary | ICD-10-CM | POA: Diagnosis not present

## 2021-04-06 DIAGNOSIS — C3492 Malignant neoplasm of unspecified part of left bronchus or lung: Secondary | ICD-10-CM

## 2021-04-06 DIAGNOSIS — Z902 Acquired absence of lung [part of]: Secondary | ICD-10-CM | POA: Diagnosis not present

## 2021-04-06 DIAGNOSIS — D638 Anemia in other chronic diseases classified elsewhere: Secondary | ICD-10-CM | POA: Diagnosis not present

## 2021-04-06 DIAGNOSIS — Z1509 Genetic susceptibility to other malignant neoplasm: Secondary | ICD-10-CM | POA: Diagnosis not present

## 2021-04-06 DIAGNOSIS — Z79899 Other long term (current) drug therapy: Secondary | ICD-10-CM | POA: Insufficient documentation

## 2021-04-06 DIAGNOSIS — C3490 Malignant neoplasm of unspecified part of unspecified bronchus or lung: Secondary | ICD-10-CM

## 2021-04-06 DIAGNOSIS — Z5111 Encounter for antineoplastic chemotherapy: Secondary | ICD-10-CM

## 2021-04-06 LAB — CMP (CANCER CENTER ONLY)
ALT: 12 U/L (ref 0–44)
AST: 19 U/L (ref 15–41)
Albumin: 4.3 g/dL (ref 3.5–5.0)
Alkaline Phosphatase: 47 U/L (ref 38–126)
Anion gap: 6 (ref 5–15)
BUN: 23 mg/dL (ref 8–23)
CO2: 28 mmol/L (ref 22–32)
Calcium: 9.3 mg/dL (ref 8.9–10.3)
Chloride: 103 mmol/L (ref 98–111)
Creatinine: 0.92 mg/dL (ref 0.44–1.00)
GFR, Estimated: >60 mL/min (ref >60)
Glucose, Bld: 95 mg/dL (ref 70–99)
Potassium: 4.1 mmol/L (ref 3.5–5.1)
Sodium: 137 mmol/L (ref 135–145)
Total Bilirubin: 0.4 mg/dL (ref 0.3–1.2)
Total Protein: 6.6 g/dL (ref 6.5–8.1)

## 2021-04-06 LAB — CBC WITH DIFFERENTIAL (CANCER CENTER ONLY)
Abs Immature Granulocytes: 0.01 10*3/uL (ref 0.00–0.07)
Basophils Absolute: 0 10*3/uL (ref 0.0–0.1)
Basophils Relative: 0 %
Eosinophils Absolute: 0.1 10*3/uL (ref 0.0–0.5)
Eosinophils Relative: 2 %
HCT: 29.9 % — ABNORMAL LOW (ref 36.0–46.0)
Hemoglobin: 9.7 g/dL — ABNORMAL LOW (ref 12.0–15.0)
Immature Granulocytes: 0 %
Lymphocytes Relative: 33 %
Lymphs Abs: 1.8 10*3/uL (ref 0.7–4.0)
MCH: 31.4 pg (ref 26.0–34.0)
MCHC: 32.4 g/dL (ref 30.0–36.0)
MCV: 96.8 fL (ref 80.0–100.0)
Monocytes Absolute: 0.5 10*3/uL (ref 0.1–1.0)
Monocytes Relative: 9 %
Neutro Abs: 2.9 10*3/uL (ref 1.7–7.7)
Neutrophils Relative %: 56 %
Platelet Count: 190 10*3/uL (ref 150–400)
RBC: 3.09 MIL/uL — ABNORMAL LOW (ref 3.87–5.11)
RDW: 12.8 % (ref 11.5–15.5)
WBC Count: 5.3 10*3/uL (ref 4.0–10.5)
nRBC: 0 % (ref 0.0–0.2)

## 2021-04-06 NOTE — Progress Notes (Signed)
[facility] Telephone:([phone]   Fax:([phone]) [phone]  OFFICE PROGRESS NOTE  Donald Prose, MD [place] [address]  DIAGNOSIS: Suspicious recurrent non-small cell lung cancer initially diagnosed as stage IIA (T2b, N0, M0) non-small cell lung cancer, adenocarcinoma diagnosed in November 2019  Molecular studies showed EGFR mutation in exon 21 (L858R) PD-L1 expression 0  PRIOR THERAPY: status post left lower lobectomy with lymph node dissection on April 25, 2018 under the care of Dr. Roxan Hockey.  She declined adjuvant therapy  CURRENT THERAPY: Osimertinib (Tagrisso) 80 mg p.o. daily.  First dose was on June 19, 2019.  Status post 21 months of treatment.  INTERVAL HISTORY: [name] 70 y.o. female returns to the clinic today for follow-up visit accompanied by her husband.  The patient is feeling fine today with no concerning complaints except for acid reflux.  She has been using Pepto-Bismol once or twice a week on as-needed basis.  She is not taking any proton pump inhibitor because of concern about drug interaction.  She has no current chest pain, shortness of breath, cough or hemoptysis.  She denied having any fever or chills.  She has no nausea, vomiting, diarrhea or constipation.  She has no headache or visual changes.  She has no recent weight loss or night sweats.  She continues to tolerate her treatment with Tagrisso fairly well.  The patient is here today for evaluation and repeat blood work.   MEDICAL HISTORY: Past Medical History:  Diagnosis Date   Anemia    Atherosclerosis    GERD (gastroesophageal reflux disease)    Hemoptysis    Hyperlipidemia    Hypertension    IBS (irritable bowel syndrome)    Lung mass    Migraines    Spondylosis    Vitamin D deficiency     ALLERGIES:  is allergic to avocado, influenza vaccines, lisinopril, metoprolol, nexium [esomeprazole magnesium], penicillins, pork-derived products, proton pump  inhibitors, sulfites, wasp venom protein, zantac [ranitidine hcl], cefaclor, chlorhexidine, chloroxine, ciprofloxacin hcl, clarithromycin, gatifloxacin, other, sulfa antibiotics, sulfamethoxazole-trimethoprim, eggs or egg-derived products, aspartame, chocolate, dilaudid [hydromorphone hcl], fentanyl, ivp dye [iodinated diagnostic agents], ketorolac, milk-related compounds, molds & smuts, oxycodone, prednisolone, shellfish allergy, soap, and tilactase.  MEDICATIONS:  Current Outpatient Medications  Medication Sig Dispense Refill   Carboxymethylcellul-Glycerin (LUBRICATING EYE DROPS OP) Place 1 drop into both eyes daily as needed (dry eyes).     Cholecalciferol (VITAMIN D3) LIQD Take 4,000 Units by mouth daily.     COVID-19 mRNA vaccine, Pfizer, 30 MCG/0.3ML injection Inject into the muscle.     diltiazem (TIAZAC) 120 MG 24 hr capsule Take 120 mg by mouth daily.     EPINEPHrine 0.3 mg/0.3 mL IJ SOAJ injection Inject 0.3 mg into the muscle as needed for anaphylaxis.     Omega 3-6-9 Fatty Acids (OMEGA-3 FUSION) LIQD Take by mouth.     osimertinib mesylate (TAGRISSO) 80 MG tablet TAKE 1 TABLET (80 MG TOTAL) BY MOUTH DAILY. 90 tablet 1   OVER THE COUNTER MEDICATION Triamcinolone, urea and menthol and camphor     OVER THE COUNTER MEDICATION Iron gummies.     PRESCRIPTION MEDICATION Apply 1 application topically 2 (two) times daily as needed for itching. Dr Molinda Bailiff 2019     rizatriptan (MAXALT) 10 MG tablet Take 5 mg by mouth as needed for migraine. May repeat in 2 hours if needed     No current facility-administered medications for this visit.  SURGICAL HISTORY:  Past Surgical History:  Procedure Laterality Date   BROW LIFT AND BLEPHAROPLASTY  03/15/2013   CATARACT EXTRACTION, BILATERAL  06/23/2014   CHEST TUBE INSERTION Left 05/10/2018   Procedure: CHEST TUBE INSERTION;  Surgeon: [name], MD;  Location: [facility];  Service: Thoracic;  Laterality: Left;   COLONOSCOPY  05/2014    ESOPHAGOGASTRODUODENOSCOPY  2019   EYE SURGERY     IR THORACENTESIS ASP PLEURAL SPACE W/IMG GUIDE  05/24/2018   IR THORACENTESIS ASP PLEURAL SPACE W/IMG GUIDE  06/14/2018   TONSILLECTOMY     VIDEO ASSISTED THORACOSCOPY (VATS)/ LOBECTOMY Left 04/25/2018   Procedure: VIDEO ASSISTED THORACOSCOPY (VATS)LEFT LOWER LOBECTOMY, NODE DISSECTION;  Surgeon: [name], MD;  Location: [facility];  Service: Thoracic;  Laterality: Left;   VIDEO BRONCHOSCOPY WITH ENDOBRONCHIAL NAVIGATION N/A 03/07/2018   Procedure: VIDEO BRONCHOSCOPY WITH ENDOBRONCHIAL NAVIGATION;  Surgeon: Collene Gobble, MD;  Location: [facility] OR;  Service: Thoracic;  Laterality: N/A;   VIDEO BRONCHOSCOPY WITH INSERTION OF INTERBRONCHIAL VALVE (IBV) N/A 05/10/2018   Procedure: VIDEO BRONCHOSCOPY WITH INSERTION OF INTERBRONCHIAL VALVE (IBV);  Surgeon: [name], MD;  [facility];  Service: Thoracic;  Laterality: N/A;   VIDEO BRONCHOSCOPY WITH INSERTION OF INTERBRONCHIAL VALVE (IBV) N/A 06/13/2018   Procedure: VIDEO BRONCHOSCOPY WITH REMOVAL OF INTERBRONCHIAL VALVE (IBV) x 2;  Surgeon: [name], MD;  Location: [facility];  Service: Thoracic;  Laterality: N/A;    REVIEW OF SYSTEMS:  Constitutional: positive for fatigue Eyes: negative Ears, nose, mouth, throat, and face: negative Respiratory: negative Cardiovascular: negative Gastrointestinal: positive for dyspepsia Genitourinary:negative Integument/breast: negative Hematologic/lymphatic: negative Musculoskeletal:negative Neurological: negative Behavioral/Psych: negative Endocrine: negative Allergic/Immunologic: negative   PHYSICAL EXAMINATION: General appearance: alert, cooperative, fatigued, and no distress Head: Normocephalic, without obvious abnormality, atraumatic Neck: no adenopathy, no JVD, supple, symmetrical, trachea midline, and thyroid not enlarged, symmetric, no tenderness/mass/nodules Lymph nodes: Cervical, supraclavicular, and axillary nodes  normal. Resp: clear to auscultation bilaterally Back: symmetric, no curvature. ROM normal. No CVA tenderness. Cardio: regular rate and rhythm, S1, S2 normal, no murmur, click, rub or gallop GI: soft, non-tender; bowel sounds normal; no masses,  no organomegaly Extremities: extremities normal, atraumatic, no cyanosis or edema Neurologic: Alert and oriented X 3, normal strength and tone. Normal symmetric reflexes. Normal coordination and gait   ECOG PERFORMANCE STATUS: 1 - Symptomatic but completely ambulatory  Blood pressure (!) 157/55, pulse 79, temperature 97.7 F (36.5 C), temperature source Tympanic, resp. rate 18, height $RemoveBe'5\' 4"'tBpiSAqVp$  (1.626 m), weight 136 lb 4.8 oz (61.8 kg), SpO2 99 %.   LABORATORY DATA: Lab Results  Component Value Date   WBC 5.3 04/06/2021   HGB 9.7 (L) 04/06/2021   HCT 29.9 (L) 04/06/2021   MCV 96.8 04/06/2021   PLT 190 04/06/2021      Chemistry      Component Value Date/Time   NA 137 02/03/2021 1016   K 4.2 02/03/2021 1016   CL 102 02/03/2021 1016   CO2 26 02/03/2021 1016   BUN 16 02/03/2021 1016   CREATININE 0.83 02/03/2021 1016      Component Value Date/Time   CALCIUM 9.4 02/03/2021 1016   ALKPHOS 55 02/03/2021 1016   AST 20 02/03/2021 1016   ALT 15 02/03/2021 1016   BILITOT 0.3 02/03/2021 1016       RADIOGRAPHIC STUDIES: No results found.  ASSESSMENT AND PLAN: This is a very pleasant 70 years old white female with a stage IIa non-small cell lung cancer, adenocarcinoma with  positive EGFR mutation in exon 21 (L858R) status post left lower lobectomy with lymph node dissection under the care of Dr. Roxan Hockey. The patient is currently on observation.  She declined to proceed with adjuvant systemic chemotherapy or targeted therapy. The patient has been in observation since her surgical resection. Recent CT scan of the chest followed by PET scan showed suspicious pleural-based mass and nodularity in the left lung concerning for disease recurrence.   Her disease recurrence was confirmed with repeat biopsy of the pleural-based left lung mass. We had molecular studies on the new biopsy and it was consistent with EGFR mutation exon 21 (L858R). The patient started treatment with Tagrisso 80 mg p.o. daily status post almost 21 months of treatment.  She continues to tolerate her treatment with Tagrisso fairly well with no concerning adverse effects. I recommended for her to continue her current treatment with the same dose for now. I will see her back for follow-up visit in 2 months for evaluation with repeat CT scan of the chest for restaging of her disease. For the anemia of chronic disease, she is currently on oral iron tablets. For the dyspepsia, I recommended for the patient to take Pepcid 20 mg p.o. twice daily or as needed. For the hypertension, she will continue to monitor it closely at home. The patient has a lot of question about her condition and future treatment plans if she has any progression and also seeking another opinion for clinical trial if needed in the future.  I explained to the patient that I will have no objection if she sees any other oncologist in any tertiary centers for future plans if needed. She was advised to call immediately if she has any other concerning symptoms in the interval. The total time spent in the appointment was 40 minutes.  The patient voices understanding of current disease status and treatment options and is in agreement with the current care plan. All questions were answered. The patient knows to call the clinic with any problems, questions or concerns. We can certainly see the patient much sooner if necessary.  Disclaimer: This note was dictated with voice recognition software. Similar sounding words can inadvertently be transcribed and may not be corrected upon review.

## 2021-04-07 ENCOUNTER — Other Ambulatory Visit (HOSPITAL_COMMUNITY): Payer: Self-pay

## 2021-04-07 ENCOUNTER — Other Ambulatory Visit: Payer: Medicare Other | Admitting: General Practice

## 2021-04-22 ENCOUNTER — Other Ambulatory Visit (HOSPITAL_COMMUNITY): Payer: Self-pay

## 2021-04-26 ENCOUNTER — Other Ambulatory Visit (HOSPITAL_COMMUNITY): Payer: Self-pay

## 2021-05-19 ENCOUNTER — Other Ambulatory Visit (HOSPITAL_COMMUNITY): Payer: Self-pay

## 2021-06-01 ENCOUNTER — Encounter: Payer: Self-pay | Admitting: Internal Medicine

## 2021-06-01 ENCOUNTER — Inpatient Hospital Stay: Payer: Medicare Other | Attending: Internal Medicine

## 2021-06-01 ENCOUNTER — Encounter: Payer: Self-pay | Admitting: Thoracic Surgery (Cardiothoracic Vascular Surgery)

## 2021-06-01 ENCOUNTER — Other Ambulatory Visit: Payer: Self-pay

## 2021-06-01 ENCOUNTER — Ambulatory Visit (INDEPENDENT_AMBULATORY_CARE_PROVIDER_SITE_OTHER): Payer: Medicare Other | Admitting: Thoracic Surgery (Cardiothoracic Vascular Surgery)

## 2021-06-01 ENCOUNTER — Other Ambulatory Visit (HOSPITAL_COMMUNITY): Payer: Self-pay

## 2021-06-01 ENCOUNTER — Ambulatory Visit (HOSPITAL_COMMUNITY)
Admission: RE | Admit: 2021-06-01 | Discharge: 2021-06-01 | Disposition: A | Discharge status: Home / Self Care | Payer: Medicare Other | Source: Ambulatory Visit | Attending: Internal Medicine | Admitting: Internal Medicine

## 2021-06-01 ENCOUNTER — Inpatient Hospital Stay (HOSPITAL_BASED_OUTPATIENT_CLINIC_OR_DEPARTMENT_OTHER): Payer: Medicare Other | Admitting: Internal Medicine

## 2021-06-01 VITALS — BP 157/73 | HR 87 | Resp 20 | Ht 64.0 in

## 2021-06-01 DIAGNOSIS — D638 Anemia in other chronic diseases classified elsewhere: Secondary | ICD-10-CM | POA: Insufficient documentation

## 2021-06-01 DIAGNOSIS — Z1509 Genetic susceptibility to other malignant neoplasm: Secondary | ICD-10-CM | POA: Diagnosis not present

## 2021-06-01 DIAGNOSIS — Z5111 Encounter for antineoplastic chemotherapy: Secondary | ICD-10-CM

## 2021-06-01 DIAGNOSIS — C3432 Malignant neoplasm of lower lobe, left bronchus or lung: Secondary | ICD-10-CM | POA: Diagnosis not present

## 2021-06-01 DIAGNOSIS — Z902 Acquired absence of lung [part of]: Secondary | ICD-10-CM | POA: Insufficient documentation

## 2021-06-01 DIAGNOSIS — K219 Gastro-esophageal reflux disease without esophagitis: Secondary | ICD-10-CM | POA: Insufficient documentation

## 2021-06-01 DIAGNOSIS — C3490 Malignant neoplasm of unspecified part of unspecified bronchus or lung: Secondary | ICD-10-CM

## 2021-06-01 DIAGNOSIS — C3492 Malignant neoplasm of unspecified part of left bronchus or lung: Secondary | ICD-10-CM | POA: Diagnosis not present

## 2021-06-01 DIAGNOSIS — Z79899 Other long term (current) drug therapy: Secondary | ICD-10-CM

## 2021-06-01 LAB — CMP (CANCER CENTER ONLY)
ALT: 17 U/L (ref 0–44)
AST: 24 U/L (ref 15–41)
Albumin: 4.2 g/dL (ref 3.5–5.0)
Alkaline Phosphatase: 50 U/L (ref 38–126)
Anion gap: 6 (ref 5–15)
BUN: 20 mg/dL (ref 8–23)
CO2: 27 mmol/L (ref 22–32)
Calcium: 9.2 mg/dL (ref 8.9–10.3)
Chloride: 98 mmol/L (ref 98–111)
Creatinine: 0.83 mg/dL (ref 0.44–1.00)
GFR, Estimated: >60 mL/min
Glucose, Bld: 99 mg/dL (ref 70–99)
Potassium: 4 mmol/L (ref 3.5–5.1)
Sodium: 131 mmol/L — ABNORMAL LOW (ref 135–145)
Total Bilirubin: 0.3 mg/dL (ref 0.3–1.2)
Total Protein: 6.7 g/dL (ref 6.5–8.1)

## 2021-06-01 LAB — CBC WITH DIFFERENTIAL (CANCER CENTER ONLY)
Abs Immature Granulocytes: 0.01 10*3/uL (ref 0.00–0.07)
Basophils Absolute: 0 10*3/uL (ref 0.0–0.1)
Basophils Relative: 1 %
Eosinophils Absolute: 0.1 10*3/uL (ref 0.0–0.5)
Eosinophils Relative: 1 %
HCT: 29.3 % — ABNORMAL LOW (ref 36.0–46.0)
Hemoglobin: 9.6 g/dL — ABNORMAL LOW (ref 12.0–15.0)
Immature Granulocytes: 0 %
Lymphocytes Relative: 36 %
Lymphs Abs: 2 10*3/uL (ref 0.7–4.0)
MCH: 31.8 pg (ref 26.0–34.0)
MCHC: 32.8 g/dL (ref 30.0–36.0)
MCV: 97 fL (ref 80.0–100.0)
Monocytes Absolute: 0.6 10*3/uL (ref 0.1–1.0)
Monocytes Relative: 11 %
Neutro Abs: 2.9 10*3/uL (ref 1.7–7.7)
Neutrophils Relative %: 51 %
Platelet Count: 298 10*3/uL (ref 150–400)
RBC: 3.02 MIL/uL — ABNORMAL LOW (ref 3.87–5.11)
RDW: 13.2 % (ref 11.5–15.5)
WBC Count: 5.6 10*3/uL (ref 4.0–10.5)
nRBC: 0 % (ref 0.0–0.2)

## 2021-06-01 NOTE — Progress Notes (Signed)
[address]       [place] [address]             [phone]     HPI: [name] returns for a scheduled follow-up visit   [name] is a 71 year old woman with a history of adenocarcinoma of the lung, hypertension, hyperlipidemia, reflux, aortic atherosclerosis, migraines, anemia, and numerous medication allergies.  She had a left lower lobectomy in January 2020.  She had stage IIa disease but opted not to have adjuvant therapy.  She was diagnosed with a recurrence about a year later.  She has been on Tagrisso since February 2021.  She saw Dr. [name] for a 12-month CT earlier today.  She says she has been doing well.  She has been having some issues with reflux.  She started on Pepcid and that has helped significantly.    She is concerned about her CT report from today's scan.  Past Medical History:  Diagnosis Date   Anemia    Atherosclerosis    GERD (gastroesophageal reflux disease)    Hemoptysis    Hyperlipidemia    Hypertension    IBS (irritable bowel syndrome)    Lung mass    Migraines    Spondylosis    Vitamin D deficiency      Current Outpatient Medications  Medication Sig Dispense Refill   bismuth subsalicylate (PEPTO BISMOL) 262 MG/15ML suspension Take 30 mLs by mouth every 6 (six) hours as needed.     Carboxymethylcellul-Glycerin (LUBRICATING EYE DROPS OP) Place 1 drop into both eyes daily as needed (dry eyes).     Cholecalciferol (VITAMIN D3) LIQD Take 4,000 Units by mouth daily.     diltiazem (CARDIZEM CD) 120 MG 24 hr capsule Take 120 mg by mouth daily.     EPINEPHrine 0.3 mg/0.3 mL IJ SOAJ injection Inject 0.3 mg into the muscle as needed for anaphylaxis.     famotidine (PEPCID) 40 MG tablet Take 40 mg by mouth daily.     Omega 3-6-9 Fatty Acids (OMEGA-3 FUSION) LIQD Take by mouth.     osimertinib mesylate (TAGRISSO) 80 MG tablet TAKE 1 TABLET (80 MG TOTAL) BY MOUTH DAILY. 90 tablet 1   OVER THE COUNTER MEDICATION Triamcinolone,  urea and menthol and camphor     OVER THE COUNTER MEDICATION Iron gummies.     PRESCRIPTION MEDICATION Apply 1 application topically 2 (two) times daily as needed for itching. Dr Molinda Bailiff 2019     rizatriptan (MAXALT) 10 MG tablet Take 5 mg by mouth as needed for migraine. May repeat in 2 hours if needed     No current facility-administered medications for this visit.    Physical Exam BP (!) 157/73 (BP Location: Right Arm, Patient Position: Sitting)    Pulse 87    Resp 20    Ht 5\' 4"  (1.626 m)    SpO2 97% Comment: RA   BMI 23.40 kg/m  Well-appearing 71 year old woman in no acute distress Alert and oriented x3 with no focal deficits Lungs diminished at left base but otherwise clear Cardiac regular rate and rhythm No cervical or supraclavicular adenopathy  Diagnostic Tests: CT CHEST WITHOUT CONTRAST   TECHNIQUE: Multidetector CT imaging of the chest was performed following the standard protocol without IV contrast.   RADIATION DOSE REDUCTION: This exam was performed according to the departmental dose-optimization program which includes automated exposure control, adjustment of the mA and/or kV according to patient size and/or use of iterative reconstruction technique.  COMPARISON:  02/03/2021   FINDINGS: Cardiovascular: The heart size is normal. No substantial pericardial effusion. Coronary artery calcification is evident. Moderate atherosclerotic calcification is noted in the wall of the thoracic aorta.   Mediastinum/Nodes: No mediastinal lymphadenopathy. No evidence for gross hilar lymphadenopathy although assessment is limited by the lack of intravenous contrast on the current study. The esophagus has normal imaging features. Tiny hiatal hernia noted. 8 mm short axis distal paraesophageal node on image 116/2 is new in the interval. A second paraesophageal node measuring about 6 mm short axis on 115/2 is stable in the interval. There is no axillary lymphadenopathy.    Lungs/Pleura: Volume loss left hemithorax again noted consistent with left lower lobectomy. Suprahilar scarring is stable. The index subtle plaque-like paraspinal left pleural lesions are stable including on images 58/2, 67/2, and 113/2. No new suspicious pulmonary nodule or mass. No focal airspace consolidation. There is no evidence of pleural effusion.   Upper Abdomen: Unremarkable.   Musculoskeletal: No worrisome lytic or sclerotic osseous abnormality.   IMPRESSION: 1. 8 mm short axis distal paraesophageal node is new in the interval. Close interval follow-up recommended. 2. Otherwise stable exam with no other new or acute interval findings. Stable appearance of the index subtle plaque-like paraspinal left pleural lesions. 3. Status post left lower lobectomy. 4. Aortic Atherosclerosis (ICD10-I70.0).     Electronically Signed   By: Misty Stanley M.D.   On: 06/01/2021 11:50 I personally reviewed the CT images.  I compared the images to some older scans.  No change in the small lesion at the dome of the liver seen on the last scan.  Looking back at his scan from May 2021 there was a node in that paraesophageal area.  In a lot of the scans that area is not clearly visible.  Impression: [name] is a 71 year old woman who had a left lower lobectomy for stage IIa adenocarcinoma back in January 2020.  She had a tough postoperative course.  She declined adjuvant therapy.  She was diagnosed with a recurrence about a year later with a pleural-based nodule.  She was started on Tagrisso and has been on that for about 2 years.  Her CT today showed an 8 mm paraesophageal node that was not visible on her last scan.  There is a 6 mm node adjacent to it that was visible.  I went back to multiple CT scans.  That area is not always clearly visible.  On a scan from May 2021 there was noted about the same size that was easily visible in that area.  This does not completely rule out the possibility  that that is metastatic disease but does offer potential alternative explanation.  I agree with the plan for a repeat CT in about 6 weeks.  Plan: Agree with plan for 6-week follow-up with Dr. [name] I will hope to see her after she sees him.  [name], MD [facility] [phone]

## 2021-06-01 NOTE — Progress Notes (Signed)
[place] [phone]   [phone]  OFFICE PROGRESS NOTE  Donald Prose, MD [place] [address]  DIAGNOSIS: Recurrent non-small cell lung cancer initially diagnosed as stage IIA (T2b, N0, M0) non-small cell lung cancer, adenocarcinoma diagnosed in November 2019  Molecular studies showed EGFR mutation in exon 21 (L858R) PD-L1 expression 0  PRIOR THERAPY: status post left lower lobectomy with lymph node dissection on April 25, 2018 under the care of Dr. Roxan Hockey.  She declined adjuvant therapy  CURRENT THERAPY: Osimertinib (Tagrisso) 80 mg p.o. daily.  First dose was on June 19, 2019.  Status post 23 months of treatment.  INTERVAL HISTORY: [name] 71 y.o. female returns to the clinic today for follow-up visit accompanied by her husband.  The patient is feeling fine today with no concerning complaints except for fatigue that is worse after she does a lot of work.  She denied having any current chest pain, shortness of breath, cough or hemoptysis.  She denied having any fever or chills.  She has no nausea, vomiting or constipation but recently has few more episodes of diarrhea after starting Pepcid for the acid reflux.  She has not any significant weight loss or night sweats.  She has no headache or visual changes.  She had repeat blood work as well as CT scan of the chest performed recently and she is here for evaluation and discussion of her scan results.   MEDICAL HISTORY: Past Medical History:  Diagnosis Date   Anemia    Atherosclerosis    GERD (gastroesophageal reflux disease)    Hemoptysis    Hyperlipidemia    Hypertension    IBS (irritable bowel syndrome)    Lung mass    Migraines    Spondylosis    Vitamin D deficiency     ALLERGIES:  is allergic to avocado, influenza vaccines, lisinopril, metoprolol, nexium [esomeprazole magnesium], penicillins, pork-derived products, proton pump inhibitors, sulfites,  wasp venom protein, zantac [ranitidine hcl], cefaclor, chlorhexidine, chloroxine, ciprofloxacin hcl, clarithromycin, gatifloxacin, other, sulfa antibiotics, sulfamethoxazole-trimethoprim, eggs or egg-derived products, aspartame, chocolate, dilaudid [hydromorphone hcl], fentanyl, ivp dye [iodinated contrast media], ketorolac, milk-related compounds, molds & smuts, oxycodone, prednisolone, shellfish allergy, soap, and tilactase.  MEDICATIONS:  Current Outpatient Medications  Medication Sig Dispense Refill   bismuth subsalicylate (PEPTO BISMOL) 262 MG/15ML suspension Take 30 mLs by mouth every 6 (six) hours as needed.     Carboxymethylcellul-Glycerin (LUBRICATING EYE DROPS OP) Place 1 drop into both eyes daily as needed (dry eyes).     Cholecalciferol (VITAMIN D3) LIQD Take 4,000 Units by mouth daily.     diltiazem (CARDIZEM CD) 120 MG 24 hr capsule Take 120 mg by mouth daily.     famotidine (PEPCID) 40 MG tablet Take 40 mg by mouth daily.     Omega 3-6-9 Fatty Acids (OMEGA-3 FUSION) LIQD Take by mouth.     osimertinib mesylate (TAGRISSO) 80 MG tablet TAKE 1 TABLET (80 MG TOTAL) BY MOUTH DAILY. 90 tablet 1   OVER THE COUNTER MEDICATION Triamcinolone, urea and menthol and camphor     OVER THE COUNTER MEDICATION Iron gummies.     PRESCRIPTION MEDICATION Apply 1 application topically 2 (two) times daily as needed for itching. Dr Molinda Bailiff 2019     rizatriptan (MAXALT) 10 MG tablet Take 5 mg by mouth as needed for migraine. May repeat in 2 hours if needed     EPINEPHrine 0.3 mg/0.3 mL IJ SOAJ injection  Inject 0.3 mg into the muscle as needed for anaphylaxis. (Patient not taking: Reported on 04/06/2021)     No current facility-administered medications for this visit.    SURGICAL HISTORY:  Past Surgical History:  Procedure Laterality Date   BROW LIFT AND BLEPHAROPLASTY  03/15/2013   CATARACT EXTRACTION, BILATERAL  06/23/2014   CHEST TUBE INSERTION Left 05/10/2018   Procedure: CHEST TUBE INSERTION;   Surgeon: [name], MD;  Location: [facility];  Service: Thoracic;  Laterality: Left;   COLONOSCOPY  05/2014   ESOPHAGOGASTRODUODENOSCOPY  2019   EYE SURGERY     IR THORACENTESIS ASP PLEURAL SPACE W/IMG GUIDE  05/24/2018   IR THORACENTESIS ASP PLEURAL SPACE W/IMG GUIDE  06/14/2018   TONSILLECTOMY     VIDEO ASSISTED THORACOSCOPY (VATS)/ LOBECTOMY Left 04/25/2018   Procedure: VIDEO ASSISTED THORACOSCOPY (VATS)LEFT LOWER LOBECTOMY, NODE DISSECTION;  Surgeon: [name], MD;  Location: [facility];  Service: Thoracic;  Laterality: Left;   VIDEO BRONCHOSCOPY WITH ENDOBRONCHIAL NAVIGATION N/A 03/07/2018   Procedure: VIDEO BRONCHOSCOPY WITH ENDOBRONCHIAL NAVIGATION;  Surgeon: Collene Gobble, MD;  Location: [facility] OR;  Service: Thoracic;  Laterality: N/A;   VIDEO BRONCHOSCOPY WITH INSERTION OF INTERBRONCHIAL VALVE (IBV) N/A 05/10/2018   Procedure: VIDEO BRONCHOSCOPY WITH INSERTION OF INTERBRONCHIAL VALVE (IBV);  Surgeon: [name], MD;  Location: [facility] OR;  Service: Thoracic;  Laterality: N/A;   VIDEO BRONCHOSCOPY WITH INSERTION OF INTERBRONCHIAL VALVE (IBV) N/A 06/13/2018   Procedure: VIDEO BRONCHOSCOPY WITH REMOVAL OF INTERBRONCHIAL VALVE (IBV) x 2;  Surgeon: [name], MD;  Location: [facility];  Service: Thoracic;  Laterality: N/A;    REVIEW OF SYSTEMS:  Constitutional: positive for fatigue Eyes: negative Ears, nose, mouth, throat, and face: negative Respiratory: negative Cardiovascular: negative Gastrointestinal: positive for diarrhea and dyspepsia Genitourinary:negative Integument/breast: negative Hematologic/lymphatic: negative Musculoskeletal:negative Neurological: negative Behavioral/Psych: negative Endocrine: negative Allergic/Immunologic: negative   PHYSICAL EXAMINATION: General appearance: alert, cooperative, fatigued, and no distress Head: Normocephalic, without obvious abnormality, atraumatic Neck: no adenopathy, no JVD, supple, symmetrical, trachea midline,  and thyroid not enlarged, symmetric, no tenderness/mass/nodules Lymph nodes: Cervical, supraclavicular, and axillary nodes normal. Resp: clear to auscultation bilaterally Back: symmetric, no curvature. ROM normal. No CVA tenderness. Cardio: regular rate and rhythm, S1, S2 normal, no murmur, click, rub or gallop GI: soft, non-tender; bowel sounds normal; no masses,  no organomegaly Extremities: extremities normal, atraumatic, no cyanosis or edema Neurologic: Alert and oriented X 3, normal strength and tone. Normal symmetric reflexes. Normal coordination and gait   ECOG PERFORMANCE STATUS: 1 - Symptomatic but completely ambulatory  There were no vitals taken for this visit.   LABORATORY DATA: Lab Results  Component Value Date   WBC 5.6 06/01/2021   HGB 9.6 (L) 06/01/2021   HCT 29.3 (L) 06/01/2021   MCV 97.0 06/01/2021   PLT 298 06/01/2021      Chemistry      Component Value Date/Time   NA 131 (L) 06/01/2021 1036   K 4.0 06/01/2021 1036   CL 98 06/01/2021 1036   CO2 27 06/01/2021 1036   BUN 20 06/01/2021 1036   CREATININE 0.83 06/01/2021 1036      Component Value Date/Time   CALCIUM 9.2 06/01/2021 1036   ALKPHOS 50 06/01/2021 1036   AST 24 06/01/2021 1036   ALT 17 06/01/2021 1036   BILITOT 0.3 06/01/2021 1036       RADIOGRAPHIC STUDIES: CT Chest Wo Contrast  Result Date: 06/01/2021 CLINICAL DATA:  Non-small-cell lung cancer.  Restaging. EXAM: CT  CHEST WITHOUT CONTRAST TECHNIQUE: Multidetector CT imaging of the chest was performed following the standard protocol without IV contrast. RADIATION DOSE REDUCTION: This exam was performed according to the departmental dose-optimization program which includes automated exposure control, adjustment of the mA and/or kV according to patient size and/or use of iterative reconstruction technique. COMPARISON:  02/03/2021 FINDINGS: Cardiovascular: The heart size is normal. No substantial pericardial effusion. Coronary artery calcification  is evident. Moderate atherosclerotic calcification is noted in the wall of the thoracic aorta. Mediastinum/Nodes: No mediastinal lymphadenopathy. No evidence for gross hilar lymphadenopathy although assessment is limited by the lack of intravenous contrast on the current study. The esophagus has normal imaging features. Tiny hiatal hernia noted. 8 mm short axis distal paraesophageal node on image 116/2 is new in the interval. A second paraesophageal node measuring about 6 mm short axis on 115/2 is stable in the interval. There is no axillary lymphadenopathy. Lungs/Pleura: Volume loss left hemithorax again noted consistent with left lower lobectomy. Suprahilar scarring is stable. The index subtle plaque-like paraspinal left pleural lesions are stable including on images 58/2, 67/2, and 113/2. No new suspicious pulmonary nodule or mass. No focal airspace consolidation. There is no evidence of pleural effusion. Upper Abdomen: Unremarkable. Musculoskeletal: No worrisome lytic or sclerotic osseous abnormality. IMPRESSION: 1. 8 mm short axis distal paraesophageal node is new in the interval. Close interval follow-up recommended. 2. Otherwise stable exam with no other new or acute interval findings. Stable appearance of the index subtle plaque-like paraspinal left pleural lesions. 3. Status post left lower lobectomy. 4. Aortic Atherosclerosis (ICD10-I70.0). Electronically Signed   By: Misty Stanley M.D.   On: 06/01/2021 11:50    ASSESSMENT AND PLAN: This is a very pleasant 71 years old white female with a stage IIa non-small cell lung cancer, adenocarcinoma with positive EGFR mutation in exon 21 (L858R) status post left lower lobectomy with lymph node dissection under the care of Dr. Roxan Hockey. The patient is currently on observation.  She declined to proceed with adjuvant systemic chemotherapy or targeted therapy. The patient has been in observation since her surgical resection. Recent CT scan of the chest followed  by PET scan showed suspicious pleural-based mass and nodularity in the left lung concerning for disease recurrence.  Her disease recurrence was confirmed with repeat biopsy of the pleural-based left lung mass. We had molecular studies on the new biopsy and it was consistent with EGFR mutation exon 21 (L858R). The patient started treatment with Tagrisso 80 mg p.o. daily status post almost 23 months of treatment.  The patient has been tolerating her treatment with Tagrisso fairly well except for few more episodes of diarrhea recently after starting Pepcid for the acid reflux. She had repeat CT scan of the chest performed recently.  I personally and independently reviewed the scan images and discussed the result and showed the images to the patient and her husband today. Her scan showed new 0.8 cm short axis distal paraesophageal node that developed in the interval. I had a lengthy discussion with the patient and her husband about her condition and future management.  I gave the patient the option of proceeding with a PET scan now but this may not be beneficial if the lymph node showed no hypermetabolic activity because of the smaller size.  The other option would be to monitor it closely with repeat CT scan of the chest in around 6 weeks and if it continues to increase in size, I will consider her for repeat PET scan and probably  palliative radiotherapy to this area if there is no other areas of disease metastasis. For the anemia, she will continue with the oral iron tablets for now. For the dyspepsia she is currently on Pepcid 20 mg p.o. twice daily. She is also scheduled to see Dr. Roxan Hockey later today for recommendation. The patient was advised to call immediately if she has any other concerning symptoms in the interval.  The patient voices understanding of current disease status and treatment options and is in agreement with the current care plan. All questions were answered. The patient knows to  call the clinic with any problems, questions or concerns. We can certainly see the patient much sooner if necessary.  Disclaimer: This note was dictated with voice recognition software. Similar sounding words can inadvertently be transcribed and may not be corrected upon review.

## 2021-06-08 ENCOUNTER — Ambulatory Visit: Payer: Medicare Other | Admitting: Thoracic Surgery (Cardiothoracic Vascular Surgery)

## 2021-06-21 ENCOUNTER — Other Ambulatory Visit (HOSPITAL_COMMUNITY): Payer: Self-pay

## 2021-06-28 ENCOUNTER — Other Ambulatory Visit (HOSPITAL_COMMUNITY): Payer: Self-pay

## 2021-07-14 ENCOUNTER — Other Ambulatory Visit: Payer: Medicare Other

## 2021-07-14 ENCOUNTER — Ambulatory Visit: Payer: Medicare Other | Admitting: Internal Medicine

## 2021-07-15 ENCOUNTER — Other Ambulatory Visit: Payer: Self-pay

## 2021-07-15 ENCOUNTER — Inpatient Hospital Stay: Payer: Medicare Other | Attending: Internal Medicine

## 2021-07-15 ENCOUNTER — Ambulatory Visit (INDEPENDENT_AMBULATORY_CARE_PROVIDER_SITE_OTHER): Payer: Medicare Other | Admitting: Thoracic Surgery (Cardiothoracic Vascular Surgery)

## 2021-07-15 ENCOUNTER — Inpatient Hospital Stay (HOSPITAL_BASED_OUTPATIENT_CLINIC_OR_DEPARTMENT_OTHER): Payer: Medicare Other | Admitting: Internal Medicine

## 2021-07-15 ENCOUNTER — Ambulatory Visit (HOSPITAL_COMMUNITY)
Admission: RE | Admit: 2021-07-15 | Discharge: 2021-07-15 | Disposition: A | Discharge status: Home / Self Care | Payer: Medicare Other | Source: Ambulatory Visit | Attending: Internal Medicine | Admitting: Internal Medicine

## 2021-07-15 VITALS — BP 149/78 | HR 79 | Resp 20 | Ht 64.0 in | Wt 138.0 lb

## 2021-07-15 VITALS — BP 150/68 | HR 86 | Temp 98.0°F | Wt 138.0 lb

## 2021-07-15 DIAGNOSIS — D649 Anemia, unspecified: Secondary | ICD-10-CM | POA: Insufficient documentation

## 2021-07-15 DIAGNOSIS — Z79899 Other long term (current) drug therapy: Secondary | ICD-10-CM | POA: Diagnosis not present

## 2021-07-15 DIAGNOSIS — C3492 Malignant neoplasm of unspecified part of left bronchus or lung: Secondary | ICD-10-CM | POA: Diagnosis not present

## 2021-07-15 DIAGNOSIS — C3490 Malignant neoplasm of unspecified part of unspecified bronchus or lung: Secondary | ICD-10-CM | POA: Insufficient documentation

## 2021-07-15 DIAGNOSIS — R1013 Epigastric pain: Secondary | ICD-10-CM | POA: Insufficient documentation

## 2021-07-15 DIAGNOSIS — Z5111 Encounter for antineoplastic chemotherapy: Secondary | ICD-10-CM | POA: Diagnosis not present

## 2021-07-15 DIAGNOSIS — C3432 Malignant neoplasm of lower lobe, left bronchus or lung: Secondary | ICD-10-CM | POA: Diagnosis not present

## 2021-07-15 LAB — CMP (CANCER CENTER ONLY)
ALT: 16 U/L (ref 0–44)
AST: 22 U/L (ref 15–41)
Albumin: 4.5 g/dL (ref 3.5–5.0)
Alkaline Phosphatase: 54 U/L (ref 38–126)
Anion gap: 7 (ref 5–15)
BUN: 19 mg/dL (ref 8–23)
CO2: 28 mmol/L (ref 22–32)
Calcium: 9.4 mg/dL (ref 8.9–10.3)
Chloride: 98 mmol/L (ref 98–111)
Creatinine: 0.85 mg/dL (ref 0.44–1.00)
GFR, Estimated: >60 mL/min (ref >60)
Glucose, Bld: 102 mg/dL — ABNORMAL HIGH (ref 70–99)
Potassium: 4.4 mmol/L (ref 3.5–5.1)
Sodium: 133 mmol/L — ABNORMAL LOW (ref 135–145)
Total Bilirubin: 0.4 mg/dL (ref 0.3–1.2)
Total Protein: 6.9 g/dL (ref 6.5–8.1)

## 2021-07-15 LAB — CBC WITH DIFFERENTIAL (CANCER CENTER ONLY)
Abs Immature Granulocytes: 0.01 10*3/uL (ref 0.00–0.07)
Basophils Absolute: 0 10*3/uL (ref 0.0–0.1)
Basophils Relative: 1 %
Eosinophils Absolute: 0.1 10*3/uL (ref 0.0–0.5)
Eosinophils Relative: 1 %
HCT: 30.6 % — ABNORMAL LOW (ref 36.0–46.0)
Hemoglobin: 10 g/dL — ABNORMAL LOW (ref 12.0–15.0)
Immature Granulocytes: 0 %
Lymphocytes Relative: 32 %
Lymphs Abs: 2.1 10*3/uL (ref 0.7–4.0)
MCH: 31.8 pg (ref 26.0–34.0)
MCHC: 32.7 g/dL (ref 30.0–36.0)
MCV: 97.5 fL (ref 80.0–100.0)
Monocytes Absolute: 0.6 10*3/uL (ref 0.1–1.0)
Monocytes Relative: 9 %
Neutro Abs: 3.8 10*3/uL (ref 1.7–7.7)
Neutrophils Relative %: 57 %
Platelet Count: 239 10*3/uL (ref 150–400)
RBC: 3.14 MIL/uL — ABNORMAL LOW (ref 3.87–5.11)
RDW: 13.1 % (ref 11.5–15.5)
WBC Count: 6.5 10*3/uL (ref 4.0–10.5)
nRBC: 0 % (ref 0.0–0.2)

## 2021-07-15 NOTE — Progress Notes (Signed)
? ?   ?[address] ?      [place] [address] ?            [phone]   ? ?HPI: [name] returns for scheduled follow-up visit ? ?[name] is a 70 year old woman with a history of adenocarcinoma of the lung, hypertension, hyperlipidemia, reflux, aortic atherosclerosis, migraines, anemia, and numerous medication allergies.  She had a left lower lobectomy in January 2020.  She had stage IIa disease but opted not to have adjuvant therapy.  She was diagnosed with a recurrence about a year later.  She has been on Tagrisso since February 2021.  ? ?She was seen in the office about 6 weeks ago.  She had a follow-up CT which showed some questionable paraesophageal lymph nodes.  On further review the nodes had been present previously. ? ?In the interim since her last visit she has been feeling well.  She is excited about a trip she has coming up to [place] with her family next week.  She saw Dr. [name] earlier today. ? ?Past Medical History:  ?Diagnosis Date  ? Anemia   ? Atherosclerosis   ? GERD (gastroesophageal reflux disease)   ? Hemoptysis   ? Hyperlipidemia   ? Hypertension   ? IBS (irritable bowel syndrome)   ? Lung mass   ? Migraines   ? Spondylosis   ? Vitamin D deficiency   ? ? ?Current Outpatient Medications  ?Medication Sig Dispense Refill  ? Carboxymethylcellul-Glycerin (LUBRICATING EYE DROPS OP) Place 1 drop into both eyes daily as needed (dry eyes).    ? Cholecalciferol (VITAMIN D3) LIQD Take 2,000 Units by mouth daily.    ? Cyanocobalamin (VITAMIN B-12) 1000 MCG SUBL Place 1 tablet under the tongue once a week.    ? diltiazem (CARDIZEM CD) 120 MG 24 hr capsule Take 120 mg by mouth daily.    ? EPINEPHrine 0.3 mg/0.3 mL IJ SOAJ injection Inject 0.3 mg into the muscle as needed for anaphylaxis.    ? famotidine (PEPCID) 40 MG tablet Take 40 mg by mouth daily.    ? Multiple Vitamin (MULTIVITAMIN) tablet Take 1 tablet by mouth daily.    ? Omega 3-6-9 Fatty Acids (OMEGA-3 FUSION) LIQD Take  by mouth.    ? osimertinib mesylate (TAGRISSO) 80 MG tablet TAKE 1 TABLET (80 MG TOTAL) BY MOUTH DAILY. 90 tablet 1  ? OVER THE COUNTER MEDICATION Triamcinolone, urea and menthol and camphor    ? OVER THE COUNTER MEDICATION Iron gummies.    ? PRESCRIPTION MEDICATION Apply 1 application topically 2 (two) times daily as needed for itching. Dr Molinda Bailiff 2019    ? rizatriptan (MAXALT) 10 MG tablet Take 5 mg by mouth as needed for migraine. May repeat in 2 hours if needed    ? bismuth subsalicylate (PEPTO BISMOL) 262 MG/15ML suspension Take 30 mLs by mouth every 6 (six) hours as needed.    ? ?No current facility-administered medications for this visit.  ? ? ?Physical Exam ?BP (!) 149/78   Pulse 79   Resp 20   Ht 5\' 4"  (1.626 m)   Wt 138 lb (62.6 kg)   SpO2 97% Comment: RA  BMI 23.69 kg/m?  ?Well-appearing 71 year old woman in no acute distress ?Alert and oriented x3 with no focal deficits ? ?Diagnostic Tests: ?CT CHEST WITHOUT CONTRAST ?  ?TECHNIQUE: ?Multidetector CT imaging of the chest was performed following the ?standard protocol without IV contrast. ?  ?RADIATION DOSE REDUCTION: This exam was  performed according to the ?departmental dose-optimization program which includes automated ?exposure control, adjustment of the mA and/or kV according to ?patient size and/or use of iterative reconstruction technique. ?  ?COMPARISON:  06/01/2021 ?  ?FINDINGS: ?Cardiovascular: The heart size is normal. No substantial pericardial ?effusion. Coronary artery calcification is evident. Mild ?atherosclerotic calcification is noted in the wall of the thoracic ?aorta. ?  ?Mediastinum/Nodes: No mediastinal lymphadenopathy. The index high ?right paratracheal node measured on multiple prior studies at 8 mm ?remains stable. No evidence for gross hilar lymphadenopathy although ?assessment is limited by the lack of intravenous contrast on the ?current study. Mild circumferential wall thickening again noted ?distal esophagus. Tiny  hiatal hernia again noted. Volume of ?herniated stomach appears decreased compared to the prior study. ?There is a persistent small paraesophageal node measuring 6 mm on ?image 107/2 today. The second 8 mm nodule of concern on the most ?recent comparison study is identified below the level of the ?diaphragmatic hiatus on today's exam (image 113/2). There is no ?axillary lymphadenopathy. ?  ?Lungs/Pleura: Stable volume loss left hemithorax compatible with ?prior left lower lobectomy. There is no new suspicious pulmonary ?nodule or mass on today's study. Chronic subsegmental atelectasis or ?scarring in the right lower lobe is unchanged. Previously described ?index areas of subtle plaque-like paraspinal pleural thickening in ?the left hemithorax are stable (images 52/2, 59/2, and 105/2). No ?focal airspace consolidation. There is no evidence of pleural ?effusion. ?  ?Upper Abdomen: Unremarkable. ?  ?Musculoskeletal: No worrisome lytic or sclerotic osseous ?abnormality. ?  ?IMPRESSION: ?1. Stable exam.  No new or progressive findings on today's study. ?2. 8 mm distal paraesophageal nodule of concern on the prior study ?is unchanged and located below the diaphragmatic hiatus on today's ?exam. In the addendum of the previous report, it was noted that this ?nodule has variably been present in different locations on prior ?studies and has appeared above and below the diaphragmatic hiatus. ?The volume of stomach in the hiatal hernia today is less than on the ?study from 06/01/2021 and as suggested previously, this 8 mm nodule ?is probably moving with the proximal stomach above and below the ?diaphragmatic hiatus with a sliding type hiatal hernia. Given that ?this nodule has now been stable for more than 2 years (dating back ?to 05/21/2019), it is most consistent with benign/reactive etiology. ?3. Stable volume loss left hemithorax compatible with prior left ?lower lobectomy. ?4. Mild circumferential wall thickening again  noted distal ?esophagus. Esophagitis could have this appearance. ?5. Aortic Atherosclerosis (ICD10-I70.0). ?  ?  ?Electronically Signed ?  By: [name] M.D. ?  On: 07/15/2021 12:20 ?I personally reviewed the CT images.  I do not see any particular concerning findings. ? ?Impression: ?[name] is a 71 year old woman who had a left lower lobectomy for stage IIa adenocarcinoma back in January 2020.  She had a tough postoperative course.  She declined adjuvant therapy.  She was diagnosed with a recurrence about a year later with a pleural-based nodule.  She was started on Tagrisso and has been on that for about 2 years. ? ?On a scan about 6 weeks ago there were 2 paraesophageal lymph nodes noted.  On review of previous scans sometimes those nodes were visible and other times they were not.  On today's scan does appear stable.  There were no other findings worrisome for metastatic disease. ? ?Continue to follow paraesophageal nodes on serial scanning ? ?Plan: ?Return in 4 months after next CT chest ? ?[name], MD ?  [facility] ?[phone] ? ? ? ? ?

## 2021-07-15 NOTE — Progress Notes (Signed)
?    [facility] ?[phone]   [phone] ? ?OFFICE PROGRESS NOTE ? ?Meeks, Toni D, MD ?[address] ?[place] [address] ? ?DIAGNOSIS: Recurrent non-small cell lung cancer initially diagnosed as stage IIA (T2b, N0, M0) non-small cell lung cancer, adenocarcinoma diagnosed in November 2019  ?Molecular studies showed EGFR mutation in exon 21 (L858R) ?PD-L1 expression 0 ? ?PRIOR THERAPY: status post left lower lobectomy with lymph node dissection on April 25, 2018 under the care of Dr. Hendrickson.  She declined adjuvant therapy ? ?CURRENT THERAPY: Osimertinib (Tagrisso) 80 mg p.o. daily.  First dose was on June 19, 2019.  Status post 25 months of treatment. ? ?INTERVAL HISTORY: ?[name] 71 y.o. female returns to the clinic today for follow-up visit accompanied by her husband.  The patient is feeling much better today except for mild fatigue.  She denied having any skin rash.  She has occasional episodes of diarrhea.  She has no chest pain, shortness of breath, cough or hemoptysis.  She has no fever or chills.  She has no nausea, vomiting, abdominal pain or constipation.  She has no headache or visual changes.  She continues to tolerate her treatment with Tagrisso fairly well.  She is here today for evaluation with repeat CT scan of the chest for restaging of her disease. ? ? ?MEDICAL HISTORY: ?Past Medical History:  ?Diagnosis Date  ? Anemia   ? Atherosclerosis   ? GERD (gastroesophageal reflux disease)   ? Hemoptysis   ? Hyperlipidemia   ? Hypertension   ? IBS (irritable bowel syndrome)   ? Lung mass   ? Migraines   ? Spondylosis   ? Vitamin D deficiency   ? ? ?ALLERGIES:  is allergic to avocado, influenza vaccines, lisinopril, metoprolol, nexium [esomeprazole magnesium], penicillins, pork-derived products, proton pump inhibitors, sulfites, wasp venom protein, zantac [ranitidine hcl], cefaclor, chlorhexidine, chloroxine, ciprofloxacin hcl, clarithromycin,  gatifloxacin, other, sulfa antibiotics, sulfamethoxazole-trimethoprim, eggs or egg-derived products, aspartame, chocolate, dilaudid [hydromorphone hcl], fentanyl, ivp dye [iodinated contrast media], ketorolac, milk-related compounds, molds & smuts, oxycodone, prednisolone, shellfish allergy, soap, and tilactase. ? ?MEDICATIONS:  ?Current Outpatient Medications  ?Medication Sig Dispense Refill  ? bismuth subsalicylate (PEPTO BISMOL) 262 MG/15ML suspension Take 30 mLs by mouth every 6 (six) hours as needed.    ? Carboxymethylcellul-Glycerin (LUBRICATING EYE DROPS OP) Place 1 drop into both eyes daily as needed (dry eyes).    ? Cholecalciferol (VITAMIN D3) LIQD Take 4,000 Units by mouth daily.    ? diltiazem (CARDIZEM CD) 120 MG 24 hr capsule Take 120 mg by mouth daily.    ? EPINEPHrine 0.3 mg/0.3 mL IJ SOAJ injection Inject 0.3 mg into the muscle as needed for anaphylaxis.    ? famotidine (PEPCID) 40 MG tablet Take 40 mg by mouth daily.    ? Omega 3-6-9 Fatty Acids (OMEGA-3 FUSION) LIQD Take by mouth.    ? osimertinib mesylate (TAGRISSO) 80 MG tablet TAKE 1 TABLET (80 MG TOTAL) BY MOUTH DAILY. 90 tablet 1  ? OVER THE COUNTER MEDICATION Triamcinolone, urea and menthol and camphor    ? OVER THE COUNTER MEDICATION Iron gummies.    ? PRESCRIPTION MEDICATION Apply 1 application topically 2 (two) times daily as needed for itching. Dr Shereffs 2019    ? rizatriptan (MAXALT) 10 MG tablet Take 5 mg by mouth as needed for migraine. May repeat in 2 hours if needed    ? ?No current facility-administered medications for this visit.  ? ? ?  SURGICAL HISTORY:  ?Past Surgical History:  ?Procedure Laterality Date  ? BROW LIFT AND BLEPHAROPLASTY  03/15/2013  ? CATARACT EXTRACTION, BILATERAL  06/23/2014  ? CHEST TUBE INSERTION Left 05/10/2018  ? Procedure: CHEST TUBE INSERTION;  Surgeon: Hendrickson, Steven C, MD;  Location: [facility] OR;  Service: Thoracic;  Laterality: Left;  ? COLONOSCOPY  05/2014  ? ESOPHAGOGASTRODUODENOSCOPY  2019  ? EYE  SURGERY    ? IR THORACENTESIS ASP PLEURAL SPACE W/IMG GUIDE  05/24/2018  ? IR THORACENTESIS ASP PLEURAL SPACE W/IMG GUIDE  06/14/2018  ? TONSILLECTOMY    ? VIDEO ASSISTED THORACOSCOPY (VATS)/ LOBECTOMY Left 04/25/2018  ? Procedure: VIDEO ASSISTED THORACOSCOPY (VATS)LEFT LOWER LOBECTOMY, NODE DISSECTION;  Surgeon: Hendrickson, Steven C, MD;  Location: [facility] OR;  Service: Thoracic;  Laterality: Left;  ? VIDEO BRONCHOSCOPY WITH ENDOBRONCHIAL NAVIGATION N/A 03/07/2018  ? Procedure: VIDEO BRONCHOSCOPY WITH ENDOBRONCHIAL NAVIGATION;  Surgeon: Byrum, Robert S, MD;  Location: [facility];  Service: Thoracic;  Laterality: N/A;  ? VIDEO BRONCHOSCOPY WITH INSERTION OF INTERBRONCHIAL VALVE (IBV) N/A 05/10/2018  ? Procedure: VIDEO BRONCHOSCOPY WITH INSERTION OF INTERBRONCHIAL VALVE (IBV);  Surgeon: Hendrickson, Steven C, MD;  Location: [facility];  Service: Thoracic;  Laterality: N/A;  ? VIDEO BRONCHOSCOPY WITH INSERTION OF INTERBRONCHIAL VALVE (IBV) N/A 06/13/2018  ? Procedure: VIDEO BRONCHOSCOPY WITH REMOVAL OF INTERBRONCHIAL VALVE (IBV) x 2;  Surgeon: Hendrickson, Steven C, MD;  Location: [facility];  Service: Thoracic;  Laterality: N/A;  ? ? ?REVIEW OF SYSTEMS:  Constitutional: positive for fatigue ?Eyes: negative ?Ears, nose, mouth, throat, and face: negative ?Respiratory: negative ?Cardiovascular: negative ?Gastrointestinal: positive for diarrhea and dyspepsia ?Genitourinary:negative ?Integument/breast: negative ?Hematologic/lymphatic: negative ?Musculoskeletal:negative ?Neurological: negative ?Behavioral/Psych: negative ?Endocrine: negative ?Allergic/Immunologic: negative  ? ?PHYSICAL EXAMINATION: General appearance: alert, cooperative, fatigued, and no distress ?Head: Normocephalic, without obvious abnormality, atraumatic ?Neck: no adenopathy, no JVD, supple, symmetrical, trachea midline, and thyroid not enlarged, symmetric, no tenderness/mass/nodules ?Lymph nodes: Cervical, supraclavicular, and axillary nodes normal. ?Resp: clear to auscultation  bilaterally ?Back: symmetric, no curvature. ROM normal. No CVA tenderness. ?Cardio: regular rate and rhythm, S1, S2 normal, no murmur, click, rub or gallop ?GI: soft, non-tender; bowel sounds normal; no masses,  no organomegaly ?Extremities: extremities normal, atraumatic, no cyanosis or edema ?Neurologic: Alert and oriented X 3, normal strength and tone. Normal symmetric reflexes. Normal coordination and gait  ? ?ECOG PERFORMANCE STATUS: 1 - Symptomatic but completely ambulatory ? ?Blood pressure (!) 150/68, pulse 86, temperature 98 ?F (36.7 ?C), temperature source Tympanic, weight 138 lb (62.6 kg), SpO2 100 %.  ? ?LABORATORY DATA: ?Lab Results  ?Component Value Date  ? WBC 6.5 07/15/2021  ? HGB 10.0 (L) 07/15/2021  ? HCT 30.6 (L) 07/15/2021  ? MCV 97.5 07/15/2021  ? PLT 239 07/15/2021  ? ? ?  Chemistry   ?   ?Component Value Date/Time  ? NA 133 (L) 07/15/2021 1031  ? K 4.4 07/15/2021 1031  ? CL 98 07/15/2021 1031  ? CO2 28 07/15/2021 1031  ? BUN 19 07/15/2021 1031  ? CREATININE 0.85 07/15/2021 1031  ?    ?Component Value Date/Time  ? CALCIUM 9.4 07/15/2021 1031  ? ALKPHOS 54 07/15/2021 1031  ? AST 22 07/15/2021 1031  ? ALT 16 07/15/2021 1031  ? BILITOT 0.4 07/15/2021 1031  ?  ? ? ? ?RADIOGRAPHIC STUDIES: ?CT Chest Wo Contrast ? ?Result Date: 07/15/2021 ?CLINICAL DATA:  Non-small-cell lung cancer. Distal paraesophageal node on prior study. EXAM: CT CHEST WITHOUT CONTRAST TECHNIQUE: Multidetector CT imaging of the chest was performed   following the standard protocol without IV contrast. RADIATION DOSE REDUCTION: This exam was performed according to the departmental dose-optimization program which includes automated exposure control, adjustment of the mA and/or kV according to patient size and/or use of iterative reconstruction technique. COMPARISON:  06/01/2021 FINDINGS: Cardiovascular: The heart size is normal. No substantial pericardial effusion. Coronary artery calcification is evident. Mild atherosclerotic  calcification is noted in the wall of the thoracic aorta. Mediastinum/Nodes: No mediastinal lymphadenopathy. The index high right paratracheal node measured on multiple prior studies at 8 mm remains stable. No evidenc

## 2021-07-16 ENCOUNTER — Other Ambulatory Visit (HOSPITAL_COMMUNITY): Payer: Self-pay

## 2021-07-16 ENCOUNTER — Other Ambulatory Visit: Payer: Self-pay | Admitting: Physician Assistant

## 2021-07-16 DIAGNOSIS — C3492 Malignant neoplasm of unspecified part of left bronchus or lung: Secondary | ICD-10-CM

## 2021-07-19 ENCOUNTER — Other Ambulatory Visit (HOSPITAL_COMMUNITY): Payer: Self-pay

## 2021-07-19 MED ORDER — OSIMERTINIB MESYLATE 80 MG PO TABS
ORAL_TABLET | Freq: Every day | ORAL | 1 refills | Status: DC
  Filled 2021-07-19: qty 30, 30d supply, fill #0
  Filled 2021-08-18: qty 30, 30d supply, fill #1
  Filled 2021-09-28: qty 30, 30d supply, fill #2
  Filled 2021-10-28: qty 30, 30d supply, fill #3
  Filled 2021-11-23: qty 30, 30d supply, fill #4
  Filled 2021-12-23: qty 30, 30d supply, fill #5

## 2021-08-02 ENCOUNTER — Other Ambulatory Visit (HOSPITAL_COMMUNITY): Payer: Self-pay

## 2021-08-16 ENCOUNTER — Other Ambulatory Visit (HOSPITAL_COMMUNITY): Payer: Self-pay

## 2021-08-18 ENCOUNTER — Other Ambulatory Visit (HOSPITAL_COMMUNITY): Payer: Self-pay

## 2021-09-06 ENCOUNTER — Other Ambulatory Visit (HOSPITAL_COMMUNITY): Payer: Self-pay

## 2021-09-07 ENCOUNTER — Encounter: Payer: Self-pay | Admitting: Internal Medicine

## 2021-09-07 ENCOUNTER — Other Ambulatory Visit: Payer: Self-pay

## 2021-09-07 ENCOUNTER — Inpatient Hospital Stay: Payer: Medicare Other

## 2021-09-07 ENCOUNTER — Inpatient Hospital Stay: Payer: Medicare Other | Attending: Internal Medicine | Admitting: Internal Medicine

## 2021-09-07 VITALS — BP 161/57 | HR 72 | Temp 98.2°F | Resp 16 | Wt 139.2 lb

## 2021-09-07 DIAGNOSIS — Z79899 Other long term (current) drug therapy: Secondary | ICD-10-CM | POA: Insufficient documentation

## 2021-09-07 DIAGNOSIS — R21 Rash and other nonspecific skin eruption: Secondary | ICD-10-CM | POA: Diagnosis not present

## 2021-09-07 DIAGNOSIS — D649 Anemia, unspecified: Secondary | ICD-10-CM | POA: Diagnosis not present

## 2021-09-07 DIAGNOSIS — R5383 Other fatigue: Secondary | ICD-10-CM | POA: Insufficient documentation

## 2021-09-07 DIAGNOSIS — C3490 Malignant neoplasm of unspecified part of unspecified bronchus or lung: Secondary | ICD-10-CM | POA: Diagnosis not present

## 2021-09-07 DIAGNOSIS — C3432 Malignant neoplasm of lower lobe, left bronchus or lung: Secondary | ICD-10-CM | POA: Insufficient documentation

## 2021-09-07 DIAGNOSIS — K219 Gastro-esophageal reflux disease without esophagitis: Secondary | ICD-10-CM | POA: Insufficient documentation

## 2021-09-07 DIAGNOSIS — C3492 Malignant neoplasm of unspecified part of left bronchus or lung: Secondary | ICD-10-CM

## 2021-09-07 LAB — CMP (CANCER CENTER ONLY)
ALT: 14 U/L (ref 0–44)
AST: 21 U/L (ref 15–41)
Albumin: 4.3 g/dL (ref 3.5–5.0)
Alkaline Phosphatase: 55 U/L (ref 38–126)
Anion gap: 5 (ref 5–15)
BUN: 17 mg/dL (ref 8–23)
CO2: 31 mmol/L (ref 22–32)
Calcium: 9.2 mg/dL (ref 8.9–10.3)
Chloride: 99 mmol/L (ref 98–111)
Creatinine: 0.91 mg/dL (ref 0.44–1.00)
GFR, Estimated: >60 mL/min (ref >60)
Glucose, Bld: 105 mg/dL — ABNORMAL HIGH (ref 70–99)
Potassium: 3.9 mmol/L (ref 3.5–5.1)
Sodium: 135 mmol/L (ref 135–145)
Total Bilirubin: 0.3 mg/dL (ref 0.3–1.2)
Total Protein: 6.9 g/dL (ref 6.5–8.1)

## 2021-09-07 LAB — CBC WITH DIFFERENTIAL (CANCER CENTER ONLY)
Abs Immature Granulocytes: 0.01 10*3/uL (ref 0.00–0.07)
Basophils Absolute: 0 10*3/uL (ref 0.0–0.1)
Basophils Relative: 1 %
Eosinophils Absolute: 0.1 10*3/uL (ref 0.0–0.5)
Eosinophils Relative: 2 %
HCT: 30.3 % — ABNORMAL LOW (ref 36.0–46.0)
Hemoglobin: 10 g/dL — ABNORMAL LOW (ref 12.0–15.0)
Immature Granulocytes: 0 %
Lymphocytes Relative: 41 %
Lymphs Abs: 2.5 10*3/uL (ref 0.7–4.0)
MCH: 31.6 pg (ref 26.0–34.0)
MCHC: 33 g/dL (ref 30.0–36.0)
MCV: 95.9 fL (ref 80.0–100.0)
Monocytes Absolute: 0.5 10*3/uL (ref 0.1–1.0)
Monocytes Relative: 9 %
Neutro Abs: 2.9 10*3/uL (ref 1.7–7.7)
Neutrophils Relative %: 47 %
Platelet Count: 247 10*3/uL (ref 150–400)
RBC: 3.16 MIL/uL — ABNORMAL LOW (ref 3.87–5.11)
RDW: 12.6 % (ref 11.5–15.5)
WBC Count: 6 10*3/uL (ref 4.0–10.5)
nRBC: 0 % (ref 0.0–0.2)

## 2021-09-07 NOTE — Progress Notes (Signed)
[place] [phone]   [phone]  OFFICE PROGRESS NOTE  Donald Prose, MD [place] [address]  DIAGNOSIS: Recurrent non-small cell lung cancer initially diagnosed as stage IIA (T2b, N0, M0) non-small cell lung cancer, adenocarcinoma diagnosed in November 2019  Molecular studies showed EGFR mutation in exon 21 (L858R) PD-L1 expression 0  PRIOR THERAPY: status post left lower lobectomy with lymph node dissection on April 25, 2018 under the care of Dr. Roxan Hockey.  She declined adjuvant therapy  CURRENT THERAPY: Osimertinib (Tagrisso) 80 mg p.o. daily.  First dose was on June 19, 2019.  Status post 27 months of treatment.  INTERVAL HISTORY: [name] 71 y.o. female returns to the clinic today for follow-up visit.  The patient was accompanied by her husband.  She is feeling much better today except for the baseline fatigue.  She also has mild skin rash and dyspepsia.  She denied having any chest pain, shortness of breath, cough or hemoptysis.  She denied having any fever or chills.  She has no nausea, vomiting, diarrhea or constipation.  She had some time vacationing at [place] and everyone got sick while were on the vacation.  The patient also got sick about 1 week after returning home.  She was treated with doxycycline and felt much better.  She is here today for evaluation and repeat blood work.   MEDICAL HISTORY: Past Medical History:  Diagnosis Date   Anemia    Atherosclerosis    GERD (gastroesophageal reflux disease)    Hemoptysis    Hyperlipidemia    Hypertension    IBS (irritable bowel syndrome)    Lung mass    Migraines    Spondylosis    Vitamin D deficiency     ALLERGIES:  is allergic to avocado, influenza vaccines, lisinopril, metoprolol, nexium [esomeprazole magnesium], penicillins, pork-derived products, proton pump inhibitors, sulfites, wasp venom protein, zantac [ranitidine hcl], cefaclor,  chlorhexidine, chloroxine, ciprofloxacin hcl, clarithromycin, gatifloxacin, other, sulfa antibiotics, sulfamethoxazole-trimethoprim, dog epithelium allergy skin test, eggs or egg-derived products, sulfur, aspartame, chocolate, dilaudid [hydromorphone hcl], fentanyl, ivp dye [iodinated contrast media], ketorolac, milk-related compounds, molds & smuts, oxycodone, prednisolone, shellfish allergy, soap, and tilactase.  MEDICATIONS:  Current Outpatient Medications  Medication Sig Dispense Refill   bismuth subsalicylate (PEPTO BISMOL) 262 MG/15ML suspension Take 30 mLs by mouth every 6 (six) hours as needed.     Carboxymethylcellul-Glycerin (LUBRICATING EYE DROPS OP) Place 1 drop into both eyes daily as needed (dry eyes).     Cholecalciferol (VITAMIN D3) LIQD Take 2,000 Units by mouth daily.     Cyanocobalamin (VITAMIN B-12) 1000 MCG SUBL Place 1 tablet under the tongue once a week.     diltiazem (CARDIZEM CD) 120 MG 24 hr capsule Take 120 mg by mouth daily.     EPINEPHrine 0.3 mg/0.3 mL IJ SOAJ injection Inject 0.3 mg into the muscle as needed for anaphylaxis.     famotidine (PEPCID) 40 MG tablet Take 40 mg by mouth daily.     Multiple Vitamin (MULTIVITAMIN) tablet Take 1 tablet by mouth daily.     Omega 3-6-9 Fatty Acids (OMEGA-3 FUSION) LIQD Take by mouth.     osimertinib mesylate (TAGRISSO) 80 MG tablet TAKE 1 TABLET (80 MG TOTAL) BY MOUTH DAILY. 90 tablet 1   OVER THE COUNTER MEDICATION Triamcinolone, urea and menthol and camphor     OVER THE COUNTER MEDICATION Iron gummies.     PRESCRIPTION MEDICATION Apply 1  application topically 2 (two) times daily as needed for itching. Dr Molinda Bailiff 2019     rizatriptan (MAXALT) 10 MG tablet Take 5 mg by mouth as needed for migraine. May repeat in 2 hours if needed     No current facility-administered medications for this visit.    SURGICAL HISTORY:  Past Surgical History:  Procedure Laterality Date   BROW LIFT AND BLEPHAROPLASTY  03/15/2013   CATARACT  EXTRACTION, BILATERAL  06/23/2014   CHEST TUBE INSERTION Left 05/10/2018   Procedure: CHEST TUBE INSERTION;  Surgeon: [name], MD;  Location: [place];  Service: Thoracic;  Laterality: Left;   COLONOSCOPY  05/2014   ESOPHAGOGASTRODUODENOSCOPY  2019   EYE SURGERY     IR THORACENTESIS ASP PLEURAL SPACE W/IMG GUIDE  05/24/2018   IR THORACENTESIS ASP PLEURAL SPACE W/IMG GUIDE  06/14/2018   TONSILLECTOMY     VIDEO ASSISTED THORACOSCOPY (VATS)/ LOBECTOMY Left 04/25/2018   Procedure: VIDEO ASSISTED THORACOSCOPY (VATS)LEFT LOWER LOBECTOMY, NODE DISSECTION;  Surgeon: [name], MD;  Location: [place];  Service: Thoracic;  Laterality: Left;   VIDEO BRONCHOSCOPY WITH ENDOBRONCHIAL NAVIGATION N/A 03/07/2018   Procedure: VIDEO BRONCHOSCOPY WITH ENDOBRONCHIAL NAVIGATION;  Surgeon: Collene Gobble, MD;  Location: [facility];  Service: Thoracic;  Laterality: N/A;   VIDEO BRONCHOSCOPY WITH INSERTION OF INTERBRONCHIAL VALVE (IBV) N/A 05/10/2018   Procedure: VIDEO BRONCHOSCOPY WITH INSERTION OF INTERBRONCHIAL VALVE (IBV);  Surgeon: [name], MD;  Location: [facility] OR;  Service: Thoracic;  Laterality: N/A;   VIDEO BRONCHOSCOPY WITH INSERTION OF INTERBRONCHIAL VALVE (IBV) N/A 06/13/2018   Procedure: VIDEO BRONCHOSCOPY WITH REMOVAL OF INTERBRONCHIAL VALVE (IBV) x 2;  Surgeon: [name], MD;  Location: [place];  Service: Thoracic;  Laterality: N/A;    REVIEW OF SYSTEMS:  Constitutional: positive for fatigue Eyes: negative Ears, nose, mouth, throat, and face: negative Respiratory: negative Cardiovascular: negative Gastrointestinal: positive for dyspepsia Genitourinary:negative Integument/breast: negative Hematologic/lymphatic: negative Musculoskeletal:negative Neurological: negative Behavioral/Psych: negative Endocrine: negative Allergic/Immunologic: negative   PHYSICAL EXAMINATION: General appearance: alert, cooperative, fatigued, and no distress Head: Normocephalic, without  obvious abnormality, atraumatic Neck: no adenopathy, no JVD, supple, symmetrical, trachea midline, and thyroid not enlarged, symmetric, no tenderness/mass/nodules Lymph nodes: Cervical, supraclavicular, and axillary nodes normal. Resp: clear to auscultation bilaterally Back: symmetric, no curvature. ROM normal. No CVA tenderness. Cardio: regular rate and rhythm, S1, S2 normal, no murmur, click, rub or gallop GI: soft, non-tender; bowel sounds normal; no masses,  no organomegaly Extremities: extremities normal, atraumatic, no cyanosis or edema Neurologic: Alert and oriented X 3, normal strength and tone. Normal symmetric reflexes. Normal coordination and gait   ECOG PERFORMANCE STATUS: 1 - Symptomatic but completely ambulatory  Blood pressure (!) 161/57, pulse 72, temperature 98.2 F (36.8 C), temperature source Tympanic, resp. rate 16, weight 139 lb 3.2 oz (63.1 kg), SpO2 98 %.   LABORATORY DATA: Lab Results  Component Value Date   WBC 6.0 09/07/2021   HGB 10.0 (L) 09/07/2021   HCT 30.3 (L) 09/07/2021   MCV 95.9 09/07/2021   PLT 247 09/07/2021      Chemistry      Component Value Date/Time   NA 135 09/07/2021 1445   K 3.9 09/07/2021 1445   CL 99 09/07/2021 1445   CO2 31 09/07/2021 1445   BUN 17 09/07/2021 1445   CREATININE 0.91 09/07/2021 1445      Component Value Date/Time   CALCIUM 9.2 09/07/2021 1445   ALKPHOS 55 09/07/2021 1445   AST 21 09/07/2021 1445  ALT 14 09/07/2021 1445   BILITOT 0.3 09/07/2021 1445       RADIOGRAPHIC STUDIES: No results found.  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 Roxan Hockey. The patient is currently on observation.  She declined to proceed with adjuvant systemic chemotherapy or targeted therapy. The patient has been in  observation since her surgical resection. Recent CT scan of the chest followed by PET scan showed suspicious pleural-based mass and nodularity in the left lung concerning for disease recurrence.  Her disease recurrence was confirmed with repeat biopsy of the pleural-based left lung mass. We had molecular studies on the new biopsy and it was consistent with EGFR mutation exon 21 (L858R). The patient started treatment with Tagrisso 80 mg p.o. daily status post almost 27 months of treatment.  She has been tolerating her treatment with Tagrisso fairly well with no concerning adverse effects except for occasional diarrhea. I recommended for the patient to continue her current treatment with Tagrisso with the same dose. For the anemia she will continue on the oral iron tablets for now. For the dyspepsia she is on Pepcid.  She takes it 1 time a day but she also can increase it to twice a day if needed. The patient has a plan for another vacation coming in 2 months with a long time cruise. I will see her back for follow-up visit in early August 2023 with repeat CT scan of the chest for restaging of her disease. The patient was advised to call immediately if she has any other concerning symptoms in the interval.  The patient voices understanding of current disease status and treatment options and is in agreement with the current care plan. All questions were answered. The patient knows to call the clinic with any problems, questions or concerns. We can certainly see the patient much sooner if necessary.  Disclaimer: This note was dictated with voice recognition software. Similar sounding words can inadvertently be transcribed and may not be corrected upon review.

## 2021-09-10 ENCOUNTER — Other Ambulatory Visit (HOSPITAL_COMMUNITY): Payer: Self-pay

## 2021-09-27 ENCOUNTER — Other Ambulatory Visit (HOSPITAL_COMMUNITY): Payer: Self-pay

## 2021-09-28 ENCOUNTER — Other Ambulatory Visit (HOSPITAL_COMMUNITY): Payer: Self-pay

## 2021-09-30 ENCOUNTER — Other Ambulatory Visit (HOSPITAL_COMMUNITY): Payer: Self-pay

## 2021-10-04 ENCOUNTER — Other Ambulatory Visit (HOSPITAL_COMMUNITY): Payer: Self-pay

## 2021-10-28 ENCOUNTER — Other Ambulatory Visit (HOSPITAL_COMMUNITY): Payer: Self-pay

## 2021-11-01 ENCOUNTER — Other Ambulatory Visit (HOSPITAL_COMMUNITY): Payer: Self-pay

## 2021-11-08 ENCOUNTER — Other Ambulatory Visit: Payer: Self-pay | Admitting: Thoracic Surgery (Cardiothoracic Vascular Surgery)

## 2021-11-08 DIAGNOSIS — C3490 Malignant neoplasm of unspecified part of unspecified bronchus or lung: Secondary | ICD-10-CM

## 2021-11-09 ENCOUNTER — Inpatient Hospital Stay: Payer: Medicare Other | Attending: Internal Medicine

## 2021-11-09 ENCOUNTER — Ambulatory Visit (INDEPENDENT_AMBULATORY_CARE_PROVIDER_SITE_OTHER): Payer: Medicare Other | Admitting: Thoracic Surgery (Cardiothoracic Vascular Surgery)

## 2021-11-09 ENCOUNTER — Inpatient Hospital Stay (HOSPITAL_BASED_OUTPATIENT_CLINIC_OR_DEPARTMENT_OTHER): Payer: Medicare Other | Admitting: Internal Medicine

## 2021-11-09 ENCOUNTER — Other Ambulatory Visit: Payer: Self-pay

## 2021-11-09 ENCOUNTER — Ambulatory Visit (HOSPITAL_COMMUNITY)
Admission: RE | Admit: 2021-11-09 | Discharge: 2021-11-09 | Disposition: A | Discharge status: Home / Self Care | Payer: Medicare Other | Source: Ambulatory Visit | Attending: Internal Medicine | Admitting: Internal Medicine

## 2021-11-09 VITALS — BP 180/80 | HR 85 | Resp 20 | Ht 64.0 in | Wt 139.0 lb

## 2021-11-09 VITALS — BP 168/65 | HR 85 | Temp 98.1°F | Resp 16 | Wt 139.8 lb

## 2021-11-09 DIAGNOSIS — R21 Rash and other nonspecific skin eruption: Secondary | ICD-10-CM | POA: Insufficient documentation

## 2021-11-09 DIAGNOSIS — C3432 Malignant neoplasm of lower lobe, left bronchus or lung: Secondary | ICD-10-CM

## 2021-11-09 DIAGNOSIS — K219 Gastro-esophageal reflux disease without esophagitis: Secondary | ICD-10-CM | POA: Diagnosis not present

## 2021-11-09 DIAGNOSIS — R5383 Other fatigue: Secondary | ICD-10-CM | POA: Insufficient documentation

## 2021-11-09 DIAGNOSIS — C3490 Malignant neoplasm of unspecified part of unspecified bronchus or lung: Secondary | ICD-10-CM | POA: Insufficient documentation

## 2021-11-09 DIAGNOSIS — Z902 Acquired absence of lung [part of]: Secondary | ICD-10-CM | POA: Diagnosis not present

## 2021-11-09 DIAGNOSIS — D649 Anemia, unspecified: Secondary | ICD-10-CM | POA: Insufficient documentation

## 2021-11-09 DIAGNOSIS — Z79899 Other long term (current) drug therapy: Secondary | ICD-10-CM | POA: Diagnosis not present

## 2021-11-09 DIAGNOSIS — R197 Diarrhea, unspecified: Secondary | ICD-10-CM | POA: Diagnosis not present

## 2021-11-09 LAB — CBC WITH DIFFERENTIAL (CANCER CENTER ONLY)
Abs Immature Granulocytes: 0.02 10*3/uL (ref 0.00–0.07)
Basophils Absolute: 0 10*3/uL (ref 0.0–0.1)
Basophils Relative: 0 %
Eosinophils Absolute: 0.1 10*3/uL (ref 0.0–0.5)
Eosinophils Relative: 1 %
HCT: 29.8 % — ABNORMAL LOW (ref 36.0–46.0)
Hemoglobin: 9.9 g/dL — ABNORMAL LOW (ref 12.0–15.0)
Immature Granulocytes: 0 %
Lymphocytes Relative: 35 %
Lymphs Abs: 2.1 10*3/uL (ref 0.7–4.0)
MCH: 32 pg (ref 26.0–34.0)
MCHC: 33.2 g/dL (ref 30.0–36.0)
MCV: 96.4 fL (ref 80.0–100.0)
Monocytes Absolute: 0.7 10*3/uL (ref 0.1–1.0)
Monocytes Relative: 11 %
Neutro Abs: 3.2 10*3/uL (ref 1.7–7.7)
Neutrophils Relative %: 53 %
Platelet Count: 232 10*3/uL (ref 150–400)
RBC: 3.09 MIL/uL — ABNORMAL LOW (ref 3.87–5.11)
RDW: 13.2 % (ref 11.5–15.5)
WBC Count: 6.1 10*3/uL (ref 4.0–10.5)
nRBC: 0 % (ref 0.0–0.2)

## 2021-11-09 LAB — CMP (CANCER CENTER ONLY)
ALT: 13 U/L (ref 0–44)
AST: 19 U/L (ref 15–41)
Albumin: 4.3 g/dL (ref 3.5–5.0)
Alkaline Phosphatase: 60 U/L (ref 38–126)
Anion gap: 5 (ref 5–15)
BUN: 14 mg/dL (ref 8–23)
CO2: 29 mmol/L (ref 22–32)
Calcium: 9.1 mg/dL (ref 8.9–10.3)
Chloride: 99 mmol/L (ref 98–111)
Creatinine: 0.84 mg/dL (ref 0.44–1.00)
GFR, Estimated: >60 mL/min (ref >60)
Glucose, Bld: 96 mg/dL (ref 70–99)
Potassium: 4.2 mmol/L (ref 3.5–5.1)
Sodium: 133 mmol/L — ABNORMAL LOW (ref 135–145)
Total Bilirubin: 0.4 mg/dL (ref 0.3–1.2)
Total Protein: 6.4 g/dL — ABNORMAL LOW (ref 6.5–8.1)

## 2021-11-09 NOTE — Progress Notes (Signed)
[address]       [place] [address]             [phone]    HPI: [name] returns for a scheduled follow-up visit regarding her lung cancer.  [name] is a 71 year old woman with a history of adenocarcinoma of the lung, hypertension, hyperlipidemia, reflux, aortic atherosclerosis, migraines, anemia, and medication allergies.  She had a left lower lobectomy for stage IIa adenocarcinoma in 2020.  She had a difficult postoperative course and elected not to do adjuvant therapy.  She was diagnosed with recurrence in 2021.  The tumor was EGFR positive and she has been on Tagrisso since February 2021.  Last saw in the office in March.  She was doing well at that time.  In the interim since her last visit she has been feeling well.  She has only some very mild side effects with Tagrisso.  Some shortness of breath with heavy exertion but otherwise feels well.  Past Medical History:  Diagnosis Date   Anemia    Atherosclerosis    GERD (gastroesophageal reflux disease)    Hemoptysis    Hyperlipidemia    Hypertension    IBS (irritable bowel syndrome)    Lung mass    Migraines    Spondylosis    Vitamin D deficiency     Current Outpatient Medications  Medication Sig Dispense Refill   Carboxymethylcellul-Glycerin (LUBRICATING EYE DROPS OP) Place 1 drop into both eyes daily as needed (dry eyes).     Cholecalciferol (VITAMIN D3) LIQD Take 2,000 Units by mouth daily.     Cyanocobalamin (VITAMIN B-12) 1000 MCG SUBL Place 1 tablet under the tongue once a week.     diltiazem (CARDIZEM CD) 120 MG 24 hr capsule Take 120 mg by mouth daily.     EPINEPHrine 0.3 mg/0.3 mL IJ SOAJ injection Inject 0.3 mg into the muscle as needed for anaphylaxis.     famotidine (PEPCID) 40 MG tablet Take 40 mg by mouth daily.     Multiple Vitamin (MULTIVITAMIN) tablet Take 1 tablet by mouth daily.     Omega 3-6-9 Fatty Acids (OMEGA-3 FUSION) LIQD Take by mouth.     osimertinib mesylate  (TAGRISSO) 80 MG tablet TAKE 1 TABLET (80 MG TOTAL) BY MOUTH DAILY. 90 tablet 1   OVER THE COUNTER MEDICATION Triamcinolone, urea and menthol and camphor     OVER THE COUNTER MEDICATION Iron gummies.     PRESCRIPTION MEDICATION Apply 1 application topically 2 (two) times daily as needed for itching. Dr Molinda Bailiff 2019     rizatriptan (MAXALT) 10 MG tablet Take 5 mg by mouth as needed for migraine. May repeat in 2 hours if needed     bismuth subsalicylate (PEPTO BISMOL) 262 MG/15ML suspension Take 30 mLs by mouth every 6 (six) hours as needed.     No current facility-administered medications for this visit.    Physical Exam BP (!) 180/80   Pulse 85   Resp 20   Ht $R'5\' 4"'YO$  (1.626 m)   Wt 139 lb (63 kg)   SpO2 97% Comment: RA  BMI 23.86 kg/m  Well-appearing 71 year old woman in no acute distress Alert and oriented x3 with no focal deficits Lungs slightly diminished at left base but otherwise clear Cardiac regular rate and rhythm  Diagnostic Tests: CT CHEST WITHOUT CONTRAST   TECHNIQUE: Multidetector CT imaging of the chest was performed following the standard protocol without IV contrast.   RADIATION DOSE  REDUCTION: This exam was performed according to the departmental dose-optimization program which includes automated exposure control, adjustment of the mA and/or kV according to patient size and/or use of iterative reconstruction technique.   COMPARISON:  07/15/2021   FINDINGS: Cardiovascular: The heart size is normal. No substantial pericardial effusion. Coronary artery calcification is evident. Mild atherosclerotic calcification is noted in the wall of the thoracic aorta.   Mediastinum/Nodes: Index high right paratracheal lymph node is stable at 8 mm (28/2). No mediastinal lymphadenopathy. No evidence for gross hilar lymphadenopathy although assessment is limited by the lack of intravenous contrast on the current study. The esophagus has normal imaging features. 6 mm low  paraesophageal node seen on the previous study is stable today on image 111/2. A second lymph node previously seen in the region of the proximal stomach, below the hiatus is also stable on image 114/2. There is no axillary lymphadenopathy.   Lungs/Pleura: Volume loss left hemithorax again noted consistent with history of left lower lobectomy. No new suspicious pulmonary nodule or mass. No focal airspace consolidation. No pleural effusion.   The plaque-like areas of left pleural thickening monitored over multiple previous study show evidence of interval progression.   13 x 7 mm left paraspinal pleural based nodule on image 46/2 has increased in the interval. This finding was not really nodular on the prior study, having a more plaque-like appearance, and measuring 8 x 3 mm.   Focal pleural thickening described on the previous study in the posteromedial left hemithorax also appears minimally increased measuring 5 mm thickness today (image 60/2 today) compared to 1-2 mm previously (remeasured).   A third area of nodular pleural thickening in the paraspinal left hemithorax (image 70/2 today) measures 8 x 12 mm compared to 4 x 5 mm previously (remeasured).   A fourth focus of soft tissue pleural nodularity is seen on image 74/2, measuring 6 x 7 mm today. This represented only a tiny focus of focal plaque-like pleural thickening previously at about 2 mm thickness.   No evidence for pneumothorax.  No substantial pleural effusion.   Upper Abdomen: Unremarkable.  No adrenal nodule or mass.   Musculoskeletal: No worrisome lytic or sclerotic osseous abnormality.   IMPRESSION: 1. Interval progression of the plaque-like areas of left pleural thickening more nodular seen on previous imaging, now with more nodular characteristics. Imaging features are highly concerning for recurrent disease. 2. Stable appearance of the small low paraesophageal and proximal gastric lymph nodes. No new  lymphadenopathy on today's study. 3. Aortic Atherosclerosis (ICD10-I70.0).     Electronically Signed   By: [name] M.D.   On: 11/09/2021 14:34 I personally reviewed the CT images and concur with the findings noted above.  Impression: [name] is a 71 year old woman with a history of adenocarcinoma of the lung, hypertension, hyperlipidemia, reflux, aortic atherosclerosis, migraines, anemia, and medication allergies.  She had a left lower lobectomy for stage IIa adenocarcinoma in 2020.  She did not want to do adjuvant therapy and had a recurrence about a year later.  She was EGFR positive and was started on Tagrisso.  Her CT today shows some small areas of pleural thickening and nodularity that have progressed since her last scan.  Given her history, this is obviously concerning for recurrent disease.  She saw Dr. [name] earlier today and he recommended a PET/CT.  I concur with that opinion.  Depending on the PET results this may be limited enough that it can be treated with focused radiation.  Plan: Proceed with PET/CT and follow-up with Dr. [name] I will be happy to weigh in once the PET/CT results are available.  [name], MD [facility] [phone]

## 2021-11-09 NOTE — Progress Notes (Signed)
[place] [phone]   [phone]  OFFICE PROGRESS NOTE  Donald Prose, MD [place] [address]  DIAGNOSIS: Recurrent non-small cell lung cancer initially diagnosed as stage IIA (T2b, N0, M0) non-small cell lung cancer, adenocarcinoma diagnosed in November 2019  Molecular studies showed EGFR mutation in exon 21 (L858R) PD-L1 expression 0  PRIOR THERAPY: status post left lower lobectomy with lymph node dissection on April 25, 2018 under the care of Dr. Roxan Hockey.  She declined adjuvant therapy  CURRENT THERAPY: Osimertinib (Tagrisso) 80 mg p.o. daily.  First dose was on June 19, 2019.  Status post 29 months of treatment.  INTERVAL HISTORY: [name] 71 y.o. female returns to the clinic today for follow-up visit accompanied by her husband.  The patient is feeling fine today with no concerning complaints except for anxiety after reading her scan results.  Her blood pressure is also elevated.  She denied having any current chest pain, shortness of breath except with exertion with no cough or hemoptysis.  She has no nausea, vomiting but occasional diarrhea with no constipation.  She has no fever or chills.  She has been tolerating her treatment with Tagrisso fairly well.  She enjoyed her vacation time with close to [place] and [place].  She is here today for evaluation with repeat CT scan of the chest for restaging of her disease.   MEDICAL HISTORY: Past Medical History:  Diagnosis Date   Anemia    Atherosclerosis    GERD (gastroesophageal reflux disease)    Hemoptysis    Hyperlipidemia    Hypertension    IBS (irritable bowel syndrome)    Lung mass    Migraines    Spondylosis    Vitamin D deficiency     ALLERGIES:  is allergic to avocado, influenza vaccines, lisinopril, metoprolol, nexium [esomeprazole magnesium], penicillins, pork-derived products, proton pump inhibitors, sulfites, wasp venom protein,  zantac [ranitidine hcl], cefaclor, chlorhexidine, chloroxine, ciprofloxacin hcl, clarithromycin, gatifloxacin, other, sulfa antibiotics, sulfamethoxazole-trimethoprim, buckwheat, cat hair extract, dog epithelium allergy skin test, eggs or egg-derived products, fluogen [influenza virus vaccine], pollen extract, short ragweed pollen ext, sulfur, aspartame, chocolate, dilaudid [hydromorphone hcl], fentanyl, ivp dye [iodinated contrast media], ketorolac, milk-related compounds, molds & smuts, oxycodone, prednisolone, shellfish allergy, soap, and tilactase.  MEDICATIONS:  Current Outpatient Medications  Medication Sig Dispense Refill   bismuth subsalicylate (PEPTO BISMOL) 262 MG/15ML suspension Take 30 mLs by mouth every 6 (six) hours as needed.     Carboxymethylcellul-Glycerin (LUBRICATING EYE DROPS OP) Place 1 drop into both eyes daily as needed (dry eyes).     Cholecalciferol (VITAMIN D3) LIQD Take 2,000 Units by mouth daily.     Cyanocobalamin (VITAMIN B-12) 1000 MCG SUBL Place 1 tablet under the tongue once a week.     diltiazem (CARDIZEM CD) 120 MG 24 hr capsule Take 120 mg by mouth daily.     EPINEPHrine 0.3 mg/0.3 mL IJ SOAJ injection Inject 0.3 mg into the muscle as needed for anaphylaxis.     famotidine (PEPCID) 40 MG tablet Take 40 mg by mouth daily.     Multiple Vitamin (MULTIVITAMIN) tablet Take 1 tablet by mouth daily.     Omega 3-6-9 Fatty Acids (OMEGA-3 FUSION) LIQD Take by mouth.     osimertinib mesylate (TAGRISSO) 80 MG tablet TAKE 1 TABLET (80 MG TOTAL) BY MOUTH DAILY. 90 tablet 1   OVER THE COUNTER MEDICATION Triamcinolone, urea and menthol and camphor  OVER THE COUNTER MEDICATION Iron gummies.     PRESCRIPTION MEDICATION Apply 1 application topically 2 (two) times daily as needed for itching. Dr Molinda Bailiff 2019     rizatriptan (MAXALT) 10 MG tablet Take 5 mg by mouth as needed for migraine. May repeat in 2 hours if needed     No current facility-administered medications for this  visit.    SURGICAL HISTORY:  Past Surgical History:  Procedure Laterality Date   BROW LIFT AND BLEPHAROPLASTY  03/15/2013   CATARACT EXTRACTION, BILATERAL  06/23/2014   CHEST TUBE INSERTION Left 05/10/2018   Procedure: CHEST TUBE INSERTION;  Surgeon: [name], MD;  Location: [facility];  Service: Thoracic;  Laterality: Left;   COLONOSCOPY  05/2014   ESOPHAGOGASTRODUODENOSCOPY  2019   EYE SURGERY     IR THORACENTESIS ASP PLEURAL SPACE W/IMG GUIDE  05/24/2018   IR THORACENTESIS ASP PLEURAL SPACE W/IMG GUIDE  06/14/2018   TONSILLECTOMY     VIDEO ASSISTED THORACOSCOPY (VATS)/ LOBECTOMY Left 04/25/2018   Procedure: VIDEO ASSISTED THORACOSCOPY (VATS)LEFT LOWER LOBECTOMY, NODE DISSECTION;  Surgeon: [name], MD;  Location: [facility];  Service: Thoracic;  Laterality: Left;   VIDEO BRONCHOSCOPY WITH ENDOBRONCHIAL NAVIGATION N/A 03/07/2018   Procedure: VIDEO BRONCHOSCOPY WITH ENDOBRONCHIAL NAVIGATION;  Surgeon: Collene Gobble, MD;  Location: [facility] OR;  Service: Thoracic;  Laterality: N/A;   VIDEO BRONCHOSCOPY WITH INSERTION OF INTERBRONCHIAL VALVE (IBV) N/A 05/10/2018   Procedure: VIDEO BRONCHOSCOPY WITH INSERTION OF INTERBRONCHIAL VALVE (IBV);  Surgeon: [name], MD;  Location: [facility] OR;  Service: Thoracic;  Laterality: N/A;   VIDEO BRONCHOSCOPY WITH INSERTION OF INTERBRONCHIAL VALVE (IBV) N/A 06/13/2018   Procedure: VIDEO BRONCHOSCOPY WITH REMOVAL OF INTERBRONCHIAL VALVE (IBV) x 2;  Surgeon: [name], MD;  Location: [place];  Service: Thoracic;  Laterality: N/A;    REVIEW OF SYSTEMS:  Constitutional: positive for fatigue Eyes: negative Ears, nose, mouth, throat, and face: negative Respiratory: negative Cardiovascular: negative Gastrointestinal: positive for dyspepsia Genitourinary:negative Integument/breast: negative Hematologic/lymphatic: negative Musculoskeletal:negative Neurological: negative Behavioral/Psych: negative Endocrine:  negative Allergic/Immunologic: negative   PHYSICAL EXAMINATION: General appearance: alert, cooperative, fatigued, and no distress Head: Normocephalic, without obvious abnormality, atraumatic Neck: no adenopathy, no JVD, supple, symmetrical, trachea midline, and thyroid not enlarged, symmetric, no tenderness/mass/nodules Lymph nodes: Cervical, supraclavicular, and axillary nodes normal. Resp: clear to auscultation bilaterally Back: symmetric, no curvature. ROM normal. No CVA tenderness. Cardio: regular rate and rhythm, S1, S2 normal, no murmur, click, rub or gallop GI: soft, non-tender; bowel sounds normal; no masses,  no organomegaly Extremities: extremities normal, atraumatic, no cyanosis or edema Neurologic: Alert and oriented X 3, normal strength and tone. Normal symmetric reflexes. Normal coordination and gait   ECOG PERFORMANCE STATUS: 1 - Symptomatic but completely ambulatory  Blood pressure (!) 168/65, pulse 85, temperature 98.1 F (36.7 C), temperature source Oral, resp. rate 16, weight 139 lb 12.8 oz (63.4 kg), SpO2 99 %.   LABORATORY DATA: Lab Results  Component Value Date   WBC 6.1 11/09/2021   HGB 9.9 (L) 11/09/2021   HCT 29.8 (L) 11/09/2021   MCV 96.4 11/09/2021   PLT 232 11/09/2021      Chemistry      Component Value Date/Time   NA 133 (L) 11/09/2021 1039   K 4.2 11/09/2021 1039   CL 99 11/09/2021 1039   CO2 29 11/09/2021 1039   BUN 14 11/09/2021 1039   CREATININE 0.84 11/09/2021 1039      Component Value Date/Time   CALCIUM  9.1 11/09/2021 1039   ALKPHOS 60 11/09/2021 1039   AST 19 11/09/2021 1039   ALT 13 11/09/2021 1039   BILITOT 0.4 11/09/2021 1039       RADIOGRAPHIC STUDIES: CT Chest Wo Contrast  Result Date: 11/09/2021 CLINICAL DATA:  Non-small-cell lung cancer.  Restaging. EXAM: CT CHEST WITHOUT CONTRAST TECHNIQUE: Multidetector CT imaging of the chest was performed following the standard protocol without IV contrast. RADIATION DOSE REDUCTION:  This exam was performed according to the departmental dose-optimization program which includes automated exposure control, adjustment of the mA and/or kV according to patient size and/or use of iterative reconstruction technique. COMPARISON:  07/15/2021 FINDINGS: Cardiovascular: The heart size is normal. No substantial pericardial effusion. Coronary artery calcification is evident. Mild atherosclerotic calcification is noted in the wall of the thoracic aorta. Mediastinum/Nodes: Index high right paratracheal lymph node is stable at 8 mm (28/2). No mediastinal lymphadenopathy. No evidence for gross hilar lymphadenopathy although assessment is limited by the lack of intravenous contrast on the current study. The esophagus has normal imaging features. 6 mm low paraesophageal node seen on the previous study is stable today on image 111/2. A second lymph node previously seen in the region of the proximal stomach, below the hiatus is also stable on image 114/2. There is no axillary lymphadenopathy. Lungs/Pleura: Volume loss left hemithorax again noted consistent with history of left lower lobectomy. No new suspicious pulmonary nodule or mass. No focal airspace consolidation. No pleural effusion. The plaque-like areas of left pleural thickening monitored over multiple previous study show evidence of interval progression. 13 x 7 mm left paraspinal pleural based nodule on image 46/2 has increased in the interval. This finding was not really nodular on the prior study, having a more plaque-like appearance, and measuring 8 x 3 mm. Focal pleural thickening described on the previous study in the posteromedial left hemithorax also appears minimally increased measuring 5 mm thickness today (image 60/2 today) compared to 1-2 mm previously (remeasured). A third area of nodular pleural thickening in the paraspinal left hemithorax (image 70/2 today) measures 8 x 12 mm compared to 4 x 5 mm previously (remeasured). A fourth focus of soft  tissue pleural nodularity is seen on image 74/2, measuring 6 x 7 mm today. This represented only a tiny focus of focal plaque-like pleural thickening previously at about 2 mm thickness. No evidence for pneumothorax.  No substantial pleural effusion. Upper Abdomen: Unremarkable.  No adrenal nodule or mass. Musculoskeletal: No worrisome lytic or sclerotic osseous abnormality. IMPRESSION: 1. Interval progression of the plaque-like areas of left pleural thickening more nodular seen on previous imaging, now with more nodular characteristics. Imaging features are highly concerning for recurrent disease. 2. Stable appearance of the small low paraesophageal and proximal gastric lymph nodes. No new lymphadenopathy on today's study. 3. Aortic Atherosclerosis (ICD10-I70.0). Electronically Signed   By: Misty Stanley M.D.   On: 11/09/2021 14:34    ASSESSMENT AND PLAN: This is a very pleasant 71 years old white female with a stage IIa non-small cell lung cancer, adenocarcinoma with positive EGFR mutation in exon 21 (L858R) status post left lower lobectomy with lymph node dissection under the care of Dr. Roxan Hockey. The patient is currently on observation.  She declined to proceed with adjuvant systemic chemotherapy or targeted therapy. The patient has been in observation since her surgical resection. Recent CT scan of the chest followed by PET scan showed suspicious pleural-based mass and nodularity in the left lung concerning for disease recurrence.  Her disease recurrence  was confirmed with repeat biopsy of the pleural-based left lung mass. We had molecular studies on the new biopsy and it was consistent with EGFR mutation exon 21 (L858R). The patient started treatment with Tagrisso 80 mg p.o. daily status post almost 29 months of treatment.  She has been tolerating her treatment with Tagrisso fairly well with no concerning adverse effects except for mild rash and diarrhea. She had repeat CT scan of the chest  performed earlier today.  I personally and independently reviewed the scan images and discussed the result and showed the images to the patient and her husband. There is some interval progression of plaque-like areas in the left pleural area concerning for disease recurrence. I recommended for the patient to have a PET scan for further evaluation of these 2 areas and if the PET scan confirms evidence for disease recurrence, she may benefit from SBRT to this lesion if there is no other metastatic disease. For the anemia she will continue on the oral iron tablets for now. For the dyspepsia she is on Pepcid.  She takes it 1 time a day but she also can increase it to twice a day if needed. I will see her back for follow-up visit in around 2 weeks for evaluation and discussion of her PET scan results and further recommendation regarding her condition. She was advised to call immediately if she has any other concerning symptoms in the interval.  The patient voices understanding of current disease status and treatment options and is in agreement with the current care plan. All questions were answered. The patient knows to call the clinic with any problems, questions or concerns. We can certainly see the patient much sooner if necessary.  Disclaimer: This note was dictated with voice recognition software. Similar sounding words can inadvertently be transcribed and may not be corrected upon review.

## 2021-11-10 ENCOUNTER — Telehealth: Payer: Self-pay | Admitting: Medical Oncology

## 2021-11-10 NOTE — Telephone Encounter (Signed)
MD visit after PET scan. Requests to move appt to 8/7,   [name] wants to see [name] on 8/7 to review PET scan. She said Dr Tery Sanfilippo will read it right after the exam . An afternoon appt requested as PET scan process takes 90 minutes.  She has to drive back to [place].   Please send schedule message if change authorized.

## 2021-11-11 NOTE — Telephone Encounter (Signed)
I spoke with pt and advised as indicated. I also shared with pt that while Dr. [name] is okay with her office visit being on the same day as her PET, he does not have any afternoon appts available. Cassandra, PA-C does have an appt available. Pt agreed to being placed on Cassandra's schedule and understands that Dr. [name] will also present to the appt to discuss her tx options going forward.

## 2021-11-16 ENCOUNTER — Telehealth: Payer: Self-pay | Admitting: Physician Assistant

## 2021-11-16 NOTE — Progress Notes (Signed)
[name] OFFICE PROGRESS NOTE  Donald Prose, MD [place] [address]  DIAGNOSIS: Recurrent non-small cell lung cancer initially diagnosed as stage IIA (T2b, N0, M0) non-small cell lung cancer, adenocarcinoma diagnosed in November 2019  Molecular studies showed EGFR mutation in exon 21 (L858R) PD-L1 expression 0  PRIOR THERAPY: status post left lower lobectomy with lymph node dissection on April 25, 2018 under the care of Dr. Roxan Hockey.  She declined adjuvant therapy  CURRENT THERAPY: Osimertinib (Tagrisso) 80 mg p.o. daily.  First dose was on June 19, 2019.  Status post 29 months of treatment.  INTERVAL HISTORY: [name] 71 y.o. female returns to the clinic today for a follow-up visit accompanied by her husband.  The patient was last seen by Dr. [name] on 11/09/21.  At that point in time, she had a restaging CT scan which showed suspicious progression of plaque-like areas to the left pleura which is concerning for disease recurrence.  Dr. [name] recommended a PET scan to further evaluate this.   Since last being seen, the patient denies any changes in her health.  Denies any fever, chills, or unexplained weight loss.  She sometimes gets night sweats but none recently.  However, this may be environmental as she mentions that she sleeps with a lot of blankets.  Denies any chest pain, cough, or hemoptysis.  She reports baseline mild dyspnea on exertion which she characterizes more as a fluttering sensation.  She reports her symptoms improved with drinking more drinks that contain electrolytes.  She mentions her sodium always runs a little bit low and she can feel the symptoms when her sodium is low.  Denies any nausea, vomiting, or constipation.  She occasionally has diarrhea which may be from [place]; however, she also mentioned she has a lot of food intolerance which may be contributing. She denies any rashes or skin changes.  She denies any headache  or visual changes.  She is here today to review her PET scan results and for a more detailed discussion about her current condition and recommended treatment options.    MEDICAL HISTORY: Past Medical History:  Diagnosis Date   Anemia    Atherosclerosis    GERD (gastroesophageal reflux disease)    Hemoptysis    Hyperlipidemia    Hypertension    IBS (irritable bowel syndrome)    Lung mass    Migraines    Spondylosis    Vitamin D deficiency     ALLERGIES:  is allergic to avocado, influenza vaccines, lisinopril, metoprolol, nexium [esomeprazole magnesium], penicillins, pork-derived products, proton pump inhibitors, sulfites, wasp venom protein, zantac [ranitidine hcl], cefaclor, chlorhexidine, chloroxine, ciprofloxacin hcl, clarithromycin, gatifloxacin, other, sulfa antibiotics, sulfamethoxazole-trimethoprim, buckwheat, cat hair extract, dog epithelium allergy skin test, eggs or egg-derived products, fluogen [influenza virus vaccine], pollen extract, short ragweed pollen ext, sulfur, aspartame, chocolate, dilaudid [hydromorphone hcl], fentanyl, ivp dye [iodinated contrast media], ketorolac, milk-related compounds, molds & smuts, oxycodone, prednisolone, shellfish allergy, soap, and tilactase.  MEDICATIONS:  Current Outpatient Medications  Medication Sig Dispense Refill   bismuth subsalicylate (PEPTO BISMOL) 262 MG/15ML suspension Take 30 mLs by mouth every 6 (six) hours as needed.     Carboxymethylcellul-Glycerin (LUBRICATING EYE DROPS OP) Place 1 drop into both eyes daily as needed (dry eyes).     Cholecalciferol (VITAMIN D3) LIQD Take 2,000 Units by mouth daily.     Cyanocobalamin (VITAMIN B-12) 1000 MCG SUBL Place 1 tablet under the tongue once a week.  diltiazem (CARDIZEM CD) 120 MG 24 hr capsule Take 120 mg by mouth daily.     EPINEPHrine 0.3 mg/0.3 mL IJ SOAJ injection Inject 0.3 mg into the muscle as needed for anaphylaxis.     famotidine (PEPCID) 40 MG tablet Take 40 mg by mouth  daily.     Multiple Vitamin (MULTIVITAMIN) tablet Take 1 tablet by mouth daily.     Omega 3-6-9 Fatty Acids (OMEGA-3 FUSION) LIQD Take by mouth.     osimertinib mesylate (TAGRISSO) 80 MG tablet TAKE 1 TABLET (80 MG TOTAL) BY MOUTH DAILY. 90 tablet 1   OVER THE COUNTER MEDICATION Triamcinolone, urea and menthol and camphor     OVER THE COUNTER MEDICATION Iron gummies.     PRESCRIPTION MEDICATION Apply 1 application topically 2 (two) times daily as needed for itching. Dr Molinda Bailiff 2019     rizatriptan (MAXALT) 10 MG tablet Take 5 mg by mouth as needed for migraine. May repeat in 2 hours if needed     No current facility-administered medications for this visit.    SURGICAL HISTORY:  Past Surgical History:  Procedure Laterality Date   BROW LIFT AND BLEPHAROPLASTY  03/15/2013   CATARACT EXTRACTION, BILATERAL  06/23/2014   CHEST TUBE INSERTION Left 05/10/2018   Procedure: CHEST TUBE INSERTION;  Surgeon: [name], MD;  Location: [facility];  Service: Thoracic;  Laterality: Left;   COLONOSCOPY  05/2014   ESOPHAGOGASTRODUODENOSCOPY  2019   EYE SURGERY     IR THORACENTESIS ASP PLEURAL SPACE W/IMG GUIDE  05/24/2018   IR THORACENTESIS ASP PLEURAL SPACE W/IMG GUIDE  06/14/2018   TONSILLECTOMY     VIDEO ASSISTED THORACOSCOPY (VATS)/ LOBECTOMY Left 04/25/2018   Procedure: VIDEO ASSISTED THORACOSCOPY (VATS)LEFT LOWER LOBECTOMY, NODE DISSECTION;  Surgeon: [name], MD;  Location: [facility];  Service: Thoracic;  Laterality: Left;   VIDEO BRONCHOSCOPY WITH ENDOBRONCHIAL NAVIGATION N/A 03/07/2018   Procedure: VIDEO BRONCHOSCOPY WITH ENDOBRONCHIAL NAVIGATION;  Surgeon: Collene Gobble, MD;  Location: [facility];  Service: Thoracic;  Laterality: N/A;   VIDEO BRONCHOSCOPY WITH INSERTION OF INTERBRONCHIAL VALVE (IBV) N/A 05/10/2018   Procedure: VIDEO BRONCHOSCOPY WITH INSERTION OF INTERBRONCHIAL VALVE (IBV);  Surgeon: [name], MD;  Location: [facility];  Service: Thoracic;  Laterality: N/A;    VIDEO BRONCHOSCOPY WITH INSERTION OF INTERBRONCHIAL VALVE (IBV) N/A 06/13/2018   Procedure: VIDEO BRONCHOSCOPY WITH REMOVAL OF INTERBRONCHIAL VALVE (IBV) x 2;  Surgeon: [name], MD;  Location: [facility];  Service: Thoracic;  Laterality: N/A;    REVIEW OF SYSTEMS:   Review of Systems  Constitutional: Negative for appetite change, chills, fatigue, fever and unexpected weight change.  HENT: Negative for mouth sores, nosebleeds, sore throat and trouble swallowing.   Eyes: Negative for eye problems and icterus.  Respiratory: Positive for occasional fluttering in chest/dyspnea.  Negative for cough, hemoptysis, and wheezing.   Cardiovascular: Negative for chest pain and leg swelling.  Gastrointestinal: Positive for mild intermittent diarrhea.  Negative for abdominal pain, constipation, nausea and vomiting.  Genitourinary: Negative for bladder incontinence, difficulty urinating, dysuria, frequency and hematuria.   Musculoskeletal: Negative for back pain, gait problem, neck pain and neck stiffness.  Skin: Negative for itching and rash.  Neurological: Negative for dizziness, extremity weakness, gait problem, headaches, light-headedness and seizures.  Hematological: Negative for adenopathy. Does not bruise/bleed easily.  Psychiatric/Behavioral: Negative for confusion, depression and sleep disturbance. The patient is not nervous/anxious.     PHYSICAL EXAMINATION:  Blood pressure (!) 178/70, pulse 84, temperature 98  F (36.7 C), temperature source Oral, resp. rate 16, weight 137 lb 11.2 oz (62.5 kg), SpO2 100 %.  ECOG PERFORMANCE STATUS: 1  Physical Exam  Constitutional: Oriented to person, place, and time and well-developed, well-nourished, and in no distress.  HENT:  Head: Normocephalic and atraumatic.  Mouth/Throat: Oropharynx is clear and moist. No oropharyngeal exudate.  Eyes: Conjunctivae are normal. Right eye exhibits no discharge. Left eye exhibits no discharge. No scleral icterus.   Neck: Normal range of motion. Neck supple.  Cardiovascular: Normal rate, regular rhythm, normal heart sounds and intact distal pulses.   Pulmonary/Chest: Effort normal and breath sounds normal. No respiratory distress. No wheezes. No rales.  Abdominal: Soft. Bowel sounds are normal. Exhibits no distension and no mass. There is no tenderness.  Musculoskeletal: Normal range of motion. Exhibits no edema.  Lymphadenopathy:    No cervical adenopathy.  Neurological: Alert and oriented to person, place, and time. Exhibits normal muscle tone. Gait normal. Coordination normal.  Skin: Skin is warm and dry. No rash noted. Not diaphoretic. No erythema. No pallor.  Psychiatric: Mood, memory and judgment normal.  Vitals reviewed.  LABORATORY DATA: Lab Results  Component Value Date   WBC 6.1 11/09/2021   HGB 9.9 (L) 11/09/2021   HCT 29.8 (L) 11/09/2021   MCV 96.4 11/09/2021   PLT 232 11/09/2021      Chemistry      Component Value Date/Time   NA 133 (L) 11/09/2021 1039   K 4.2 11/09/2021 1039   CL 99 11/09/2021 1039   CO2 29 11/09/2021 1039   BUN 14 11/09/2021 1039   CREATININE 0.84 11/09/2021 1039      Component Value Date/Time   CALCIUM 9.1 11/09/2021 1039   ALKPHOS 60 11/09/2021 1039   AST 19 11/09/2021 1039   ALT 13 11/09/2021 1039   BILITOT 0.4 11/09/2021 1039       RADIOGRAPHIC STUDIES:  NM PET Image Restage (PS) Skull Base to Thigh (F-18 FDG)  Result Date: 11/22/2021 CLINICAL DATA:  Subsequent treatment strategy for non-small-cell lung cancer. EXAM: NUCLEAR MEDICINE PET SKULL BASE TO THIGH TECHNIQUE: 6.6 mCi F-18 FDG was injected intravenously. Full-ring PET imaging was performed from the skull base to thigh after the radiotracer. CT data was obtained and used for attenuation correction and anatomic localization. Fasting blood glucose: 127 mg/dl COMPARISON:  Chest CT 11/09/2021.  PET-CT 05/29/2019. FINDINGS: Mediastinal blood pool activity: SUV max 2.7 Liver activity: SUV max NA  NECK: Hypermetabolism is identified in the lower neck and supraclavicular regions without lymphadenopathy or abnormal soft tissue. The FDG activity maps to areas of fat density and appearance is consistent with hypermetabolic brown fat. Incidental CT findings: none CHEST: Symmetric hypermetabolic foci are seen overlying fatty areas in the axilla and symmetrically in the paraspinal soft tissue, also consistent with hypermetabolic brown fat. The areas of increasing pleural nodularity seen in the paraspinal lower left hemithorax are in close proximity to and essentially obscured by the hypermetabolic brown fat on PET imaging. While no definite hypermetabolic activity can be identified in these pleural nodules of concern, superimposition of activity from the adjacent hypermetabolic brown fat hinders assessment. No hypermetabolic mediastinal or hilar lymphadenopathy. Incidental CT findings: Coronary artery calcification is evident. Mild atherosclerotic calcification is noted in the wall of the thoracic aorta. No suspicious pulmonary nodule or mass. No pleural effusion. ABDOMEN/PELVIS: No abnormal hypermetabolic activity within the liver, pancreas, adrenal glands, or spleen. No hypermetabolic lymph nodes in the abdomen or pelvis. Uptake along the posterior hepatic  dome likely related to hypermetabolic brown fat as there is no underlying lymphadenopathy or soft tissue lesion. Incidental CT findings: There is moderate atherosclerotic calcification of the abdominal aorta without aneurysm. Left colonic diverticulosis without diverticulitis. SKELETON: No focal hypermetabolic activity to suggest skeletal metastasis. Incidental CT findings: none IMPRESSION: 1. Symmetric markedly hypermetabolic brown fat in the lower neck, supraclavicular regions, axillary regions, and paraspinal tissues of the chest. Unfortunately, the high levels of uptake in the hypermetabolic brown fat are superimposed on the nodular areas of pleural  thickening described in the recent CT report of 11/09/2021. This superimposition of activity from the brown fat effectively obscures the pleural lesions on PET imaging. As such, the presence or absence of hypermetabolism in these tiny pleural based nodules cannot be determined on today's exam. 2. No other areas of suspicious or unexpected hypermetabolism in the neck, chest, abdomen, or pelvis to suggest additional sites of unexpected hypermetabolic metastatic involvement. 3. Left colonic diverticulosis without diverticulitis. 4.  Aortic Atherosclerois (ICD10-170.0) Electronically Signed   By: Misty Stanley M.D.   On: 11/22/2021 14:19   CT Chest Wo Contrast  Result Date: 11/09/2021 CLINICAL DATA:  Non-small-cell lung cancer.  Restaging. EXAM: CT CHEST WITHOUT CONTRAST TECHNIQUE: Multidetector CT imaging of the chest was performed following the standard protocol without IV contrast. RADIATION DOSE REDUCTION: This exam was performed according to the departmental dose-optimization program which includes automated exposure control, adjustment of the mA and/or kV according to patient size and/or use of iterative reconstruction technique. COMPARISON:  07/15/2021 FINDINGS: Cardiovascular: The heart size is normal. No substantial pericardial effusion. Coronary artery calcification is evident. Mild atherosclerotic calcification is noted in the wall of the thoracic aorta. Mediastinum/Nodes: Index high right paratracheal lymph node is stable at 8 mm (28/2). No mediastinal lymphadenopathy. No evidence for gross hilar lymphadenopathy although assessment is limited by the lack of intravenous contrast on the current study. The esophagus has normal imaging features. 6 mm low paraesophageal node seen on the previous study is stable today on image 111/2. A second lymph node previously seen in the region of the proximal stomach, below the hiatus is also stable on image 114/2. There is no axillary lymphadenopathy. Lungs/Pleura:  Volume loss left hemithorax again noted consistent with history of left lower lobectomy. No new suspicious pulmonary nodule or mass. No focal airspace consolidation. No pleural effusion. The plaque-like areas of left pleural thickening monitored over multiple previous study show evidence of interval progression. 13 x 7 mm left paraspinal pleural based nodule on image 46/2 has increased in the interval. This finding was not really nodular on the prior study, having a more plaque-like appearance, and measuring 8 x 3 mm. Focal pleural thickening described on the previous study in the posteromedial left hemithorax also appears minimally increased measuring 5 mm thickness today (image 60/2 today) compared to 1-2 mm previously (remeasured). A third area of nodular pleural thickening in the paraspinal left hemithorax (image 70/2 today) measures 8 x 12 mm compared to 4 x 5 mm previously (remeasured). A fourth focus of soft tissue pleural nodularity is seen on image 74/2, measuring 6 x 7 mm today. This represented only a tiny focus of focal plaque-like pleural thickening previously at about 2 mm thickness. No evidence for pneumothorax.  No substantial pleural effusion. Upper Abdomen: Unremarkable.  No adrenal nodule or mass. Musculoskeletal: No worrisome lytic or sclerotic osseous abnormality. IMPRESSION: 1. Interval progression of the plaque-like areas of left pleural thickening more nodular seen on previous imaging, now with more  nodular characteristics. Imaging features are highly concerning for recurrent disease. 2. Stable appearance of the small low paraesophageal and proximal gastric lymph nodes. No new lymphadenopathy on today's study. 3. Aortic Atherosclerosis (ICD10-I70.0). Electronically Signed   By: Kennith Center M.D.   On: 11/09/2021 14:34     ASSESSMENT/PLAN:  This is a very pleasant 71 year old Caucasian female diagnosed initially as a stage IIa non-small cell lung cancer, adenocarcinoma.  She is positive  for an EGFR mutation exon 21 (L858R).  She was diagnosed in November 2019.  She is status post a left lower lobectomy with lymph node dissection under the care of Dr. Dorris Fetch in January 2020.  She declined to proceed with adjuvant systemic chemotherapy or targeted therapy after her surgical resection.  Therefore she continue on observation.  She then was found to have evidence of disease progression with suspicious pleural-based mass and nodularity in the left lung.  This was confirmed by repeat biopsy.  Molecular studies showed she is positive for EGFR mutation.  She then started on treatment with Tagrisso 80 mg p.o. daily status post 29 months of treatment.  She had been tolerating this well except for mild rash and diarrhea.   She had a restaging CT scan in July 2023 which showed suspicious disease progression with plaque-like areas in the left pleura.  She subsequently underwent a PET scan which was performed earlier today.  Dr. Arbutus Ped personally independently reviewed the scan results and discussed results with the patient today.  The patient's scan showed symmetric hypermetabolic brown fat in the lower neck, supraclavicular region, axillary region, and paraspinal tissue of the chest.  Because of the high levels of uptake, this is superimposed on the nodular areas of pleural thickening described on recent CT report.  Therefore, the presence or absence of hypermetabolism in these tiny pleural-based nodules cannot be determined on today's exams.  There is no other areas of suspicious hypermetabolism in the neck, chest, abdomen, or pelvis to suggest additional sites of disease.  Because this PET scan was inconclusive, Dr. Arbutus Ped does not recommend changing treatment or proceeding with SBRT at this time.  Dr. Arbutus Ped recommends that the patient continue on her current treatment with Tagrisso with close monitoring of these plaque-like areas in the left lung.  We will arrange for a close  interval follow-up CT scan of the chest in approximately 6 weeks.  We will see her back for a follow-up visit to review the scan results.   She will continue taking iron supplements for her anemia.  She will continue to take Pepcid for her dyspepsia.  The patient was advised to call immediately if she has any concerning symptoms in the interval. The patient voices understanding of current disease status and treatment options and is in agreement with the current care plan. All questions were answered. The patient knows to call the clinic with any problems, questions or concerns. We can certainly see the patient much sooner if necessary   Orders Placed This Encounter  Procedures   CT Chest Wo Contrast    Standing Status:   Future    Standing Expiration Date:   11/22/2022    Order Specific Question:   Preferred imaging location?    Answer:   [facility]   CBC with Differential (Cancer Center Only)    Standing Status:   Future    Standing Expiration Date:   11/23/2022   CMP (Cancer Center only)    Standing Status:   Future  Standing Expiration Date:   11/23/2022      Tobe Sos Dohn Stclair, PA-C 11/22/21  ADDENDUM: Hematology/Oncology Attending: I had a face-to-face encounter with the patient today.  I reviewed her records, lab, scan and recommended her care plan.  This is a very pleasant 71 years old white female with recurrent non-small cell lung cancer, adenocarcinoma diagnosed initially in November 2019 as a stage IIa status post left lower lobectomy with lymph node dissection on April 25, 2018 under the care of Dr. Roxan Hockey.  The molecular studies showed positive EGFR mutation with deletion in exon 21.  PD-L1 expression was negative.  The patient started treatment with Tagrisso 80 mg p.o. daily on June 22, 2019 status post 29 months of treatment and has been tolerating her treatment fairly well. She was found on recent CT scan of the chest to have suspicious  progression of plaque-like areas of the left pleura concerning for disease recurrence.  I did order a PET scan which was performed earlier today and that showed no clear hypermetabolic activity in this area which was superimposed by activity from the brown fat. I had a lengthy discussion with the patient and her husband today about her condition.  I recommended for her to continue her current treatment with Tagrisso with the same dose. I will see her back for follow-up visit in 6 weeks for evaluation with repeat CT scan of the chest for restaging of her disease and to make sure that there is no evidence for disease progression of these plaque-like areas.  The patient and her husband are in agreement with the current plan. She was advised to call immediately if she has any other concerning symptoms in the interval. The total time spent in the appointment was 30 minutes. Disclaimer: This note was dictated with voice recognition software. Similar sounding words can inadvertently be transcribed and may be missed upon review.  Eilleen Kempf, MD

## 2021-11-16 NOTE — Telephone Encounter (Signed)
Scheduled per 07/25 los, patient has been called and notified.

## 2021-11-22 ENCOUNTER — Other Ambulatory Visit: Payer: Self-pay

## 2021-11-22 ENCOUNTER — Inpatient Hospital Stay: Payer: Medicare Other | Attending: Internal Medicine | Admitting: Physician Assistant

## 2021-11-22 ENCOUNTER — Encounter (HOSPITAL_COMMUNITY)
Admission: RE | Admit: 2021-11-22 | Discharge: 2021-11-22 | Disposition: A | Discharge status: Home / Self Care | Payer: Medicare Other | Source: Ambulatory Visit | Attending: Internal Medicine | Admitting: Internal Medicine

## 2021-11-22 VITALS — BP 178/70 | HR 84 | Temp 98.0°F | Resp 16 | Wt 137.7 lb

## 2021-11-22 VITALS — Wt 134.4 lb

## 2021-11-22 DIAGNOSIS — C3432 Malignant neoplasm of lower lobe, left bronchus or lung: Secondary | ICD-10-CM | POA: Diagnosis present

## 2021-11-22 DIAGNOSIS — C3492 Malignant neoplasm of unspecified part of left bronchus or lung: Secondary | ICD-10-CM | POA: Insufficient documentation

## 2021-11-22 DIAGNOSIS — R197 Diarrhea, unspecified: Secondary | ICD-10-CM | POA: Insufficient documentation

## 2021-11-22 DIAGNOSIS — Z79899 Other long term (current) drug therapy: Secondary | ICD-10-CM | POA: Diagnosis not present

## 2021-11-22 DIAGNOSIS — K219 Gastro-esophageal reflux disease without esophagitis: Secondary | ICD-10-CM | POA: Insufficient documentation

## 2021-11-22 DIAGNOSIS — R0609 Other forms of dyspnea: Secondary | ICD-10-CM | POA: Diagnosis not present

## 2021-11-22 DIAGNOSIS — C3490 Malignant neoplasm of unspecified part of unspecified bronchus or lung: Secondary | ICD-10-CM | POA: Insufficient documentation

## 2021-11-22 DIAGNOSIS — D649 Anemia, unspecified: Secondary | ICD-10-CM | POA: Insufficient documentation

## 2021-11-22 LAB — GLUCOSE, CAPILLARY: Glucose-Capillary: 127 mg/dL — ABNORMAL HIGH (ref 70–99)

## 2021-11-22 MED ORDER — FLUDEOXYGLUCOSE F - 18 (FDG) INJECTION
6.6000 | Freq: Once | INTRAVENOUS | Status: AC
  Administered 2021-11-22: 6.6 via INTRAVENOUS

## 2021-11-23 ENCOUNTER — Other Ambulatory Visit (HOSPITAL_COMMUNITY): Payer: Self-pay

## 2021-11-24 ENCOUNTER — Other Ambulatory Visit (HOSPITAL_COMMUNITY): Payer: Self-pay

## 2021-11-24 ENCOUNTER — Ambulatory Visit: Payer: Medicare Other | Admitting: Internal Medicine

## 2021-12-23 ENCOUNTER — Other Ambulatory Visit (HOSPITAL_COMMUNITY): Payer: Self-pay

## 2021-12-24 ENCOUNTER — Other Ambulatory Visit (HOSPITAL_COMMUNITY): Payer: Self-pay

## 2021-12-27 ENCOUNTER — Other Ambulatory Visit (HOSPITAL_COMMUNITY): Payer: Self-pay

## 2022-01-03 ENCOUNTER — Ambulatory Visit (HOSPITAL_COMMUNITY)
Admission: RE | Admit: 2022-01-03 | Discharge: 2022-01-03 | Disposition: A | Discharge status: Home / Self Care | Payer: Medicare Other | Source: Ambulatory Visit | Attending: Physician Assistant | Admitting: Physician Assistant

## 2022-01-03 ENCOUNTER — Inpatient Hospital Stay (HOSPITAL_BASED_OUTPATIENT_CLINIC_OR_DEPARTMENT_OTHER): Payer: Medicare Other | Admitting: Internal Medicine

## 2022-01-03 ENCOUNTER — Other Ambulatory Visit: Payer: Medicare Other

## 2022-01-03 ENCOUNTER — Inpatient Hospital Stay: Payer: Medicare Other | Attending: Internal Medicine

## 2022-01-03 VITALS — BP 156/60 | HR 80 | Temp 98.2°F | Resp 15 | Wt 140.2 lb

## 2022-01-03 DIAGNOSIS — K219 Gastro-esophageal reflux disease without esophagitis: Secondary | ICD-10-CM | POA: Insufficient documentation

## 2022-01-03 DIAGNOSIS — Z79899 Other long term (current) drug therapy: Secondary | ICD-10-CM | POA: Insufficient documentation

## 2022-01-03 DIAGNOSIS — C3492 Malignant neoplasm of unspecified part of left bronchus or lung: Secondary | ICD-10-CM

## 2022-01-03 DIAGNOSIS — R0609 Other forms of dyspnea: Secondary | ICD-10-CM | POA: Diagnosis not present

## 2022-01-03 DIAGNOSIS — D649 Anemia, unspecified: Secondary | ICD-10-CM | POA: Diagnosis not present

## 2022-01-03 LAB — CBC WITH DIFFERENTIAL (CANCER CENTER ONLY)
Abs Immature Granulocytes: 0.02 10*3/uL (ref 0.00–0.07)
Basophils Absolute: 0 10*3/uL (ref 0.0–0.1)
Basophils Relative: 0 %
Eosinophils Absolute: 0.1 10*3/uL (ref 0.0–0.5)
Eosinophils Relative: 1 %
HCT: 34 % — ABNORMAL LOW (ref 36.0–46.0)
Hemoglobin: 11 g/dL — ABNORMAL LOW (ref 12.0–15.0)
Immature Granulocytes: 0 %
Lymphocytes Relative: 25 %
Lymphs Abs: 2 10*3/uL (ref 0.7–4.0)
MCH: 31.5 pg (ref 26.0–34.0)
MCHC: 32.4 g/dL (ref 30.0–36.0)
MCV: 97.4 fL (ref 80.0–100.0)
Monocytes Absolute: 0.7 10*3/uL (ref 0.1–1.0)
Monocytes Relative: 9 %
Neutro Abs: 5.4 10*3/uL (ref 1.7–7.7)
Neutrophils Relative %: 65 %
Platelet Count: 245 10*3/uL (ref 150–400)
RBC: 3.49 MIL/uL — ABNORMAL LOW (ref 3.87–5.11)
RDW: 12.9 % (ref 11.5–15.5)
WBC Count: 8.3 10*3/uL (ref 4.0–10.5)
nRBC: 0 % (ref 0.0–0.2)

## 2022-01-03 LAB — CMP (CANCER CENTER ONLY)
ALT: 12 U/L (ref 0–44)
AST: 21 U/L (ref 15–41)
Albumin: 4.5 g/dL (ref 3.5–5.0)
Alkaline Phosphatase: 68 U/L (ref 38–126)
Anion gap: 9 (ref 5–15)
BUN: 21 mg/dL (ref 8–23)
CO2: 26 mmol/L (ref 22–32)
Calcium: 9.3 mg/dL (ref 8.9–10.3)
Chloride: 99 mmol/L (ref 98–111)
Creatinine: 0.99 mg/dL (ref 0.44–1.00)
GFR, Estimated: >60 mL/min (ref >60)
Glucose, Bld: 122 mg/dL — ABNORMAL HIGH (ref 70–99)
Potassium: 3.8 mmol/L (ref 3.5–5.1)
Sodium: 134 mmol/L — ABNORMAL LOW (ref 135–145)
Total Bilirubin: 0.6 mg/dL (ref 0.3–1.2)
Total Protein: 6.9 g/dL (ref 6.5–8.1)

## 2022-01-03 NOTE — Progress Notes (Signed)
[place] [phone]) [phone]   [phone]  OFFICE PROGRESS NOTE  Donald Prose, MD [place] [address]  DIAGNOSIS: Recurrent non-small cell lung cancer initially diagnosed as stage IIA (T2b, N0, M0) non-small cell lung cancer, adenocarcinoma diagnosed in November 2019  Molecular studies showed EGFR mutation in exon 21 (L858R) PD-L1 expression 0  PRIOR THERAPY: status post left lower lobectomy with lymph node dissection on April 25, 2018 under the care of Dr. Roxan Hockey.  She declined adjuvant therapy  CURRENT THERAPY: Osimertinib (Tagrisso) 80 mg p.o. daily.  First dose was on June 19, 2019.  Status post 30 months of treatment.  INTERVAL HISTORY: [name] 71 y.o. female returns to the clinic today for follow-up visit accompanied by her husband.  The patient is feeling fine today with no concerning complaints except for mild fatigue.  She denied having any current chest pain but has shortness of breath with exertion with no cough or hemoptysis.  She has no nausea, vomiting, diarrhea or constipation.  She has occasional headache.  She denied having any visual changes.  She has no weight loss or night sweats.  She has been tolerating her treatment with Tagrisso fairly well except for the dry skin.  She is here today for evaluation with repeat CT scan of the chest for restaging of her disease.  MEDICAL HISTORY: Past Medical History:  Diagnosis Date   Anemia    Atherosclerosis    GERD (gastroesophageal reflux disease)    Hemoptysis    Hyperlipidemia    Hypertension    IBS (irritable bowel syndrome)    Lung mass    Migraines    Spondylosis    Vitamin D deficiency     ALLERGIES:  is allergic to avocado, influenza vaccines, lisinopril, metoprolol, nexium [esomeprazole magnesium], penicillins, pork-derived products, proton pump inhibitors, sulfites, wasp venom protein, zantac [ranitidine hcl], cefaclor, chlorhexidine,  chloroxine, ciprofloxacin hcl, clarithromycin, gatifloxacin, other, sulfa antibiotics, sulfamethoxazole-trimethoprim, buckwheat, cat hair extract, dog epithelium allergy skin test, eggs or egg-derived products, fluogen [influenza virus vaccine], pollen extract, short ragweed pollen ext, sulfur, aspartame, chocolate, dilaudid [hydromorphone hcl], fentanyl, ivp dye [iodinated contrast media], ketorolac, milk-related compounds, molds & smuts, oxycodone, prednisolone, shellfish allergy, soap, and tilactase.  MEDICATIONS:  Current Outpatient Medications  Medication Sig Dispense Refill   bismuth subsalicylate (PEPTO BISMOL) 262 MG/15ML suspension Take 30 mLs by mouth every 6 (six) hours as needed.     Carboxymethylcellul-Glycerin (LUBRICATING EYE DROPS OP) Place 1 drop into both eyes daily as needed (dry eyes).     Cholecalciferol (VITAMIN D3) LIQD Take 2,000 Units by mouth daily.     Cyanocobalamin (VITAMIN B-12) 1000 MCG SUBL Place 1 tablet under the tongue once a week.     diltiazem (CARDIZEM CD) 120 MG 24 hr capsule Take 120 mg by mouth daily.     EPINEPHrine 0.3 mg/0.3 mL IJ SOAJ injection Inject 0.3 mg into the muscle as needed for anaphylaxis.     famotidine (PEPCID) 40 MG tablet Take 40 mg by mouth daily.     Multiple Vitamin (MULTIVITAMIN) tablet Take 1 tablet by mouth daily.     Omega 3-6-9 Fatty Acids (OMEGA-3 FUSION) LIQD Take by mouth.     osimertinib mesylate (TAGRISSO) 80 MG tablet TAKE 1 TABLET (80 MG TOTAL) BY MOUTH DAILY. 90 tablet 1   OVER THE COUNTER MEDICATION Triamcinolone, urea and menthol and camphor     OVER THE COUNTER MEDICATION Iron gummies.  PRESCRIPTION MEDICATION Apply 1 application topically 2 (two) times daily as needed for itching. Dr Molinda Bailiff 2019     rizatriptan (MAXALT) 10 MG tablet Take 5 mg by mouth as needed for migraine. May repeat in 2 hours if needed     No current facility-administered medications for this visit.    SURGICAL HISTORY:  Past Surgical  History:  Procedure Laterality Date   BROW LIFT AND BLEPHAROPLASTY  03/15/2013   CATARACT EXTRACTION, BILATERAL  06/23/2014   CHEST TUBE INSERTION Left 05/10/2018   Procedure: CHEST TUBE INSERTION;  Surgeon: [name], MD;  Location: [place];  Service: Thoracic;  Laterality: Left;   COLONOSCOPY  05/2014   ESOPHAGOGASTRODUODENOSCOPY  2019   EYE SURGERY     IR THORACENTESIS ASP PLEURAL SPACE W/IMG GUIDE  05/24/2018   IR THORACENTESIS ASP PLEURAL SPACE W/IMG GUIDE  06/14/2018   TONSILLECTOMY     VIDEO ASSISTED THORACOSCOPY (VATS)/ LOBECTOMY Left 04/25/2018   Procedure: VIDEO ASSISTED THORACOSCOPY (VATS)LEFT LOWER LOBECTOMY, NODE DISSECTION;  Surgeon: [name], MD;  Location: [place];  Service: Thoracic;  Laterality: Left;   VIDEO BRONCHOSCOPY WITH ENDOBRONCHIAL NAVIGATION N/A 03/07/2018   Procedure: VIDEO BRONCHOSCOPY WITH ENDOBRONCHIAL NAVIGATION;  Surgeon: Collene Gobble, MD;  Location[facility];  Service: Thoracic;  Laterality: N/A;   VIDEO BRONCHOSCOPY WITH INSERTION OF INTERBRONCHIAL VALVE (IBV) N/A 05/10/2018   Procedure: VIDEO BRONCHOSCOPY WITH INSERTION OF INTERBRONCHIAL VALVE (IBV);  Surgeon: [name], MD;  Location: [facility] OR;  Service: Thoracic;  Laterality: N/A;   VIDEO BRONCHOSCOPY WITH INSERTION OF INTERBRONCHIAL VALVE (IBV) N/A 06/13/2018   Procedure: VIDEO BRONCHOSCOPY WITH REMOVAL OF INTERBRONCHIAL VALVE (IBV) x 2;  Surgeon: [name], MD;  Location: [place];  Service: Thoracic;  Laterality: N/A;    REVIEW OF SYSTEMS:  Constitutional: positive for fatigue Eyes: negative Ears, nose, mouth, throat, and face: negative Respiratory: positive for dyspnea on exertion Cardiovascular: negative Gastrointestinal: positive for dyspepsia Genitourinary:negative Integument/breast: negative Hematologic/lymphatic: negative Musculoskeletal:negative Neurological: negative Behavioral/Psych: negative Endocrine: negative Allergic/Immunologic: negative    PHYSICAL EXAMINATION: General appearance: alert, cooperative, fatigued, and no distress Head: Normocephalic, without obvious abnormality, atraumatic Neck: no adenopathy, no JVD, supple, symmetrical, trachea midline, and thyroid not enlarged, symmetric, no tenderness/mass/nodules Lymph nodes: Cervical, supraclavicular, and axillary nodes normal. Resp: clear to auscultation bilaterally Back: symmetric, no curvature. ROM normal. No CVA tenderness. Cardio: regular rate and rhythm, S1, S2 normal, no murmur, click, rub or gallop GI: soft, non-tender; bowel sounds normal; no masses,  no organomegaly Extremities: extremities normal, atraumatic, no cyanosis or edema Neurologic: Alert and oriented X 3, normal strength and tone. Normal symmetric reflexes. Normal coordination and gait   ECOG PERFORMANCE STATUS: 1 - Symptomatic but completely ambulatory  Blood pressure (!) 156/60, pulse 80, temperature 98.2 F (36.8 C), temperature source Oral, resp. rate 15, weight 140 lb 3.2 oz (63.6 kg), SpO2 97 %.   LABORATORY DATA: Lab Results  Component Value Date   WBC 8.3 01/03/2022   HGB 11.0 (L) 01/03/2022   HCT 34.0 (L) 01/03/2022   MCV 97.4 01/03/2022   PLT 245 01/03/2022      Chemistry      Component Value Date/Time   NA 134 (L) 01/03/2022 1005   K 3.8 01/03/2022 1005   CL 99 01/03/2022 1005   CO2 26 01/03/2022 1005   BUN 21 01/03/2022 1005   CREATININE 0.99 01/03/2022 1005      Component Value Date/Time   CALCIUM 9.3 01/03/2022 1005   ALKPHOS  68 01/03/2022 1005   AST 21 01/03/2022 1005   ALT 12 01/03/2022 1005   BILITOT 0.6 01/03/2022 1005       RADIOGRAPHIC STUDIES: CT Chest Wo Contrast  Result Date: 01/03/2022 CLINICAL DATA:  Non-small-cell lung cancer.  Restaging. EXAM: CT CHEST WITHOUT CONTRAST TECHNIQUE: Multidetector CT imaging of the chest was performed following the standard protocol without IV contrast. RADIATION DOSE REDUCTION: This exam was performed according to the  departmental dose-optimization program which includes automated exposure control, adjustment of the mA and/or kV according to patient size and/or use of iterative reconstruction technique. COMPARISON:  11/09/2021.  07/15/2021. FINDINGS: Cardiovascular: The heart size is normal. No substantial pericardial effusion. Coronary artery calcification is evident. Mild atherosclerotic calcification is noted in the wall of the thoracic aorta. Mediastinum/Nodes: No mediastinal lymphadenopathy. No evidence for gross hilar lymphadenopathy although assessment is limited by the lack of intravenous contrast on the current study. The esophagus has normal imaging features. Tiny distal paraesophageal nodule is stable at 6 mm. Tiny nodule seen previously in the region of the GE junction is stable at 8 mm. Lungs/Pleura: Volume loss left hemithorax again noted consistent with left lower lobectomy. No new suspicious pulmonary nodule or mass. Chronic subsegmental atelectasis in the posterior lung bases is stable. The plaque-like areas of focal pleural thickening seen in the left hemithorax previously now have a more nodular configuration. Index lesion just inferior to the left 6th rib measures 13 x 10 mm on image 45/2. This lesion was 13 x 7 mm on 11/09/2021 (46/2) and more plaque-like without nodularity on 07/15/2021. 2nd paraspinal nodule in the left hemithorax visible on image 60/2 is now 6 mm in thickness compared to 5 mm on the previous study (60/2) and about 2 mm on 07/15/2021. The third area of index nodular pleural thickening is seen on image 68/2 today. This measures 14 x 9 mm today compared to 12 x 8 mm previously (70/2). The fourth index lesion seen on image 73/2 today is 13 x 7 mm which compares to 7 x 6 mm previously (74/2) and about 5 x 5 mm on the study from 07/15/2021. There is a subtle plaque-like pleural lesion in the posterior left hemithorax on image 70/2, measuring 2 mm thickness and stable in the interval. Dominant  index nodules are well demonstrated today on sagittal image 88 of series 7 and coronal image 116 of series 5. Unremarkable. No pleural effusion. Upper Abdomen: Unremarkable Musculoskeletal: No worrisome lytic or sclerotic osseous abnormality. IMPRESSION: 1. The nodular areas of pleural thickening seen previously in the left hemithorax show continued mild progression since 11/09/2021, with growth more evident when comparing back to an older exam of 07/15/2021. 2. Otherwise no new or progressive interval findings. 3. Status post left lower lobectomy. 4. Stable tiny distal paraesophageal and GE junction/proximal gastric nodules. 5. Aortic atherosclerosis (ICD10-I70.0). Electronically Signed   By: Misty Stanley M.D.   On: 01/03/2022 11:47    ASSESSMENT AND PLAN: This is a very pleasant 71 years old white female with a stage IIa non-small cell lung cancer, adenocarcinoma with positive EGFR mutation in exon 21 (L858R) status post left lower lobectomy with lymph node dissection under the care of Dr. Roxan Hockey. The patient is currently on observation.  She declined to proceed with adjuvant systemic chemotherapy or targeted therapy. The patient has been in observation since her surgical resection. Recent CT scan of the chest followed by PET scan showed suspicious pleural-based mass and nodularity in the left lung concerning for  disease recurrence.  Her disease recurrence was confirmed with repeat biopsy of the pleural-based left lung mass. We had molecular studies on the new biopsy and it was consistent with EGFR mutation exon 21 (L858R). The patient started treatment with Tagrisso 80 mg p.o. daily status post almost 30 months of treatment.  She has been tolerating her treatment with Tagrisso fairly well with no concerning adverse effects except for mild rash and diarrhea. The patient had repeat CT scan of the chest performed recently.  I personally and independently reviewed the scan images and discussed results  with the patient and her husband. The scan showed the nodular areas of pleural thickening continue to have mild progression which is more evident compared to March 2023.  There was no other finding for disease progression in the chest. I had a lengthy discussion with the patient and her husband today about her condition and further investigation to confirm her disease progression and also possible treatment options. I recommended for the patient to consider CT-guided core biopsy of one of the prominent pleural-based nodules for confirmation of tissue diagnosis and also to have enough material for molecular studies to see if the patient develop any resistant actionable mutations.  If the biopsy confirms disease progression, we will consider her for treatment with SBRT to these nodules. I will arrange for her to come back for follow-up visit 1 week after the biopsy. For the anemia, she will continue with the oral iron tablets. For the dyspepsia she will continue her current treatment with Pepcid. The patient was advised to call immediately if she has any other concerning symptoms in the interval.  The patient voices understanding of current disease status and treatment options and is in agreement with the current care plan. All questions were answered. The patient knows to call the clinic with any problems, questions or concerns. We can certainly see the patient much sooner if necessary.  Disclaimer: This note was dictated with voice recognition software. Similar sounding words can inadvertently be transcribed and may not be corrected upon review.

## 2022-01-04 ENCOUNTER — Telehealth: Payer: Self-pay | Admitting: Internal Medicine

## 2022-01-04 NOTE — Telephone Encounter (Signed)
Scheduled follow-up appointment per 9/18 los. Patient is aware.

## 2022-01-05 ENCOUNTER — Encounter: Payer: Self-pay | Admitting: *Deleted

## 2022-01-05 NOTE — Progress Notes (Unsigned)
[name], MD  [name] Ok   CT core post L pleural nodule  See CT 01/03/22 Im 68 Se 2   DD[facility]        Previous Messages    ----- Message -----  From: [name]  Sent: 01/03/2022   1:50 PM EDT  To: [name]  Subject: CT Biopsy                                         Procedure:  CT Biopsy   Reason:  CT-guided core biopsy of the most accessible progressive left pleural-based lung nodules   History:  Ct chest in chart done today 9/18   Provider:  [name]   Contact: [phone]

## 2022-01-19 ENCOUNTER — Other Ambulatory Visit: Payer: Self-pay | Admitting: Internal Medicine

## 2022-01-19 ENCOUNTER — Other Ambulatory Visit (HOSPITAL_COMMUNITY): Payer: Self-pay

## 2022-01-19 ENCOUNTER — Encounter (HOSPITAL_COMMUNITY): Payer: Self-pay | Admitting: *Deleted

## 2022-01-19 ENCOUNTER — Other Ambulatory Visit: Payer: Self-pay | Admitting: Student

## 2022-01-19 DIAGNOSIS — C3492 Malignant neoplasm of unspecified part of left bronchus or lung: Secondary | ICD-10-CM

## 2022-01-19 MED ORDER — OSIMERTINIB MESYLATE 80 MG PO TABS
ORAL_TABLET | Freq: Every day | ORAL | 1 refills | Status: DC
  Filled 2022-01-19: qty 30, 30d supply, fill #0
  Filled 2022-02-15: qty 30, 30d supply, fill #1
  Filled 2022-03-28: qty 30, 30d supply, fill #2
  Filled 2022-04-26: qty 30, 30d supply, fill #3
  Filled 2022-05-20: qty 30, 30d supply, fill #4
  Filled 2022-06-28: qty 30, 30d supply, fill #5

## 2022-01-19 NOTE — Progress Notes (Signed)
This RN received phone call from pt who had questions about her biopsy appt tomorrow at [facility]. I advised pt per appt note in chart to arrive at 7am to [facility] admitting, biopsy time is 9am in CT.  Pt states that she is a VERY HARD IV stick. Pt requests that IV team be called to start her IV tomorrow. I advised that I will inform SS at [facility]. I have notified unit coordinator. I also encouraged pt to tell staff upon her arrival.  Pt also asked if her husband will be allowed to come back to [facility] when she is getting prepped for appt. I advised that I am not sure of the visitor policy at [facility] but encouraged her to ask this upon arrival. Pt appreciative of assistance.

## 2022-01-20 ENCOUNTER — Ambulatory Visit (HOSPITAL_COMMUNITY)
Admission: RE | Admit: 2022-01-20 | Discharge: 2022-01-20 | Disposition: A | Discharge status: Home / Self Care | Payer: Medicare Other | Source: Ambulatory Visit | Attending: Internal Medicine | Admitting: Internal Medicine

## 2022-01-20 ENCOUNTER — Encounter (HOSPITAL_COMMUNITY): Payer: Self-pay

## 2022-01-20 ENCOUNTER — Other Ambulatory Visit: Payer: Self-pay

## 2022-01-20 ENCOUNTER — Ambulatory Visit (HOSPITAL_COMMUNITY)
Admission: RE | Admit: 2022-01-20 | Discharge: 2022-01-20 | Disposition: A | Discharge status: Home / Self Care | Payer: Medicare Other | Source: Ambulatory Visit

## 2022-01-20 DIAGNOSIS — C3492 Malignant neoplasm of unspecified part of left bronchus or lung: Secondary | ICD-10-CM | POA: Insufficient documentation

## 2022-01-20 DIAGNOSIS — I700 Atherosclerosis of aorta: Secondary | ICD-10-CM | POA: Diagnosis not present

## 2022-01-20 LAB — CBC
HCT: 30.9 % — ABNORMAL LOW (ref 36.0–46.0)
Hemoglobin: 10.1 g/dL — ABNORMAL LOW (ref 12.0–15.0)
MCH: 32.2 pg (ref 26.0–34.0)
MCHC: 32.7 g/dL (ref 30.0–36.0)
MCV: 98.4 fL (ref 80.0–100.0)
Platelets: 299 10*3/uL (ref 150–400)
RBC: 3.14 MIL/uL — ABNORMAL LOW (ref 3.87–5.11)
RDW: 12.8 % (ref 11.5–15.5)
WBC: 6 10*3/uL (ref 4.0–10.5)
nRBC: 0 % (ref 0.0–0.2)

## 2022-01-20 LAB — PROTIME-INR
INR: 1 (ref 0.8–1.2)
Prothrombin Time: 13.1 seconds (ref 11.4–15.2)

## 2022-01-20 MED ORDER — NALOXONE HCL 0.4 MG/ML IJ SOLN
INTRAMUSCULAR | Status: AC
  Filled 2022-01-20: qty 1

## 2022-01-20 MED ORDER — SODIUM CHLORIDE 0.9 % IV SOLN: INTRAVENOUS | Status: DC

## 2022-01-20 MED ORDER — MIDAZOLAM HCL 2 MG/2ML IJ SOLN
INTRAMUSCULAR | Status: AC | PRN
  Administered 2022-01-20: 1 mg via INTRAVENOUS

## 2022-01-20 MED ORDER — DIPHENHYDRAMINE HCL 50 MG/ML IJ SOLN
INTRAMUSCULAR | Status: AC
  Filled 2022-01-20: qty 1

## 2022-01-20 MED ORDER — LIDOCAINE HCL 1 % IJ SOLN
INTRAMUSCULAR | Status: AC | PRN
  Administered 2022-01-20: 3 mL via INTRADERMAL

## 2022-01-20 MED ORDER — FLUMAZENIL 0.5 MG/5ML IV SOLN
INTRAVENOUS | Status: AC
  Filled 2022-01-20: qty 5

## 2022-01-20 MED ORDER — FENTANYL CITRATE (PF) 100 MCG/2ML IJ SOLN
INTRAMUSCULAR | Status: AC
  Filled 2022-01-20: qty 2

## 2022-01-20 MED ORDER — MIDAZOLAM HCL 2 MG/2ML IJ SOLN
INTRAMUSCULAR | Status: AC
  Filled 2022-01-20: qty 4

## 2022-01-20 NOTE — H&P (Signed)
Referring Physician(s): Mohamed,Mohamed  Supervising Physician: Jacqulynn Cadet  Patient Status:  [facility] OP  Chief Complaint:  "I'm having a biopsy"  Subjective: Patient familiar to IR service from left thoracentesis x2 in 2020, and left pleural mass biopsy in 2021.  She has a history of recurrent non-small cell lung cancer, adenocarcinoma, initially diagnosed in November 2019 with prior left lower lobectomy and lymph node dissection in 2020.  Latest restaging CT performed on 9/18 revealed: 1. The nodular areas of pleural thickening seen previously in the left hemithorax show continued mild progression since 11/09/2021, with growth more evident when comparing back to an older exam of 07/15/2021. 2. Otherwise no new or progressive interval findings. 3. Status post left lower lobectomy. 4. Stable tiny distal paraesophageal and GE junction/proximal gastric nodules. 5. Aortic atherosclerosis  She presents today for CT-guided posterior left pleural nodule biopsy for pathology and molecular studies.  She currently denies fever, headache, chest pain, dyspnea, cough, abdominal/back pain, nausea, vomiting or bleeding.  She is anxious.  Past Medical History:  Diagnosis Date   Anemia    Atherosclerosis    GERD (gastroesophageal reflux disease)    Hemoptysis    Hyperlipidemia    Hypertension    IBS (irritable bowel syndrome)    Lung mass    Migraines    Spondylosis    Vitamin D deficiency    Past Surgical History:  Procedure Laterality Date   BROW LIFT AND BLEPHAROPLASTY  03/15/2013   CATARACT EXTRACTION, BILATERAL  06/23/2014   CHEST TUBE INSERTION Left 05/10/2018   Procedure: CHEST TUBE INSERTION;  Surgeon: [name], MD;  Location: [place] OR;  Service: Thoracic;  Laterality: Left;   COLONOSCOPY  05/2014   ESOPHAGOGASTRODUODENOSCOPY  2019   EYE SURGERY     IR THORACENTESIS ASP PLEURAL SPACE W/IMG GUIDE  05/24/2018   IR THORACENTESIS ASP PLEURAL SPACE W/IMG GUIDE   06/14/2018   TONSILLECTOMY     VIDEO ASSISTED THORACOSCOPY (VATS)/ LOBECTOMY Left 04/25/2018   Procedure: VIDEO ASSISTED THORACOSCOPY (VATS)LEFT LOWER LOBECTOMY, NODE DISSECTION;  Surgeon: [name], MD;  Location: [place] OR;  Service: Thoracic;  Laterality: Left;   VIDEO BRONCHOSCOPY WITH ENDOBRONCHIAL NAVIGATION N/A 03/07/2018   Procedure: VIDEO BRONCHOSCOPY WITH ENDOBRONCHIAL NAVIGATION;  Surgeon: Collene Gobble, MD;  Location: [facility] OR;  Service: Thoracic;  Laterality: N/A;   VIDEO BRONCHOSCOPY WITH INSERTION OF INTERBRONCHIAL VALVE (IBV) N/A 05/10/2018   Procedure: VIDEO BRONCHOSCOPY WITH INSERTION OF INTERBRONCHIAL VALVE (IBV);  Surgeon: [name], MD;  Location: [facility] OR;  Service: Thoracic;  Laterality: N/A;   VIDEO BRONCHOSCOPY WITH INSERTION OF INTERBRONCHIAL VALVE (IBV) N/A 06/13/2018   Procedure: VIDEO BRONCHOSCOPY WITH REMOVAL OF INTERBRONCHIAL VALVE (IBV) x 2;  Surgeon: [name], MD;  Location: [place];  Service: Thoracic;  Laterality: N/A;     Allergies: Avocado, Influenza vaccines, Lisinopril, Metoprolol, Nexium [esomeprazole magnesium], Penicillins, Pork-derived products, Proton pump inhibitors, Sulfites, Wasp venom protein, Zantac [ranitidine hcl], Cefaclor, Chlorhexidine, Chloroxine, Ciprofloxacin hcl, Clarithromycin, Gatifloxacin, Other, Sulfa antibiotics, Sulfamethoxazole-trimethoprim, Buckwheat, Cat hair extract, Dog epithelium allergy skin test, Eggs or egg-derived products, Fluogen [influenza virus vaccine], Pollen extract, Short ragweed pollen ext, Sulfur, Aspartame, Chocolate, Dilaudid [hydromorphone hcl], Fentanyl, Ivp dye [iodinated contrast media], Ketorolac, Milk-related compounds, Molds & smuts, Oxycodone, Prednisolone, Shellfish allergy, Soap, and Tilactase  Medications: Prior to Admission medications   Medication Sig Start Date End Date Taking? Authorizing Provider  bismuth subsalicylate (PEPTO BISMOL) 262 MG/15ML suspension Take 30 mLs by  mouth every 6 (six)  hours as needed.    [provider]  Carboxymethylcellul-Glycerin (LUBRICATING EYE DROPS OP) Place 1 drop into both eyes daily as needed (dry eyes).    [provider]  Cholecalciferol (VITAMIN D3) LIQD Take 2,000 Units by mouth daily.    [provider]  Cyanocobalamin (VITAMIN B-12) 1000 MCG SUBL Place 1 tablet under the tongue once a week.    [provider]  diltiazem (CARDIZEM CD) 120 MG 24 hr capsule Take 120 mg by mouth daily. 02/05/21   [provider]  EPINEPHrine 0.3 mg/0.3 mL IJ SOAJ injection Inject 0.3 mg into the muscle as needed for anaphylaxis.    [provider]  famotidine (PEPCID) 40 MG tablet Take 40 mg by mouth daily.    [provider]  Multiple Vitamin (MULTIVITAMIN) tablet Take 1 tablet by mouth daily.    [provider]  Omega 3-6-9 Fatty Acids (OMEGA-3 FUSION) LIQD Take by mouth.    [provider]  osimertinib mesylate (TAGRISSO) 80 MG tablet TAKE 1 TABLET (80 MG TOTAL) BY MOUTH DAILY. 01/19/22 01/19/23  [name], MD  OVER THE COUNTER MEDICATION Triamcinolone, urea and menthol and camphor    [provider]  OVER THE COUNTER MEDICATION Iron gummies.    [provider]  PRESCRIPTION MEDICATION Apply 1 application topically 2 (two) times daily as needed for itching. Dr Molinda Bailiff 2019 07/05/19   [provider]  rizatriptan (MAXALT) 10 MG tablet Take 5 mg by mouth as needed for migraine. May repeat in 2 hours if needed    [provider]     Vital Signs: Blood pressure 142/70, temp 97.9, respirations 16, O2 sat 100% room air    Physical Exam awake, alert.  Chest clear to auscultation bilaterally.  Heart with regular rate and rhythm.  Abdomen soft, positive bowel sounds, nontender.  Trace left pretibial/ankle edema.  No significant right lower extremity edema.  Imaging: No results found.  Labs:  CBC: Recent Labs     07/15/21 1031 09/07/21 1445 11/09/21 1039 01/03/22 1005  WBC 6.5 6.0 6.1 8.3  HGB 10.0* 10.0* 9.9* 11.0*  HCT 30.6* 30.3* 29.8* 34.0*  PLT 239 247 232 245    COAGS: No results for input(s): "INR", "APTT" in the last 8760 hours.  BMP: Recent Labs    07/15/21 1031 09/07/21 1445 11/09/21 1039 01/03/22 1005  NA 133* 135 133* 134*  K 4.4 3.9 4.2 3.8  CL 98 99 99 99  CO2 28 31 29 26   GLUCOSE 102* 105* 96 122*  BUN 19 17 14 21   CALCIUM 9.4 9.2 9.1 9.3  CREATININE 0.85 0.91 0.84 0.99  GFRNONAA >60 >60 >60 >60    LIVER FUNCTION TESTS: Recent Labs    07/15/21 1031 09/07/21 1445 11/09/21 1039 01/03/22 1005  BILITOT 0.4 0.3 0.4 0.6  AST 22 21 19 21   ALT 16 14 13 12   ALKPHOS 54 55 60 68  PROT 6.9 6.9 6.4* 6.9  ALBUMIN 4.5 4.3 4.3 4.5    Assessment and Plan: Patient familiar to IR service from left thoracentesis x2 in 2020, and left pleural mass biopsy in 2021.  She has a history of recurrent non-small cell lung cancer, adenocarcinoma, initially diagnosed in November 2019 with prior left lower lobectomy and lymph node dissection in 2020.  Latest restaging CT performed on 9/18 revealed: 1. The nodular areas of pleural thickening seen previously in the left hemithorax show continued mild progression since 11/09/2021, with growth more evident when comparing  back to an older exam of 07/15/2021. 2. Otherwise no new or progressive interval findings. 3. Status post left lower lobectomy. 4. Stable tiny distal paraesophageal and GE junction/proximal gastric nodules. 5. Aortic atherosclerosis  She presents today for CT-guided posterior left pleural nodule biopsy for pathology and molecular studies. Risks and benefits of procedure was discussed with the patient and/or patient's family including, but not limited to bleeding, infection, pneumothorax, damage to adjacent structures or low yield requiring additional tests.  All of the questions were answered and there is agreement to  proceed.  Consent signed and in chart.    Electronically Signed: D. Rowe Robert, PA-C 01/20/2022, 8:10 AM   I spent a total of 25 Minutes at the the patient's bedside AND on the patient's hospital floor or unit, greater than 50% of which was counseling/coordinating care for CT-guided posterior left pleural nodule biopsy

## 2022-01-20 NOTE — Discharge Instructions (Signed)
Please call Interventional Radiology clinic [phone] with any questions or concerns.  You may remove your dressing and shower tomorrow.  Needle Biopsy, Care After These instructions tell you how to care for yourself after your procedure. Your doctor may also give you more specific instructions. Call your doctor if you have any problems or questions. What can I expect after the procedure? After the procedure, it is common to have: Soreness. Bruising. Mild pain. Follow these instructions at home:  Return to your normal activities as told by your doctor. Ask your doctor what activities are safe for you. Take over-the-counter and prescription medicines only as told by your doctor. Wash your hands with soap and water before you change your bandage (dressing). If you cannot use soap and water, use hand sanitizer. Follow instructions from your doctor about: How to take care of your puncture site. When and how to change your bandage. When to remove your bandage. Check your puncture site every day for signs of infection. Watch for: Redness, swelling, or pain. Fluid or blood.  Pus or a bad smell. Warmth. Do not take baths, swim, or use a hot tub until your doctor approves. Ask your doctor if you may take showers. You may only be allowed to take sponge baths. Keep all follow-up visits as told by your doctor. This is important. Contact a doctor if you have: A fever. Redness, swelling, or pain at the puncture site, and it lasts longer than a few days. Fluid, blood, or pus coming from the puncture site. Warmth coming from the puncture site. Get help right away if: You have a lot of bleeding from the puncture site. Summary After the procedure, it is common to have soreness, bruising, or mild pain at the puncture site. Check your puncture site every day for signs of infection, such as redness, swelling, or pain. Get help right away if you have severe bleeding from your puncture site. This  information is not intended to replace advice given to you by your health care provider. Make sure you discuss any questions you have with your health care provider. Document Revised: 04/17/2017 Document Reviewed: 04/17/2017 Elsevier Patient Education  [place].   Moderate Conscious Sedation, Adult, Care After This sheet gives you information about how to care for yourself after your procedure. Your health care provider may also give you more specific instructions. If you have problems or questions, contact your health careprovider. What can I expect after the procedure? After the procedure, it is common to have: Sleepiness for several hours. Impaired judgment for several hours. Difficulty with balance. Vomiting if you eat too soon. Follow these instructions at home: For the time period you were told by your health care provider: Rest. Do not participate in activities where you could fall or become injured. Do not drive or use machinery. Do not drink alcohol. Do not take sleeping pills or medicines that cause drowsiness. Do not make important decisions or sign legal documents. Do not take care of children on your own. Eating and drinking  Follow the diet recommended by your health care provider. Drink enough fluid to keep your urine pale yellow. If you vomit: Drink water, juice, or soup when you can drink without vomiting. Make sure you have little or no nausea before eating solid foods.  General instructions Take over-the-counter and prescription medicines only as told by your health care provider. Have a responsible adult stay with you for the time you are told. It is important to have someone  help care for you until you are awake and alert. Do not smoke. Keep all follow-up visits as told by your health care provider. This is important. Contact a health care provider if: You are still sleepy or having trouble with balance after 24 hours. You feel light-headed. You  keep feeling nauseous or you keep vomiting. You develop a rash. You have a fever. You have redness or swelling around the IV site. Get help right away if: You have trouble breathing. You have new-onset confusion at home. Summary After the procedure, it is common to feel sleepy, have impaired judgment, or feel nauseous if you eat too soon. Rest after you get home. Know the things you should not do after the procedure. Follow the diet recommended by your health care provider and drink enough fluid to keep your urine pale yellow. Get help right away if you have trouble breathing or new-onset confusion at home. This information is not intended to replace advice given to you by your health care provider. Make sure you discuss any questions you have with your healthcare provider. Document Revised: 08/02/2019 Document Reviewed: 02/28/2019 Elsevier Patient Education  2022 [place].   Please call Interventional Radiology clinic [phone] with any questions or concerns.  You may remove your dressing and shower tomorrow.

## 2022-01-20 NOTE — Procedures (Signed)
Interventional Radiology Procedure Note  Procedure: CT guided core biopsy of left pleural based nodule  Complications: None  Estimated Blood Loss: None  Recommendations: - Bedrest x 1 hr - DC home    Signed,  Criselda Peaches, MD

## 2022-01-21 ENCOUNTER — Other Ambulatory Visit (HOSPITAL_COMMUNITY): Payer: Self-pay

## 2022-01-21 LAB — SURGICAL PATHOLOGY

## 2022-01-25 ENCOUNTER — Ambulatory Visit: Payer: Medicare Other | Admitting: Internal Medicine

## 2022-01-27 ENCOUNTER — Inpatient Hospital Stay: Payer: Medicare Other | Attending: Internal Medicine | Admitting: Internal Medicine

## 2022-01-27 ENCOUNTER — Other Ambulatory Visit: Payer: Self-pay

## 2022-01-27 VITALS — BP 157/64 | HR 84 | Temp 98.2°F | Resp 17 | Wt 139.1 lb

## 2022-01-27 DIAGNOSIS — K219 Gastro-esophageal reflux disease without esophagitis: Secondary | ICD-10-CM | POA: Diagnosis not present

## 2022-01-27 DIAGNOSIS — C3490 Malignant neoplasm of unspecified part of unspecified bronchus or lung: Secondary | ICD-10-CM

## 2022-01-27 DIAGNOSIS — R0609 Other forms of dyspnea: Secondary | ICD-10-CM | POA: Insufficient documentation

## 2022-01-27 DIAGNOSIS — C3492 Malignant neoplasm of unspecified part of left bronchus or lung: Secondary | ICD-10-CM | POA: Diagnosis present

## 2022-01-27 DIAGNOSIS — Z5111 Encounter for antineoplastic chemotherapy: Secondary | ICD-10-CM | POA: Diagnosis not present

## 2022-01-27 DIAGNOSIS — Z79899 Other long term (current) drug therapy: Secondary | ICD-10-CM | POA: Diagnosis not present

## 2022-01-27 DIAGNOSIS — D649 Anemia, unspecified: Secondary | ICD-10-CM | POA: Diagnosis not present

## 2022-01-27 NOTE — Progress Notes (Signed)
[place] [phone]   Fax:([phone]) [phone]  OFFICE PROGRESS NOTE  Donald Prose, MD [place] [address]  DIAGNOSIS: Recurrent non-small cell lung cancer initially diagnosed as stage IIA (T2b, N0, M0) non-small cell lung cancer, adenocarcinoma diagnosed in November 2019  Molecular studies showed EGFR mutation in exon 21 (L858R) PD-L1 expression 0  PRIOR THERAPY: status post left lower lobectomy with lymph node dissection on April 25, 2018 under the care of Dr. Roxan Hockey.  She declined adjuvant therapy  CURRENT THERAPY: Osimertinib (Tagrisso) 80 mg p.o. daily.  First dose was on June 19, 2019.  Status post 31 months of treatment.  INTERVAL HISTORY: [name] 71 y.o. female returns to the clinic today for follow-up visit accompanied by her husband.  The patient is feeling fine today with no concerning complaints except for the baseline fatigue.  He denied having any current chest pain but she has intermittent cough with no shortness of breath or hemoptysis.  She has no fever or chills.  She has no nausea, vomiting, diarrhea or constipation.  She has no headache or visual changes.  She recently underwent CT-guided core biopsy of one of the progressive left lung pleural-based lesion and unfortunately the final pathology was consistent with metastatic adenocarcinoma..  The patient is here today for evaluation and discussion of her treatment options.  MEDICAL HISTORY: Past Medical History:  Diagnosis Date   Anemia    Atherosclerosis    GERD (gastroesophageal reflux disease)    Hemoptysis    Hyperlipidemia    Hypertension    IBS (irritable bowel syndrome)    Lung mass    Migraines    Spondylosis    Vitamin D deficiency     ALLERGIES:  is allergic to avocado, influenza vaccines, lisinopril, metoprolol, nexium [esomeprazole magnesium], penicillins, pork-derived products, proton pump inhibitors, sulfites, wasp venom protein,  zantac [ranitidine hcl], cefaclor, chlorhexidine, chloroxine, ciprofloxacin hcl, clarithromycin, gatifloxacin, other, sulfa antibiotics, sulfamethoxazole-trimethoprim, buckwheat, cat hair extract, dog epithelium allergy skin test, eggs or egg-derived products, fluogen [influenza virus vaccine], pollen extract, short ragweed pollen ext, sulfur, aspartame, chocolate, dilaudid [hydromorphone hcl], fentanyl, ivp dye [iodinated contrast media], ketorolac, milk-related compounds, molds & smuts, oxycodone, prednisolone, shellfish allergy, soap, and tilactase.  MEDICATIONS:  Current Outpatient Medications  Medication Sig Dispense Refill   bismuth subsalicylate (PEPTO BISMOL) 262 MG/15ML suspension Take 30 mLs by mouth every 6 (six) hours as needed.     Carboxymethylcellul-Glycerin (LUBRICATING EYE DROPS OP) Place 1 drop into both eyes daily as needed (dry eyes).     Cholecalciferol (VITAMIN D3) LIQD Take 2,000 Units by mouth daily.     Cyanocobalamin (VITAMIN B-12) 1000 MCG SUBL Place 1 tablet under the tongue once a week.     diltiazem (CARDIZEM CD) 120 MG 24 hr capsule Take 120 mg by mouth daily.     EPINEPHrine 0.3 mg/0.3 mL IJ SOAJ injection Inject 0.3 mg into the muscle as needed for anaphylaxis.     famotidine (PEPCID) 40 MG tablet Take 40 mg by mouth daily.     Multiple Vitamin (MULTIVITAMIN) tablet Take 1 tablet by mouth daily.     Omega 3-6-9 Fatty Acids (OMEGA-3 FUSION) LIQD Take by mouth.     osimertinib mesylate (TAGRISSO) 80 MG tablet TAKE 1 TABLET (80 MG TOTAL) BY MOUTH DAILY. 90 tablet 1   OVER THE COUNTER MEDICATION Triamcinolone, urea and menthol and camphor     OVER THE COUNTER MEDICATION Iron gummies.  PRESCRIPTION MEDICATION Apply 1 application topically 2 (two) times daily as needed for itching. Dr Dallas Schimke 2019     rizatriptan (MAXALT) 10 MG tablet Take 5 mg by mouth as needed for migraine. May repeat in 2 hours if needed     No current facility-administered medications for this  visit.    SURGICAL HISTORY:  Past Surgical History:  Procedure Laterality Date   BROW LIFT AND BLEPHAROPLASTY  03/15/2013   CATARACT EXTRACTION, BILATERAL  06/23/2014   CHEST TUBE INSERTION Left 05/10/2018   Procedure: CHEST TUBE INSERTION;  Surgeon: Loreli Slot, MD;  Location: [facility] OR;  Service: Thoracic;  Laterality: Left;   COLONOSCOPY  05/2014   ESOPHAGOGASTRODUODENOSCOPY  2019   EYE SURGERY     IR THORACENTESIS ASP PLEURAL SPACE W/IMG GUIDE  05/24/2018   IR THORACENTESIS ASP PLEURAL SPACE W/IMG GUIDE  06/14/2018   TONSILLECTOMY     VIDEO ASSISTED THORACOSCOPY (VATS)/ LOBECTOMY Left 04/25/2018   Procedure: VIDEO ASSISTED THORACOSCOPY (VATS)LEFT LOWER LOBECTOMY, NODE DISSECTION;  Surgeon: Loreli Slot, MD;  Location: [facility] OR;  Service: Thoracic;  Laterality: Left;   VIDEO BRONCHOSCOPY WITH ENDOBRONCHIAL NAVIGATION N/A 03/07/2018   Procedure: VIDEO BRONCHOSCOPY WITH ENDOBRONCHIAL NAVIGATION;  Surgeon: Leslye Peer, MD;  Location: [facility] OR;  Service: Thoracic;  Laterality: N/A;   VIDEO BRONCHOSCOPY WITH INSERTION OF INTERBRONCHIAL VALVE (IBV) N/A 05/10/2018   Procedure: VIDEO BRONCHOSCOPY WITH INSERTION OF INTERBRONCHIAL VALVE (IBV);  Surgeon: Loreli Slot, MD;  Location: [facility] OR;  Service: Thoracic;  Laterality: N/A;   VIDEO BRONCHOSCOPY WITH INSERTION OF INTERBRONCHIAL VALVE (IBV) N/A 06/13/2018   Procedure: VIDEO BRONCHOSCOPY WITH REMOVAL OF INTERBRONCHIAL VALVE (IBV) x 2;  Surgeon: Loreli Slot, MD;  Location: [facility] OR;  Service: Thoracic;  Laterality: N/A;    REVIEW OF SYSTEMS:  Constitutional: positive for fatigue Eyes: negative Ears, nose, mouth, throat, and face: negative Respiratory: positive for cough Cardiovascular: negative Gastrointestinal: negative Genitourinary:negative Integument/breast: negative Hematologic/lymphatic: negative Musculoskeletal:negative Neurological: negative Behavioral/Psych: negative Endocrine:  negative Allergic/Immunologic: negative   PHYSICAL EXAMINATION: General appearance: alert, cooperative, fatigued, and no distress Head: Normocephalic, without obvious abnormality, atraumatic Neck: no adenopathy, no JVD, supple, symmetrical, trachea midline, and thyroid not enlarged, symmetric, no tenderness/mass/nodules Lymph nodes: Cervical, supraclavicular, and axillary nodes normal. Resp: clear to auscultation bilaterally Back: symmetric, no curvature. ROM normal. No CVA tenderness. Cardio: regular rate and rhythm, S1, S2 normal, no murmur, click, rub or gallop GI: soft, non-tender; bowel sounds normal; no masses,  no organomegaly Extremities: extremities normal, atraumatic, no cyanosis or edema Neurologic: Alert and oriented X 3, normal strength and tone. Normal symmetric reflexes. Normal coordination and gait   ECOG PERFORMANCE STATUS: 1 - Symptomatic but completely ambulatory  Blood pressure (!) 157/64, pulse 84, temperature 98.2 F (36.8 C), temperature source Oral, resp. rate 17, weight 139 lb 1 oz (63.1 kg), SpO2 100 %.   LABORATORY DATA: Lab Results  Component Value Date   WBC 6.0 01/20/2022   HGB 10.1 (L) 01/20/2022   HCT 30.9 (L) 01/20/2022   MCV 98.4 01/20/2022   PLT 299 01/20/2022      Chemistry      Component Value Date/Time   NA 134 (L) 01/03/2022 1005   K 3.8 01/03/2022 1005   CL 99 01/03/2022 1005   CO2 26 01/03/2022 1005   BUN 21 01/03/2022 1005   CREATININE 0.99 01/03/2022 1005      Component Value Date/Time   CALCIUM 9.3 01/03/2022 1005   ALKPHOS 68 01/03/2022 1005  AST 21 01/03/2022 1005   ALT 12 01/03/2022 1005   BILITOT 0.6 01/03/2022 1005       RADIOGRAPHIC STUDIES: CT LUNG MASS BIOPSY  Result Date: 01/20/2022 INDICATION: History of left-sided non-small cell lung cancer with progressive subpleural nodules concerning for metastatic disease. She presents for CT-guided biopsy of the same. EXAM: CT-guided biopsy MEDICATIONS: None.  ANESTHESIA/SEDATION: Patient given 2 mg Versed intravenously for anxiolysis. This does not constitute moderate sedation. COMPLICATIONS: None immediate. PROCEDURE: Informed written consent was obtained from the patient after a thorough discussion of the procedural risks, benefits and alternatives. All questions were addressed. Maximal Sterile Barrier Technique was utilized including caps, mask, sterile gowns, sterile gloves, sterile drape, hand hygiene and skin antiseptic. A timeout was performed prior to the initiation of the procedure. Axial CT imaging was performed. The small subpleural nodules are identified along the posteromedial aspect of the left pleural space. A suitable lesion was selected and the overlying skin marked. The region was sterilely prepped and draped in the standard fashion using Betadine skin prep. Local anesthesia was attained by infiltration with 1% lidocaine. A small dermatotomy was made. Under intermittent CT guidance, a 17 gauge introducer needle was carefully advanced over the rib and to the margin of the subpleural nodule. Multiple 18 gauge core biopsies were then obtained using the bio Pince automated biopsy device. Biopsy specimens were placed in formalin and delivered to pathology for further analysis. Post biopsy CT imaging demonstrates no evidence of pneumothorax or hematoma. IMPRESSION: Successful CT-guided core biopsy of left subpleural nodule. Electronically Signed   By: Jacqulynn Cadet M.D.   On: 01/20/2022 15:00   CT Chest Wo Contrast  Result Date: 01/03/2022 CLINICAL DATA:  Non-small-cell lung cancer.  Restaging. EXAM: CT CHEST WITHOUT CONTRAST TECHNIQUE: Multidetector CT imaging of the chest was performed following the standard protocol without IV contrast. RADIATION DOSE REDUCTION: This exam was performed according to the departmental dose-optimization program which includes automated exposure control, adjustment of the mA and/or kV according to patient size and/or  use of iterative reconstruction technique. COMPARISON:  11/09/2021.  07/15/2021. FINDINGS: Cardiovascular: The heart size is normal. No substantial pericardial effusion. Coronary artery calcification is evident. Mild atherosclerotic calcification is noted in the wall of the thoracic aorta. Mediastinum/Nodes: No mediastinal lymphadenopathy. No evidence for gross hilar lymphadenopathy although assessment is limited by the lack of intravenous contrast on the current study. The esophagus has normal imaging features. Tiny distal paraesophageal nodule is stable at 6 mm. Tiny nodule seen previously in the region of the GE junction is stable at 8 mm. Lungs/Pleura: Volume loss left hemithorax again noted consistent with left lower lobectomy. No new suspicious pulmonary nodule or mass. Chronic subsegmental atelectasis in the posterior lung bases is stable. The plaque-like areas of focal pleural thickening seen in the left hemithorax previously now have a more nodular configuration. Index lesion just inferior to the left 6th rib measures 13 x 10 mm on image 45/2. This lesion was 13 x 7 mm on 11/09/2021 (46/2) and more plaque-like without nodularity on 07/15/2021. 2nd paraspinal nodule in the left hemithorax visible on image 60/2 is now 6 mm in thickness compared to 5 mm on the previous study (60/2) and about 2 mm on 07/15/2021. The third area of index nodular pleural thickening is seen on image 68/2 today. This measures 14 x 9 mm today compared to 12 x 8 mm previously (70/2). The fourth index lesion seen on image 73/2 today is 13 x 7 mm which compares  to 7 x 6 mm previously (74/2) and about 5 x 5 mm on the study from 07/15/2021. There is a subtle plaque-like pleural lesion in the posterior left hemithorax on image 70/2, measuring 2 mm thickness and stable in the interval. Dominant index nodules are well demonstrated today on sagittal image 88 of series 7 and coronal image 116 of series 5. Unremarkable. No pleural effusion.  Upper Abdomen: Unremarkable Musculoskeletal: No worrisome lytic or sclerotic osseous abnormality. IMPRESSION: 1. The nodular areas of pleural thickening seen previously in the left hemithorax show continued mild progression since 11/09/2021, with growth more evident when comparing back to an older exam of 07/15/2021. 2. Otherwise no new or progressive interval findings. 3. Status post left lower lobectomy. 4. Stable tiny distal paraesophageal and GE junction/proximal gastric nodules. 5. Aortic atherosclerosis (ICD10-I70.0). Electronically Signed   By: Misty Stanley M.D.   On: 01/03/2022 11:47    ASSESSMENT AND PLAN: This is a very pleasant 71 years old white female with a stage IIa non-small cell lung cancer, adenocarcinoma with positive EGFR mutation in exon 21 (L858R) status post left lower lobectomy with lymph node dissection under the care of Dr. Roxan Hockey. The patient is currently on observation.  She declined to proceed with adjuvant systemic chemotherapy or targeted therapy. The patient has been in observation since her surgical resection. Recent CT scan of the chest followed by PET scan showed suspicious pleural-based mass and nodularity in the left lung concerning for disease recurrence.  Her disease recurrence was confirmed with repeat biopsy of the pleural-based left lung mass. We had molecular studies on the new biopsy and it was consistent with EGFR mutation exon 21 (L858R). The patient started treatment with Tagrisso 80 mg p.o. daily status post almost 31 months of treatment.  She has been tolerating her treatment with Tagrisso fairly well with no concerning adverse effects except for mild rash and diarrhea. The scan showed the nodular areas of pleural thickening continue to have mild progression which is more evident compared to March 2023.  There was no other finding for disease progression in the chest. The patient underwent CT-guided core biopsy of one of the pleural-based left lung  progressive lesion and the final pathology was consistent with metastatic adenocarcinoma. I will request the tissue biopsy to be [facility] for molecular studies to rule out any development of other actionable mutations that could be used for treatment. For now she will continue her current treatment with Tagrisso the same dose and I will refer the patient to radiation oncology for evaluation and consideration of SBRT to the progressive lesion in the left lung. I will see her back for follow-up visit in 4 months for evaluation with repeat CT scan of the chest for restaging of her disease after the radiotherapy. For the anemia, she will continue with the oral iron tablets. For the dyspepsia she will continue her current treatment with Pepcid. The patient was advised to call immediately if she has any other concerning symptoms in the interval.  The patient voices understanding of current disease status and treatment options and is in agreement with the current care plan. All questions were answered. The patient knows to call the clinic with any problems, questions or concerns. We can certainly see the patient much sooner if necessary.  Disclaimer: This note was dictated with voice recognition software. Similar sounding words can inadvertently be transcribed and may not be corrected upon review.

## 2022-01-28 ENCOUNTER — Encounter: Payer: Self-pay | Admitting: *Deleted

## 2022-01-28 NOTE — Progress Notes (Signed)
Spoke with Varney Biles in Pathology to request [facility] & PD-L1 testing on patients recent biopsy per Dr. [name].

## 2022-01-31 NOTE — Progress Notes (Signed)
Thoracic Location of Tumor / Histology: progressive lesion in the left lung.   Patient presented  months ago with symptoms of: abnormal CT scan resutls.  Biopsies revealed:   01/20/2022 FINAL MICROSCOPIC DIAGNOSIS:   A. SUBPLEURAL NODULE, NEEDLE CORE BIOPSY:  -Adenocarcinoma.   Tobacco/Marijuana/Snuff/ETOH use: none  Past/Anticipated interventions by cardiothoracic surgery, if any: status post left lower lobectomy with lymph node dissection on April 25, 2018 under the care of Dr. Roxan Hockey.     Past/Anticipated interventions by medical oncology, if any: Osimertinib (Tagrisso) 80 mg p.o. daily.   Signs/Symptoms Weight changes, if any: no Respiratory complaints, if any: does have shortness of breath with too much exertion. Hemoptysis, if any: no Pain issues, if any:  no  SAFETY ISSUES: Prior radiation? no Pacemaker/ICD? no  Possible current pregnancy?no Is the patient on methotrexate? no  Current Complaints / other details:  Patient would like afternoon appointments if possible.  She is also wondering if she can start on 02/14/22.

## 2022-02-02 NOTE — Progress Notes (Signed)
Radiation Oncology         [phone] ________________________________  Initial Outpatient Consultation  Name: [name] MRN: [id number]  Date: 02/03/2022  DOB: 1950-11-11  XL:KGMWN, [name], MD  [name], MD   REFERRING PHYSICIAN: [name], MD  DIAGNOSIS: There were no encounter diagnoses.  Recurrent non-small cell lung cancer initially diagnosed as stage IIA (T2b, N0, M0) non-small cell lung cancer, adenocarcinoma diagnosed in November 2019; positive EGFR mutation in exon 21 (L858R) status post left lower lobectomy with lymph node dissection under the care of Dr. Roxan Hockey. She had a recurrence in 2021 and started tagrisso. Recently diagnosed with recurrence in the left lung with biopsy showing metastatic adenocarcinoma (October 2023)    HISTORY OF PRESENT ILLNESS::[name] is a 71 y.o. female who is accompanied by ***. she is seen as a courtesy of Dr. [name] for an opinion concerning radiation therapy as part of management for her recent recurrence of metastatic adenocarcinoma of the left lung. The patient was diagnosed with adenocarcinoma of the left lung in 2019, status post left lower lobectomy in 2020 with lymph node dissection under the care of Dr. Roxan Hockey. She declined systemic or targeted therapy after resectioning and proceeded with observation. The patient however had a recurrence in 2021 in the left lung. Biopsy of the pleural-based mass/nodularity in the left lung showed adenocarcinoma. Molecular studies on the new biopsy showed findings consistent with an EGFR mutation exon 21 (L858R). She accordingly started treatment with tagrisso (s/p 31 months of treatment) under Dr. [name]. The patient continues to tolerate tagrisso fairly well except for a mild rash and diarrhea.  The patient recently presented for a repeat chest CT on 11/09/21 which showed interval progression of plaque-like areas of left pleural thickening into a more nodular appearance  when compared to previous imaging. This finding raised concern for recurrent disease. CT otherwise showed stable appearance of small lower paraesophageal and proximal gastric lymph nodes. No new lymphadenopathy was appreciated.    Dr. [name] reviewed CT findings with the patient on 07/25 and recommended proceeding with a PET scan for further evaluation. Dr. [name] also discussed the option of SBRT to the lesion if PET findings show no evidence of distant metastatic disease.   Restaging PET scan on 11/22/21 showed symmetric markedly hypermetabolic brown fat in the lower neck, supraclavicular regions, axillary regions, and paraspinal tissues of the chest. The high levels of uptake in the hypermetabolic brown fat appeared to be superimposed on the nodular areas of pleural thickening described on the recent CT. The superimposition of activity from the brown fat also obscured the pleural lesions on PET imaging. No other areas of suspicious or unexpected hypermetabolism in the neck, chest, abdomen, or pelvis were appreciated to suggest additional sites of unexpected hypermetabolic metastatic involvement.  Chest CT without contrast on 01/03/22 showed mild progression of the nodular areas of pleural thickening seen in the left hemithorax (when compared to CT on 11/09/21). Otherwise, CT showed no new or progressive interval findings. (The tiny distal paraesophageal and GE junction / proximal gastric nodules were also appreciated as stable in the interval).   Given PET and recent CT findings, Dr. [name] advised  the patient to proceed with a CT-guided core biopsy of one of the prominent pleural-based nodules for confirmation of tissue diagnosis, and also to have enough material for molecular studies to determine if the patient may develop any resistant actionable mutations.  Biopsies of one of the subpleural nodules in the left lung  on 01/20/22 showed findings consistent with well differentiated metastatic  adenocarcinoma.    Based on PET findings showing no evidence of metastatic disease, Dr. [name] referred her to me for consideration of SBRT to the progressive lesion in the left lung. In the mean time, the patient will continue on with Tagrisso at the same dose.     PREVIOUS RADIATION THERAPY: No  PAST MEDICAL HISTORY:  Past Medical History:  Diagnosis Date   Anemia    Atherosclerosis    GERD (gastroesophageal reflux disease)    Hemoptysis    Hyperlipidemia    Hypertension    IBS (irritable bowel syndrome)    Lung mass    Migraines    Spondylosis    Vitamin D deficiency     PAST SURGICAL HISTORY: Past Surgical History:  Procedure Laterality Date   BROW LIFT AND BLEPHAROPLASTY  03/15/2013   CATARACT EXTRACTION, BILATERAL  06/23/2014   CHEST TUBE INSERTION Left 05/10/2018   Procedure: CHEST TUBE INSERTION;  Surgeon: [name], MD;  Location: [facility];  Service: Thoracic;  Laterality: Left;   COLONOSCOPY  05/2014   ESOPHAGOGASTRODUODENOSCOPY  2019   EYE SURGERY     IR THORACENTESIS ASP PLEURAL SPACE W/IMG GUIDE  05/24/2018   IR THORACENTESIS ASP PLEURAL SPACE W/IMG GUIDE  06/14/2018   TONSILLECTOMY     VIDEO ASSISTED THORACOSCOPY (VATS)/ LOBECTOMY Left 04/25/2018   Procedure: VIDEO ASSISTED THORACOSCOPY (VATS)LEFT LOWER LOBECTOMY, NODE DISSECTION;  Surgeon: [name], MD;  Location: [facility];  Service: Thoracic;  Laterality: Left;   VIDEO BRONCHOSCOPY WITH ENDOBRONCHIAL NAVIGATION N/A 03/07/2018   Procedure: VIDEO BRONCHOSCOPY WITH ENDOBRONCHIAL NAVIGATION;  Surgeon: Collene Gobble, MD;  Location: [facility];  Service: Thoracic;  Laterality: N/A;   VIDEO BRONCHOSCOPY WITH INSERTION OF INTERBRONCHIAL VALVE (IBV) N/A 05/10/2018   Procedure: VIDEO BRONCHOSCOPY WITH INSERTION OF INTERBRONCHIAL VALVE (IBV);  Surgeon: [name], MD;  Location: [facility];  Service: Thoracic;  Laterality: N/A;   VIDEO BRONCHOSCOPY WITH INSERTION OF INTERBRONCHIAL VALVE (IBV) N/A  06/13/2018   Procedure: VIDEO BRONCHOSCOPY WITH REMOVAL OF INTERBRONCHIAL VALVE (IBV) x 2;  Surgeon: [name], MD;  Location: [facility] OR;  Service: Thoracic;  Laterality: N/A;    FAMILY HISTORY:  Family History  Problem Relation Age of Onset   Dementia Mother    Hyperlipidemia Mother    Hypertension Mother    Thyroid disease Mother    Irritable bowel syndrome Mother    Aortic stenosis Mother    Cancer Father    Lung cancer Sister     SOCIAL HISTORY:  Social History   Tobacco Use   Smoking status: Never   Smokeless tobacco: Never  Vaping Use   Vaping Use: Never used  Substance Use Topics   Alcohol use: Yes    Alcohol/week: 4.0 standard drinks of alcohol    Types: 4 Glasses of wine per week    Comment: a week   Drug use: Never    ALLERGIES:  Allergies  Allergen Reactions   Avocado Other (See Comments)    Chest tightness, nauseous   Influenza Vaccines Shortness Of Breath    Couldn't breathe, Nasal swelling.   Lisinopril Anaphylaxis   Metoprolol Shortness Of Breath    Chest tightness   Nexium [Esomeprazole Magnesium] Other (See Comments)    Throat spasms   Penicillins Hives and Other (See Comments)    DID THE REACTION INVOLVE: Swelling of the face/tongue/throat, SOB, or low BP? Unknown Sudden or severe rash/hives, skin  peeling, or the inside of the mouth or nose? Unknown Did it require medical treatment? #  #  #  YES  #  #  #  When did it last happen? Teenager If all above answers are "NO", may proceed with cephalosporin use.    Pork-Derived Products Other (See Comments)    religious   Proton Pump Inhibitors Anaphylaxis   Sulfites Anaphylaxis, Swelling and Other (See Comments)    Turn red and throat swells   Wasp Venom Protein Swelling   Zantac [Ranitidine Hcl] Other (See Comments)    Severe throat spasms   Cefaclor Hives   Chlorhexidine Other (See Comments)    Dizziness   Chloroxine Other (See Comments)    clorox  Severe migraine, chemical cleaning  products   Ciprofloxacin Hcl Hives   Clarithromycin Hives   Gatifloxacin Hives   Other Other (See Comments)    Grass and Trees, Maple, [place], [place], [place], [place], [place], Rag weed, [place], Pollen, Dog and cats, Technical sales engineer and Down. ALL ANTIBIOTICS CAUSE HIVES EXCEPT Z-PAKS. Cayenne Pepper. EKG lead gel causes rashes   Sulfa Antibiotics Hives   Sulfamethoxazole-Trimethoprim Hives   Buckwheat     "Sorrel"   Cat Hair Extract    Dog Epithelium Allergy Skin Test    Eggs Or Egg-Derived Products Other (See Comments)    Tickling/swelling in throat.  Eggland's Best is ok per pt.   Fluogen [Influenza Virus Vaccine]    Pollen Extract    Short Ragweed Pollen Ext    Sulfur    Aspartame Other (See Comments)    Severe diarrhea   Chocolate Other (See Comments)    Acne   Dilaudid [Hydromorphone Hcl] Other (See Comments)    Hallucinations   Fentanyl Other (See Comments)    Red flush   Ivp Dye [Iodinated Contrast Media] Other (See Comments)    Acne   Ketorolac Other (See Comments)    Extreme dizziness and fatigue   Milk-Related Compounds Diarrhea    Cow's products. Can have goat cheese or milk.   Molds & Smuts Other (See Comments)    Unknown   Oxycodone Hives   Prednisolone Other (See Comments)    Extreme dizziness and fatigue   Shellfish Allergy Other (See Comments)    Acne   Soap Other (See Comments)    Soft soap- severe dermatitis   Soft Soap  Brand name   Tilactase Diarrhea    MEDICATIONS:  Current Outpatient Medications  Medication Sig Dispense Refill   bismuth subsalicylate (PEPTO BISMOL) 262 MG/15ML suspension Take 30 mLs by mouth every 6 (six) hours as needed.     Carboxymethylcellul-Glycerin (LUBRICATING EYE DROPS OP) Place 1 drop into both eyes daily as needed (dry eyes).     Cholecalciferol (VITAMIN D3) LIQD Take 2,000 Units by mouth daily.     Cyanocobalamin (VITAMIN B-12) 1000 MCG SUBL Place 1 tablet under the tongue once a week.     diltiazem (CARDIZEM CD) 120 MG 24 hr  capsule Take 120 mg by mouth daily.     EPINEPHrine 0.3 mg/0.3 mL IJ SOAJ injection Inject 0.3 mg into the muscle as needed for anaphylaxis.     famotidine (PEPCID) 40 MG tablet Take 40 mg by mouth daily.     Multiple Vitamin (MULTIVITAMIN) tablet Take 1 tablet by mouth daily.     Omega 3-6-9 Fatty Acids (OMEGA-3 FUSION) LIQD Take by mouth.     osimertinib mesylate (TAGRISSO) 80 MG tablet TAKE 1 TABLET (80 MG TOTAL) BY  MOUTH DAILY. 90 tablet 1   OVER THE COUNTER MEDICATION Triamcinolone, urea and menthol and camphor     OVER THE COUNTER MEDICATION Iron gummies.     PRESCRIPTION MEDICATION Apply 1 application topically 2 (two) times daily as needed for itching. Dr Molinda Bailiff 2019     rizatriptan (MAXALT) 10 MG tablet Take 5 mg by mouth as needed for migraine. May repeat in 2 hours if needed     No current facility-administered medications for this encounter.    REVIEW OF SYSTEMS:  A 10+ POINT REVIEW OF SYSTEMS WAS OBTAINED including neurology, dermatology, psychiatry, cardiac, respiratory, lymph, extremities, GI, GU, musculoskeletal, constitutional, reproductive, HEENT. ***   PHYSICAL EXAM:  vitals were not taken for this visit.   General: Alert and oriented, in no acute distress HEENT: Head is normocephalic. Extraocular movements are intact. Oropharynx is clear. Neck: Neck is supple, no palpable cervical or supraclavicular lymphadenopathy. Heart: Regular in rate and rhythm with no murmurs, rubs, or gallops. Chest: Clear to auscultation bilaterally, with no rhonchi, wheezes, or rales. Abdomen: Soft, nontender, nondistended, with no rigidity or guarding. Extremities: No cyanosis or edema. Lymphatics: see Neck Exam Skin: No concerning lesions. Musculoskeletal: symmetric strength and muscle tone throughout. Neurologic: Cranial nerves II through XII are grossly intact. No obvious focalities. Speech is fluent. Coordination is intact. Psychiatric: Judgment and insight are intact. Affect is  appropriate. ***  ECOG = ***  0 - Asymptomatic (Fully active, able to carry on all predisease activities without restriction)  1 - Symptomatic but completely ambulatory (Restricted in physically strenuous activity but ambulatory and able to carry out work of a light or sedentary nature. For example, light housework, office work)  2 - Symptomatic, <50% in bed during the day (Ambulatory and capable of all self care but unable to carry out any work activities. Up and about more than 50% of waking hours)  3 - Symptomatic, >50% in bed, but not bedbound (Capable of only limited self-care, confined to bed or chair 50% or more of waking hours)  4 - Bedbound (Completely disabled. Cannot carry on any self-care. Totally confined to bed or chair)  5 - Death   Eustace Pen MM, Creech RH, Tormey DC, et al. [phone]). "Toxicity and response criteria of the [facility]". [place] Oncol. 5 (6): 649-55  LABORATORY DATA:  Lab Results  Component Value Date   WBC 6.0 01/20/2022   HGB 10.1 (L) 01/20/2022   HCT 30.9 (L) 01/20/2022   MCV 98.4 01/20/2022   PLT 299 01/20/2022   NEUTROABS 5.4 01/03/2022   Lab Results  Component Value Date   NA 134 (L) 01/03/2022   K 3.8 01/03/2022   CL 99 01/03/2022   CO2 26 01/03/2022   GLUCOSE 122 (H) 01/03/2022   BUN 21 01/03/2022   CREATININE 0.99 01/03/2022   CALCIUM 9.3 01/03/2022      RADIOGRAPHY: CT LUNG MASS BIOPSY  Result Date: 01/20/2022 INDICATION: History of left-sided non-small cell lung cancer with progressive subpleural nodules concerning for metastatic disease. She presents for CT-guided biopsy of the same. EXAM: CT-guided biopsy MEDICATIONS: None. ANESTHESIA/SEDATION: Patient given 2 mg Versed intravenously for anxiolysis. This does not constitute moderate sedation. COMPLICATIONS: None immediate. PROCEDURE: Informed written consent was obtained from the patient after a thorough discussion of the procedural risks, benefits and  alternatives. All questions were addressed. Maximal Sterile Barrier Technique was utilized including caps, mask, sterile gowns, sterile gloves, sterile drape, hand hygiene and skin antiseptic. A timeout was performed  prior to the initiation of the procedure. Axial CT imaging was performed. The small subpleural nodules are identified along the posteromedial aspect of the left pleural space. A suitable lesion was selected and the overlying skin marked. The region was sterilely prepped and draped in the standard fashion using Betadine skin prep. Local anesthesia was attained by infiltration with 1% lidocaine. A small dermatotomy was made. Under intermittent CT guidance, a 17 gauge introducer needle was carefully advanced over the rib and to the margin of the subpleural nodule. Multiple 18 gauge core biopsies were then obtained using the bio Pince automated biopsy device. Biopsy specimens were placed in formalin and delivered to pathology for further analysis. Post biopsy CT imaging demonstrates no evidence of pneumothorax or hematoma. IMPRESSION: Successful CT-guided core biopsy of left subpleural nodule. Electronically Signed   By: Jacqulynn Cadet M.D.   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  We discussed the natural history of *** and general treatment, highlighting the role of radiotherapy in the management.  We discussed the available radiation techniques, and focused on the details of logistics and delivery.  We reviewed the anticipated acute and late sequelae associated with radiation in this setting.  The patient was encouraged to ask questions that I answered to the best of my ability. *** A patient consent form was discussed and signed.  We retained a copy for our records.  The patient would like to proceed with radiation and will be scheduled for CT  simulation.  PLAN: ***    *** minutes of total time was spent for this patient encounter, including preparation, face-to-face counseling with the patient and coordination of care, physical exam, and documentation of the encounter.   ------------------------------------------------  Blair Promise, PhD, MD  This document serves as a record of services personally performed by Gery Pray, MD. It was created on his behalf by Roney Mans, a trained medical scribe. The creation of this record is based on the scribe's personal observations and the provider's statements to them. This document has been checked and approved by the attending provider.

## 2022-02-03 ENCOUNTER — Other Ambulatory Visit: Payer: Self-pay

## 2022-02-03 ENCOUNTER — Ambulatory Visit
Admission: RE | Admit: 2022-02-03 | Discharge: 2022-02-03 | Disposition: A | Discharge status: Home / Self Care | Payer: Medicare Other | Source: Ambulatory Visit | Attending: Radiation Oncology | Admitting: Radiation Oncology

## 2022-02-03 ENCOUNTER — Encounter: Payer: Self-pay | Admitting: Radiation Oncology

## 2022-02-03 VITALS — BP 168/68 | HR 71 | Temp 97.5°F | Resp 18 | Ht 64.75 in | Wt 138.0 lb

## 2022-02-03 DIAGNOSIS — I10 Essential (primary) hypertension: Secondary | ICD-10-CM | POA: Diagnosis not present

## 2022-02-03 DIAGNOSIS — F0390 Unspecified dementia without behavioral disturbance: Secondary | ICD-10-CM | POA: Insufficient documentation

## 2022-02-03 DIAGNOSIS — Z801 Family history of malignant neoplasm of trachea, bronchus and lung: Secondary | ICD-10-CM | POA: Diagnosis not present

## 2022-02-03 DIAGNOSIS — C3492 Malignant neoplasm of unspecified part of left bronchus or lung: Secondary | ICD-10-CM | POA: Insufficient documentation

## 2022-02-03 DIAGNOSIS — Z510 Encounter for antineoplastic radiation therapy: Secondary | ICD-10-CM | POA: Insufficient documentation

## 2022-02-03 DIAGNOSIS — Z79899 Other long term (current) drug therapy: Secondary | ICD-10-CM | POA: Insufficient documentation

## 2022-02-03 DIAGNOSIS — E785 Hyperlipidemia, unspecified: Secondary | ICD-10-CM | POA: Insufficient documentation

## 2022-02-03 DIAGNOSIS — E559 Vitamin D deficiency, unspecified: Secondary | ICD-10-CM | POA: Insufficient documentation

## 2022-02-03 DIAGNOSIS — K580 Irritable bowel syndrome with diarrhea: Secondary | ICD-10-CM | POA: Diagnosis not present

## 2022-02-03 DIAGNOSIS — K219 Gastro-esophageal reflux disease without esophagitis: Secondary | ICD-10-CM | POA: Diagnosis not present

## 2022-02-03 DIAGNOSIS — R21 Rash and other nonspecific skin eruption: Secondary | ICD-10-CM | POA: Diagnosis not present

## 2022-02-07 ENCOUNTER — Encounter (HOSPITAL_COMMUNITY): Payer: Self-pay | Admitting: Internal Medicine

## 2022-02-14 DIAGNOSIS — Z510 Encounter for antineoplastic radiation therapy: Secondary | ICD-10-CM | POA: Diagnosis not present

## 2022-02-15 ENCOUNTER — Other Ambulatory Visit: Payer: Self-pay

## 2022-02-15 ENCOUNTER — Ambulatory Visit
Admission: RE | Admit: 2022-02-15 | Discharge: 2022-02-15 | Disposition: A | Discharge status: Home / Self Care | Payer: Medicare Other | Source: Ambulatory Visit | Attending: Radiation Oncology | Admitting: Radiation Oncology

## 2022-02-15 ENCOUNTER — Other Ambulatory Visit (HOSPITAL_COMMUNITY): Payer: Self-pay

## 2022-02-15 DIAGNOSIS — Z510 Encounter for antineoplastic radiation therapy: Secondary | ICD-10-CM | POA: Diagnosis not present

## 2022-02-15 DIAGNOSIS — C3492 Malignant neoplasm of unspecified part of left bronchus or lung: Secondary | ICD-10-CM

## 2022-02-15 LAB — RAD ONC ARIA SESSION SUMMARY
Course Elapsed Days: 0
Plan Fractions Treated to Date: 1
Plan Prescribed Dose Per Fraction: 5 Gy
Plan Total Fractions Prescribed: 10
Plan Total Prescribed Dose: 50 Gy
Reference Point Dosage Given to Date: 5 Gy
Reference Point Session Dosage Given: 5 Gy
Session Number: 1

## 2022-02-16 ENCOUNTER — Ambulatory Visit
Admission: RE | Admit: 2022-02-16 | Discharge: 2022-02-16 | Disposition: A | Discharge status: Home / Self Care | Payer: Medicare Other | Source: Ambulatory Visit | Attending: Radiation Oncology | Admitting: Radiation Oncology

## 2022-02-16 ENCOUNTER — Other Ambulatory Visit: Payer: Self-pay

## 2022-02-16 DIAGNOSIS — C3492 Malignant neoplasm of unspecified part of left bronchus or lung: Secondary | ICD-10-CM | POA: Diagnosis present

## 2022-02-16 DIAGNOSIS — Z510 Encounter for antineoplastic radiation therapy: Secondary | ICD-10-CM | POA: Insufficient documentation

## 2022-02-16 LAB — RAD ONC ARIA SESSION SUMMARY
Course Elapsed Days: 1
Plan Fractions Treated to Date: 2
Plan Prescribed Dose Per Fraction: 5 Gy
Plan Total Fractions Prescribed: 10
Plan Total Prescribed Dose: 50 Gy
Reference Point Dosage Given to Date: 10 Gy
Reference Point Session Dosage Given: 5 Gy
Session Number: 2

## 2022-02-17 ENCOUNTER — Other Ambulatory Visit: Payer: Self-pay

## 2022-02-17 ENCOUNTER — Other Ambulatory Visit (HOSPITAL_COMMUNITY): Payer: Self-pay

## 2022-02-17 ENCOUNTER — Ambulatory Visit
Admission: RE | Admit: 2022-02-17 | Discharge: 2022-02-17 | Disposition: A | Discharge status: Home / Self Care | Payer: Medicare Other | Source: Ambulatory Visit | Attending: Radiation Oncology | Admitting: Radiation Oncology

## 2022-02-17 DIAGNOSIS — Z510 Encounter for antineoplastic radiation therapy: Secondary | ICD-10-CM | POA: Diagnosis not present

## 2022-02-17 LAB — RAD ONC ARIA SESSION SUMMARY
Course Elapsed Days: 2
Plan Fractions Treated to Date: 3
Plan Prescribed Dose Per Fraction: 5 Gy
Plan Total Fractions Prescribed: 10
Plan Total Prescribed Dose: 50 Gy
Reference Point Dosage Given to Date: 15 Gy
Reference Point Session Dosage Given: 5 Gy
Session Number: 3

## 2022-02-18 ENCOUNTER — Ambulatory Visit
Admission: RE | Admit: 2022-02-18 | Discharge: 2022-02-18 | Disposition: A | Discharge status: Home / Self Care | Payer: Medicare Other | Source: Ambulatory Visit | Attending: Radiation Oncology | Admitting: Radiation Oncology

## 2022-02-18 ENCOUNTER — Other Ambulatory Visit: Payer: Self-pay

## 2022-02-18 DIAGNOSIS — Z510 Encounter for antineoplastic radiation therapy: Secondary | ICD-10-CM | POA: Diagnosis not present

## 2022-02-18 LAB — RAD ONC ARIA SESSION SUMMARY
Course Elapsed Days: 3
Plan Fractions Treated to Date: 4
Plan Prescribed Dose Per Fraction: 5 Gy
Plan Total Fractions Prescribed: 10
Plan Total Prescribed Dose: 50 Gy
Reference Point Dosage Given to Date: 20 Gy
Reference Point Session Dosage Given: 5 Gy
Session Number: 4

## 2022-02-21 ENCOUNTER — Other Ambulatory Visit: Payer: Self-pay

## 2022-02-21 ENCOUNTER — Ambulatory Visit
Admission: RE | Admit: 2022-02-21 | Discharge: 2022-02-21 | Disposition: A | Discharge status: Home / Self Care | Payer: Medicare Other | Source: Ambulatory Visit | Attending: Radiation Oncology | Admitting: Radiation Oncology

## 2022-02-21 DIAGNOSIS — Z510 Encounter for antineoplastic radiation therapy: Secondary | ICD-10-CM | POA: Diagnosis not present

## 2022-02-21 LAB — RAD ONC ARIA SESSION SUMMARY
Course Elapsed Days: 6
Plan Fractions Treated to Date: 5
Plan Prescribed Dose Per Fraction: 5 Gy
Plan Total Fractions Prescribed: 10
Plan Total Prescribed Dose: 50 Gy
Reference Point Dosage Given to Date: 25 Gy
Reference Point Session Dosage Given: 5 Gy
Session Number: 5

## 2022-02-22 ENCOUNTER — Other Ambulatory Visit: Payer: Self-pay

## 2022-02-22 ENCOUNTER — Ambulatory Visit
Admission: RE | Admit: 2022-02-22 | Discharge: 2022-02-22 | Disposition: A | Discharge status: Home / Self Care | Payer: Medicare Other | Source: Ambulatory Visit | Attending: Radiation Oncology | Admitting: Radiation Oncology

## 2022-02-22 DIAGNOSIS — Z510 Encounter for antineoplastic radiation therapy: Secondary | ICD-10-CM | POA: Diagnosis not present

## 2022-02-22 LAB — RAD ONC ARIA SESSION SUMMARY
Course Elapsed Days: 7
Plan Fractions Treated to Date: 6
Plan Prescribed Dose Per Fraction: 5 Gy
Plan Total Fractions Prescribed: 10
Plan Total Prescribed Dose: 50 Gy
Reference Point Dosage Given to Date: 30 Gy
Reference Point Session Dosage Given: 5 Gy
Session Number: 6

## 2022-02-23 ENCOUNTER — Ambulatory Visit
Admission: RE | Admit: 2022-02-23 | Discharge: 2022-02-23 | Disposition: A | Discharge status: Home / Self Care | Payer: Medicare Other | Source: Ambulatory Visit | Attending: Radiation Oncology | Admitting: Radiation Oncology

## 2022-02-23 ENCOUNTER — Other Ambulatory Visit: Payer: Self-pay

## 2022-02-23 DIAGNOSIS — Z510 Encounter for antineoplastic radiation therapy: Secondary | ICD-10-CM | POA: Diagnosis not present

## 2022-02-23 LAB — RAD ONC ARIA SESSION SUMMARY
Course Elapsed Days: 8
Plan Fractions Treated to Date: 7
Plan Prescribed Dose Per Fraction: 5 Gy
Plan Total Fractions Prescribed: 10
Plan Total Prescribed Dose: 50 Gy
Reference Point Dosage Given to Date: 35 Gy
Reference Point Session Dosage Given: 5 Gy
Session Number: 7

## 2022-02-24 ENCOUNTER — Ambulatory Visit
Admission: RE | Admit: 2022-02-24 | Discharge: 2022-02-24 | Disposition: A | Discharge status: Home / Self Care | Payer: Medicare Other | Source: Ambulatory Visit | Attending: Radiation Oncology | Admitting: Radiation Oncology

## 2022-02-24 ENCOUNTER — Other Ambulatory Visit: Payer: Self-pay

## 2022-02-24 DIAGNOSIS — Z510 Encounter for antineoplastic radiation therapy: Secondary | ICD-10-CM | POA: Diagnosis not present

## 2022-02-24 LAB — RAD ONC ARIA SESSION SUMMARY
Course Elapsed Days: 9
Plan Fractions Treated to Date: 8
Plan Prescribed Dose Per Fraction: 5 Gy
Plan Total Fractions Prescribed: 10
Plan Total Prescribed Dose: 50 Gy
Reference Point Dosage Given to Date: 40 Gy
Reference Point Session Dosage Given: 5 Gy
Session Number: 8

## 2022-02-25 ENCOUNTER — Other Ambulatory Visit: Payer: Self-pay

## 2022-02-25 ENCOUNTER — Ambulatory Visit
Admission: RE | Admit: 2022-02-25 | Discharge: 2022-02-25 | Disposition: A | Discharge status: Home / Self Care | Payer: Medicare Other | Source: Ambulatory Visit | Attending: Radiation Oncology | Admitting: Radiation Oncology

## 2022-02-25 DIAGNOSIS — Z510 Encounter for antineoplastic radiation therapy: Secondary | ICD-10-CM | POA: Diagnosis not present

## 2022-02-25 LAB — RAD ONC ARIA SESSION SUMMARY
Course Elapsed Days: 10
Plan Fractions Treated to Date: 9
Plan Prescribed Dose Per Fraction: 5 Gy
Plan Total Fractions Prescribed: 10
Plan Total Prescribed Dose: 50 Gy
Reference Point Dosage Given to Date: 45 Gy
Reference Point Session Dosage Given: 5 Gy
Session Number: 9

## 2022-02-28 ENCOUNTER — Other Ambulatory Visit: Payer: Self-pay

## 2022-02-28 ENCOUNTER — Ambulatory Visit
Admission: RE | Admit: 2022-02-28 | Discharge: 2022-02-28 | Disposition: A | Discharge status: Home / Self Care | Payer: Medicare Other | Source: Ambulatory Visit | Attending: Radiation Oncology | Admitting: Radiation Oncology

## 2022-02-28 ENCOUNTER — Encounter: Payer: Self-pay | Admitting: Radiation Oncology

## 2022-02-28 DIAGNOSIS — Z510 Encounter for antineoplastic radiation therapy: Secondary | ICD-10-CM | POA: Diagnosis not present

## 2022-02-28 LAB — RAD ONC ARIA SESSION SUMMARY
Course Elapsed Days: 13
Plan Fractions Treated to Date: 10
Plan Prescribed Dose Per Fraction: 5 Gy
Plan Total Fractions Prescribed: 10
Plan Total Prescribed Dose: 50 Gy
Reference Point Dosage Given to Date: 50 Gy
Reference Point Session Dosage Given: 5 Gy
Session Number: 10

## 2022-03-08 ENCOUNTER — Telehealth: Payer: Self-pay | Admitting: Medical Oncology

## 2022-03-08 ENCOUNTER — Encounter (HOSPITAL_COMMUNITY): Payer: Self-pay | Admitting: Internal Medicine

## 2022-03-08 ENCOUNTER — Other Ambulatory Visit: Payer: Self-pay | Admitting: Internal Medicine

## 2022-03-08 NOTE — Telephone Encounter (Signed)
Molecular studies results- please call pt with results. "I'm nervous about the results ". She saw them on my chart.  Next med onc appt 02/06.

## 2022-03-09 ENCOUNTER — Other Ambulatory Visit (HOSPITAL_COMMUNITY): Payer: Self-pay

## 2022-03-25 ENCOUNTER — Telehealth: Payer: Self-pay | Admitting: Medical Oncology

## 2022-03-25 NOTE — Telephone Encounter (Signed)
Second opinion-She is going  [facility]  Appt is Jan 16th with Dr. Dereck Ligas.  Please send pathology slides to : Dr. Dereck Ligas Address- [facility] [address]  [place] , [place]  Pathology slides requested to send to Dr. Tery Sanfilippo,

## 2022-03-28 ENCOUNTER — Other Ambulatory Visit (HOSPITAL_COMMUNITY): Payer: Self-pay

## 2022-03-28 ENCOUNTER — Telehealth: Payer: Self-pay | Admitting: *Deleted

## 2022-03-28 NOTE — Telephone Encounter (Signed)
RETURNED PATIENT'S PHONE CALL, SPOKE WITH PATIENT. ?

## 2022-03-29 ENCOUNTER — Other Ambulatory Visit: Payer: Self-pay

## 2022-03-30 ENCOUNTER — Telehealth: Payer: Self-pay | Admitting: Medical Oncology

## 2022-03-30 NOTE — Telephone Encounter (Signed)
She has COVID tested positive today. She feels fine.

## 2022-03-31 ENCOUNTER — Ambulatory Visit: Payer: Medicare Other | Admitting: Radiation Oncology

## 2022-04-06 ENCOUNTER — Telehealth: Payer: Self-pay | Admitting: Medical Oncology

## 2022-04-06 NOTE — Telephone Encounter (Signed)
Sinus infection antibiotic treatment w [facility] - Per Leron Croak  , Pharm D,  there is no contraindication between Doxycycline and Tagrisso.  Azithromycin and Tagrisso  can cause prolonged QT interval.  Pt said she wants to take zpack and  if it is prescribed she will stop her  tagrisso for 5 days .

## 2022-04-14 ENCOUNTER — Other Ambulatory Visit (HOSPITAL_COMMUNITY): Payer: Self-pay

## 2022-04-20 ENCOUNTER — Encounter: Payer: Self-pay | Admitting: Radiation Oncology

## 2022-04-21 NOTE — Progress Notes (Signed)
[name] is here today for follow up post radiation to the lung.  Lung Side: left Treatment 02/15/22 to 03/02/22  Does the patient complain of any of the following: Pain:  No  Shortness of breath w/wo exertion: Yes, reports improvement.  Cough: Yes Hemoptysis: No Pain with swallowing: No Swallowing/choking concerns: No Appetite: Good Weight:  Wt Readings from Last 3 Encounters:  04/25/22 138 lb 12.8 oz (63 kg)  02/03/22 138 lb (62.6 kg)  01/27/22 139 lb 1 oz (63.1 kg)      Energy Level: Improving  Post radiation skin Changes: No    Additional comments if applicable:   BP (!) 193/79   Pulse 88   Temp 97.7 F (36.5 C)   Resp 18   Wt 138 lb 12.8 oz (63 kg)   SpO2 100%   BMI 23.28 kg/m

## 2022-04-24 NOTE — Progress Notes (Incomplete)
  Radiation Oncology         [phone] ________________________________  Patient Name: [name] MRN: [id number] DOB: 24-May-1950 Referring Physician: Lillette Boxer D Date of Service: 02/28/2022 [facility] [place] [facility], [place]                                                        End Of Treatment Note  Diagnoses: C34.92-Malignant neoplasm of unspecified part of left bronchus or lung  Cancer Staging: The encounter diagnosis was Adenocarcinoma of left lung ([place]).   Recurrent non-small cell lung cancer initially diagnosed as stage IIA (T2b, N0, M0) non-small cell lung cancer, adenocarcinoma diagnosed in November 2019; positive EGFR mutation in exon 21 (L858R) status post left lower lobectomy with lymph node dissection under the care of Dr. Roxan Hockey. She had a recurrence in 2021 and started tagrisso. Recently diagnosed with recurrence in the left lung with biopsy showing metastatic adenocarcinoma (October 2023)    Intent: Curative  Radiation Treatment Dates: 02/15/2022 through 02/28/2022 Site Technique Total Dose (Gy) Dose per Fx (Gy) Completed Fx Beam Energies  Lung, Left: Lung_L IMRT 50/50 5 10/10 [id number]   Narrative: The patient tolerated radiation therapy relatively well. On the date of her final treatment, the patient endorsed fatigue, a rash from [place] on her central chest, and increased productive cough. She denied any worsening in her breathing or other side effects.   Plan: The patient will follow-up with radiation oncology in one month .  ________________________________________________ -----------------------------------  Blair Promise, PhD, MD  This document serves as a record of services personally performed by Gery Pray, MD. It was created on his behalf by Roney Mans, a trained medical scribe. The creation of this record is based on the scribe's personal observations and the provider's statements to them. This document has been checked and approved  by the attending provider.

## 2022-04-24 NOTE — Progress Notes (Signed)
Radiation Oncology         [phone]) [phone] ________________________________  Name: [name] MRN: [id number]  Date: 04/25/2022  DOB: 08/04/1950  Follow-Up Visit Note  CC: [name], MD  [name], MD    ICD-10-CM   1. Adenocarcinoma of left lung (HCC)  C34.92       Diagnosis: The encounter diagnosis was Adenocarcinoma of left lung (HCC).   Recurrent non-small cell lung cancer initially diagnosed as stage IIA (T2b, N0, M0) non-small cell lung cancer, adenocarcinoma diagnosed in November 2019; positive EGFR mutation in exon 21 (L858R) status post left lower lobectomy with lymph node dissection under the care of Dr. Dorris Fetch. She had a recurrence in 2021 and started tagrisso. Recently diagnosed with recurrence in the left lung with biopsy showing metastatic adenocarcinoma (October 2023)    Interval Since Last Radiation: 1 month and 26 days   Intent: Curative  Radiation Treatment Dates: 02/15/2022 through 02/28/2022 Site Technique Total Dose (Gy) Dose per Fx (Gy) Completed Fx Beam Energies  Lung, Left: Lung_L IMRT 50/50 5 10/10 6XFFF   Narrative:  The patient returns today for routine follow-up. The patient tolerated radiation therapy relatively well. On the date of her final treatment, the patient endorsed fatigue, a rash from Tagrisso on her central chest, and increased productive cough. She denied any worsening in her breathing or other side effects.     No significant oncologic interval history since the patient completed her final radiation treatment.   She reports contracting COVID but is recovering well from this issue.  She continues to have some fatigue but no respiratory issues.  She denies any pain within the chest area significant cough or hemoptysis.                             Allergies:  is allergic to avocado, influenza vaccines, lisinopril, metoprolol, nexium [esomeprazole magnesium], penicillins, pork-derived products, proton pump inhibitors, sulfites,  wasp venom protein, zantac [ranitidine hcl], cefaclor, chlorhexidine, chloroxine, ciprofloxacin hcl, clarithromycin, gatifloxacin, other, sulfa antibiotics, sulfamethoxazole-trimethoprim, buckwheat, cat hair extract, dog epithelium allergy skin test, eggs or egg-derived products, fluogen [influenza virus vaccine], pollen extract, short ragweed pollen ext, sulfur, aspartame, chocolate, dilaudid [hydromorphone hcl], fentanyl, ivp dye [iodinated contrast media], ketorolac, milk-related compounds, molds & smuts, oxycodone, prednisolone, shellfish allergy, soap, and tilactase.  Meds: Current Outpatient Medications  Medication Sig Dispense Refill   Carboxymethylcellul-Glycerin (LUBRICATING EYE DROPS OP) Place 1 drop into both eyes daily as needed (dry eyes).     Cholecalciferol (VITAMIN D3) LIQD Take 2,000 Units by mouth daily.     Cyanocobalamin (VITAMIN B-12) 1000 MCG SUBL Place 1 tablet under the tongue once a week.     diltiazem (CARDIZEM CD) 120 MG 24 hr capsule Take 120 mg by mouth daily.     EPINEPHrine 0.3 mg/0.3 mL IJ SOAJ injection Inject 0.3 mg into the muscle as needed for anaphylaxis.     famotidine (PEPCID) 40 MG tablet Take 40 mg by mouth daily.     Multiple Vitamin (MULTIVITAMIN) tablet Take 1 tablet by mouth daily.     Omega 3-6-9 Fatty Acids (OMEGA-3 FUSION) LIQD Take by mouth.     osimertinib mesylate (TAGRISSO) 80 MG tablet TAKE 1 TABLET (80 MG TOTAL) BY MOUTH DAILY. 90 tablet 1   OVER THE COUNTER MEDICATION Iron gummies.     PRESCRIPTION MEDICATION Apply 1 application topically 2 (two) times daily as needed for itching. Dr Dallas Schimke 2019  rizatriptan (MAXALT) 10 MG tablet Take 5 mg by mouth as needed for migraine. May repeat in 2 hours if needed (Patient not taking: Reported on 02/03/2022)     No current facility-administered medications for this encounter.    Physical Findings: The patient is in no acute distress. Patient is alert and oriented.  weight is 138 lb 12.8 oz (63  kg). Her temperature is 97.7 F (36.5 C). Her blood pressure is 169/67 (abnormal) and her pulse is 88. Her respiration is 18 and oxygen saturation is 100%.  . Lungs are clear to auscultation bilaterally. Heart has regular rate and rhythm. No palpable cervical, supraclavicular, or axillary adenopathy. Abdomen soft, non-tender, normal bowel sounds.   Lab Findings: Lab Results  Component Value Date   WBC 5.7 04/25/2022   HGB 9.4 (L) 04/25/2022   HCT 28.2 (L) 04/25/2022   MCV 96.6 04/25/2022   PLT 267 04/25/2022    Radiographic Findings: CT Chest Wo Contrast  Result Date: 04/25/2022 CLINICAL DATA:  Non-small-cell lung cancer. Restaging. * Tracking Code: BO * EXAM: CT CHEST WITHOUT CONTRAST TECHNIQUE: Multidetector CT imaging of the chest was performed following the standard protocol without IV contrast. RADIATION DOSE REDUCTION: This exam was performed according to the departmental dose-optimization program which includes automated exposure control, adjustment of the mA and/or kV according to patient size and/or use of iterative reconstruction technique. COMPARISON:  01/03/2022 FINDINGS: Cardiovascular: The heart size is normal. No substantial pericardial effusion. Coronary artery calcification is evident. Mild atherosclerotic calcification is noted in the wall of the thoracic aorta. Mediastinum/Nodes: No mediastinal lymphadenopathy. No evidence for gross hilar lymphadenopathy although assessment is limited by the lack of intravenous contrast on the current study. The esophagus has normal imaging features. There is no axillary lymphadenopathy. Lungs/Pleura: Stable volume loss left hemithorax compatible with prior left lower lobectomy. There is no new suspicious pulmonary nodule or mass in the right lung. Small subpleural focus of nodular atelectasis noted posterior right lower lobe on 86/7. Interval development of interstitial and consolidative airspace disease in the left lower lobe and retro hilar left  upper lobe, compatible with changes secondary to radiation therapy. This is associated with plaque-like pleural thickening posteriorly and adjacent bandlike consolidative opacity in the adjacent lung, also most likely related to the radiation therapy. Component of tiny loculated left pleural effusion would also be a consideration. The most cranial paraspinal left subpleural nodule measured previously at 13 x 10 mm is visible on 45/4 today, incorporated into the new post treatment change/scarring. This cannot be reliably measured today given the surrounding post treatment soft tissue attenuation. A second 6 mm nodule seen previously is visible on image 56/4 today measuring 6 mm again and also having become incorporated into post treatment change. The 1.4 x 0.9 cm pleural based paraspinal left nodule seen previously is not discernible today. The most inferior measured nodule previously at 13 x 7 mm is seen on image 69/4 today, stable at 13 x 7 mm with surrounding post treatment scarring. No new suspicious pulmonary nodule or mass in the left lung. Upper Abdomen: 6 mm distal paraesophageal nodule seen previously is stable on image 103/4 today. A second nodule adjacent to the esophagogastric junction measured previously at 8 mm is similar today on 108/4. Visualized portions of the upper abdomen otherwise unremarkable. Musculoskeletal: No worrisome lytic or sclerotic osseous abnormality. IMPRESSION: 1. Interval development of post treatment scarring along the posterior left pleura as well as within the parenchyma of the posterior left lung. The pleural  based plaque-like metastases seen on the prior study have become incorporated into the post treatment changes. Those pleural nodules that are discernible today are stable in size in the interval. 2. No new or progressive findings on today's exam to raise concern for disease progression. 3. The tiny distal paraesophageal nodule and tiny nodule adjacent to the GE junction  remain stable. 4.  Aortic Atherosclerois (ICD10-170.0) Electronically Signed   By: Kennith Center M.D.   On: 04/25/2022 12:24    Impression: The encounter diagnosis was Adenocarcinoma of left lung (HCC).   Recurrent non-small cell lung cancer initially diagnosed as stage IIA (T2b, N0, M0) non-small cell lung cancer, adenocarcinoma diagnosed in November 2019; positive EGFR mutation in exon 21 (L858R) status post left lower lobectomy with lymph node dissection under the care of Dr. Dorris Fetch. She had a recurrence in 2021 and started tagrisso. Recently diagnosed with recurrence in the left lung with biopsy showing metastatic adenocarcinoma (October 2023)    She is recovering well from her radiation therapy.  Recent chest CT scan favorable.  Plan: As needed follow-up in radiation oncology.  She will continue close follow-up in medical oncology and likely continue on Tagrisso.    ____________________________________  [name], PhD, MD  This document serves as a record of services personally performed by Antony Blackbird, MD. It was created on his behalf by Neena Rhymes, a trained medical scribe. The creation of this record is based on the scribe's personal observations and the provider's statements to them. This document has been checked and approved by the attending provider.

## 2022-04-25 ENCOUNTER — Other Ambulatory Visit (HOSPITAL_COMMUNITY): Payer: Self-pay

## 2022-04-25 ENCOUNTER — Encounter: Payer: Self-pay | Admitting: Radiation Oncology

## 2022-04-25 ENCOUNTER — Ambulatory Visit
Admission: RE | Admit: 2022-04-25 | Discharge: 2022-04-25 | Disposition: A | Discharge status: Home / Self Care | Payer: Medicare Other | Source: Ambulatory Visit | Attending: Radiation Oncology | Admitting: Radiation Oncology

## 2022-04-25 ENCOUNTER — Inpatient Hospital Stay: Payer: Medicare Other | Attending: Internal Medicine | Admitting: Internal Medicine

## 2022-04-25 ENCOUNTER — Ambulatory Visit (HOSPITAL_COMMUNITY)
Admission: RE | Admit: 2022-04-25 | Discharge: 2022-04-25 | Disposition: A | Discharge status: Home / Self Care | Payer: Medicare Other | Source: Ambulatory Visit | Attending: Internal Medicine | Admitting: Internal Medicine

## 2022-04-25 ENCOUNTER — Inpatient Hospital Stay: Payer: Medicare Other

## 2022-04-25 VITALS — BP 169/67 | HR 88 | Temp 97.7°F | Resp 18 | Wt 138.8 lb

## 2022-04-25 VITALS — BP 167/76 | HR 88 | Temp 97.7°F | Resp 18 | Wt 138.0 lb

## 2022-04-25 DIAGNOSIS — C3492 Malignant neoplasm of unspecified part of left bronchus or lung: Secondary | ICD-10-CM | POA: Diagnosis present

## 2022-04-25 DIAGNOSIS — K219 Gastro-esophageal reflux disease without esophagitis: Secondary | ICD-10-CM | POA: Diagnosis not present

## 2022-04-25 DIAGNOSIS — Z79899 Other long term (current) drug therapy: Secondary | ICD-10-CM | POA: Insufficient documentation

## 2022-04-25 DIAGNOSIS — C3490 Malignant neoplasm of unspecified part of unspecified bronchus or lung: Secondary | ICD-10-CM | POA: Insufficient documentation

## 2022-04-25 DIAGNOSIS — D649 Anemia, unspecified: Secondary | ICD-10-CM | POA: Insufficient documentation

## 2022-04-25 DIAGNOSIS — R0609 Other forms of dyspnea: Secondary | ICD-10-CM | POA: Diagnosis not present

## 2022-04-25 HISTORY — DX: Personal history of irradiation: Z92.3

## 2022-04-25 LAB — CBC WITH DIFFERENTIAL (CANCER CENTER ONLY)
Abs Immature Granulocytes: 0.02 10*3/uL (ref 0.00–0.07)
Basophils Absolute: 0 10*3/uL (ref 0.0–0.1)
Basophils Relative: 0 %
Eosinophils Absolute: 0.1 10*3/uL (ref 0.0–0.5)
Eosinophils Relative: 2 %
HCT: 28.2 % — ABNORMAL LOW (ref 36.0–46.0)
Hemoglobin: 9.4 g/dL — ABNORMAL LOW (ref 12.0–15.0)
Immature Granulocytes: 0 %
Lymphocytes Relative: 23 %
Lymphs Abs: 1.3 10*3/uL (ref 0.7–4.0)
MCH: 32.2 pg (ref 26.0–34.0)
MCHC: 33.3 g/dL (ref 30.0–36.0)
MCV: 96.6 fL (ref 80.0–100.0)
Monocytes Absolute: 0.7 10*3/uL (ref 0.1–1.0)
Monocytes Relative: 11 %
Neutro Abs: 3.6 10*3/uL (ref 1.7–7.7)
Neutrophils Relative %: 64 %
Platelet Count: 267 10*3/uL (ref 150–400)
RBC: 2.92 MIL/uL — ABNORMAL LOW (ref 3.87–5.11)
RDW: 13.2 % (ref 11.5–15.5)
WBC Count: 5.7 10*3/uL (ref 4.0–10.5)
nRBC: 0 % (ref 0.0–0.2)

## 2022-04-25 LAB — CMP (CANCER CENTER ONLY)
ALT: 10 U/L (ref 0–44)
AST: 20 U/L (ref 15–41)
Albumin: 4 g/dL (ref 3.5–5.0)
Alkaline Phosphatase: 51 U/L (ref 38–126)
Anion gap: 5 (ref 5–15)
BUN: 16 mg/dL (ref 8–23)
CO2: 28 mmol/L (ref 22–32)
Calcium: 9.2 mg/dL (ref 8.9–10.3)
Chloride: 99 mmol/L (ref 98–111)
Creatinine: 0.91 mg/dL (ref 0.44–1.00)
GFR, Estimated: >60 mL/min (ref >60)
Glucose, Bld: 86 mg/dL (ref 70–99)
Potassium: 4 mmol/L (ref 3.5–5.1)
Sodium: 132 mmol/L — ABNORMAL LOW (ref 135–145)
Total Bilirubin: 0.3 mg/dL (ref 0.3–1.2)
Total Protein: 6 g/dL — ABNORMAL LOW (ref 6.5–8.1)

## 2022-04-25 NOTE — Progress Notes (Signed)
[facility] [phone]   [phone]  OFFICE PROGRESS NOTE  Donald Prose, MD [place] [address]  DIAGNOSIS: Recurrent non-small cell lung cancer initially diagnosed as stage IIA (T2b, N0, M0) non-small cell lung cancer, adenocarcinoma diagnosed in November 2019  Molecular studies showed EGFR mutation in exon 21 (L858R) PD-L1 expression 0  PRIOR THERAPY:  1) status post left lower lobectomy with lymph node dissection on April 25, 2018 under the care of Dr. Roxan Hockey.  She declined adjuvant therapy 2) status post palliative radiotherapy with IMRT to the progressive pleural-based lung masses under the care of Dr. Sondra Come completed on February 28, 2022.  CURRENT THERAPY: Osimertinib (Tagrisso) 80 mg p.o. daily.  First dose was on June 19, 2019.  Status post 34 months of treatment.  INTERVAL HISTORY: [name] 72 y.o. female returns to the clinic today for follow-up visit accompanied by her husband.  The patient is feeling fine today with no concerning complaints except for fatigue.  She was diagnosed with COVID-19 few weeks ago.  She denied having any current chest pain, shortness of breath, cough or hemoptysis.  She has no nausea, vomiting, diarrhea or constipation.  She has no headache or visual changes.  She tolerated the previous course of palliative radiotherapy under the care of Dr. Sondra Come fairly well.  She also continues to tolerate her treatment with Tagrisso fairly well.  She is here today for evaluation with repeat CT scan of the chest for restaging of her disease.   MEDICAL HISTORY: Past Medical History:  Diagnosis Date   Anemia    Atherosclerosis    GERD (gastroesophageal reflux disease)    Hemoptysis    History of radiation therapy    Left Lung- 02/15/22-02/28/22- Dr. Gery Pray   Hyperlipidemia    Hypertension    IBS (irritable bowel syndrome)    Lung mass    Migraines    Spondylosis    Vitamin D  deficiency     ALLERGIES:  is allergic to avocado, influenza vaccines, lisinopril, metoprolol, nexium [esomeprazole magnesium], penicillins, pork-derived products, proton pump inhibitors, sulfites, wasp venom protein, zantac [ranitidine hcl], cefaclor, chlorhexidine, chloroxine, ciprofloxacin hcl, clarithromycin, gatifloxacin, other, sulfa antibiotics, sulfamethoxazole-trimethoprim, buckwheat, cat hair extract, dog epithelium allergy skin test, eggs or egg-derived products, fluogen [influenza virus vaccine], pollen extract, short ragweed pollen ext, sulfur, aspartame, chocolate, dilaudid [hydromorphone hcl], fentanyl, ivp dye [iodinated contrast media], ketorolac, milk-related compounds, molds & smuts, oxycodone, prednisolone, shellfish allergy, soap, and tilactase.  MEDICATIONS:  Current Outpatient Medications  Medication Sig Dispense Refill   Carboxymethylcellul-Glycerin (LUBRICATING EYE DROPS OP) Place 1 drop into both eyes daily as needed (dry eyes).     Cholecalciferol (VITAMIN D3) LIQD Take 2,000 Units by mouth daily.     Cyanocobalamin (VITAMIN B-12) 1000 MCG SUBL Place 1 tablet under the tongue once a week.     diltiazem (CARDIZEM CD) 120 MG 24 hr capsule Take 120 mg by mouth daily.     EPINEPHrine 0.3 mg/0.3 mL IJ SOAJ injection Inject 0.3 mg into the muscle as needed for anaphylaxis.     famotidine (PEPCID) 40 MG tablet Take 40 mg by mouth daily.     Multiple Vitamin (MULTIVITAMIN) tablet Take 1 tablet by mouth daily.     Omega 3-6-9 Fatty Acids (OMEGA-3 FUSION) LIQD Take by mouth.     osimertinib mesylate (TAGRISSO) 80 MG tablet TAKE 1 TABLET (80 MG TOTAL) BY MOUTH DAILY. 90 tablet 1  OVER THE COUNTER MEDICATION Iron gummies.     PRESCRIPTION MEDICATION Apply 1 application topically 2 (two) times daily as needed for itching. Dr Molinda Bailiff 2019     rizatriptan (MAXALT) 10 MG tablet Take 5 mg by mouth as needed for migraine. May repeat in 2 hours if needed (Patient not taking: Reported on  02/03/2022)     No current facility-administered medications for this visit.    SURGICAL HISTORY:  Past Surgical History:  Procedure Laterality Date   BROW LIFT AND BLEPHAROPLASTY  03/15/2013   CATARACT EXTRACTION, BILATERAL  06/23/2014   CHEST TUBE INSERTION Left 05/10/2018   Procedure: CHEST TUBE INSERTION;  Surgeon: [name], MD;  Location: [facility];  Service: Thoracic;  Laterality: Left;   COLONOSCOPY  05/2014   ESOPHAGOGASTRODUODENOSCOPY  2019   EYE SURGERY     IR THORACENTESIS ASP PLEURAL SPACE W/IMG GUIDE  05/24/2018   IR THORACENTESIS ASP PLEURAL SPACE W/IMG GUIDE  06/14/2018   TONSILLECTOMY     VIDEO ASSISTED THORACOSCOPY (VATS)/ LOBECTOMY Left 04/25/2018   Procedure: VIDEO ASSISTED THORACOSCOPY (VATS)LEFT LOWER LOBECTOMY, NODE DISSECTION;  Surgeon: [name], MD;  Location: [facility];  Service: Thoracic;  Laterality: Left;   VIDEO BRONCHOSCOPY WITH ENDOBRONCHIAL NAVIGATION N/A 03/07/2018   Procedure: VIDEO BRONCHOSCOPY WITH ENDOBRONCHIAL NAVIGATION;  Surgeon: Collene Gobble, MD;  Location: [facility] OR;  Service: Thoracic;  Laterality: N/A;   VIDEO BRONCHOSCOPY WITH INSERTION OF INTERBRONCHIAL VALVE (IBV) N/A 05/10/2018   Procedure: VIDEO BRONCHOSCOPY WITH INSERTION OF INTERBRONCHIAL VALVE (IBV);  Surgeon: [name], MD;  Location[facility];  Service: Thoracic;  Laterality: N/A;   VIDEO BRONCHOSCOPY WITH INSERTION OF INTERBRONCHIAL VALVE (IBV) N/A 06/13/2018   Procedure: VIDEO BRONCHOSCOPY WITH REMOVAL OF INTERBRONCHIAL VALVE (IBV) x 2;  Surgeon: [name], MD;  Location: [facility];  Service: Thoracic;  Laterality: N/A;    REVIEW OF SYSTEMS:  Constitutional: positive for fatigue Eyes: negative Ears, nose, mouth, throat, and face: negative Respiratory: positive for cough Cardiovascular: negative Gastrointestinal: negative Genitourinary:negative Integument/breast: negative Hematologic/lymphatic: negative Musculoskeletal:negative Neurological:  negative Behavioral/Psych: negative Endocrine: negative Allergic/Immunologic: negative   PHYSICAL EXAMINATION: General appearance: alert, cooperative, fatigued, and no distress Head: Normocephalic, without obvious abnormality, atraumatic Neck: no adenopathy, no JVD, supple, symmetrical, trachea midline, and thyroid not enlarged, symmetric, no tenderness/mass/nodules Lymph nodes: Cervical, supraclavicular, and axillary nodes normal. Resp: clear to auscultation bilaterally Back: symmetric, no curvature. ROM normal. No CVA tenderness. Cardio: regular rate and rhythm, S1, S2 normal, no murmur, click, rub or gallop GI: soft, non-tender; bowel sounds normal; no masses,  no organomegaly Extremities: extremities normal, atraumatic, no cyanosis or edema Neurologic: Alert and oriented X 3, normal strength and tone. Normal symmetric reflexes. Normal coordination and gait   ECOG PERFORMANCE STATUS: 1 - Symptomatic but completely ambulatory  Blood pressure (!) 167/76, pulse 88, temperature 97.7 F (36.5 C), temperature source Oral, resp. rate 18, weight 138 lb (62.6 kg), SpO2 100 %.   LABORATORY DATA: Lab Results  Component Value Date   WBC 5.7 04/25/2022   HGB 9.4 (L) 04/25/2022   HCT 28.2 (L) 04/25/2022   MCV 96.6 04/25/2022   PLT 267 04/25/2022      Chemistry      Component Value Date/Time   NA 132 (L) 04/25/2022 1051   K 4.0 04/25/2022 1051   CL 99 04/25/2022 1051   CO2 28 04/25/2022 1051   BUN 16 04/25/2022 1051   CREATININE 0.91 04/25/2022 1051      Component Value  Date/Time   CALCIUM 9.2 04/25/2022 1051   ALKPHOS 51 04/25/2022 1051   AST 20 04/25/2022 1051   ALT 10 04/25/2022 1051   BILITOT 0.3 04/25/2022 1051       RADIOGRAPHIC STUDIES: CT Chest Wo Contrast  Result Date: 04/25/2022 CLINICAL DATA:  Non-small-cell lung cancer. Restaging. * Tracking Code: BO * EXAM: CT CHEST WITHOUT CONTRAST TECHNIQUE: Multidetector CT imaging of the chest was performed following the  standard protocol without IV contrast. RADIATION DOSE REDUCTION: This exam was performed according to the departmental dose-optimization program which includes automated exposure control, adjustment of the mA and/or kV according to patient size and/or use of iterative reconstruction technique. COMPARISON:  01/03/2022 FINDINGS: Cardiovascular: The heart size is normal. No substantial pericardial effusion. Coronary artery calcification is evident. Mild atherosclerotic calcification is noted in the wall of the thoracic aorta. Mediastinum/Nodes: No mediastinal lymphadenopathy. No evidence for gross hilar lymphadenopathy although assessment is limited by the lack of intravenous contrast on the current study. The esophagus has normal imaging features. There is no axillary lymphadenopathy. Lungs/Pleura: Stable volume loss left hemithorax compatible with prior left lower lobectomy. There is no new suspicious pulmonary nodule or mass in the right lung. Small subpleural focus of nodular atelectasis noted posterior right lower lobe on 86/7. Interval development of interstitial and consolidative airspace disease in the left lower lobe and retro hilar left upper lobe, compatible with changes secondary to radiation therapy. This is associated with plaque-like pleural thickening posteriorly and adjacent bandlike consolidative opacity in the adjacent lung, also most likely related to the radiation therapy. Component of tiny loculated left pleural effusion would also be a consideration. The most cranial paraspinal left subpleural nodule measured previously at 13 x 10 mm is visible on 45/4 today, incorporated into the new post treatment change/scarring. This cannot be reliably measured today given the surrounding post treatment soft tissue attenuation. A second 6 mm nodule seen previously is visible on image 56/4 today measuring 6 mm again and also having become incorporated into post treatment change. The 1.4 x 0.9 cm pleural based  paraspinal left nodule seen previously is not discernible today. The most inferior measured nodule previously at 13 x 7 mm is seen on image 69/4 today, stable at 13 x 7 mm with surrounding post treatment scarring. No new suspicious pulmonary nodule or mass in the left lung. Upper Abdomen: 6 mm distal paraesophageal nodule seen previously is stable on image 103/4 today. A second nodule adjacent to the esophagogastric junction measured previously at 8 mm is similar today on 108/4. Visualized portions of the upper abdomen otherwise unremarkable. Musculoskeletal: No worrisome lytic or sclerotic osseous abnormality. IMPRESSION: 1. Interval development of post treatment scarring along the posterior left pleura as well as within the parenchyma of the posterior left lung. The pleural based plaque-like metastases seen on the prior study have become incorporated into the post treatment changes. Those pleural nodules that are discernible today are stable in size in the interval. 2. No new or progressive findings on today's exam to raise concern for disease progression. 3. The tiny distal paraesophageal nodule and tiny nodule adjacent to the GE junction remain stable. 4.  Aortic Atherosclerois (ICD10-170.0) Electronically Signed   By: Misty Stanley M.D.   On: 04/25/2022 12:24    ASSESSMENT AND PLAN: This is a very pleasant 72 years old white female with a stage IIa non-small cell lung cancer, adenocarcinoma with positive EGFR mutation in exon 21 (L858R) status post left lower lobectomy with lymph node  dissection under the care of Dr. Roxan Hockey. She declined to proceed with adjuvant systemic chemotherapy or targeted therapy. The patient was on observation since her surgical resection until she had disease recurrence in February 2021 presented with pleural-based masses in the left lung with new biopsy and molecular studies positive for EGFR mutation exon 21 (L858R). The patient started treatment with Tagrisso 80 mg p.o.  daily status post almost 34 months of treatment.  She has been tolerating her treatment with Tagrisso fairly well with no concerning adverse effects except for mild rash and diarrhea. Imaging studies including CT scan of the chest followed by PET scan in August 2023 showed disease progression and the pleural-based masses in the left lung. The patient underwent CT-guided core biopsy of one of the pleural-based left lung progressive lesion and the final pathology was consistent with metastatic adenocarcinoma. The molecular studies by foundation 1 and it was positive for EGFR L718V, L858R.  She has negative PD-L1 expression. The patient was referred to Dr. Sondra Come and she underwent IMRT to the progressive lesion of the left lung pleural-based masses.  She continued her treatment with Tagrisso at that time. She is here today for evaluation with repeat CT scan of the chest.  I personally and independently reviewed the scan images and discussed the results with the patient and her husband.  Her scan showed interval development of posttreatment scarring along the posterior left pleura as well as within the parenchyma of the posterior left lung but no concerning findings for progression.  And no other new or progressive findings for concern about disease progression. I had a lengthy discussion with the patient and her husband about her current condition and treatment options. In the absence of any disease progression after the palliative radiotherapy, I recommended for the patient to continue her current treatment with Tagrisso for now. If she develop any further disease progression that is not amenable for SBRT, I may consider changing her treatment to a Afatinib which works on uncommon mutation like L718V in addition to L858R. The patient has a second opinion at [facility] and expected to see Dr. Gregary Signs Rotow in the next 1-2 weeks. I will see the patient back for follow-up visit in 3 months for evaluation with  repeat lab and as well as CT scan of the chest for restaging of her disease. For the anemia, she will continue on the oral iron tablets. For the dyspepsia she is currently on Pepcid on as-needed basis. She was advised to call immediately if she has any other concerning symptoms in the interval. The total time spent in the appointment was 40 minutes. The patient voices understanding of current disease status and treatment options and is in agreement with the current care plan. All questions were answered. The patient knows to call the clinic with any problems, questions or concerns. We can certainly see the patient much sooner if necessary.  Disclaimer: This note was dictated with voice recognition software. Similar sounding words can inadvertently be transcribed and may not be corrected upon review.

## 2022-04-26 ENCOUNTER — Other Ambulatory Visit (HOSPITAL_COMMUNITY): Payer: Self-pay

## 2022-04-27 ENCOUNTER — Other Ambulatory Visit: Payer: Self-pay

## 2022-04-27 ENCOUNTER — Other Ambulatory Visit (HOSPITAL_COMMUNITY): Payer: Self-pay

## 2022-04-27 ENCOUNTER — Telehealth: Payer: Self-pay | Admitting: Pharmacy Technician

## 2022-04-27 NOTE — Telephone Encounter (Signed)
Oral Oncology Patient Advocate Encounter   Was successful in securing patient a $5,000 grant from [employer] (PAF) to provide copayment coverage for Tagrisso.  This will keep the out of pocket expense at $0.     I have spoken with the patient.    The billing information is as follows and has been shared with [employer]   RxBin: [id number] PCN:  [id number] Member ID: [id number] Group ID: [id number] Dates of Eligibility: 04/27/22 through 04/27/23  Lady Deutscher, CPhT-Adv Oncology [facility] [place] Direct Number: [phone]  Fax: [phone]

## 2022-04-28 ENCOUNTER — Telehealth: Payer: Self-pay | Admitting: Medical Oncology

## 2022-04-28 NOTE — Telephone Encounter (Signed)
Rose requests path reports and molecular reports from 2020. Records faxed.

## 2022-05-06 ENCOUNTER — Other Ambulatory Visit (HOSPITAL_COMMUNITY): Payer: Self-pay

## 2022-05-20 ENCOUNTER — Other Ambulatory Visit (HOSPITAL_COMMUNITY): Payer: Self-pay

## 2022-05-24 ENCOUNTER — Ambulatory Visit (HOSPITAL_COMMUNITY): Payer: Medicare Other

## 2022-05-24 ENCOUNTER — Other Ambulatory Visit: Payer: Medicare Other

## 2022-05-24 ENCOUNTER — Ambulatory Visit: Payer: Medicare Other | Admitting: Internal Medicine

## 2022-06-03 ENCOUNTER — Other Ambulatory Visit: Payer: Self-pay

## 2022-06-09 ENCOUNTER — Other Ambulatory Visit (HOSPITAL_COMMUNITY): Payer: Self-pay

## 2022-06-09 ENCOUNTER — Encounter (HOSPITAL_COMMUNITY): Payer: Self-pay | Admitting: Internal Medicine

## 2022-06-28 ENCOUNTER — Other Ambulatory Visit (HOSPITAL_COMMUNITY): Payer: Self-pay

## 2022-06-29 ENCOUNTER — Other Ambulatory Visit (HOSPITAL_COMMUNITY): Payer: Self-pay

## 2022-07-26 ENCOUNTER — Inpatient Hospital Stay: Payer: Medicare Other | Attending: Internal Medicine

## 2022-07-26 ENCOUNTER — Ambulatory Visit (HOSPITAL_COMMUNITY)
Admission: RE | Admit: 2022-07-26 | Discharge: 2022-07-26 | Disposition: A | Discharge status: Home / Self Care | Payer: Medicare Other | Source: Ambulatory Visit | Attending: Internal Medicine | Admitting: Internal Medicine

## 2022-07-26 ENCOUNTER — Inpatient Hospital Stay (HOSPITAL_BASED_OUTPATIENT_CLINIC_OR_DEPARTMENT_OTHER): Payer: Medicare Other | Admitting: Internal Medicine

## 2022-07-26 ENCOUNTER — Other Ambulatory Visit: Payer: Self-pay

## 2022-07-26 ENCOUNTER — Other Ambulatory Visit: Payer: Self-pay | Admitting: Internal Medicine

## 2022-07-26 ENCOUNTER — Other Ambulatory Visit (HOSPITAL_COMMUNITY): Payer: Self-pay

## 2022-07-26 VITALS — BP 156/66 | HR 81 | Temp 97.7°F | Resp 15 | Ht 64.75 in | Wt 136.8 lb

## 2022-07-26 DIAGNOSIS — C7951 Secondary malignant neoplasm of bone: Secondary | ICD-10-CM | POA: Insufficient documentation

## 2022-07-26 DIAGNOSIS — Z888 Allergy status to other drugs, medicaments and biological substances status: Secondary | ICD-10-CM | POA: Insufficient documentation

## 2022-07-26 DIAGNOSIS — Z880 Allergy status to penicillin: Secondary | ICD-10-CM | POA: Insufficient documentation

## 2022-07-26 DIAGNOSIS — Z887 Allergy status to serum and vaccine status: Secondary | ICD-10-CM | POA: Diagnosis not present

## 2022-07-26 DIAGNOSIS — Z882 Allergy status to sulfonamides status: Secondary | ICD-10-CM | POA: Insufficient documentation

## 2022-07-26 DIAGNOSIS — Z923 Personal history of irradiation: Secondary | ICD-10-CM | POA: Insufficient documentation

## 2022-07-26 DIAGNOSIS — R5383 Other fatigue: Secondary | ICD-10-CM | POA: Insufficient documentation

## 2022-07-26 DIAGNOSIS — C3432 Malignant neoplasm of lower lobe, left bronchus or lung: Secondary | ICD-10-CM | POA: Diagnosis present

## 2022-07-26 DIAGNOSIS — Z79899 Other long term (current) drug therapy: Secondary | ICD-10-CM | POA: Insufficient documentation

## 2022-07-26 DIAGNOSIS — C3492 Malignant neoplasm of unspecified part of left bronchus or lung: Secondary | ICD-10-CM

## 2022-07-26 DIAGNOSIS — C3490 Malignant neoplasm of unspecified part of unspecified bronchus or lung: Secondary | ICD-10-CM

## 2022-07-26 DIAGNOSIS — Z881 Allergy status to other antibiotic agents status: Secondary | ICD-10-CM | POA: Diagnosis not present

## 2022-07-26 DIAGNOSIS — R1013 Epigastric pain: Secondary | ICD-10-CM | POA: Insufficient documentation

## 2022-07-26 DIAGNOSIS — Z91041 Radiographic dye allergy status: Secondary | ICD-10-CM | POA: Insufficient documentation

## 2022-07-26 DIAGNOSIS — I700 Atherosclerosis of aorta: Secondary | ICD-10-CM | POA: Insufficient documentation

## 2022-07-26 DIAGNOSIS — M79672 Pain in left foot: Secondary | ICD-10-CM | POA: Diagnosis not present

## 2022-07-26 DIAGNOSIS — Z902 Acquired absence of lung [part of]: Secondary | ICD-10-CM | POA: Insufficient documentation

## 2022-07-26 DIAGNOSIS — Z885 Allergy status to narcotic agent status: Secondary | ICD-10-CM | POA: Diagnosis not present

## 2022-07-26 LAB — CBC WITH DIFFERENTIAL (CANCER CENTER ONLY)
Abs Immature Granulocytes: 0.02 10*3/uL (ref 0.00–0.07)
Basophils Absolute: 0 10*3/uL (ref 0.0–0.1)
Basophils Relative: 0 %
Eosinophils Absolute: 0.1 10*3/uL (ref 0.0–0.5)
Eosinophils Relative: 1 %
HCT: 29.4 % — ABNORMAL LOW (ref 36.0–46.0)
Hemoglobin: 9.9 g/dL — ABNORMAL LOW (ref 12.0–15.0)
Immature Granulocytes: 0 %
Lymphocytes Relative: 21 %
Lymphs Abs: 1.3 10*3/uL (ref 0.7–4.0)
MCH: 32.5 pg (ref 26.0–34.0)
MCHC: 33.7 g/dL (ref 30.0–36.0)
MCV: 96.4 fL (ref 80.0–100.0)
Monocytes Absolute: 0.6 10*3/uL (ref 0.1–1.0)
Monocytes Relative: 9 %
Neutro Abs: 4.3 10*3/uL (ref 1.7–7.7)
Neutrophils Relative %: 69 %
Platelet Count: 263 10*3/uL (ref 150–400)
RBC: 3.05 MIL/uL — ABNORMAL LOW (ref 3.87–5.11)
RDW: 13 % (ref 11.5–15.5)
WBC Count: 6.3 10*3/uL (ref 4.0–10.5)
nRBC: 0 % (ref 0.0–0.2)

## 2022-07-26 LAB — CMP (CANCER CENTER ONLY)
ALT: 9 U/L (ref 0–44)
AST: 16 U/L (ref 15–41)
Albumin: 4.2 g/dL (ref 3.5–5.0)
Alkaline Phosphatase: 58 U/L (ref 38–126)
Anion gap: 6 (ref 5–15)
BUN: 16 mg/dL (ref 8–23)
CO2: 29 mmol/L (ref 22–32)
Calcium: 9.6 mg/dL (ref 8.9–10.3)
Chloride: 99 mmol/L (ref 98–111)
Creatinine: 0.92 mg/dL (ref 0.44–1.00)
GFR, Estimated: >60 mL/min (ref >60)
Glucose, Bld: 104 mg/dL — ABNORMAL HIGH (ref 70–99)
Potassium: 4.7 mmol/L (ref 3.5–5.1)
Sodium: 134 mmol/L — ABNORMAL LOW (ref 135–145)
Total Bilirubin: 0.3 mg/dL (ref 0.3–1.2)
Total Protein: 6.7 g/dL (ref 6.5–8.1)

## 2022-07-26 MED ORDER — OSIMERTINIB MESYLATE 80 MG PO TABS
ORAL_TABLET | Freq: Every day | ORAL | 1 refills | Status: DC
  Filled 2022-07-26: qty 30, 30d supply, fill #0
  Filled 2022-08-18: qty 30, 30d supply, fill #1
  Filled 2022-09-14: qty 30, 30d supply, fill #2
  Filled 2022-10-11: qty 30, 30d supply, fill #3
  Filled 2022-11-03: qty 30, 30d supply, fill #4
  Filled 2022-11-30: qty 30, 30d supply, fill #5

## 2022-07-26 NOTE — Progress Notes (Signed)
[facility] [phone]   [phone]  OFFICE PROGRESS NOTE  Cheryl Boston, MD [address] [place] [address]  DIAGNOSIS: Recurrent non-small cell lung cancer initially diagnosed as stage IIA (T2b, N0, M0) non-small cell lung cancer, adenocarcinoma diagnosed in November 2019  Molecular studies showed EGFR mutation in exon 21 (L858R) PD-L1 expression 0  PRIOR THERAPY:  1) status post left lower lobectomy with lymph node dissection on April 25, 2018 under the care of Dr. Dorris Fetch.  She declined adjuvant therapy 2) status post palliative radiotherapy with IMRT to the progressive pleural-based lung masses under the care of Dr. Roselind Messier completed on February 28, 2022.  CURRENT THERAPY: Osimertinib (Tagrisso) 80 mg p.o. daily.  First dose was on June 19, 2019.  Status post 37 months of treatment.  INTERVAL HISTORY: [name] 72 y.o. female return to the clinic today for follow-up visit.  The patient is feeling fine today with no concerning complaints except for pain in her left foot after she has a nondisplaced fracture of one of the metatarsal.  She continues to have mild fatigue and nail changes.  She denied having any current chest pain, shortness of breath except with exertion with no cough or hemoptysis.  She has no fever or chills.  She has no nausea, vomiting, diarrhea or constipation.  She has no headache or visual changes.  She is here today for evaluation with repeat blood work as well as CT scan of the chest for restaging of her disease.   MEDICAL HISTORY: Past Medical History:  Diagnosis Date   Anemia    Atherosclerosis    GERD (gastroesophageal reflux disease)    Hemoptysis    History of radiation therapy    Left Lung- 02/15/22-02/28/22- Dr. Antony Blackbird   Hyperlipidemia    Hypertension    IBS (irritable bowel syndrome)    Lung mass    Migraines    Spondylosis    Vitamin D deficiency     ALLERGIES:  is allergic  to avocado, influenza vaccines, lisinopril, metoprolol, nexium [esomeprazole magnesium], penicillins, pork-derived products, proton pump inhibitors, sulfites, wasp venom protein, zantac [ranitidine hcl], cefaclor, chlorhexidine, chloroxine, ciprofloxacin hcl, clarithromycin, gatifloxacin, other, sulfa antibiotics, sulfamethoxazole-trimethoprim, buckwheat, cat hair extract, dog epithelium (canis lupus familiaris), egg-derived products, fluogen [influenza virus vaccine], pollen extract, short ragweed pollen ext, sulfur, aspartame, chocolate, dilaudid [hydromorphone hcl], fentanyl, ivp dye [iodinated contrast media], ketorolac, milk-related compounds, molds & smuts, oxycodone, prednisolone, shellfish allergy, soap, and tilactase.  MEDICATIONS:  Current Outpatient Medications  Medication Sig Dispense Refill   Carboxymethylcellul-Glycerin (LUBRICATING EYE DROPS OP) Place 1 drop into both eyes daily as needed (dry eyes).     Cholecalciferol (VITAMIN D3) LIQD Take 2,000 Units by mouth daily.     Cyanocobalamin (VITAMIN B-12) 1000 MCG SUBL Place 1 tablet under the tongue once a week.     diltiazem (CARDIZEM CD) 120 MG 24 hr capsule Take 120 mg by mouth daily.     EPINEPHrine 0.3 mg/0.3 mL IJ SOAJ injection Inject 0.3 mg into the muscle as needed for anaphylaxis.     famotidine (PEPCID) 40 MG tablet Take 40 mg by mouth daily.     Multiple Vitamin (MULTIVITAMIN) tablet Take 1 tablet by mouth daily.     Omega 3-6-9 Fatty Acids (OMEGA-3 FUSION) LIQD Take by mouth.     osimertinib mesylate (TAGRISSO) 80 MG tablet TAKE 1 TABLET (80 MG TOTAL) BY MOUTH DAILY. 90 tablet 1  OVER THE COUNTER MEDICATION Iron gummies.     PRESCRIPTION MEDICATION Apply 1 application topically 2 (two) times daily as needed for itching. Dr Dallas Schimke 2019     rizatriptan (MAXALT) 10 MG tablet Take 5 mg by mouth as needed for migraine. May repeat in 2 hours if needed (Patient not taking: Reported on 02/03/2022)     No current  facility-administered medications for this visit.    SURGICAL HISTORY:  Past Surgical History:  Procedure Laterality Date   BROW LIFT AND BLEPHAROPLASTY  03/15/2013   CATARACT EXTRACTION, BILATERAL  06/23/2014   CHEST TUBE INSERTION Left 05/10/2018   Procedure: CHEST TUBE INSERTION;  Surgeon: Loreli Slot, MD;  Location: [facility] OR;  Service: Thoracic;  Laterality: Left;   COLONOSCOPY  05/2014   ESOPHAGOGASTRODUODENOSCOPY  2019   EYE SURGERY     IR THORACENTESIS ASP PLEURAL SPACE W/IMG GUIDE  05/24/2018   IR THORACENTESIS ASP PLEURAL SPACE W/IMG GUIDE  06/14/2018   TONSILLECTOMY     VIDEO ASSISTED THORACOSCOPY (VATS)/ LOBECTOMY Left 04/25/2018   Procedure: VIDEO ASSISTED THORACOSCOPY (VATS)LEFT LOWER LOBECTOMY, NODE DISSECTION;  Surgeon: Loreli Slot, MD;  Location: [facility] OR;  Service: Thoracic;  Laterality: Left;   VIDEO BRONCHOSCOPY WITH ENDOBRONCHIAL NAVIGATION N/A 03/07/2018   Procedure: VIDEO BRONCHOSCOPY WITH ENDOBRONCHIAL NAVIGATION;  Surgeon: Leslye Peer, MD;  Location: [facility] OR;  Service: Thoracic;  Laterality: N/A;   VIDEO BRONCHOSCOPY WITH INSERTION OF INTERBRONCHIAL VALVE (IBV) N/A 05/10/2018   Procedure: VIDEO BRONCHOSCOPY WITH INSERTION OF INTERBRONCHIAL VALVE (IBV);  Surgeon: Loreli Slot, MD;  Location: [facility] OR;  Service: Thoracic;  Laterality: N/A;   VIDEO BRONCHOSCOPY WITH INSERTION OF INTERBRONCHIAL VALVE (IBV) N/A 06/13/2018   Procedure: VIDEO BRONCHOSCOPY WITH REMOVAL OF INTERBRONCHIAL VALVE (IBV) x 2;  Surgeon: Loreli Slot, MD;  Location: [facility] OR;  Service: Thoracic;  Laterality: N/A;    REVIEW OF SYSTEMS:  Constitutional: positive for fatigue Eyes: negative Ears, nose, mouth, throat, and face: negative Respiratory: negative Cardiovascular: negative Gastrointestinal: positive for dyspepsia Genitourinary:negative Integument/breast: negative Hematologic/lymphatic: negative Musculoskeletal:negative Neurological: negative Behavioral/Psych:  negative Endocrine: negative Allergic/Immunologic: negative   PHYSICAL EXAMINATION: General appearance: alert, cooperative, fatigued, and no distress Head: Normocephalic, without obvious abnormality, atraumatic Neck: no adenopathy, no JVD, supple, symmetrical, trachea midline, and thyroid not enlarged, symmetric, no tenderness/mass/nodules Lymph nodes: Cervical, supraclavicular, and axillary nodes normal. Resp: clear to auscultation bilaterally Back: symmetric, no curvature. ROM normal. No CVA tenderness. Cardio: regular rate and rhythm, S1, S2 normal, no murmur, click, rub or gallop GI: soft, non-tender; bowel sounds normal; no masses,  no organomegaly Extremities: extremities normal, atraumatic, no cyanosis or edema Neurologic: Alert and oriented X 3, normal strength and tone. Normal symmetric reflexes. Normal coordination and gait   ECOG PERFORMANCE STATUS: 1 - Symptomatic but completely ambulatory  Blood pressure (!) 156/66, pulse 81, temperature 97.7 F (36.5 C), temperature source Temporal, resp. rate 15, height 5' 4.75" (1.645 m), weight 136 lb 12.8 oz (62.1 kg), SpO2 97 %.   LABORATORY DATA: Lab Results  Component Value Date   WBC 6.3 07/26/2022   HGB 9.9 (L) 07/26/2022   HCT 29.4 (L) 07/26/2022   MCV 96.4 07/26/2022   PLT 263 07/26/2022      Chemistry      Component Value Date/Time   NA 134 (L) 07/26/2022 1028   K 4.7 07/26/2022 1028   CL 99 07/26/2022 1028   CO2 29 07/26/2022 1028   BUN 16 07/26/2022 1028   CREATININE 0.92 07/26/2022 1028  Component Value Date/Time   CALCIUM 9.6 07/26/2022 1028   ALKPHOS 58 07/26/2022 1028   AST 16 07/26/2022 1028   ALT 9 07/26/2022 1028   BILITOT 0.3 07/26/2022 1028       RADIOGRAPHIC STUDIES: CT Chest Wo Contrast  Result Date: 07/26/2022 CLINICAL DATA:  Non-small-cell lung cancer. Restaging. * Tracking Code: BO * EXAM: CT CHEST WITHOUT CONTRAST TECHNIQUE: Multidetector CT imaging of the chest was performed following  the standard protocol without IV contrast. RADIATION DOSE REDUCTION: This exam was performed according to the departmental dose-optimization program which includes automated exposure control, adjustment of the mA and/or kV according to patient size and/or use of iterative reconstruction technique. COMPARISON:  04/25/2022. FINDINGS: Cardiovascular: The heart size is normal. No substantial pericardial effusion. Coronary artery calcification is evident. Mild atherosclerotic calcification is noted in the wall of the thoracic aorta. Mediastinum/Nodes: No mediastinal lymphadenopathy. No evidence for gross hilar lymphadenopathy although assessment is limited by the lack of intravenous contrast on the current study. The esophagus has normal imaging features. There is no axillary lymphadenopathy. Lungs/Pleura: No suspicious pulmonary nodule or mass in the right lung. Volume loss again noted left hemithorax consistent with prior left lower lobectomy. The radiation change seen previously in the infrahilar left lung shows interval evolution, becoming more confluent and demonstrating volume loss in the interval since prior study. Radiation scarring has now contracted medially into the paraspinal region. Bandlike irregular opacity extending anterolaterally in the left infrahilar region is compatible with evolving post radiation change. The most cranial of the left paraspinal pleural nodules which was still visible on the previous study has essentially resolved in the interval with no residual nodularity at this location on the current study. The second 6 mm pleural nodule seen on the previous study may be barely perceptible today on image 60/series 2. There has been some progression of a small loculated fluid collection in the paraspinal in posterior left hemithorax with a similar trace sub pulmonic component adjacent to the posterior dome of the hemidiaphragm (image 104/2). A 6 mm pleural based nodule in the lateral aspect of  the upper left hemithorax (image 42/series 2) is new in the interval since prior study. Upper Abdomen: Tiny nodules adjacent to the distal esophagus/esophagogastric junction are stable in the interval. No evidence for adrenal nodule or mass. Musculoskeletal: No worrisome lytic or sclerotic osseous abnormality. IMPRESSION: 1. Interval development of a new 6 mm pleural nodule in the lateral left upper hemithorax. Close attention on follow-up recommended as metastatic disease cannot be excluded. 2. Interval evolution of the radiation change seen previously in the infrahilar left lung. The patient had multiple paraspinal posteromedial pleural based nodules on previous imaging studies. Two of these were visible on the most recent comparison and those have essentially resolved in the interval with no residual nodularity detectable on the current study. 3. Slight increase in a small loculated pleural fluid collection in the paraspinal and posterior left hemithorax with a similar trace sub pulmonic component adjacent to the posterior dome of the hemidiaphragm. 4.  Aortic Atherosclerosis (ICD10-I70.0). Electronically Signed   By: Kennith CenterEric  Mansell M.D.   On: 07/26/2022 11:56    ASSESSMENT AND PLAN: This is a very pleasant 72 years old white female with a stage IIa non-small cell lung cancer, adenocarcinoma with positive EGFR mutation in exon 21 (L858R) status post left lower lobectomy with lymph node dissection under the care of Dr. Dorris FetchHendrickson. She declined to proceed with adjuvant systemic chemotherapy or targeted therapy. The patient was  on observation since her surgical resection until she had disease recurrence in February 2021 presented with pleural-based masses in the left lung with new biopsy and molecular studies positive for EGFR mutation exon 21 (L858R). The patient started treatment with Tagrisso 80 mg p.o. daily status post almost 37 months of treatment.  She has been tolerating her treatment with Tagrisso  fairly well with no concerning adverse effects except for mild rash and diarrhea. Imaging studies including CT scan of the chest followed by PET scan in August 2023 showed disease progression and the pleural-based masses in the left lung. The patient underwent CT-guided core biopsy of one of the pleural-based left lung progressive lesion and the final pathology was consistent with metastatic adenocarcinoma. The molecular studies by [facility] 1 and it was positive for EGFR L718V, L858R.  She has negative PD-L1 expression. The patient was referred to Dr. Roselind Messier and she underwent IMRT to the progressive lesion of the left lung pleural-based masses.   The patient continues her treatment with Tagrisso with the same dose and she is tolerating it fairly well except for some nail changes, dyspepsia and fatigue. She had repeat CT scan of the chest without contrast performed earlier today.  The scan showed interval development of a new 6 mm pleural-based nodule in the lateral left upper lobe hemithorax in addition to the interval evolution of the radiation changes and improvement of her previous disease. I personally and independently reviewed the scan images and discussed the result and showed to the patient and her husband. I recommended for her to continue her current treatment with Tagrisso for now but I will repeat CT scan of the chest in around 3 months for further evaluation of this new nodule and to rule out any other disease recurrence. If she develop any further disease progression that is not amenable for SBRT, I may consider changing her treatment to a Afatinib which works on uncommon mutation like L718V in addition to L858R. The patient has a second opinion at [facility] with Dr. Eli Phillips who agreed with the current plan. For the anemia, she will continue on the oral iron tablet. For the dyspepsia she will continue her current treatment with Pepcid. The patient was advised to call immediately if  she has any other concerning symptoms in the interval. The patient voices understanding of current disease status and treatment options and is in agreement with the current care plan. All questions were answered. The patient knows to call the clinic with any problems, questions or concerns. We can certainly see the patient much sooner if necessary. The total time spent in the appointment was 35 minutes.  Disclaimer: This note was dictated with voice recognition software. Similar sounding words can inadvertently be transcribed and may not be corrected upon review.

## 2022-07-27 ENCOUNTER — Other Ambulatory Visit (HOSPITAL_COMMUNITY): Payer: Self-pay

## 2022-08-18 ENCOUNTER — Other Ambulatory Visit (HOSPITAL_COMMUNITY): Payer: Self-pay

## 2022-08-19 ENCOUNTER — Other Ambulatory Visit (HOSPITAL_COMMUNITY): Payer: Self-pay

## 2022-09-14 ENCOUNTER — Other Ambulatory Visit (HOSPITAL_COMMUNITY): Payer: Self-pay

## 2022-09-14 ENCOUNTER — Other Ambulatory Visit: Payer: Self-pay

## 2022-09-15 ENCOUNTER — Other Ambulatory Visit (HOSPITAL_COMMUNITY): Payer: Self-pay

## 2022-09-27 ENCOUNTER — Other Ambulatory Visit (HOSPITAL_COMMUNITY): Payer: Self-pay

## 2022-10-05 ENCOUNTER — Encounter: Payer: Self-pay | Admitting: General Practice

## 2022-10-05 NOTE — Progress Notes (Signed)
[facility] Spiritual Care Note  Followed up with [name] by phone because she missed virtual Lung Cancer Support Group yesterday due to tech frustrations. 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 She is looking forward to time at the beach with her children (from [place] and [place]) and four grandchildren this summer and is generally keeping busy with meaning-making activities. [name] also expressed interest in serving as a supportive resource/mentor for other patients who may be encountering similar diagnoses and challenges.  Provided reflective listening, normalization of feelings, affirmation of strengths, and emotional support. We plan to follow up through Lung Group and as needed for additional conversation.   [address] [place], [place], [facility] Pager [phone] Voicemail [phone]

## 2022-10-11 ENCOUNTER — Other Ambulatory Visit (HOSPITAL_COMMUNITY): Payer: Self-pay

## 2022-11-01 ENCOUNTER — Inpatient Hospital Stay (HOSPITAL_BASED_OUTPATIENT_CLINIC_OR_DEPARTMENT_OTHER): Payer: Medicare Other | Admitting: Internal Medicine

## 2022-11-01 ENCOUNTER — Inpatient Hospital Stay: Payer: Medicare Other | Attending: Internal Medicine

## 2022-11-01 ENCOUNTER — Other Ambulatory Visit: Payer: Self-pay

## 2022-11-01 ENCOUNTER — Ambulatory Visit (HOSPITAL_COMMUNITY)
Admission: RE | Admit: 2022-11-01 | Discharge: 2022-11-01 | Disposition: A | Discharge status: Home / Self Care | Payer: Medicare Other | Source: Ambulatory Visit | Attending: Internal Medicine | Admitting: Internal Medicine

## 2022-11-01 VITALS — BP 160/69 | HR 85 | Temp 98.0°F | Resp 16 | Ht 64.75 in | Wt 140.3 lb

## 2022-11-01 DIAGNOSIS — Z79899 Other long term (current) drug therapy: Secondary | ICD-10-CM | POA: Diagnosis not present

## 2022-11-01 DIAGNOSIS — C3490 Malignant neoplasm of unspecified part of unspecified bronchus or lung: Secondary | ICD-10-CM | POA: Insufficient documentation

## 2022-11-01 DIAGNOSIS — Z888 Allergy status to other drugs, medicaments and biological substances status: Secondary | ICD-10-CM | POA: Insufficient documentation

## 2022-11-01 DIAGNOSIS — Z882 Allergy status to sulfonamides status: Secondary | ICD-10-CM | POA: Diagnosis not present

## 2022-11-01 DIAGNOSIS — Z923 Personal history of irradiation: Secondary | ICD-10-CM | POA: Insufficient documentation

## 2022-11-01 DIAGNOSIS — C3432 Malignant neoplasm of lower lobe, left bronchus or lung: Secondary | ICD-10-CM | POA: Insufficient documentation

## 2022-11-01 DIAGNOSIS — Z885 Allergy status to narcotic agent status: Secondary | ICD-10-CM | POA: Insufficient documentation

## 2022-11-01 DIAGNOSIS — Z902 Acquired absence of lung [part of]: Secondary | ICD-10-CM | POA: Insufficient documentation

## 2022-11-01 DIAGNOSIS — D649 Anemia, unspecified: Secondary | ICD-10-CM | POA: Insufficient documentation

## 2022-11-01 DIAGNOSIS — Z880 Allergy status to penicillin: Secondary | ICD-10-CM | POA: Insufficient documentation

## 2022-11-01 DIAGNOSIS — Z887 Allergy status to serum and vaccine status: Secondary | ICD-10-CM | POA: Insufficient documentation

## 2022-11-01 DIAGNOSIS — C7951 Secondary malignant neoplasm of bone: Secondary | ICD-10-CM | POA: Diagnosis not present

## 2022-11-01 DIAGNOSIS — R1013 Epigastric pain: Secondary | ICD-10-CM | POA: Diagnosis not present

## 2022-11-01 DIAGNOSIS — R5383 Other fatigue: Secondary | ICD-10-CM | POA: Insufficient documentation

## 2022-11-01 DIAGNOSIS — Z881 Allergy status to other antibiotic agents status: Secondary | ICD-10-CM | POA: Diagnosis not present

## 2022-11-01 LAB — CMP (CANCER CENTER ONLY)
ALT: 11 U/L (ref 0–44)
AST: 18 U/L (ref 15–41)
Albumin: 4.2 g/dL (ref 3.5–5.0)
Alkaline Phosphatase: 64 U/L (ref 38–126)
Anion gap: 7 (ref 5–15)
BUN: 20 mg/dL (ref 8–23)
CO2: 28 mmol/L (ref 22–32)
Calcium: 9.4 mg/dL (ref 8.9–10.3)
Chloride: 100 mmol/L (ref 98–111)
Creatinine: 0.95 mg/dL (ref 0.44–1.00)
GFR, Estimated: >60 mL/min (ref >60)
Glucose, Bld: 97 mg/dL (ref 70–99)
Potassium: 4 mmol/L (ref 3.5–5.1)
Sodium: 135 mmol/L (ref 135–145)
Total Bilirubin: 0.3 mg/dL (ref 0.3–1.2)
Total Protein: 6.6 g/dL (ref 6.5–8.1)

## 2022-11-01 LAB — CBC WITH DIFFERENTIAL (CANCER CENTER ONLY)
Abs Immature Granulocytes: 0.02 10*3/uL (ref 0.00–0.07)
Basophils Absolute: 0 10*3/uL (ref 0.0–0.1)
Basophils Relative: 0 %
Eosinophils Absolute: 0.1 10*3/uL (ref 0.0–0.5)
Eosinophils Relative: 1 %
HCT: 29.2 % — ABNORMAL LOW (ref 36.0–46.0)
Hemoglobin: 9.6 g/dL — ABNORMAL LOW (ref 12.0–15.0)
Immature Granulocytes: 0 %
Lymphocytes Relative: 23 %
Lymphs Abs: 1.4 10*3/uL (ref 0.7–4.0)
MCH: 31.9 pg (ref 26.0–34.0)
MCHC: 32.9 g/dL (ref 30.0–36.0)
MCV: 97 fL (ref 80.0–100.0)
Monocytes Absolute: 0.6 10*3/uL (ref 0.1–1.0)
Monocytes Relative: 10 %
Neutro Abs: 4 10*3/uL (ref 1.7–7.7)
Neutrophils Relative %: 66 %
Platelet Count: 230 10*3/uL (ref 150–400)
RBC: 3.01 MIL/uL — ABNORMAL LOW (ref 3.87–5.11)
RDW: 12.9 % (ref 11.5–15.5)
WBC Count: 6.1 10*3/uL (ref 4.0–10.5)
nRBC: 0 % (ref 0.0–0.2)

## 2022-11-01 MED ORDER — LORAZEPAM 0.5 MG PO TABS: ORAL_TABLET | ORAL | 0 refills | Status: DC

## 2022-11-01 NOTE — Progress Notes (Signed)
[facility] [phone]   [phone]  OFFICE PROGRESS NOTE  [name], MD [address] [place] [address]  DIAGNOSIS: Recurrent non-small cell lung cancer initially diagnosed as stage IIA (T2b, N0, M0) non-small cell lung cancer, adenocarcinoma diagnosed in November 2019  Molecular studies showed EGFR mutation in exon 21 (L858R) PD-L1 expression 0  PRIOR THERAPY:  1) status post left lower lobectomy with lymph node dissection on April 25, 2018 under the care of Dr. Dorris Fetch.  She declined adjuvant therapy 2) status post palliative radiotherapy with IMRT to the progressive pleural-based lung masses under the care of Dr. Roselind Messier completed on February 28, 2022.  CURRENT THERAPY: Osimertinib (Tagrisso) 80 mg p.o. daily.  First dose was on June 19, 2019.  Status post 41 months of treatment.  INTERVAL HISTORY: [name] 72 y.o. female returns to the clinic today for follow-up visit accompanied by her husband.  The patient is feeling fine except for the fatigue.  She denied having any current chest pain, shortness of breath, cough or hemoptysis.  She has no nausea, vomiting, diarrhea or constipation.  She continues to have mild dry skin and occasional skin rash.  She has no recent weight loss or night sweats.  She has no headache or visual changes but occasional dizzy spells.  She has been tolerating her treatment with Tagrisso fairly well.  She is here today for evaluation with repeat CT scan of the chest for restaging of her disease.  MEDICAL HISTORY: Past Medical History:  Diagnosis Date   Anemia    Atherosclerosis    GERD (gastroesophageal reflux disease)    Hemoptysis    History of radiation therapy    Left Lung- 02/15/22-02/28/22- Dr. Antony Blackbird   Hyperlipidemia    Hypertension    IBS (irritable bowel syndrome)    Lung mass    Migraines    Spondylosis    Vitamin D deficiency     ALLERGIES:  is allergic to avocado,  influenza vaccines, lisinopril, metoprolol, nexium [esomeprazole magnesium], penicillins, pork-derived products, proton pump inhibitors, sulfites, wasp venom protein, zantac [ranitidine hcl], cefaclor, chlorhexidine, chloroxine, ciprofloxacin hcl, clarithromycin, gatifloxacin, other, sulfa antibiotics, sulfamethoxazole-trimethoprim, buckwheat, cat hair extract, dog epithelium (canis lupus familiaris), egg-derived products, fluogen [influenza virus vaccine], pollen extract, short ragweed pollen ext, sulfur, aspartame, chocolate, dilaudid [hydromorphone hcl], fentanyl, ivp dye [iodinated contrast media], ketorolac, milk-related compounds, molds & smuts, oxycodone, prednisolone, shellfish allergy, soap, and tilactase.  MEDICATIONS:  Current Outpatient Medications  Medication Sig Dispense Refill   Carboxymethylcellul-Glycerin (LUBRICATING EYE DROPS OP) Place 1 drop into both eyes daily as needed (dry eyes).     Cholecalciferol (VITAMIN D3) LIQD Take 2,000 Units by mouth daily.     Cyanocobalamin (VITAMIN B-12) 1000 MCG SUBL Place 1 tablet under the tongue once a week.     diltiazem (CARDIZEM CD) 120 MG 24 hr capsule Take 120 mg by mouth daily.     EPINEPHrine 0.3 mg/0.3 mL IJ SOAJ injection Inject 0.3 mg into the muscle as needed for anaphylaxis.     famotidine (PEPCID) 40 MG tablet Take 40 mg by mouth daily.     Multiple Vitamin (MULTIVITAMIN) tablet Take 1 tablet by mouth daily.     Omega 3-6-9 Fatty Acids (OMEGA-3 FUSION) LIQD Take by mouth.     osimertinib mesylate (TAGRISSO) 80 MG tablet TAKE 1 TABLET (80 MG TOTAL) BY MOUTH DAILY. 90 tablet 1   OVER THE COUNTER MEDICATION Iron gummies.  PRESCRIPTION MEDICATION Apply 1 application topically 2 (two) times daily as needed for itching. Dr Dallas Schimke 2019     rizatriptan (MAXALT) 10 MG tablet Take 5 mg by mouth as needed for migraine. May repeat in 2 hours if needed (Patient not taking: Reported on 02/03/2022)     No current facility-administered  medications for this visit.    SURGICAL HISTORY:  Past Surgical History:  Procedure Laterality Date   BROW LIFT AND BLEPHAROPLASTY  03/15/2013   CATARACT EXTRACTION, BILATERAL  06/23/2014   CHEST TUBE INSERTION Left 05/10/2018   Procedure: CHEST TUBE INSERTION;  Surgeon: Loreli Slot, MD;  Location: [facility] OR;  Service: Thoracic;  Laterality: Left;   COLONOSCOPY  05/2014   ESOPHAGOGASTRODUODENOSCOPY  2019   EYE SURGERY     IR THORACENTESIS ASP PLEURAL SPACE W/IMG GUIDE  05/24/2018   IR THORACENTESIS ASP PLEURAL SPACE W/IMG GUIDE  06/14/2018   TONSILLECTOMY     VIDEO ASSISTED THORACOSCOPY (VATS)/ LOBECTOMY Left 04/25/2018   Procedure: VIDEO ASSISTED THORACOSCOPY (VATS)LEFT LOWER LOBECTOMY, NODE DISSECTION;  Surgeon: Loreli Slot, MD;  Location: [facility] OR;  Service: Thoracic;  Laterality: Left;   VIDEO BRONCHOSCOPY WITH ENDOBRONCHIAL NAVIGATION N/A 03/07/2018   Procedure: VIDEO BRONCHOSCOPY WITH ENDOBRONCHIAL NAVIGATION;  Surgeon: Leslye Peer, MD;  Location: [facility] OR;  Service: Thoracic;  Laterality: N/A;   VIDEO BRONCHOSCOPY WITH INSERTION OF INTERBRONCHIAL VALVE (IBV) N/A 05/10/2018   Procedure: VIDEO BRONCHOSCOPY WITH INSERTION OF INTERBRONCHIAL VALVE (IBV);  Surgeon: Loreli Slot, MD;  Location: [facility] OR;  Service: Thoracic;  Laterality: N/A;   VIDEO BRONCHOSCOPY WITH INSERTION OF INTERBRONCHIAL VALVE (IBV) N/A 06/13/2018   Procedure: VIDEO BRONCHOSCOPY WITH REMOVAL OF INTERBRONCHIAL VALVE (IBV) x 2;  Surgeon: Loreli Slot, MD;  Location: [facility] OR;  Service: Thoracic;  Laterality: N/A;    REVIEW OF SYSTEMS:  Constitutional: positive for fatigue Eyes: negative Ears, nose, mouth, throat, and face: negative Respiratory: negative Cardiovascular: negative Gastrointestinal: positive for dyspepsia Genitourinary:negative Integument/breast: negative Hematologic/lymphatic: negative Musculoskeletal:negative Neurological: negative Behavioral/Psych: negative Endocrine:  negative Allergic/Immunologic: negative   PHYSICAL EXAMINATION: General appearance: alert, cooperative, fatigued, and no distress Head: Normocephalic, without obvious abnormality, atraumatic Neck: no adenopathy, no JVD, supple, symmetrical, trachea midline, and thyroid not enlarged, symmetric, no tenderness/mass/nodules Lymph nodes: Cervical, supraclavicular, and axillary nodes normal. Resp: clear to auscultation bilaterally Back: symmetric, no curvature. ROM normal. No CVA tenderness. Cardio: regular rate and rhythm, S1, S2 normal, no murmur, click, rub or gallop GI: soft, non-tender; bowel sounds normal; no masses,  no organomegaly Extremities: extremities normal, atraumatic, no cyanosis or edema Neurologic: Alert and oriented X 3, normal strength and tone. Normal symmetric reflexes. Normal coordination and gait   ECOG PERFORMANCE STATUS: 1 - Symptomatic but completely ambulatory  Blood pressure (!) 160/69, pulse 85, temperature 98 F (36.7 C), temperature source Oral, resp. rate 16, height 5' 4.75" (1.645 m), weight 140 lb 4.8 oz (63.6 kg), SpO2 100%.   LABORATORY DATA: Lab Results  Component Value Date   WBC 6.1 11/01/2022   HGB 9.6 (L) 11/01/2022   HCT 29.2 (L) 11/01/2022   MCV 97.0 11/01/2022   PLT 230 11/01/2022      Chemistry      Component Value Date/Time   NA 135 11/01/2022 1110   K 4.0 11/01/2022 1110   CL 100 11/01/2022 1110   CO2 28 11/01/2022 1110   BUN 20 11/01/2022 1110   CREATININE 0.95 11/01/2022 1110      Component Value Date/Time   CALCIUM 9.4  11/01/2022 1110   ALKPHOS 64 11/01/2022 1110   AST 18 11/01/2022 1110   ALT 11 11/01/2022 1110   BILITOT 0.3 11/01/2022 1110       RADIOGRAPHIC STUDIES: CT Chest Wo Contrast  Result Date: 11/01/2022 CLINICAL DATA:  Non-small-cell lung cancer. Restaging. * Tracking Code: BO * EXAM: CT CHEST WITHOUT CONTRAST TECHNIQUE: Multidetector CT imaging of the chest was performed following the standard protocol  without IV contrast. RADIATION DOSE REDUCTION: This exam was performed according to the departmental dose-optimization program which includes automated exposure control, adjustment of the mA and/or kV according to patient size and/or use of iterative reconstruction technique. COMPARISON:  07/26/2022.  04/25/2022. FINDINGS: Cardiovascular: The heart size is normal. No substantial pericardial effusion. Coronary artery calcification is evident. Mild atherosclerotic calcification is noted in the wall of the thoracic aorta. Mediastinum/Nodes: No mediastinal lymphadenopathy. No evidence for gross hilar lymphadenopathy although assessment is limited by the lack of intravenous contrast on the current study. The esophagus has normal imaging features. There is no axillary lymphadenopathy. Lungs/Pleura: No suspicious pulmonary nodule or mass in the right lung. Volume loss in the left hemithorax is compatible with previous left lower lobectomy and radiation therapy. The post treatment scarring in the parahilar and infrahilar left lung is similar. Small left pleural effusion with probable sub pulmonic loculated component along the dome of the hemidiaphragm is stable to minimally progressive in the interval. No new suspicious left pulmonary nodule or mass. The pleural based lesion of concern seen previously in the lateral aspect of the upper left hemithorax just deep to the lateral left fourth rib is stable to minimally increased measuring 7 mm today compared to 6 mm previously. More posteriorly and superiorly in the left chest is a new pleural based nodule measuring 7 mm on image 32/2, also deep to the left fourth rib. A new 10 mm pleural based nodule is seen towards the left apex, deep to the third rib on image 25/2. Other scattered tiny pleural nodules are seen elsewhere in the left hemithorax. There is a new 9 mm anterior subpleural nodule on the left (image 60/2). Several additional tiny soft tissue nodules are seen in the  same region. 9 mm nodule anterior left base on image 110/2 is new since prior. 8 mm nodule identified in the anterior mediastinum, anterior to the pulmonary outflow on image 85/2, is also new since prior. Mild pleural thickening seen along the paraspinal left pleura, consistent with post treatment change but no discrete residual nodularity in this region today. Upper Abdomen: Visualized portion of the upper abdomen is unremarkable. Musculoskeletal: No worrisome lytic or sclerotic osseous abnormality. IMPRESSION: 1. Mild interval progression of previous pleural based nodularity in the left hemithorax with new subpleural nodules in the left hemithorax on today's exam. Imaging features are concerning for progression. 2. 8 mm nodule anterior to the pulmonary outflow tract. This is new in the interval and concerning for metastatic disease potentially in a pleural reflection or in the anterior mediastinum. 3. No substantial change in small left pleural effusion with probable sub pulmonic loculated component along the dome of the hemidiaphragm. 4. Stable post treatment changes in the left hemithorax. 5.  Aortic Atherosclerosis (ICD10-I70.0). Electronically Signed   By: Kennith Center M.D.   On: 11/01/2022 12:25    ASSESSMENT AND PLAN: This is a very pleasant 72 years old white female with a stage IIa non-small cell lung cancer, adenocarcinoma with positive EGFR mutation in exon 21 (L858R) status post left lower lobectomy  with lymph node dissection under the care of Dr. Dorris Fetch. She declined to proceed with adjuvant systemic chemotherapy or targeted therapy. The patient was on observation since her surgical resection until she had disease recurrence in February 2021 presented with pleural-based masses in the left lung with new biopsy and molecular studies positive for EGFR mutation exon 21 (L858R). The patient started treatment with Tagrisso 80 mg p.o. daily status post almost 41 months of treatment.  She has been  tolerating her treatment with Tagrisso fairly well with no concerning adverse effects except for mild rash and diarrhea. Imaging studies including CT scan of the chest followed by PET scan in August 2023 showed disease progression and the pleural-based masses in the left lung. The patient underwent CT-guided core biopsy of one of the pleural-based left lung progressive lesion and the final pathology was consistent with metastatic adenocarcinoma. The molecular studies by foundation 1 and it was positive for EGFR L718V, L858R.  She has negative PD-L1 expression. The patient was referred to Dr. Roselind Messier and she underwent IMRT to the progressive lesion of the left lung pleural-based masses.   She continued her treatment with Tagrisso 80 mg p.o. daily and she has been tolerating the treatment well except for the fatigue. She had repeat CT scan of the chest without contrast performed earlier today.  I personally and independently reviewed the scan images and discussed the result and showed the images to the patient today. Unfortunately her scan showed mild interval progression of previous pleural-based nodularities in the left hemisphere with new subpleural nodules in the left hemithorax concerning for disease progression. I had a lengthy discussion with the patient and her husband about her current condition and treatment options. I recommended for the patient to have complete staging workup by ordering a PET scan as well as MRI of the brain to rule out any other metastatic disease. I discussed with the patient several options for treatment of her condition including treatment with afatinib because of the development of the EGFR L718V mutation in addition to her baseline L858R mutation on the previous biopsy.  Afatinib has some activity in this combination of EGFR mutation. The second option discussed with the patient was to continue treatment with osimertinib but adding systemic chemotherapy with carboplatin,  pemetrexed plus minus Avastin. The third option would be a consideration of a combination of Amivantamab and lazertinib on a clinical trial or compassionate use at one of the tertiary center that may have access to this combination. The patient would prefer treatment with afatinib but we will wait for the full staging workup before switching her treatment. In the meantime she will reach out to Dr. Eli Phillips to see if she has any other option for her condition. For the anemia she will continue with the oral iron tablet for now. For the dyspepsia she is currently on Pepcid. I will see the patient back for follow-up visit in around 3 weeks for evaluation and more discussion of her treatment options based on the staging workup.  She will have some activity out of town the next 2 weeks. The patient was advised to call immediately if she has any other concerning symptoms in the interval. The patient voices understanding of current disease status and treatment options and is in agreement with the current care plan. All questions were answered. The patient knows to call the clinic with any problems, questions or concerns. We can certainly see the patient much sooner if necessary. The total time spent in the  appointment was 40 minutes.  Disclaimer: This note was dictated with voice recognition software. Similar sounding words can inadvertently be transcribed and may not be corrected upon review.

## 2022-11-02 ENCOUNTER — Telehealth: Payer: Self-pay | Admitting: Internal Medicine

## 2022-11-02 NOTE — Telephone Encounter (Signed)
Scheduled per 07/16 los, patient has been called and notified.  

## 2022-11-03 ENCOUNTER — Other Ambulatory Visit (HOSPITAL_COMMUNITY): Payer: Self-pay

## 2022-11-04 ENCOUNTER — Encounter: Payer: Self-pay | Admitting: Internal Medicine

## 2022-11-07 ENCOUNTER — Other Ambulatory Visit (HOSPITAL_COMMUNITY): Payer: Self-pay

## 2022-11-07 ENCOUNTER — Other Ambulatory Visit: Payer: Self-pay | Admitting: Physician Assistant

## 2022-11-07 ENCOUNTER — Telehealth: Payer: Self-pay | Admitting: Internal Medicine

## 2022-11-10 ENCOUNTER — Encounter: Payer: Self-pay | Admitting: Internal Medicine

## 2022-11-24 ENCOUNTER — Ambulatory Visit: Payer: Medicare Other | Admitting: Internal Medicine

## 2022-11-28 ENCOUNTER — Encounter (HOSPITAL_COMMUNITY)
Admission: RE | Admit: 2022-11-28 | Discharge: 2022-11-28 | Disposition: A | Discharge status: Home / Self Care | Payer: Medicare Other | Source: Ambulatory Visit | Attending: Internal Medicine | Admitting: Internal Medicine

## 2022-11-28 ENCOUNTER — Inpatient Hospital Stay (HOSPITAL_BASED_OUTPATIENT_CLINIC_OR_DEPARTMENT_OTHER): Payer: Medicare Other | Admitting: Internal Medicine

## 2022-11-28 ENCOUNTER — Ambulatory Visit (HOSPITAL_COMMUNITY)
Admission: RE | Admit: 2022-11-28 | Discharge: 2022-11-28 | Disposition: A | Discharge status: Home / Self Care | Payer: Medicare Other | Source: Ambulatory Visit | Attending: Internal Medicine | Admitting: Internal Medicine

## 2022-11-28 ENCOUNTER — Inpatient Hospital Stay: Payer: Medicare Other | Attending: Internal Medicine

## 2022-11-28 ENCOUNTER — Other Ambulatory Visit: Payer: Self-pay

## 2022-11-28 VITALS — BP 156/72 | HR 87 | Temp 97.5°F | Resp 16 | Ht 64.75 in | Wt 139.4 lb

## 2022-11-28 DIAGNOSIS — C3490 Malignant neoplasm of unspecified part of unspecified bronchus or lung: Secondary | ICD-10-CM

## 2022-11-28 DIAGNOSIS — C3432 Malignant neoplasm of lower lobe, left bronchus or lung: Secondary | ICD-10-CM | POA: Diagnosis present

## 2022-11-28 DIAGNOSIS — Z902 Acquired absence of lung [part of]: Secondary | ICD-10-CM | POA: Diagnosis not present

## 2022-11-28 DIAGNOSIS — Z79899 Other long term (current) drug therapy: Secondary | ICD-10-CM | POA: Insufficient documentation

## 2022-11-28 DIAGNOSIS — D649 Anemia, unspecified: Secondary | ICD-10-CM | POA: Insufficient documentation

## 2022-11-28 DIAGNOSIS — J90 Pleural effusion, not elsewhere classified: Secondary | ICD-10-CM | POA: Diagnosis not present

## 2022-11-28 DIAGNOSIS — I700 Atherosclerosis of aorta: Secondary | ICD-10-CM | POA: Diagnosis not present

## 2022-11-28 DIAGNOSIS — R918 Other nonspecific abnormal finding of lung field: Secondary | ICD-10-CM | POA: Insufficient documentation

## 2022-11-28 DIAGNOSIS — Z923 Personal history of irradiation: Secondary | ICD-10-CM | POA: Diagnosis not present

## 2022-11-28 LAB — CMP (CANCER CENTER ONLY)
ALT: 12 U/L (ref 0–44)
AST: 20 U/L (ref 15–41)
Albumin: 4.3 g/dL (ref 3.5–5.0)
Alkaline Phosphatase: 65 U/L (ref 38–126)
Anion gap: 8 (ref 5–15)
BUN: 15 mg/dL (ref 8–23)
CO2: 28 mmol/L (ref 22–32)
Calcium: 9 mg/dL (ref 8.9–10.3)
Chloride: 100 mmol/L (ref 98–111)
Creatinine: 0.92 mg/dL (ref 0.44–1.00)
GFR, Estimated: >60 mL/min (ref >60)
Glucose, Bld: 137 mg/dL — ABNORMAL HIGH (ref 70–99)
Potassium: 4 mmol/L (ref 3.5–5.1)
Sodium: 136 mmol/L (ref 135–145)
Total Bilirubin: 0.4 mg/dL (ref 0.3–1.2)
Total Protein: 6.7 g/dL (ref 6.5–8.1)

## 2022-11-28 LAB — CBC WITH DIFFERENTIAL (CANCER CENTER ONLY)
Abs Immature Granulocytes: 0.01 10*3/uL (ref 0.00–0.07)
Basophils Absolute: 0 10*3/uL (ref 0.0–0.1)
Basophils Relative: 0 %
Eosinophils Absolute: 0 10*3/uL (ref 0.0–0.5)
Eosinophils Relative: 1 %
HCT: 30.1 % — ABNORMAL LOW (ref 36.0–46.0)
Hemoglobin: 9.8 g/dL — ABNORMAL LOW (ref 12.0–15.0)
Immature Granulocytes: 0 %
Lymphocytes Relative: 21 %
Lymphs Abs: 1.2 10*3/uL (ref 0.7–4.0)
MCH: 31.7 pg (ref 26.0–34.0)
MCHC: 32.6 g/dL (ref 30.0–36.0)
MCV: 97.4 fL (ref 80.0–100.0)
Monocytes Absolute: 0.4 10*3/uL (ref 0.1–1.0)
Monocytes Relative: 6 %
Neutro Abs: 4.3 10*3/uL (ref 1.7–7.7)
Neutrophils Relative %: 72 %
Platelet Count: 219 10*3/uL (ref 150–400)
RBC: 3.09 MIL/uL — ABNORMAL LOW (ref 3.87–5.11)
RDW: 12.9 % (ref 11.5–15.5)
WBC Count: 5.9 10*3/uL (ref 4.0–10.5)
nRBC: 0 % (ref 0.0–0.2)

## 2022-11-28 LAB — GLUCOSE, CAPILLARY: Glucose-Capillary: 110 mg/dL — ABNORMAL HIGH (ref 70–99)

## 2022-11-28 MED ORDER — FLUDEOXYGLUCOSE F - 18 (FDG) INJECTION
6.6400 | Freq: Once | INTRAVENOUS | Status: AC
  Administered 2022-11-28: 6.64 via INTRAVENOUS

## 2022-11-28 MED ORDER — GADOBUTROL 1 MMOL/ML IV SOLN
6.0000 mL | Freq: Once | INTRAVENOUS | Status: AC | PRN
  Administered 2022-11-28: 6 mL via INTRAVENOUS

## 2022-11-28 NOTE — Progress Notes (Signed)
[facility] [phone]   [phone]  OFFICE PROGRESS NOTE  [name], MD [address] [place] [address]  DIAGNOSIS: Recurrent non-small cell lung cancer initially diagnosed as stage IIA (T2b, N0, M0) non-small cell lung cancer, adenocarcinoma diagnosed in November 2019  Molecular studies showed EGFR mutation in exon 21 (L858R) PD-L1 expression 0  PRIOR THERAPY:  1) status post left lower lobectomy with lymph node dissection on April 25, 2018 under the care of Dr. Dorris Fetch.  She declined adjuvant therapy 2) status post palliative radiotherapy with IMRT to the progressive pleural-based lung masses under the care of Dr. Roselind Messier completed on February 28, 2022.  CURRENT THERAPY: Osimertinib (Tagrisso) 80 mg p.o. daily.  First dose was on June 19, 2019.  Status post 42 months of treatment.  INTERVAL HISTORY: [name] 72 y.o. female returns to the clinic today for follow-up visit accompanied by her husband.  The patient is feeling very well today with no concerning complaints except for the nail changes and occasional dyspepsia.  She is drinking more of electrolyte drinks these days and she is feeling a little bit better.  She denied having any current chest pain, shortness of breath but has mild cough with no hemoptysis.  She denied having any nausea, vomiting, diarrhea or constipation.  She has no headache or visual changes.  She has no fever or chills.  She denied having any recent weight loss or night sweats.  She had repeat PET scan as well as MRI of the brain performed earlier today and she is here for evaluation and discussion of her imaging studies and recommendation regarding her condition.   MEDICAL HISTORY: Past Medical History:  Diagnosis Date   Anemia    Atherosclerosis    GERD (gastroesophageal reflux disease)    Hemoptysis    History of radiation therapy    Left Lung- 02/15/22-02/28/22- Dr. Antony Blackbird    Hyperlipidemia    Hypertension    IBS (irritable bowel syndrome)    Lung mass    Migraines    Spondylosis    Vitamin D deficiency     ALLERGIES:  is allergic to avocado, influenza vaccines, lisinopril, metoprolol, nexium [esomeprazole magnesium], penicillins, pork-derived products, proton pump inhibitors, sulfites, wasp venom protein, zantac [ranitidine hcl], cefaclor, chlorhexidine, chloroxine, ciprofloxacin hcl, clarithromycin, gatifloxacin, other, sulfa antibiotics, sulfamethoxazole-trimethoprim, buckwheat, cat hair extract, dog epithelium (canis lupus familiaris), egg-derived products, fluogen [influenza virus vaccine], pollen extract, short ragweed pollen ext, sulfur, aspartame, chocolate, dilaudid [hydromorphone hcl], fentanyl, ivp dye [iodinated contrast media], ketorolac, milk-related compounds, molds & smuts, oxycodone, prednisolone, shellfish allergy, soap, and tilactase.  MEDICATIONS:  Current Outpatient Medications  Medication Sig Dispense Refill   Carboxymethylcellul-Glycerin (LUBRICATING EYE DROPS OP) Place 1 drop into both eyes daily as needed (dry eyes).     Cholecalciferol (VITAMIN D3) LIQD Take 2,000 Units by mouth daily.     Cyanocobalamin (VITAMIN B-12) 1000 MCG SUBL Place 1 tablet under the tongue once a week.     diltiazem (CARDIZEM CD) 120 MG 24 hr capsule Take 120 mg by mouth daily.     EPINEPHrine 0.3 mg/0.3 mL IJ SOAJ injection Inject 0.3 mg into the muscle as needed for anaphylaxis.     famotidine (PEPCID) 40 MG tablet Take 40 mg by mouth daily.     LORazepam (ATIVAN) 0.5 MG tablet 1 tab p.o. 30 minutes before MRI of the brain or PET scan. 2 tablet 0   Multiple Vitamin (  MULTIVITAMIN) tablet Take 1 tablet by mouth daily.     Omega 3-6-9 Fatty Acids (OMEGA-3 FUSION) LIQD Take by mouth.     osimertinib mesylate (TAGRISSO) 80 MG tablet TAKE 1 TABLET (80 MG TOTAL) BY MOUTH DAILY. 90 tablet 1   OVER THE COUNTER MEDICATION Iron gummies.     PRESCRIPTION MEDICATION Apply 1  application topically 2 (two) times daily as needed for itching. Dr Dallas Schimke 2019     rizatriptan (MAXALT) 10 MG tablet Take 5 mg by mouth as needed for migraine. May repeat in 2 hours if needed (Patient not taking: Reported on 02/03/2022)     No current facility-administered medications for this visit.    SURGICAL HISTORY:  Past Surgical History:  Procedure Laterality Date   BROW LIFT AND BLEPHAROPLASTY  03/15/2013   CATARACT EXTRACTION, BILATERAL  06/23/2014   CHEST TUBE INSERTION Left 05/10/2018   Procedure: CHEST TUBE INSERTION;  Surgeon: Loreli Slot, MD;  Location: [facility] OR;  Service: Thoracic;  Laterality: Left;   COLONOSCOPY  05/2014   ESOPHAGOGASTRODUODENOSCOPY  2019   EYE SURGERY     IR THORACENTESIS ASP PLEURAL SPACE W/IMG GUIDE  05/24/2018   IR THORACENTESIS ASP PLEURAL SPACE W/IMG GUIDE  06/14/2018   TONSILLECTOMY     VIDEO ASSISTED THORACOSCOPY (VATS)/ LOBECTOMY Left 04/25/2018   Procedure: VIDEO ASSISTED THORACOSCOPY (VATS)LEFT LOWER LOBECTOMY, NODE DISSECTION;  Surgeon: Loreli Slot, MD;  Location: [facility] OR;  Service: Thoracic;  Laterality: Left;   VIDEO BRONCHOSCOPY WITH ENDOBRONCHIAL NAVIGATION N/A 03/07/2018   Procedure: VIDEO BRONCHOSCOPY WITH ENDOBRONCHIAL NAVIGATION;  Surgeon: Leslye Peer, MD;  Location: [facility] OR;  Service: Thoracic;  Laterality: N/A;   VIDEO BRONCHOSCOPY WITH INSERTION OF INTERBRONCHIAL VALVE (IBV) N/A 05/10/2018   Procedure: VIDEO BRONCHOSCOPY WITH INSERTION OF INTERBRONCHIAL VALVE (IBV);  Surgeon: Loreli Slot, MD;  Location: [facility] OR;  Service: Thoracic;  Laterality: N/A;   VIDEO BRONCHOSCOPY WITH INSERTION OF INTERBRONCHIAL VALVE (IBV) N/A 06/13/2018   Procedure: VIDEO BRONCHOSCOPY WITH REMOVAL OF INTERBRONCHIAL VALVE (IBV) x 2;  Surgeon: Loreli Slot, MD;  Location: [facility] OR;  Service: Thoracic;  Laterality: N/A;    REVIEW OF SYSTEMS:  Constitutional: positive for fatigue Eyes: negative Ears, nose, mouth, throat, and face:  negative Respiratory: negative Cardiovascular: negative Gastrointestinal: positive for dyspepsia Genitourinary:negative Integument/breast: negative Hematologic/lymphatic: negative Musculoskeletal:negative Neurological: negative Behavioral/Psych: negative Endocrine: negative Allergic/Immunologic: negative   PHYSICAL EXAMINATION: General appearance: alert, cooperative, fatigued, and no distress Head: Normocephalic, without obvious abnormality, atraumatic Neck: no adenopathy, no JVD, supple, symmetrical, trachea midline, and thyroid not enlarged, symmetric, no tenderness/mass/nodules Lymph nodes: Cervical, supraclavicular, and axillary nodes normal. Resp: clear to auscultation bilaterally Back: symmetric, no curvature. ROM normal. No CVA tenderness. Cardio: regular rate and rhythm, S1, S2 normal, no murmur, click, rub or gallop GI: soft, non-tender; bowel sounds normal; no masses,  no organomegaly Extremities: extremities normal, atraumatic, no cyanosis or edema Neurologic: Alert and oriented X 3, normal strength and tone. Normal symmetric reflexes. Normal coordination and gait   ECOG PERFORMANCE STATUS: 1 - Symptomatic but completely ambulatory  Blood pressure (!) 156/72, pulse 87, temperature (!) 97.5 F (36.4 C), temperature source Oral, resp. rate 16, height 5' 4.75" (1.645 m), weight 139 lb 6.4 oz (63.2 kg), SpO2 100%.   LABORATORY DATA: Lab Results  Component Value Date   WBC 5.9 11/28/2022   HGB 9.8 (L) 11/28/2022   HCT 30.1 (L) 11/28/2022   MCV 97.4 11/28/2022   PLT 219 11/28/2022  Chemistry      Component Value Date/Time   NA 136 11/28/2022 1306   K 4.0 11/28/2022 1306   CL 100 11/28/2022 1306   CO2 28 11/28/2022 1306   BUN 15 11/28/2022 1306   CREATININE 0.92 11/28/2022 1306      Component Value Date/Time   CALCIUM 9.0 11/28/2022 1306   ALKPHOS 65 11/28/2022 1306   AST 20 11/28/2022 1306   ALT 12 11/28/2022 1306   BILITOT 0.4 11/28/2022 1306        RADIOGRAPHIC STUDIES: NM PET Image Restage (PS) Skull Base to Thigh (F-18 FDG)  Result Date: 11/28/2022 CLINICAL DATA:  Subsequent treatment strategy for non-small-cell lung cancer. EXAM: NUCLEAR MEDICINE PET SKULL BASE TO THIGH TECHNIQUE: 6.6 mCi F-18 FDG was injected intravenously. Full-ring PET imaging was performed from the skull base to thigh after the radiotracer. CT data was obtained and used for attenuation correction and anatomic localization. Fasting blood glucose: 110 mg/dl COMPARISON:  Chest CT 09/81/1914.  PET-CT 11/22/2021. FINDINGS: Mediastinal blood pool activity: SUV max 2.4 Liver activity: SUV max NA NECK: Hypermetabolic brown fat again identified posterior neck. Hypermetabolic brown fat again noted in the supraclavicular regions. No cervical or supraclavicular lymphadenopathy on CT imaging. Incidental CT findings: None. CHEST: Symmetric nodular areas of FDG accumulation along the thoracic spine bilaterally are similar to prior PET-CT and remain compatible with hypermetabolic brown fat. The scattered tiny subpleural nodules identified on the previous restaging chest CTs show low level FDG uptake. Index nodule deep to the left third rib measures 9 mm today (image 54/series 4) compared to 10 mm on the previous chest CT. This shows low level FDG uptake with SUV max = 2.1. Index 8 mm nodule identified previously anterior to the pulmonary outflow tract of the mediastinum is stable on CT imaging with SUV max = 2.5. 9 mm nodule anterior left base seen previously is 9 mm again today on image 91/4 with SUV max = 2.2. Other adjacent nodules in this region show similar low level FDG accumulation. 14 mm nodule anterior left pleural reflection/mediastinum (99/4) is slightly more conspicuous today with SUV max = 3.5. 12 mm posterior pleural based nodule in the posterior left costophrenic sulcus (image 104/4) shows SUV max = 4.1. 14 mm nodule anterior left chest on 107/4 is similar to prior and  probably in the deep anterior left pleural reflection. SUV max = 3.1. Other scattered foci of similar low level focal FDG accumulation are seen along the inferior left pleura. No suspicious hypermetabolic pulmonary parenchymal nodule. Incidental CT findings: Volume loss left hemithorax is similar to prior consistent with the surgical history. Small left pleural effusion is similar to minimally progressive in the interval. ABDOMEN/PELVIS: No abnormal hypermetabolic activity within the liver, pancreas, adrenal glands, or spleen. No hypermetabolic lymph nodes in the abdomen or pelvis. Incidental CT findings: There is moderate atherosclerotic calcification of the abdominal aorta without aneurysm. Scattered diverticuli are seen in the left colon without diverticulitis. SKELETON: No focal hypermetabolic activity to suggest skeletal metastasis. Incidental CT findings: None. IMPRESSION: 1. Scattered small subpleural nodules in the left hemithorax are similar to prior and generally show low level FDG accumulation. Nodules seem to be somewhat more predominantly anteriorly and inferiorly in the left hemithorax. Discernible FDG accumulation in these tiny nodules is compatible with metastatic disease. Generally, CT appearance is not substantially changed since the 11/01/2022 CT and these nodules appear minimally progressive comparing back to 07/26/2022. 2. No evidence for metastatic disease in the neck, abdomen or  pelvis. 3. Small left pleural effusion is similar to minimally progressive in the interval. 4.  Aortic Atherosclerosis (ICD10-I70.0). Electronically Signed   By: Kennith Center M.D.   On: 11/28/2022 13:14   MR BRAIN W WO CONTRAST  Result Date: 11/28/2022 CLINICAL DATA:  Non-small cell lung cancer.  Staging. EXAM: MRI HEAD WITHOUT AND WITH CONTRAST TECHNIQUE: Multiplanar, multiecho pulse sequences of the brain and surrounding structures were obtained without and with intravenous contrast. CONTRAST:  6mL GADAVIST  GADOBUTROL 1 MMOL/ML IV SOLN COMPARISON:  03/24/2018 FINDINGS: Brain: The brain has a normal appearance without evidence of malformation, atrophy, old or acute small or large vessel infarction, mass lesion, hemorrhage, hydrocephalus or extra-axial collection. After contrast administration, no abnormal brain or leptomeningeal enhancement occurs. Vascular: Major vessels at the base of the brain show flow. Venous sinuses appear patent. Skull and upper cervical spine: Normal. Sinuses/Orbits: Clear/normal. Other: None significant. IMPRESSION: Normal examination. No evidence of metastatic disease. Electronically Signed   By: Paulina Fusi M.D.   On: 11/28/2022 10:42   CT Chest Wo Contrast  Result Date: 11/01/2022 CLINICAL DATA:  Non-small-cell lung cancer. Restaging. * Tracking Code: BO * EXAM: CT CHEST WITHOUT CONTRAST TECHNIQUE: Multidetector CT imaging of the chest was performed following the standard protocol without IV contrast. RADIATION DOSE REDUCTION: This exam was performed according to the departmental dose-optimization program which includes automated exposure control, adjustment of the mA and/or kV according to patient size and/or use of iterative reconstruction technique. COMPARISON:  07/26/2022.  04/25/2022. FINDINGS: Cardiovascular: The heart size is normal. No substantial pericardial effusion. Coronary artery calcification is evident. Mild atherosclerotic calcification is noted in the wall of the thoracic aorta. Mediastinum/Nodes: No mediastinal lymphadenopathy. No evidence for gross hilar lymphadenopathy although assessment is limited by the lack of intravenous contrast on the current study. The esophagus has normal imaging features. There is no axillary lymphadenopathy. Lungs/Pleura: No suspicious pulmonary nodule or mass in the right lung. Volume loss in the left hemithorax is compatible with previous left lower lobectomy and radiation therapy. The post treatment scarring in the parahilar and  infrahilar left lung is similar. Small left pleural effusion with probable sub pulmonic loculated component along the dome of the hemidiaphragm is stable to minimally progressive in the interval. No new suspicious left pulmonary nodule or mass. The pleural based lesion of concern seen previously in the lateral aspect of the upper left hemithorax just deep to the lateral left fourth rib is stable to minimally increased measuring 7 mm today compared to 6 mm previously. More posteriorly and superiorly in the left chest is a new pleural based nodule measuring 7 mm on image 32/2, also deep to the left fourth rib. A new 10 mm pleural based nodule is seen towards the left apex, deep to the third rib on image 25/2. Other scattered tiny pleural nodules are seen elsewhere in the left hemithorax. There is a new 9 mm anterior subpleural nodule on the left (image 60/2). Several additional tiny soft tissue nodules are seen in the same region. 9 mm nodule anterior left base on image 110/2 is new since prior. 8 mm nodule identified in the anterior mediastinum, anterior to the pulmonary outflow on image 85/2, is also new since prior. Mild pleural thickening seen along the paraspinal left pleura, consistent with post treatment change but no discrete residual nodularity in this region today. Upper Abdomen: Visualized portion of the upper abdomen is unremarkable. Musculoskeletal: No worrisome lytic or sclerotic osseous abnormality. IMPRESSION: 1. Mild interval progression  of previous pleural based nodularity in the left hemithorax with new subpleural nodules in the left hemithorax on today's exam. Imaging features are concerning for progression. 2. 8 mm nodule anterior to the pulmonary outflow tract. This is new in the interval and concerning for metastatic disease potentially in a pleural reflection or in the anterior mediastinum. 3. No substantial change in small left pleural effusion with probable sub pulmonic loculated component  along the dome of the hemidiaphragm. 4. Stable post treatment changes in the left hemithorax. 5.  Aortic Atherosclerosis (ICD10-I70.0). Electronically Signed   By: Kennith Center M.D.   On: 11/01/2022 12:25    ASSESSMENT AND PLAN: This is a very pleasant 72 years old white female with a stage IIa non-small cell lung cancer, adenocarcinoma with positive EGFR mutation in exon 21 (L858R) status post left lower lobectomy with lymph node dissection under the care of Dr. Dorris Fetch. She declined to proceed with adjuvant systemic chemotherapy or targeted therapy. The patient was on observation since her surgical resection until she had disease recurrence in February 2021 presented with pleural-based masses in the left lung with new biopsy and molecular studies positive for EGFR mutation exon 21 (L858R). The patient started treatment with Tagrisso 80 mg p.o. daily status post almost 41 months of treatment.  She has been tolerating her treatment with Tagrisso fairly well with no concerning adverse effects except for mild rash and diarrhea. Imaging studies including CT scan of the chest followed by PET scan in August 2023 showed disease progression and the pleural-based masses in the left lung. The patient underwent CT-guided core biopsy of one of the pleural-based left lung progressive lesion and the final pathology was consistent with metastatic adenocarcinoma. The molecular studies by [facility] and it was positive for EGFR L718V, L858R.  She has negative PD-L1 expression. The patient was referred to Dr. Roselind Messier and she underwent IMRT to the progressive lesion of the left lung pleural-based masses.   She continued her treatment with Tagrisso 80 mg p.o. daily and she has been tolerating the treatment well except for the fatigue. She was found on previous imaging studies with CT scan of the chest to have increase in size of pleural-based nodularity.  I ordered several studies including a PET scan as well as MRI  of the brain that were performed earlier today. I personally and independently reviewed the scan images and discussed the result with the patient and her husband. MRI of the brain was negative for metastatic disease to the brain and the PET scan showed the scattered small subpleural nodules in the left hemothorax that are similar to the prior imaging studies and generally showed low-level FDG accumulation.  The nodule seem to be somewhat more predominantly anteriorly and inferior in the left hemithorax still concerning for metastatic disease with the low FDG accumulation but no significant change compared to the scan performed with few weeks ago and minimally progressive since April 2024.  There was no evidence for metastatic disease in the neck, abdomen or pelvis. I had a lengthy discussion with the patient today about her condition and we discussed the previous options again including continuous treatment with Tagrisso and close monitoring with future imaging studies versus treatment with afatinib because of the development of EGFR L718V mutation versus consideration of clinical trial by Dr. Rosanna Randy in [place] and the patient has an appointment with her in few weeks.  The other option would be a combination of Tagrisso in addition to systemic chemotherapy with carboplatin, Alimta +/-  Avastin or treatment with Amivantamab and lazertinib either on a clinical trial or compassionate use. The patient will think about this option and also discuss it with Dr. Rosanna Randy in her upcoming visit.  I will arrange for her to come back for follow-up visit in 2 months with repeat blood work and scan if there is no change in her treatment plan based on her upcoming visit in [place]. The patient was advised to call immediately if she has any other concerning symptoms in the interval. For the anemia she will continue with the oral iron tablet for now. For the dyspepsia she is currently on Pepcid.  The patient voices understanding  of current disease status and treatment options and is in agreement with the current care plan. All questions were answered. The patient knows to call the clinic with any problems, questions or concerns. We can certainly see the patient much sooner if necessary. The total time spent in the appointment was 45 minutes.  Disclaimer: This note was dictated with voice recognition software. Similar sounding words can inadvertently be transcribed and may not be corrected upon review.

## 2022-11-30 ENCOUNTER — Other Ambulatory Visit (HOSPITAL_COMMUNITY): Payer: Self-pay

## 2022-12-02 ENCOUNTER — Other Ambulatory Visit (HOSPITAL_COMMUNITY): Payer: Self-pay

## 2022-12-06 ENCOUNTER — Ambulatory Visit: Payer: Medicare Other | Admitting: Internal Medicine

## 2022-12-06 ENCOUNTER — Other Ambulatory Visit: Payer: Medicare Other

## 2022-12-22 ENCOUNTER — Other Ambulatory Visit (HOSPITAL_COMMUNITY): Payer: Self-pay

## 2022-12-26 ENCOUNTER — Other Ambulatory Visit (HOSPITAL_COMMUNITY): Payer: Self-pay

## 2022-12-26 ENCOUNTER — Other Ambulatory Visit: Payer: Self-pay | Admitting: Internal Medicine

## 2022-12-26 ENCOUNTER — Other Ambulatory Visit: Payer: Self-pay

## 2022-12-26 DIAGNOSIS — C3492 Malignant neoplasm of unspecified part of left bronchus or lung: Secondary | ICD-10-CM

## 2022-12-26 MED ORDER — OSIMERTINIB MESYLATE 80 MG PO TABS
ORAL_TABLET | Freq: Every day | ORAL | 1 refills | Status: DC
Start: 2022-12-26 — End: 2023-01-30
  Filled 2022-12-26: qty 30, 30d supply, fill #0

## 2023-01-03 ENCOUNTER — Encounter: Payer: Self-pay | Admitting: Internal Medicine

## 2023-01-10 ENCOUNTER — Telehealth: Payer: Self-pay

## 2023-01-10 ENCOUNTER — Other Ambulatory Visit: Payer: Self-pay

## 2023-01-10 DIAGNOSIS — C3490 Malignant neoplasm of unspecified part of unspecified bronchus or lung: Secondary | ICD-10-CM

## 2023-01-10 NOTE — Telephone Encounter (Signed)
As per your last note to the patient she has agreed to do the Guardant 360.   I agree with proceeding with the study at [facility] with Dr. Allena Katz or Dr. Janann August.  I will be happy to order Guardant360 for the molecular studies.  It may be ideal to have it close to the enrollment in the study.  It usually takes around 1 week.  It is not fasting.  Please let me know when they would like to come and do it and we will order it that day.  Thank you   I have made the patient an appointment and put the order in for it to be drawn tomorrow at 10:30.

## 2023-01-11 ENCOUNTER — Inpatient Hospital Stay: Payer: Medicare Other | Attending: Internal Medicine

## 2023-01-11 DIAGNOSIS — C3490 Malignant neoplasm of unspecified part of unspecified bronchus or lung: Secondary | ICD-10-CM

## 2023-01-11 LAB — GUARDANT 360

## 2023-01-23 ENCOUNTER — Encounter: Payer: Self-pay | Admitting: Internal Medicine

## 2023-01-25 ENCOUNTER — Other Ambulatory Visit: Payer: Self-pay

## 2023-01-27 ENCOUNTER — Other Ambulatory Visit: Payer: Self-pay

## 2023-01-30 ENCOUNTER — Other Ambulatory Visit: Payer: Self-pay

## 2023-01-30 ENCOUNTER — Other Ambulatory Visit (HOSPITAL_COMMUNITY): Payer: Self-pay

## 2023-01-30 ENCOUNTER — Inpatient Hospital Stay: Payer: Medicare Other | Attending: Internal Medicine

## 2023-01-30 ENCOUNTER — Encounter (HOSPITAL_COMMUNITY): Payer: Self-pay

## 2023-01-30 ENCOUNTER — Telehealth: Payer: Self-pay | Admitting: Pharmacy Technician

## 2023-01-30 ENCOUNTER — Ambulatory Visit (HOSPITAL_COMMUNITY)
Admission: RE | Admit: 2023-01-30 | Discharge: 2023-01-30 | Disposition: A | Discharge status: Home / Self Care | Payer: Medicare Other | Source: Ambulatory Visit | Attending: Internal Medicine | Admitting: Internal Medicine

## 2023-01-30 ENCOUNTER — Inpatient Hospital Stay (HOSPITAL_BASED_OUTPATIENT_CLINIC_OR_DEPARTMENT_OTHER): Payer: Medicare Other | Admitting: Internal Medicine

## 2023-01-30 ENCOUNTER — Telehealth: Payer: Self-pay | Admitting: Pharmacist

## 2023-01-30 VITALS — BP 156/69 | HR 94 | Temp 98.1°F | Resp 16 | Ht 64.75 in | Wt 134.2 lb

## 2023-01-30 DIAGNOSIS — Z902 Acquired absence of lung [part of]: Secondary | ICD-10-CM | POA: Insufficient documentation

## 2023-01-30 DIAGNOSIS — C3490 Malignant neoplasm of unspecified part of unspecified bronchus or lung: Secondary | ICD-10-CM | POA: Insufficient documentation

## 2023-01-30 DIAGNOSIS — R1013 Epigastric pain: Secondary | ICD-10-CM | POA: Insufficient documentation

## 2023-01-30 DIAGNOSIS — R059 Cough, unspecified: Secondary | ICD-10-CM | POA: Diagnosis not present

## 2023-01-30 DIAGNOSIS — C3432 Malignant neoplasm of lower lobe, left bronchus or lung: Secondary | ICD-10-CM | POA: Insufficient documentation

## 2023-01-30 DIAGNOSIS — Z923 Personal history of irradiation: Secondary | ICD-10-CM | POA: Insufficient documentation

## 2023-01-30 DIAGNOSIS — R0609 Other forms of dyspnea: Secondary | ICD-10-CM | POA: Diagnosis not present

## 2023-01-30 DIAGNOSIS — C3492 Malignant neoplasm of unspecified part of left bronchus or lung: Secondary | ICD-10-CM | POA: Diagnosis not present

## 2023-01-30 DIAGNOSIS — D649 Anemia, unspecified: Secondary | ICD-10-CM | POA: Insufficient documentation

## 2023-01-30 DIAGNOSIS — R110 Nausea: Secondary | ICD-10-CM | POA: Insufficient documentation

## 2023-01-30 LAB — CBC WITH DIFFERENTIAL (CANCER CENTER ONLY)
Abs Immature Granulocytes: 0.01 10*3/uL (ref 0.00–0.07)
Basophils Absolute: 0 10*3/uL (ref 0.0–0.1)
Basophils Relative: 0 %
Eosinophils Absolute: 0.1 10*3/uL (ref 0.0–0.5)
Eosinophils Relative: 2 %
HCT: 28.8 % — ABNORMAL LOW (ref 36.0–46.0)
Hemoglobin: 9.4 g/dL — ABNORMAL LOW (ref 12.0–15.0)
Immature Granulocytes: 0 %
Lymphocytes Relative: 16 %
Lymphs Abs: 1.3 10*3/uL (ref 0.7–4.0)
MCH: 31.9 pg (ref 26.0–34.0)
MCHC: 32.6 g/dL (ref 30.0–36.0)
MCV: 97.6 fL (ref 80.0–100.0)
Monocytes Absolute: 0.7 10*3/uL (ref 0.1–1.0)
Monocytes Relative: 9 %
Neutro Abs: 5.8 10*3/uL (ref 1.7–7.7)
Neutrophils Relative %: 73 %
Platelet Count: 316 10*3/uL (ref 150–400)
RBC: 2.95 MIL/uL — ABNORMAL LOW (ref 3.87–5.11)
RDW: 12.9 % (ref 11.5–15.5)
WBC Count: 7.9 10*3/uL (ref 4.0–10.5)
nRBC: 0 % (ref 0.0–0.2)

## 2023-01-30 LAB — CMP (CANCER CENTER ONLY)
ALT: 11 U/L (ref 0–44)
AST: 22 U/L (ref 15–41)
Albumin: 4.2 g/dL (ref 3.5–5.0)
Alkaline Phosphatase: 61 U/L (ref 38–126)
Anion gap: 8 (ref 5–15)
BUN: 16 mg/dL (ref 8–23)
CO2: 27 mmol/L (ref 22–32)
Calcium: 9.2 mg/dL (ref 8.9–10.3)
Chloride: 98 mmol/L (ref 98–111)
Creatinine: 0.99 mg/dL (ref 0.44–1.00)
GFR, Estimated: >60 mL/min (ref >60)
Glucose, Bld: 105 mg/dL — ABNORMAL HIGH (ref 70–99)
Potassium: 3.9 mmol/L (ref 3.5–5.1)
Sodium: 133 mmol/L — ABNORMAL LOW (ref 135–145)
Total Bilirubin: 0.3 mg/dL (ref 0.3–1.2)
Total Protein: 6.8 g/dL (ref 6.5–8.1)

## 2023-01-30 MED ORDER — AFATINIB DIMALEATE 30 MG PO TABS
30.0000 mg | ORAL_TABLET | Freq: Every day | ORAL | 2 refills | Status: DC
  Filled 2023-01-31: qty 30, 30d supply, fill #0
  Filled 2023-02-24: qty 30, 30d supply, fill #1
  Filled 2023-03-21: qty 30, 30d supply, fill #2

## 2023-01-30 NOTE — Progress Notes (Signed)
[facility] Telephone:([phone]) [phone]   Fax:(336) [phone]  OFFICE PROGRESS NOTE  [name], MD [address] [place] [address]  DIAGNOSIS: Recurrent non-small cell lung cancer initially diagnosed as stage IIA (T2b, N0, M0) non-small cell lung cancer, adenocarcinoma diagnosed in November 2019  Molecular studies showed EGFR mutation in exon 21 (L858R) PD-L1 expression 0  Biomarker Findings on disease progression in 02/2022 Microsatellite status - MS-Stable Tumor Mutational Burden - 4 Muts/Mb Genomic Findings For a complete list of the genes assayed, please refer to the Appendix. EGFR L718V, L858R CTNNB1 D32Y SMAD4 E330K 7 Disease relevant genes with no reportable alterations: ALK, BRAF, ERBB2, KRAS, MET, RET, ROS1  Molecular studies by [id number] performed in September 2024 showed similar results  PRIOR THERAPY:  1) status post left lower lobectomy with lymph node dissection on April 25, 2018 under the care of Dr. Dorris Fetch.  She declined adjuvant therapy 2) status post palliative radiotherapy with IMRT to the progressive pleural-based lung masses under the care of Dr. Roselind Messier completed on February 28, 2022. 3) Osimertinib (Tagrisso) 80 mg p.o. daily.  First dose was on June 19, 2019.  Status post 43 months of treatment.  CURRENT THERAPY: Afatinib 30 mg p.o. daily.  Expected to start in the next few days.  INTERVAL HISTORY: [name] 72 y.o. female returns to the clinic today for follow-up visit accompanied by her son Jeannett Senior and friend Dr. Dwyane Dee a retired Marine scientist.  The patient is currently under a lot of stress after suddenly losing her husband Casimiro Needle was a sudden death.  Her son Jeannett Senior is here from [place] to help her during this difficult time.  The patient is complaining of shortness of breath with exertion as well as cough.  She denied having any current chest pain or hemoptysis.  She continues to have intermittent nausea  but no significant vomiting, diarrhea or constipation.  She has no headache or visual changes.  She has no recent weight loss or night sweats.  She has been tolerating her treatment with Tagrisso fairly well for more than 43 months.  She was considered for a clinical trial with a phase 1-2 study to assess BDTX-1535 and oral EGFR inhibitor at [facility] but because of her recent situation with the loss of her husband and a lot of stress in her life she does not think that she will be able to proceed with the clinical trial and she is here for reevaluation and discussion of other treatment options.  She had repeat blood work as well as CT scan of the chest performed earlier today and she is here for evaluation and discussion of her scan and treatment options.   MEDICAL HISTORY: Past Medical History:  Diagnosis Date   Anemia    Atherosclerosis    GERD (gastroesophageal reflux disease)    Hemoptysis    History of radiation therapy    Left Lung- 02/15/22-02/28/22- Dr. Antony Blackbird   Hyperlipidemia    Hypertension    IBS (irritable bowel syndrome)    Lung mass    Migraines    Spondylosis    Vitamin D deficiency     ALLERGIES:  is allergic to avocado, influenza vaccines, lisinopril, metoprolol, nexium [esomeprazole magnesium], penicillins, pork-derived products, proton pump inhibitors, sulfites, wasp venom protein, zantac [ranitidine hcl], cefaclor, chlorhexidine, chloroxine, ciprofloxacin hcl, clarithromycin, gatifloxacin, other, sulfa antibiotics, sulfamethoxazole-trimethoprim, buckwheat, cat hair extract, dog epithelium (canis lupus familiaris), egg-derived products, fluogen [influenza virus vaccine],  pollen extract, short ragweed pollen ext, sulfur, aspartame, chocolate, dilaudid [hydromorphone hcl], fentanyl, ivp dye [iodinated contrast media], ketorolac, milk-related compounds, molds & smuts, oxycodone, prednisolone, shellfish allergy, soap, and tilactase.  MEDICATIONS:  Current  Outpatient Medications  Medication Sig Dispense Refill   Carboxymethylcellul-Glycerin (LUBRICATING EYE DROPS OP) Place 1 drop into both eyes daily as needed (dry eyes).     Cholecalciferol (VITAMIN D3) LIQD Take 2,000 Units by mouth daily.     Cyanocobalamin (VITAMIN B-12) 1000 MCG SUBL Place 1 tablet under the tongue once a week.     diltiazem (CARDIZEM CD) 120 MG 24 hr capsule Take 120 mg by mouth daily.     EPINEPHrine 0.3 mg/0.3 mL IJ SOAJ injection Inject 0.3 mg into the muscle as needed for anaphylaxis.     famotidine (PEPCID) 40 MG tablet Take 40 mg by mouth daily.     LORazepam (ATIVAN) 0.5 MG tablet 1 tab p.o. 30 minutes before MRI of the brain or PET scan. 2 tablet 0   Multiple Vitamin (MULTIVITAMIN) tablet Take 1 tablet by mouth daily.     Omega 3-6-9 Fatty Acids (OMEGA-3 FUSION) LIQD Take by mouth.     osimertinib mesylate (TAGRISSO) 80 MG tablet TAKE 1 TABLET (80 MG TOTAL) BY MOUTH DAILY. 90 tablet 1   OVER THE COUNTER MEDICATION Iron gummies.     PRESCRIPTION MEDICATION Apply 1 application topically 2 (two) times daily as needed for itching. Dr Dallas Schimke 2019     rizatriptan (MAXALT) 10 MG tablet Take 5 mg by mouth as needed for migraine. May repeat in 2 hours if needed (Patient not taking: Reported on 02/03/2022)     No current facility-administered medications for this visit.    SURGICAL HISTORY:  Past Surgical History:  Procedure Laterality Date   BROW LIFT AND BLEPHAROPLASTY  03/15/2013   CATARACT EXTRACTION, BILATERAL  06/23/2014   CHEST TUBE INSERTION Left 05/10/2018   Procedure: CHEST TUBE INSERTION;  Surgeon: Loreli Slot, MD;  Location: [facility] OR;  Service: Thoracic;  Laterality: Left;   COLONOSCOPY  05/2014   ESOPHAGOGASTRODUODENOSCOPY  2019   EYE SURGERY     IR THORACENTESIS ASP PLEURAL SPACE W/IMG GUIDE  05/24/2018   IR THORACENTESIS ASP PLEURAL SPACE W/IMG GUIDE  06/14/2018   TONSILLECTOMY     VIDEO ASSISTED THORACOSCOPY (VATS)/ LOBECTOMY Left 04/25/2018    Procedure: VIDEO ASSISTED THORACOSCOPY (VATS)LEFT LOWER LOBECTOMY, NODE DISSECTION;  Surgeon: Loreli Slot, MD;  Location: [facility] OR;  Service: Thoracic;  Laterality: Left;   VIDEO BRONCHOSCOPY WITH ENDOBRONCHIAL NAVIGATION N/A 03/07/2018   Procedure: VIDEO BRONCHOSCOPY WITH ENDOBRONCHIAL NAVIGATION;  Surgeon: Leslye Peer, MD;  Location: [facility] OR;  Service: Thoracic;  Laterality: N/A;   VIDEO BRONCHOSCOPY WITH INSERTION OF INTERBRONCHIAL VALVE (IBV) N/A 05/10/2018   Procedure: VIDEO BRONCHOSCOPY WITH INSERTION OF INTERBRONCHIAL VALVE (IBV);  Surgeon: Loreli Slot, MD;  Location: [facility] OR;  Service: Thoracic;  Laterality: N/A;   VIDEO BRONCHOSCOPY WITH INSERTION OF INTERBRONCHIAL VALVE (IBV) N/A 06/13/2018   Procedure: VIDEO BRONCHOSCOPY WITH REMOVAL OF INTERBRONCHIAL VALVE (IBV) x 2;  Surgeon: Loreli Slot, MD;  Location: [facility] OR;  Service: Thoracic;  Laterality: N/A;    REVIEW OF SYSTEMS:  Constitutional: positive for fatigue Eyes: negative Ears, nose, mouth, throat, and face: negative Respiratory: positive for cough and dyspnea on exertion Cardiovascular: negative Gastrointestinal: positive for dyspepsia and nausea Genitourinary:negative Integument/breast: negative Hematologic/lymphatic: negative Musculoskeletal:negative Neurological: negative Behavioral/Psych: negative Endocrine: negative Allergic/Immunologic: negative   PHYSICAL EXAMINATION: General appearance: alert, cooperative,  fatigued, and no distress Head: Normocephalic, without obvious abnormality, atraumatic Neck: no adenopathy, no JVD, supple, symmetrical, trachea midline, and thyroid not enlarged, symmetric, no tenderness/mass/nodules Lymph nodes: Cervical, supraclavicular, and axillary nodes normal. Resp: diminished breath sounds LLL and dullness to percussion LLL Back: symmetric, no curvature. ROM normal. No CVA tenderness. Cardio: regular rate and rhythm, S1, S2 normal, no murmur, click, rub or  gallop GI: soft, non-tender; bowel sounds normal; no masses,  no organomegaly Extremities: extremities normal, atraumatic, no cyanosis or edema Neurologic: Alert and oriented X 3, normal strength and tone. Normal symmetric reflexes. Normal coordination and gait   ECOG PERFORMANCE STATUS: 1 - Symptomatic but completely ambulatory  Blood pressure (!) 156/69, pulse 94, temperature 98.1 F (36.7 C), temperature source Temporal, resp. rate 16, height 5' 4.75" (1.645 m), weight 134 lb 3.2 oz (60.9 kg), SpO2 100%.   LABORATORY DATA: Lab Results  Component Value Date   WBC 7.9 01/30/2023   HGB 9.4 (L) 01/30/2023   HCT 28.8 (L) 01/30/2023   MCV 97.6 01/30/2023   PLT 316 01/30/2023      Chemistry      Component Value Date/Time   NA 133 (L) 01/30/2023 1104   K 3.9 01/30/2023 1104   CL 98 01/30/2023 1104   CO2 27 01/30/2023 1104   BUN 16 01/30/2023 1104   CREATININE 0.99 01/30/2023 1104      Component Value Date/Time   CALCIUM 9.2 01/30/2023 1104   ALKPHOS 61 01/30/2023 1104   AST 22 01/30/2023 1104   ALT 11 01/30/2023 1104   BILITOT 0.3 01/30/2023 1104       RADIOGRAPHIC STUDIES: CT Chest Wo Contrast  Result Date: 01/30/2023 CLINICAL DATA:  Non-small-cell lung cancer. Restaging. * Tracking Code: BO * EXAM: CT CHEST WITHOUT CONTRAST TECHNIQUE: Multidetector CT imaging of the chest was performed following the standard protocol without IV contrast. RADIATION DOSE REDUCTION: This exam was performed according to the departmental dose-optimization program which includes automated exposure control, adjustment of the mA and/or kV according to patient size and/or use of iterative reconstruction technique. COMPARISON:  PET-CT 11/28/2022.  Chest CT 11/01/2022. FINDINGS: Cardiovascular: The heart size is normal. No substantial pericardial effusion. Coronary artery calcification is evident. Mild atherosclerotic calcification is noted in the wall of the thoracic aorta. Mediastinum/Nodes: No  mediastinal lymphadenopathy. No evidence for gross hilar lymphadenopathy although assessment is limited by the lack of intravenous contrast on the current study. The esophagus has normal imaging features. There is no axillary lymphadenopathy. Lungs/Pleura: Surgical changes and volume loss again noted left hemithorax. No new pulmonary parenchymal nodule within either lung. Left pleural effusion has increased in the interval, now moderate to large in size. There is mild adjacent collapse/consolidation in the adjacent left lower lung compatible with atelectasis. Left-sided pleural nodularity again noted. Index high left lateral apical pleural nodule deep to the left third rib measured previously at 10 mm is partially obscured by pleural fluid today but measures approximately 10 mm again on image 25/2. 7 mm posterolateral pleural nodule measured previously is 7 mm again today on 34/2. A third index left lateral pleural nodule measured previously at 7 mm is obscured by pleural fluid on today's study. 9 mm anterior pleural nodule measured previously is 11 mm today on 55/2 and has become confluent with an additional smaller nodule seen on the previous study, generating a plaque-like lesion measuring on the order of 24 x 5 mm on 54/2. 8 mm nodule seen previously in the anterior mediastinum versus medial  pleural reflection is 14 mm today (image 81/2) and has become confluent with the adjacent 7 mm nodule seen previously generating a plaque-like appearance measuring on the order of 25 x 8 mm (81/2). 9 mm nodule along the anterior left base measured previously is 14 mm today on image 101/2. 22 x 9 mm nodule anterior to the heart on 113/2, not reported previously but described today for comparison purposes has increased from 14 x 7 mm previously. Probable 22 mm pleural base nodule posterior left costophrenic sulcus (image 122/2) more conspicuous today, possibly due to the increase in pleural fluid. Additional nodular foci of  soft tissue in the anterior lower left chest, likely along the pleural reflection or lobe mediastinum show similar mild progression as the above described index nodules. Upper Abdomen: Visualized portion of the upper abdomen is unremarkable. Small soft tissue nodule seen in the region of the GE junction and described on prior studies are stable in the interval. Musculoskeletal: No worrisome lytic or sclerotic osseous abnormality. IMPRESSION: 1. Overall generalized trend of continued interval progression of known pleural disease in the left hemithorax and left anteromedial pleural reflection versus anterior mediastinum. 2. Interval increase in left pleural effusion, now moderate to large in size with adjacent mild atelectasis in the lower left lung. 3. Stable small soft tissue nodules in the region of the GE junction. 4.  Aortic Atherosclerosis (ICD10-I70.0). Electronically Signed   By: Kennith Center M.D.   On: 01/30/2023 13:10    ASSESSMENT AND PLAN: This is a very pleasant 72 years old white female with a stage IIa non-small cell lung cancer, adenocarcinoma with positive EGFR mutation in exon 21 (L858R) status post left lower lobectomy with lymph node dissection under the care of Dr. Dorris Fetch. She declined to proceed with adjuvant systemic chemotherapy or targeted therapy. The patient was on observation since her surgical resection until she had disease recurrence in February 2021 presented with pleural-based masses in the left lung with new biopsy and molecular studies positive for EGFR mutation exon 21 (L858R). The patient started treatment with Tagrisso 80 mg p.o. daily status post almost 43 months of treatment.  The patient underwent CT-guided core biopsy of one of the pleural-based left lung progressive lesion and the final pathology was consistent with metastatic adenocarcinoma. The molecular studies by [facility] and it was positive for EGFR L718V, L858R.  She has negative PD-L1 expression.  She  also had molecular studies by [employer] performed recently and it showed similar results with many other none actionable mutations in lung cancer. The patient was seen by Dr. Roselind Messier and underwent IMRT to the progressive lesion of the left lung pleural-based masses.   She continued her treatment with Tagrisso 80 mg p.o. daily and she has been tolerating the treatment well except for the fatigue. She had repeat CT scan of the chest performed earlier today and unfortunately it showed overall generalized trend of continued interval progression of her known pleural disease in the left hemithorax and left anterior medial pleural reflection versus anterior mediastinum with interval increase in the left pleural effusion that is now moderate to large in size with adjacent mild atelectasis in the lower left lung and stable soft tissue nodules in the region of the GE junction. I had a lengthy discussion with the patient and her son and friend today about her current condition and treatment options.  The patient is currently under a lot of stress after the loss of her husband recently.  She does not think  that she will be able to get enrolled in any clinical trials at this point with the frequent required visit and labs for the clinical trial. I discussed with the patient several options for management of her condition including continued treatment with Tagrisso but adding systemic chemotherapy with carboplatin and Alimta versus discontinuing treatment with Tagrisso and switching her to afatinib which has activity in patient with the uncommon mutation in EGFR L718V in addition to the common L858R mutation versus discontinuing treatment with Tagrisso and starting the patient on treatment with systemic chemotherapy in addition to Amivantamab but this is usually associated with significant hypersensitivity reaction especially during the first 2 doses. The patient was also given the option of revisiting Dr. Allena Katz at [facility] for discussion of the clinical trial with the BDTX-1535 oral EGFR inhibitor. After a lengthy discussion the patient decided to proceed with treatment with afatinib and this will be given at a dose of 30 mg p.o. daily.  I do not think the patient will be able to tolerate the 40 mg tablet because of the significant adverse effects with the higher dose and similar efficacy with the 30 mg dose. We discussed with the patient the adverse effect of this treatment and she will meet with the pharmacist for oral oncolytic for education and also to help the patient in obtaining her medications. For the enlarging left-sided pleural effusion, we will arrange for the patient to undergo ultrasound-guided left thoracentesis in the next few days. Follow the multiple mutations seen on her molecular studies by [facility] as well as [employer], I will refer the patient to genetic counseling to discuss the risk of any of these mutations on her or her family. I will see the patient back for follow-up visit in around 3 weeks for reevaluation and management of any adverse effect of her treatment. For the anemia, the patient will continue with the oral iron supplement. For the dyspepsia she is currently on Pepcid. The patient was advised to call immediately if she has any other concerning symptoms in the interval.  The patient voices understanding of current disease status and treatment options and is in agreement with the current care plan. All questions were answered. The patient knows to call the clinic with any problems, questions or concerns. We can certainly see the patient much sooner if necessary. The total time spent in the appointment was 60 minutes.  Disclaimer: This note was dictated with voice recognition software. Similar sounding words can inadvertently be transcribed and may not be corrected upon review.

## 2023-01-30 NOTE — Telephone Encounter (Signed)
Oral Chemotherapy Pharmacist Encounter  I met with patient, patient's son, and family friend for overview of: Gilotrif (afatinib) for the treatment of metastatic, EGFR mutation-positive (L718V) NSCLC (previously treated with osimertinib), planned duration until disease progression or unacceptable toxicity.   Counseled patient on administration, dosing, side effects, monitoring, drug-food interactions, safe handling, storage, and disposal.  CBC w/ Diff and CMP from 01/30/23 assessed, no relevant lab abnormalities requiring baseline dose adjustment required at this time. Patient starting on dose reduction per MD. Prescription dose and frequency assessed for appropriateness.  Patient will take Gilotrif 30 tablets, 1 tablet by mouth once daily, on an empty stomach, 1 hour before or 2 hours after a meal.  Gilotrif start date: pending medication approval and acquisition - will update with exact start date  Adverse effects include but are not limited to: rash, diarrhea, nausea, vomiting, decreased appetitie, fatigue, mouth sores Diarrhea: Patient will obtain Imodium (loperamide) to have on hand if they experience diarrhea. Patient knows to alert the office of 4 or more loose stools above baseline. Rash: Patient knows to keep skin moisturized with fragrant free cream while on Gilotrif. Patient states that she had whole body rash while on Tagrisso, and knows to notify office if this occurs with Gilotrif.   Reviewed with patient importance of keeping a medication schedule and plan for any missed doses. No barriers to medication adherence identified.  Medication reconciliation performed and medication/allergy list updated. Current medication list in Epic reviewed, no relevant/significant DDIs with Gilotrif identified.  All questions answered.  Patient agreement for treatment documented in MD note on 01/30/23.  Ms. Leeson voiced understanding and appreciation.   Medication education handout given to  patient. Patient knows to call the office with questions or concerns. Oral Chemotherapy Clinic phone number provided to patient.   Lenord Carbo, PharmD, BCPS, [facility] Hematology/Oncology Clinical Pharmacist [facility] and [facility] Oral Chemotherapy Navigation Clinics [phone] 01/30/2023 3:44 PM

## 2023-01-30 NOTE — Telephone Encounter (Signed)
Oral Oncology Patient Advocate Encounter   Received notification that prior authorization for Gilotrif is required.   PA submitted on 01/30/23 Key [id number] Status is pending     [name], CPhT-Adv Oncology Pharmacy Patient Advocate [facility] Direct Number: [phone]  Fax: [phone]

## 2023-01-31 ENCOUNTER — Other Ambulatory Visit: Payer: Self-pay

## 2023-01-31 ENCOUNTER — Other Ambulatory Visit: Payer: Self-pay | Admitting: Pharmacy Technician

## 2023-01-31 ENCOUNTER — Telehealth: Payer: Self-pay | Admitting: Medical Oncology

## 2023-01-31 ENCOUNTER — Other Ambulatory Visit: Payer: Medicare Other

## 2023-01-31 ENCOUNTER — Other Ambulatory Visit (HOSPITAL_COMMUNITY): Payer: Self-pay

## 2023-01-31 ENCOUNTER — Other Ambulatory Visit: Payer: Self-pay | Admitting: Internal Medicine

## 2023-01-31 MED ORDER — LORAZEPAM 0.5 MG PO TABS: ORAL_TABLET | ORAL | 0 refills | Status: AC

## 2023-01-31 NOTE — Telephone Encounter (Addendum)
Requests ativan rx prior to thoracentesis. She needs to pick it up by tomorrow since procedure is at 1000 in [place].  [id number] - [place],  - [address]

## 2023-01-31 NOTE — Progress Notes (Signed)
Oral Chemotherapy Pharmacist Encounter  Patient was counseled under telephone encounter from 01/30/23. Patient counseled in clinic on 01/30/23.  Lenord Carbo, PharmD, BCPS, [facility] Hematology/Oncology Clinical Pharmacist [facility] and [facility] Oral Chemotherapy Navigation Clinics [phone] 01/31/2023 11:43 AM

## 2023-01-31 NOTE — Progress Notes (Signed)
Specialty Pharmacy Initial Fill Coordination Note  [name] is a 72 y.o. female contacted today regarding refills of specialty medication(s) Afatinib Dimaleate .  Patient requested Delivery  on 02/03/23  to verified address [address]   [place] [address]   Medication will be filled on 02/01/23.   Patient is aware of $0 copayment.

## 2023-01-31 NOTE — Telephone Encounter (Signed)
Oral Oncology Patient Advocate Encounter  Prior Authorization for [place] has been approved.    PA# [id number] Effective dates: 01/31/23 through 01/31/24  Patients co-pay is $0.  Patient has met their OOP max for 2024. This cost is subject to change when the plan year resets January 1st.    Jinger Neighbors, CPhT-Adv Oncology Pharmacy Patient Advocate [facility] Direct Number: [phone]  Fax: [phone]

## 2023-02-01 ENCOUNTER — Other Ambulatory Visit: Payer: Self-pay

## 2023-02-02 ENCOUNTER — Ambulatory Visit (HOSPITAL_COMMUNITY)
Admission: RE | Admit: 2023-02-02 | Discharge: 2023-02-02 | Disposition: A | Discharge status: Home / Self Care | Payer: Medicare Other | Source: Ambulatory Visit | Attending: Radiology | Admitting: Radiology

## 2023-02-02 ENCOUNTER — Ambulatory Visit (HOSPITAL_COMMUNITY)
Admission: RE | Admit: 2023-02-02 | Discharge: 2023-02-02 | Disposition: A | Discharge status: Home / Self Care | Payer: Medicare Other | Source: Ambulatory Visit | Attending: Internal Medicine | Admitting: Internal Medicine

## 2023-02-02 ENCOUNTER — Other Ambulatory Visit (HOSPITAL_COMMUNITY): Payer: Self-pay | Admitting: Radiology

## 2023-02-02 DIAGNOSIS — J90 Pleural effusion, not elsewhere classified: Secondary | ICD-10-CM | POA: Diagnosis present

## 2023-02-02 DIAGNOSIS — C3492 Malignant neoplasm of unspecified part of left bronchus or lung: Secondary | ICD-10-CM

## 2023-02-02 HISTORY — PX: IR THORACENTESIS ASP PLEURAL SPACE W/IMG GUIDE: IMG5380

## 2023-02-02 MED ORDER — LIDOCAINE HCL 1 % IJ SOLN
INTRAMUSCULAR | Status: AC
  Filled 2023-02-02: qty 20

## 2023-02-02 MED ORDER — LIDOCAINE HCL 1 % IJ SOLN
20.0000 mL | Freq: Once | INTRAMUSCULAR | Status: AC
  Administered 2023-02-02: 10 mL

## 2023-02-03 ENCOUNTER — Telehealth: Payer: Self-pay | Admitting: Genetic Counselor

## 2023-02-03 NOTE — Telephone Encounter (Signed)
Left patient a message regarding scheduled appointment times/dates for Genetics Counselor; left callback if needed for rescheduling

## 2023-02-07 ENCOUNTER — Telehealth: Payer: Self-pay | Admitting: Medical Oncology

## 2023-02-07 NOTE — Telephone Encounter (Signed)
Genetic counselor appt-on 11/11 pt understood the appt was video visit and she does not understand why she has a lab appt if this is a video visit. Message sent to Genetics.

## 2023-02-08 ENCOUNTER — Ambulatory Visit: Payer: Medicare Other | Admitting: Internal Medicine

## 2023-02-13 ENCOUNTER — Telehealth: Payer: Self-pay | Admitting: Medical Oncology

## 2023-02-13 ENCOUNTER — Telehealth: Payer: Self-pay | Admitting: Pharmacist

## 2023-02-13 ENCOUNTER — Other Ambulatory Visit: Payer: Self-pay | Admitting: Genetic Counselor

## 2023-02-13 DIAGNOSIS — C3492 Malignant neoplasm of unspecified part of left bronchus or lung: Secondary | ICD-10-CM

## 2023-02-13 NOTE — Telephone Encounter (Signed)
She will call [facility] to see if they can see her at home.

## 2023-02-13 NOTE — Telephone Encounter (Signed)
Oral Chemotherapy Pharmacist Encounter   Received phone call from patient regarding questions about her Gilotrif.  Patient started Gilotrif on 02/06/23, and stated she has experienced diarrhea while on Gilotrif, but his has been controlled with Imodium. Patient told she can utilize Imodium PRN while on Gilotrif to help with diarrhea management and that this side effect should not last the entirety of her being on this medication.   Patient additionally inquired about what antibiotics she could take with Gilotrif, as she has a significant allergy list. I asked patient if she was currently on any antibiotics, which she denied. I informed her IF there was a scenario in which she was being prescribed antibiotics, we would help ensure no major drug-drug interactions existed for patient. Patient expressed understanding.   Additionally, patient inquired about if she was OK to utilize saline eye drops for her eyes at nighttime as well as pepto bismol PRN for indigestion. Informed patient that neither of these medications interfere with her Gilotrif and they are OK to utilize. Patient expressed understanding.   No other questions or concerns at this time. Patient expressed appreciation and understanding of the above. Patient has follow up scheduled with Dr. Arbutus Ped on 02/21/23.  Lenord Carbo, PharmD, BCPS, BCOP Hematology/Oncology Clinical Pharmacist [facility] and [facility] Oral Chemotherapy Navigation Clinics [phone] 02/13/2023 10:01 AM

## 2023-02-21 ENCOUNTER — Inpatient Hospital Stay: Payer: Medicare Other | Attending: Internal Medicine

## 2023-02-21 ENCOUNTER — Inpatient Hospital Stay (HOSPITAL_BASED_OUTPATIENT_CLINIC_OR_DEPARTMENT_OTHER): Payer: Medicare Other | Admitting: Internal Medicine

## 2023-02-21 VITALS — BP 160/59 | HR 81 | Temp 98.0°F | Resp 16 | Ht 64.75 in | Wt 129.2 lb

## 2023-02-21 DIAGNOSIS — C3490 Malignant neoplasm of unspecified part of unspecified bronchus or lung: Secondary | ICD-10-CM | POA: Diagnosis not present

## 2023-02-21 DIAGNOSIS — J90 Pleural effusion, not elsewhere classified: Secondary | ICD-10-CM | POA: Insufficient documentation

## 2023-02-21 DIAGNOSIS — R040 Epistaxis: Secondary | ICD-10-CM | POA: Diagnosis not present

## 2023-02-21 DIAGNOSIS — R634 Abnormal weight loss: Secondary | ICD-10-CM | POA: Insufficient documentation

## 2023-02-21 DIAGNOSIS — C3432 Malignant neoplasm of lower lobe, left bronchus or lung: Secondary | ICD-10-CM | POA: Insufficient documentation

## 2023-02-21 DIAGNOSIS — R21 Rash and other nonspecific skin eruption: Secondary | ICD-10-CM | POA: Diagnosis not present

## 2023-02-21 DIAGNOSIS — D649 Anemia, unspecified: Secondary | ICD-10-CM | POA: Insufficient documentation

## 2023-02-21 DIAGNOSIS — Z902 Acquired absence of lung [part of]: Secondary | ICD-10-CM | POA: Diagnosis not present

## 2023-02-21 DIAGNOSIS — R197 Diarrhea, unspecified: Secondary | ICD-10-CM | POA: Diagnosis not present

## 2023-02-21 DIAGNOSIS — C3492 Malignant neoplasm of unspecified part of left bronchus or lung: Secondary | ICD-10-CM

## 2023-02-21 LAB — CMP (CANCER CENTER ONLY)
ALT: 11 U/L (ref 0–44)
AST: 19 U/L (ref 15–41)
Albumin: 4.1 g/dL (ref 3.5–5.0)
Alkaline Phosphatase: 68 U/L (ref 38–126)
Anion gap: 6 (ref 5–15)
BUN: 17 mg/dL (ref 8–23)
CO2: 29 mmol/L (ref 22–32)
Calcium: 9.4 mg/dL (ref 8.9–10.3)
Chloride: 102 mmol/L (ref 98–111)
Creatinine: 0.97 mg/dL (ref 0.44–1.00)
GFR, Estimated: >60 mL/min (ref >60)
Glucose, Bld: 110 mg/dL — ABNORMAL HIGH (ref 70–99)
Potassium: 4 mmol/L (ref 3.5–5.1)
Sodium: 137 mmol/L (ref 135–145)
Total Bilirubin: 0.4 mg/dL (ref <1.2)
Total Protein: 6.7 g/dL (ref 6.5–8.1)

## 2023-02-21 LAB — CBC WITH DIFFERENTIAL (CANCER CENTER ONLY)
Abs Immature Granulocytes: 0.02 10*3/uL (ref 0.00–0.07)
Basophils Absolute: 0 10*3/uL (ref 0.0–0.1)
Basophils Relative: 0 %
Eosinophils Absolute: 0.2 10*3/uL (ref 0.0–0.5)
Eosinophils Relative: 4 %
HCT: 29.4 % — ABNORMAL LOW (ref 36.0–46.0)
Hemoglobin: 9.5 g/dL — ABNORMAL LOW (ref 12.0–15.0)
Immature Granulocytes: 0 %
Lymphocytes Relative: 23 %
Lymphs Abs: 1.4 10*3/uL (ref 0.7–4.0)
MCH: 30.7 pg (ref 26.0–34.0)
MCHC: 32.3 g/dL (ref 30.0–36.0)
MCV: 95.1 fL (ref 80.0–100.0)
Monocytes Absolute: 0.5 10*3/uL (ref 0.1–1.0)
Monocytes Relative: 9 %
Neutro Abs: 3.7 10*3/uL (ref 1.7–7.7)
Neutrophils Relative %: 64 %
Platelet Count: 290 10*3/uL (ref 150–400)
RBC: 3.09 MIL/uL — ABNORMAL LOW (ref 3.87–5.11)
RDW: 13 % (ref 11.5–15.5)
WBC Count: 5.9 10*3/uL (ref 4.0–10.5)
nRBC: 0 % (ref 0.0–0.2)

## 2023-02-21 NOTE — Progress Notes (Signed)
[facility] [phone]   [phone]  OFFICE PROGRESS NOTE  [name], MD [address] [place] [address]  DIAGNOSIS: Recurrent non-small cell lung cancer initially diagnosed as stage IIA (T2b, N0, M0) non-small cell lung cancer, adenocarcinoma diagnosed in November 2019  Molecular studies showed EGFR mutation in exon 21 (L858R) PD-L1 expression 0  Biomarker Findings on disease progression in 02/2022 Microsatellite status - MS-Stable Tumor Mutational Burden - 4 Muts/Mb Genomic Findings For a complete list of the genes assayed, please refer to the Appendix. EGFR L718V, L858R CTNNB1 D32Y SMAD4 E330K 7 Disease relevant genes with no reportable alterations: ALK, BRAF, ERBB2, KRAS, MET, RET, ROS1  Molecular studies by [id number] performed in September 2024 showed similar results  PRIOR THERAPY:  1) status post left lower lobectomy with lymph node dissection on April 25, 2018 under the care of Dr. Dorris Fetch.  She declined adjuvant therapy 2) status post palliative radiotherapy with IMRT to the progressive pleural-based lung masses under the care of Dr. Roselind Messier completed on February 28, 2022. 3) Osimertinib (Tagrisso) 80 mg p.o. daily.  First dose was on June 19, 2019.  Status post 43 months of treatment.  CURRENT THERAPY: Afatinib 30 mg p.o. daily.  First dose February 06, 2023.  INTERVAL HISTORY: [name] 72 y.o. female returns to the clinic today for follow-up visit accompanied by her son [name]. Discussed the use of AI scribe software for clinical note transcription with the patient, who gave verbal consent to proceed.  History of Present Illness   The patient, a 72 year old individual with a history of stage 2A non-small cell lung cancer, initially diagnosed in November 2019, underwent a left lower lobectomy. Following recurrence, the patient received radiation and was started on Tagrisso, which was effective for  approximately 43 months before the cancer began to grow again. Two weeks prior to the current consultation, the patient was switched to afatinib (30mg ).  The patient recently had 600cc of fluid drained from the lung, which initially did not result in immediate symptomatic relief. However, after about a week, the patient reported feeling better, with less coughing and shortness of breath.  The patient reported experiencing diarrhea as a side effect of afatinib, which began on the third day of treatment. The frequency of diarrhea was high during the first couple of days but was managed with regular use of Imodium. The patient reported a pattern of diarrhea occurring every fourth day, which was controlled with Imodium.  The patient also reported experiencing dry, sticky eyes, which she attributed to dehydration. The patient has been using saline eye drops to manage this symptom. Additionally, the patient reported issues with her nails, which were cracking and brittle, similar to her experience while on Tagrisso.  The patient also reported dry nasal passages and occasional nosebleeds in the morning, which she has been managing with saline nasal drops. The patient has not experienced any significant skin rash since starting afatinib, although she reported a few itchy spots during the first week of treatment.  The patient has lost some weight, which she attributes to a decrease in appetite since starting afatinib. The patient's family is considering arranging for home health care to monitor the patient's hydration and vital signs while she is on this medication.       MEDICAL HISTORY: Past Medical History:  Diagnosis Date   Anemia    Atherosclerosis    GERD (gastroesophageal reflux disease)    Hemoptysis  History of radiation therapy    Left Lung- 02/15/22-02/28/22- Dr. Antony Blackbird   Hyperlipidemia    Hypertension    IBS (irritable bowel syndrome)    Lung mass    Migraines    Spondylosis     Vitamin D deficiency     ALLERGIES:  is allergic to avocado, influenza vaccines, lisinopril, metoprolol, nexium [esomeprazole magnesium], penicillins, pork-derived products, proton pump inhibitors, sulfites, wasp venom protein, zantac [ranitidine hcl], cefaclor, chlorhexidine, chloroxine, ciprofloxacin hcl, clarithromycin, gatifloxacin, other, sulfa antibiotics, sulfamethoxazole-trimethoprim, buckwheat, cat hair extract, dog epithelium (canis lupus familiaris), egg-derived products, fluogen [influenza virus vaccine], pollen extract, short ragweed pollen ext, sulfur, aspartame, chocolate, dilaudid [hydromorphone hcl], fentanyl, ivp dye [iodinated contrast media], ketorolac, milk-related compounds, molds & smuts, oxycodone, prednisolone, shellfish allergy, soap, and tilactase.  MEDICATIONS:  Current Outpatient Medications  Medication Sig Dispense Refill   afatinib dimaleate (GILOTRIF) 30 MG tablet Take 1 tablet (30 mg total) by mouth daily. Take on an empty stomach 1hr before or 2hrs after meals. 30 tablet 2   Carboxymethylcellul-Glycerin (LUBRICATING EYE DROPS OP) Place 1 drop into both eyes daily as needed (dry eyes).     Cholecalciferol (VITAMIN D3) LIQD Take 2,000 Units by mouth daily.     Cyanocobalamin (VITAMIN B-12) 1000 MCG SUBL Place 1 tablet under the tongue once a week.     diltiazem (CARDIZEM CD) 120 MG 24 hr capsule Take 120 mg by mouth daily.     EPINEPHrine 0.3 mg/0.3 mL IJ SOAJ injection Inject 0.3 mg into the muscle as needed for anaphylaxis.     famotidine (PEPCID) 40 MG tablet Take 40 mg by mouth daily.     LORazepam (ATIVAN) 0.5 MG tablet 1 tablet p.o. 15-30 minutes before thoracentesis, repeat x 1 if needed 2 tablet 0   Multiple Vitamin (MULTIVITAMIN) tablet Take 1 tablet by mouth daily.     Omega 3-6-9 Fatty Acids (OMEGA-3 FUSION) LIQD Take by mouth.     OVER THE COUNTER MEDICATION Iron gummies.     PRESCRIPTION MEDICATION Apply 1 application topically 2 (two) times daily as  needed for itching. Dr Dallas Schimke 2019     rizatriptan (MAXALT) 10 MG tablet Take 5 mg by mouth as needed for migraine. May repeat in 2 hours if needed (Patient not taking: Reported on 02/03/2022)     No current facility-administered medications for this visit.    SURGICAL HISTORY:  Past Surgical History:  Procedure Laterality Date   BROW LIFT AND BLEPHAROPLASTY  03/15/2013   CATARACT EXTRACTION, BILATERAL  06/23/2014   CHEST TUBE INSERTION Left 05/10/2018   Procedure: CHEST TUBE INSERTION;  Surgeon: Loreli Slot, MD;  Location[facility];  Service: Thoracic;  Laterality: Left;   COLONOSCOPY  05/2014   ESOPHAGOGASTRODUODENOSCOPY  2019   EYE SURGERY     IR THORACENTESIS ASP PLEURAL SPACE W/IMG GUIDE  05/24/2018   IR THORACENTESIS ASP PLEURAL SPACE W/IMG GUIDE  06/14/2018   IR THORACENTESIS ASP PLEURAL SPACE W/IMG GUIDE  02/02/2023   TONSILLECTOMY     VIDEO ASSISTED THORACOSCOPY (VATS)/ LOBECTOMY Left 04/25/2018   Procedure: VIDEO ASSISTED THORACOSCOPY (VATS)LEFT LOWER LOBECTOMY, NODE DISSECTION;  Surgeon: Loreli Slot, MD;  Location: [facility] OR;  Service: Thoracic;  Laterality: Left;   VIDEO BRONCHOSCOPY WITH ENDOBRONCHIAL NAVIGATION N/A 03/07/2018   Procedure: VIDEO BRONCHOSCOPY WITH ENDOBRONCHIAL NAVIGATION;  Surgeon: Leslye Peer, MD;  Location: [facility] OR;  Service: Thoracic;  Laterality: N/A;   VIDEO BRONCHOSCOPY WITH INSERTION OF INTERBRONCHIAL VALVE (IBV) N/A 05/10/2018   Procedure:  VIDEO BRONCHOSCOPY WITH INSERTION OF INTERBRONCHIAL VALVE (IBV);  Surgeon: Loreli Slot, MD;  Location: [facility] OR;  Service: Thoracic;  Laterality: N/A;   VIDEO BRONCHOSCOPY WITH INSERTION OF INTERBRONCHIAL VALVE (IBV) N/A 06/13/2018   Procedure: VIDEO BRONCHOSCOPY WITH REMOVAL OF INTERBRONCHIAL VALVE (IBV) x 2;  Surgeon: Loreli Slot, MD;  Location: [facility] OR;  Service: Thoracic;  Laterality: N/A;    REVIEW OF SYSTEMS:  Constitutional: positive for fatigue Eyes: negative Ears, nose, mouth,  throat, and face: negative Respiratory: negative Cardiovascular: negative Gastrointestinal: positive for diarrhea and nausea Genitourinary:negative Integument/breast: negative Hematologic/lymphatic: negative Musculoskeletal:negative Neurological: negative Behavioral/Psych: negative Endocrine: negative Allergic/Immunologic: negative   PHYSICAL EXAMINATION: General appearance: alert, cooperative, fatigued, and no distress Head: Normocephalic, without obvious abnormality, atraumatic Neck: no adenopathy, no JVD, supple, symmetrical, trachea midline, and thyroid not enlarged, symmetric, no tenderness/mass/nodules Lymph nodes: Cervical, supraclavicular, and axillary nodes normal. Resp: diminished breath sounds LLL and dullness to percussion LLL Back: symmetric, no curvature. ROM normal. No CVA tenderness. Cardio: regular rate and rhythm, S1, S2 normal, no murmur, click, rub or gallop GI: soft, non-tender; bowel sounds normal; no masses,  no organomegaly Extremities: extremities normal, atraumatic, no cyanosis or edema Neurologic: Alert and oriented X 3, normal strength and tone. Normal symmetric reflexes. Normal coordination and gait   ECOG PERFORMANCE STATUS: 1 - Symptomatic but completely ambulatory  Blood pressure (!) 160/59, pulse 81, temperature 98 F (36.7 C), temperature source Oral, resp. rate 16, height 5' 4.75" (1.645 m), weight 129 lb 3.2 oz (58.6 kg), SpO2 100%.   LABORATORY DATA: Lab Results  Component Value Date   WBC 5.9 02/21/2023   HGB 9.5 (L) 02/21/2023   HCT 29.4 (L) 02/21/2023   MCV 95.1 02/21/2023   PLT 290 02/21/2023      Chemistry      Component Value Date/Time   NA 137 02/21/2023 1026   K 4.0 02/21/2023 1026   CL 102 02/21/2023 1026   CO2 29 02/21/2023 1026   BUN 17 02/21/2023 1026   CREATININE 0.97 02/21/2023 1026      Component Value Date/Time   CALCIUM 9.4 02/21/2023 1026   ALKPHOS 68 02/21/2023 1026   AST 19 02/21/2023 1026   ALT 11  02/21/2023 1026   BILITOT 0.4 02/21/2023 1026       RADIOGRAPHIC STUDIES: DG Chest 1 View  Result Date: 02/02/2023 CLINICAL DATA:  Provided history: Status post thoracentesis. Additional history obtained from radiology records: Non-small cell lung cancer with known pleural disease. EXAM: CHEST  1 VIEW COMPARISON:  Chest CT 01/30/2023. PET CT 11/28/2022. Prior chest radiographs 09/04/2018 and earlier. FINDINGS: Heart size within normal limits. Aortic atherosclerosis. Small residual left pleural effusion and/or pleural thickening status post left thoracentesis. No evidence of pneumothorax. Superimposed ill-defined and bandlike opacities within the mid and lower left lung, which may reflect atelectasis and/or airspace consolidation. No acute airspace consolidation on the right. Spondylosis and levocurvature of the thoracic spine. IMPRESSION: 1. No evidence of pneumothorax status post left thoracentesis. 2. Small residual left pleural effusion and/or pleural thickening. 3. Superimposed ill-defined and bandlike opacities within the mid and lower left lung, which may reflect atelectasis and/or airspace consolidation. 4. Please refer to the recent prior chest CT of 01/30/2023 for additional findings related to the patient's known pulmonary malignancy. 5. Aortic Atherosclerosis (ICD10-I70.0). Electronically Signed   By: Jackey Loge D.O.   On: 02/02/2023 12:47   IR THORACENTESIS ASP PLEURAL SPACE W/IMG GUIDE  Result Date: 02/02/2023 INDICATION: Recurrent non-small cell lung  cancer with associated left pleural effusion. Prior history of VATS/partial lobectomy and radiation therapy in 2020. Request for therapeutic thoracentesis. EXAM: ULTRASOUND GUIDED LEFT THORACENTESIS MEDICATIONS: 1% plain lidocaine, 6 mL COMPLICATIONS: None immediate. Postprocedural chest x-ray negative for pneumothorax. PROCEDURE: An ultrasound guided thoracentesis was thoroughly discussed with the patient and questions answered. The  benefits, risks, alternatives and complications were also discussed. The patient understands and wishes to proceed with the procedure. Written consent was obtained. Ultrasound was performed to localize and mark an adequate pocket of fluid in the left chest. The area was then prepped and draped in the normal sterile fashion. 1% Lidocaine was used for local anesthesia. Under ultrasound guidance a 6 Fr Safe-T-Centesis catheter was introduced. Thoracentesis was performed. Patient did experience some left-sided chest discomfort and requested the procedure be terminated at that point. The catheter was removed and a dressing applied. FINDINGS: A total of approximately 600 mL of thin, dark serosanguineous fluid was removed. IMPRESSION: Successful ultrasound guided left thoracentesis yielding 600 mL of pleural fluid. Procedure performed by Brayton El PA-C and supervised by Dr. Gilmer Mor Electronically Signed   By: Gilmer Mor D.O.   On: 02/02/2023 11:33   CT Chest Wo Contrast  Result Date: 01/30/2023 CLINICAL DATA:  Non-small-cell lung cancer. Restaging. * Tracking Code: BO * EXAM: CT CHEST WITHOUT CONTRAST TECHNIQUE: Multidetector CT imaging of the chest was performed following the standard protocol without IV contrast. RADIATION DOSE REDUCTION: This exam was performed according to the departmental dose-optimization program which includes automated exposure control, adjustment of the mA and/or kV according to patient size and/or use of iterative reconstruction technique. COMPARISON:  PET-CT 11/28/2022.  Chest CT 11/01/2022. FINDINGS: Cardiovascular: The heart size is normal. No substantial pericardial effusion. Coronary artery calcification is evident. Mild atherosclerotic calcification is noted in the wall of the thoracic aorta. Mediastinum/Nodes: No mediastinal lymphadenopathy. No evidence for gross hilar lymphadenopathy although assessment is limited by the lack of intravenous contrast on the current study.  The esophagus has normal imaging features. There is no axillary lymphadenopathy. Lungs/Pleura: Surgical changes and volume loss again noted left hemithorax. No new pulmonary parenchymal nodule within either lung. Left pleural effusion has increased in the interval, now moderate to large in size. There is mild adjacent collapse/consolidation in the adjacent left lower lung compatible with atelectasis. Left-sided pleural nodularity again noted. Index high left lateral apical pleural nodule deep to the left third rib measured previously at 10 mm is partially obscured by pleural fluid today but measures approximately 10 mm again on image 25/2. 7 mm posterolateral pleural nodule measured previously is 7 mm again today on 34/2. A third index left lateral pleural nodule measured previously at 7 mm is obscured by pleural fluid on today's study. 9 mm anterior pleural nodule measured previously is 11 mm today on 55/2 and has become confluent with an additional smaller nodule seen on the previous study, generating a plaque-like lesion measuring on the order of 24 x 5 mm on 54/2. 8 mm nodule seen previously in the anterior mediastinum versus medial pleural reflection is 14 mm today (image 81/2) and has become confluent with the adjacent 7 mm nodule seen previously generating a plaque-like appearance measuring on the order of 25 x 8 mm (81/2). 9 mm nodule along the anterior left base measured previously is 14 mm today on image 101/2. 22 x 9 mm nodule anterior to the heart on 113/2, not reported previously but described today for comparison purposes has increased from 14 x 7  mm previously. Probable 22 mm pleural base nodule posterior left costophrenic sulcus (image 122/2) more conspicuous today, possibly due to the increase in pleural fluid. Additional nodular foci of soft tissue in the anterior lower left chest, likely along the pleural reflection or lobe mediastinum show similar mild progression as the above described index  nodules. Upper Abdomen: Visualized portion of the upper abdomen is unremarkable. Small soft tissue nodule seen in the region of the GE junction and described on prior studies are stable in the interval. Musculoskeletal: No worrisome lytic or sclerotic osseous abnormality. IMPRESSION: 1. Overall generalized trend of continued interval progression of known pleural disease in the left hemithorax and left anteromedial pleural reflection versus anterior mediastinum. 2. Interval increase in left pleural effusion, now moderate to large in size with adjacent mild atelectasis in the lower left lung. 3. Stable small soft tissue nodules in the region of the GE junction. 4.  Aortic Atherosclerosis (ICD10-I70.0). Electronically Signed   By: Kennith Center M.D.   On: 01/30/2023 13:10    ASSESSMENT AND PLAN: This is a very pleasant 72 years old white female with a stage IIa non-small cell lung cancer, adenocarcinoma with positive EGFR mutation in exon 21 (L858R) status post left lower lobectomy with lymph node dissection under the care of Dr. Dorris Fetch. She declined to proceed with adjuvant systemic chemotherapy or targeted therapy. The patient was on observation since her surgical resection until she had disease recurrence in February 2021 presented with pleural-based masses in the left lung with new biopsy and molecular studies positive for EGFR mutation exon 21 (L858R). The patient started treatment with Tagrisso 80 mg p.o. daily status post almost 43 months of treatment.  The patient underwent CT-guided core biopsy of one of the pleural-based left lung progressive lesion and the final pathology was consistent with metastatic adenocarcinoma. The molecular studies by [facility] and it was positive for EGFR L718V, L858R.  She has negative PD-L1 expression.  She also had molecular studies by [employer] performed recently and it showed similar results with many other none actionable mutations in lung cancer. The patient  was seen by Dr. Roselind Messier and underwent IMRT to the progressive lesion of the left lung pleural-based masses.   She continued her treatment with Tagrisso 80 mg p.o. daily and she has been tolerating the treatment well except for the fatigue. She had evidence for disease progression on imaging studies in October 2024 this overall generalized trend of continued interval progression of her known pleural disease in the left hemithorax and left anterior medial pleural reflection versus anterior mediastinum with interval increase in the left pleural effusion that is now moderate to large in size with adjacent mild atelectasis in the lower left lung and stable soft tissue nodules in the region of the GE junction. At that time he had a lengthy discussion with the patient about her condition. I discussed with the patient several options for management of her condition including continued treatment with Tagrisso but adding systemic chemotherapy with carboplatin and Alimta versus discontinuing treatment with Tagrisso and switching her to afatinib which has activity in patient with the uncommon mutation in EGFR L718V in addition to the common L858R mutation versus discontinuing treatment with Tagrisso and starting the patient on treatment with systemic chemotherapy in addition to Amivantamab but this is usually associated with significant hypersensitivity reaction especially during the first 2 doses. The patient was also given the option of revisiting Dr. Allena Katz at [facility] for discussion of the clinical trial  with the [id number] oral EGFR inhibitor. After a lengthy discussion the patient decided to proceed with treatment with afatinib and this will be given at a dose of 30 mg p.o. daily.  She started the first dose of this treatment on February 06, 2023.    Recurrent Non-Small Cell Lung Cancer Patient was initially diagnosed in November 2019, underwent left lower lobectomy, and had recurrence. She was on Tagrisso for  43 months before disease progression. Recently switched to Afatinib 30mg  daily two weeks ago. -Continue Afatinib 30mg  daily. -Plan for imaging in 6 weeks to assess response to Afatinib.  Afatinib Side Effects Patient experiencing diarrhea, dry eyes, and nail changes since starting Afatinib. -Continue prophylactic use of Imodium for diarrhea. -Continue saline eye drops for dry eyes. -Consider vinegar water soaks if nail changes worsen.  Pleural Effusion Patient had 600cc of fluid drained from lung recently, with improvement in symptoms after one week. -No immediate intervention needed. Monitor symptoms.  Genetic Testing Patient has been offered additional genetic testing. -Encourage patient to discuss with genetic counselor to determine necessity and timing of testing.  General Health Maintenance / Followup Plans -Plan for follow-up visit in 2 weeks with repeat blood work. -Schedule imaging for second week of December, prior to planned travel. -Consider referral to [facility] for potential clinical trials if disease progresses and other treatment options are exhausted.   Patient was advised to call immediately if she has any concerning symptoms in the interval. The patient voices understanding of current disease status and treatment options and is in agreement with the current care plan. All questions were answered. The patient knows to call the clinic with any problems, questions or concerns. We can certainly see the patient much sooner if necessary. The total time spent in the appointment was 35 minutes.  Disclaimer: This note was dictated with voice recognition software. Similar sounding words can inadvertently be transcribed and may not be corrected upon review.

## 2023-02-22 ENCOUNTER — Other Ambulatory Visit (HOSPITAL_COMMUNITY): Payer: Self-pay

## 2023-02-23 ENCOUNTER — Telehealth: Payer: Self-pay | Admitting: Medical Oncology

## 2023-02-23 ENCOUNTER — Encounter: Payer: Self-pay | Admitting: Genetic Counselor

## 2023-02-23 ENCOUNTER — Other Ambulatory Visit: Payer: Self-pay | Admitting: Medical Oncology

## 2023-02-23 DIAGNOSIS — C3492 Malignant neoplasm of unspecified part of left bronchus or lung: Secondary | ICD-10-CM

## 2023-02-23 NOTE — Progress Notes (Signed)
Referred to [facility] -records faxed.

## 2023-02-23 NOTE — Telephone Encounter (Signed)
Pt referred to [facility].Records faxed/

## 2023-02-24 ENCOUNTER — Other Ambulatory Visit: Payer: Self-pay

## 2023-02-24 NOTE — Progress Notes (Signed)
Specialty Pharmacy Ongoing Clinical Assessment Note  [name] is a 72 y.o. female who is being followed by the specialty pharmacy service for RxSp Oncology   Patient's specialty medication(s) reviewed today: Afatinib Dimaleate   Missed doses in the last 4 weeks: 0   Patient/Caregiver did not have any additional questions or concerns.   Therapeutic benefit summary: Unable to assess   Adverse events/side effects summary: Experienced adverse events/side effects (Pt reports she has tolerable side effects.)   Patient's therapy is appropriate to: Continue    Goals Addressed             This Visit's Progress    Stabilization of disease       Patient is on track. Patient will maintain adherence         Follow up:  6 months  Bobette Mo Specialty Pharmacist

## 2023-02-24 NOTE — Progress Notes (Signed)
Specialty Pharmacy Refill Coordination Note  Lyn NINNIE PERKOWSKI is a 72 y.o. female contacted today regarding refills of specialty medication(s) Afatinib Dimaleate   Patient requested Delivery   Delivery date: 03/01/23   Verified address: [address]   Medication will be filled on 02/28/23.

## 2023-02-28 ENCOUNTER — Other Ambulatory Visit: Payer: Medicare Other

## 2023-02-28 ENCOUNTER — Inpatient Hospital Stay (HOSPITAL_BASED_OUTPATIENT_CLINIC_OR_DEPARTMENT_OTHER): Payer: Medicare Other | Admitting: Genetic Counselor

## 2023-02-28 ENCOUNTER — Encounter: Payer: Self-pay | Admitting: Genetic Counselor

## 2023-02-28 DIAGNOSIS — C3492 Malignant neoplasm of unspecified part of left bronchus or lung: Secondary | ICD-10-CM

## 2023-02-28 DIAGNOSIS — Z801 Family history of malignant neoplasm of trachea, bronchus and lung: Secondary | ICD-10-CM | POA: Insufficient documentation

## 2023-02-28 NOTE — Progress Notes (Addendum)
REFERRING PROVIDER: [name], MD [address] [place],  [place] [address]  PRIMARY PROVIDER:  [name], MD  PRIMARY REASON FOR VISIT:  1. Family history of lung cancer   2. Adenocarcinoma of left lung (HCC)      HISTORY OF PRESENT ILLNESS:  I connected with  [name] on 02/28/2023 at 11 AM EDT by MyChart video conference and verified that I am speaking with the correct person using two identifiers.   Patient location: Home Provider location: [facility]  [name], a 72 y.o. female, was seen for a [place] cancer genetics consultation at the request of Dr. Arbutus Ped due to a personal and family history of lung cancer and Ashkenazi [place].  [name] presents to clinic today to discuss the possibility of a hereditary predisposition to cancer, genetic testing, and to further clarify her future cancer risks, as well as potential cancer risks for family members.   In 2019, at the age of 18, [name] was diagnosed with adenocarcinoma of the lung.     CANCER HISTORY:  Oncology History  Adenocarcinoma, lung (HCC)  03/01/2018 Initial Diagnosis   Adenocarcinoma, lung (HCC)   04/28/2020 Cancer Staging   Staging form: Lung, AJCC 8th Edition - Clinical: Stage IVA (cT2a, cN0, cM1a) - Signed by Si Gaul, MD on 04/28/2020     Past Medical History:  Diagnosis Date   Anemia    Atherosclerosis    Family history of lung cancer    GERD (gastroesophageal reflux disease)    Hemoptysis    History of radiation therapy    Left Lung- 02/15/22-02/28/22- Dr. Antony Blackbird   Hyperlipidemia    Hypertension    IBS (irritable bowel syndrome)    Lung mass    Migraines    Spondylosis    Vitamin D deficiency     Past Surgical History:  Procedure Laterality Date   BROW LIFT AND BLEPHAROPLASTY  03/15/2013   CATARACT EXTRACTION, BILATERAL  06/23/2014   CHEST TUBE INSERTION Left 05/10/2018   Procedure: CHEST TUBE INSERTION;  Surgeon: Loreli Slot, MD;   Location: [facility] OR;  Service: Thoracic;  Laterality: Left;   COLONOSCOPY  05/2014   ESOPHAGOGASTRODUODENOSCOPY  2019   EYE SURGERY     IR THORACENTESIS ASP PLEURAL SPACE W/IMG GUIDE  05/24/2018   IR THORACENTESIS ASP PLEURAL SPACE W/IMG GUIDE  06/14/2018   IR THORACENTESIS ASP PLEURAL SPACE W/IMG GUIDE  02/02/2023   TONSILLECTOMY     VIDEO ASSISTED THORACOSCOPY (VATS)/ LOBECTOMY Left 04/25/2018   Procedure: VIDEO ASSISTED THORACOSCOPY (VATS)LEFT LOWER LOBECTOMY, NODE DISSECTION;  Surgeon: Loreli Slot, MD;  Location: [facility] OR;  Service: Thoracic;  Laterality: Left;   VIDEO BRONCHOSCOPY WITH ENDOBRONCHIAL NAVIGATION N/A 03/07/2018   Procedure: VIDEO BRONCHOSCOPY WITH ENDOBRONCHIAL NAVIGATION;  Surgeon: Leslye Peer, MD;  Location: [facility] OR;  Service: Thoracic;  Laterality: N/A;   VIDEO BRONCHOSCOPY WITH INSERTION OF INTERBRONCHIAL VALVE (IBV) N/A 05/10/2018   Procedure: VIDEO BRONCHOSCOPY WITH INSERTION OF INTERBRONCHIAL VALVE (IBV);  Surgeon: Loreli Slot, MD;  Location: [facility];  Service: Thoracic;  Laterality: N/A;   VIDEO BRONCHOSCOPY WITH INSERTION OF INTERBRONCHIAL VALVE (IBV) N/A 06/13/2018   Procedure: VIDEO BRONCHOSCOPY WITH REMOVAL OF INTERBRONCHIAL VALVE (IBV) x 2;  Surgeon: Loreli Slot, MD;  Location: [facility] OR;  Service: Thoracic;  Laterality: N/A;    Social History   Socioeconomic History   Marital status: Widowed    Spouse name: Not on file   Number of  children: Not on file   Years of education: Not on file   Highest education level: Not on file  Occupational History   Not on file  Tobacco Use   Smoking status: Never   Smokeless tobacco: Never  Vaping Use   Vaping status: Never Used  Substance and Sexual Activity   Alcohol use: Not on file   Drug use: Never   Sexual activity: Not on file  Other Topics Concern   Not on file  Social History Narrative   Not on file   Social Determinants of Health   Financial Resource Strain: Not on file  Food  Insecurity: Not on file  Transportation Needs: Not on file  Physical Activity: Not on file  Stress: Not on file  Social Connections: Not on file     FAMILY HISTORY:  We obtained a detailed, 4-generation family history.  Significant diagnoses are listed below: Family History  Problem Relation Age of Onset   Dementia Mother    Hyperlipidemia Mother    Hypertension Mother    Thyroid disease Mother    Irritable bowel syndrome Mother    Aortic stenosis Mother    Lung cancer Father 60   Lung cancer Sister 48   Hypertension Maternal Grandmother    Cirrhosis Maternal Grandfather      The patient has two sons who are cancer free.  She has one sister who had lung cancer at 83.  Her parents are both deceased.  The patient's father died of lung cancer at 62.  He had three brothers, two who died in the Holocaust.  His parents also died in the Holocaust.  The patient's mother died at 81 cancer free.  She had three brothers who did not have cancer. Her parents also died cancer free.  Ms. Delmonico is unaware of previous family history of genetic testing for hereditary cancer risks. Patient's maternal ancestors are of [place] descent, and paternal ancestors are of [place] descent. There is reported Ashkenazi Jewish ancestry. There is no known consanguinity.  GENETIC COUNSELING ASSESSMENT: [name] is a 72 y.o. female with a personal and family history of lung cancer. We, therefore, discussed and recommended the following at today's visit.   DISCUSSION: We discussed that, in general, most cancer is not inherited in families, but instead is sporadic or familial. Sporadic cancers occur by chance and typically happen at older ages (>50 years) as this type of cancer is caused by genetic changes acquired during an individual's lifetime. Some families have more cancers than would be expected by chance; however, the ages or types of cancer are not consistent with a known genetic mutation or known genetic  mutations have been ruled out. This type of familial cancer is thought to be due to a combination of multiple genetic, environmental, hormonal, and lifestyle factors. While this combination of factors likely increases the risk of cancer, the exact source of this risk is not currently identifiable or testable.  We discussed that recent studies have shown between 14 - 15% of lung cancer is hereditary.  According to Sorscher et al. 2023, mutations in patients with only a personal history of lung cancer occurred most commonly in BRCA2, CHEK2, ATM, TP53, BRCA1, EGFR, APC and PALB2.  We discussed that most of these genes are associated with other cancers, which we are not seeing in Ms. Fiser's family.  Her [employer] and [facility] reports both had two different mutations in EGFR.  However, neither mutation was the T790M mutation that is associated  with approximately 1% of hereditary lung cancer syndromes.    We reviewed the difference between hereditary genetic testing and somatic testing (performed by [facility] and [employer]). Hereditary mutations that increase the risk for cancer are uncommon in the general population. Of those families who meet the national criteria for testing, about 13-15% of them will have a hereditary mutation. Somatic mutations in the tumor are common and are typically confined only to the tumor and are used to target treatment options.  Sometimes, mutations in the tumor are also germline, and therefore are hereditary. When reviewing Ms. [employer] [facility] and [place] testing, there were not any mutations reported that we are concerned about being hereditary.  Ms. Pancake reports a family ancestry of Ashkenazi Jewish Ancestry.  We reviewed that there are founder mutations in several genes, including BRCA1, BRCA2 and APC that can increase the risk for cancer (and are ones in the above paper).  National guidelines suggest testing families of Ashkenazi Jewish ancestry based  on the increased chance of finding hereditary mutations within these genes.  We discussed that testing is beneficial for several reasons including knowing how to follow individuals after completing their treatment, and understand if other family members could be at risk for cancer and allow them to undergo genetic testing.   We reviewed the characteristics, features and inheritance patterns of hereditary cancer syndromes. We also discussed genetic testing, including the appropriate family members to test, the process of testing, insurance coverage and turn-around-time for results. We discussed the implications of a negative, positive, carrier and/or variant of uncertain significant result. Ms. Deshay  was offered a common hereditary cancer panel (40+ genes) and an expanded pan-cancer panel (76+ genes). Ms. Waag was informed of the benefits and limitations of each panel, including that expanded pan-cancer panels contain genes that do not have clear management guidelines at this point in time.  We also discussed that as the number of genes included on a panel increases, the chances of variants of uncertain significance increases. Ms. Curatola decided to pursue genetic testing for the CancerNext-Expanded+RNAinsight gene panel.   The CustomNext-Expanded gene panel offered by [facility] and includes sequencing and rearrangement analysis for the following 90 genes: AIP, ALK, APC, ATM, AXIN2, BAP1, BARD1, BMPR1A, BRCA1, BRCA2, BRIP1, CDC73, CDH1, CDK4, CDKN1B, CDKN2A, CEBPA, CHEK2, CTNNA1, DDX41, DICER1, EGFR, EPCAM, ETV6, FH, FLCN, GATA2, GREM1, HOXB13, KIT, LZTR1, MAX, MBD4, MEN1, MET, MITF, MLH1, MSH2, MSH3, MSH6, MUTYH, NF1, NF2, NTHL1, PALB2, PDGFRA, PHOX2B, PMS2, POLD1, POLE, POT1, PRKAR1A, PTCH1, PTEN, RAD51C, RAD51D, RB1, RET, RUNX1, SDHA, SDHAF2, SDHB, SDHC, SDHD, SMAD4, SMARCA4, SMARCB1, SMARCE1, STK11, SUFU, TMEM127, TP53, TSC1, TSC2, VHL, WT1, ATRIP, MLH3, RPS20, PALLD, EGLN1, KIF1B, TERT, RNF43, RAD51B,  CFTR, CPA1, CTRC, PRSS1, and SPINK1. RNA data is routinely analyzed for use in variant interpretation for all genes.   Based on Ms. Bixler's personal and family history of lung cancer, Ashkenazi Jewish ancestry and limited family history due to her paternal side of the family being killed in the Holocaust camps, she meets NCCN 2.2025 Genetic/Familial High-Risk Assessment: Breast, Ovarian, Pancreatic and Prostate criteria for genetic testing. Despite that she meets criteria, she may still have an out of pocket cost. We discussed that if her out of pocket cost for testing may be up to $250.   We discussed that some people do not want to undergo genetic testing due to fear of genetic discrimination.  The Genetic Information Nondiscrimination Act (GINA) was signed into federal law in 2008. GINA prohibits health  insurers and most employers from discriminating against individuals based on genetic information (including the results of genetic tests and family history information). According to GINA, health insurance companies cannot consider genetic information to be a preexisting condition, nor can they use it to make decisions regarding coverage or rates. GINA also makes it illegal for most employers to use genetic information in making decisions about hiring, firing, promotion, or terms of employment. It is important to note that GINA does not offer protections for life insurance, disability insurance, or long-term care insurance. GINA does not apply to those in the [employer], those who work for companies with less than 15 employees, and new life insurance or long-term disability insurance policies.  Health status due to a cancer diagnosis is not protected under GINA. More information about GINA can be found by visiting [email or web address].  PLAN: After considering the risks, benefits, and limitations, Ms. Migliaccio provided informed consent to pursue genetic testing.  She will have her blood drawn on March 09, 2023 and  the blood sample will be sent to [facility] for analysis of the CancerNext-Expanded+RNAinsight. Results should be available within approximately 2-3 weeks' time, at which point they will be disclosed by telephone to Ms. Villarin, as will any additional recommendations warranted by these results. Ms. Dilg will receive a summary of her genetic counseling visit and a copy of her results once available. This information will also be available in Epic.   Lastly, we encouraged Ms. Wisener to remain in contact with cancer genetics annually so that we can continuously update the family history and inform her of any changes in cancer genetics and testing that may be of benefit for this family.   Ms. Fero questions were answered to her satisfaction today. Our contact information was provided should additional questions or concerns arise. Thank you for the referral and allowing [place] to share in the care of your patient.   Kalima Saylor P. Lowell Guitar, MS, [facility] Licensed, Patent attorney [email or web address] phone: [phone]  The patient was seen for a total of 60 minutes in face-to-face genetic counseling.  The patient brought her two sons. Drs. Meliton Rattan, and/or Nikolai were available for questions, if needed..    _______________________________________________________________________ For Office Staff:  Number of people involved in session: 3 Was an Intern/ student involved with case: no

## 2023-03-03 ENCOUNTER — Telehealth: Payer: Self-pay | Admitting: Medical Oncology

## 2023-03-03 NOTE — Telephone Encounter (Signed)
Pt given information for [facility] . She will call them to investigate services. She reports rash under her breast . Steroid cream is not helping. She is going to call her dermatologist.

## 2023-03-09 ENCOUNTER — Inpatient Hospital Stay: Payer: Medicare Other | Admitting: Internal Medicine

## 2023-03-09 ENCOUNTER — Inpatient Hospital Stay: Payer: Medicare Other

## 2023-03-09 VITALS — BP 163/72 | HR 79 | Temp 97.7°F | Resp 16 | Ht 64.75 in | Wt 130.0 lb

## 2023-03-09 DIAGNOSIS — C3492 Malignant neoplasm of unspecified part of left bronchus or lung: Secondary | ICD-10-CM

## 2023-03-09 DIAGNOSIS — C3490 Malignant neoplasm of unspecified part of unspecified bronchus or lung: Secondary | ICD-10-CM

## 2023-03-09 DIAGNOSIS — C3432 Malignant neoplasm of lower lobe, left bronchus or lung: Secondary | ICD-10-CM | POA: Diagnosis not present

## 2023-03-09 LAB — CMP (CANCER CENTER ONLY)
ALT: 13 U/L (ref 0–44)
AST: 20 U/L (ref 15–41)
Albumin: 3.9 g/dL (ref 3.5–5.0)
Alkaline Phosphatase: 70 U/L (ref 38–126)
Anion gap: 5 (ref 5–15)
BUN: 15 mg/dL (ref 8–23)
CO2: 30 mmol/L (ref 22–32)
Calcium: 9.2 mg/dL (ref 8.9–10.3)
Chloride: 103 mmol/L (ref 98–111)
Creatinine: 0.78 mg/dL (ref 0.44–1.00)
GFR, Estimated: >60 mL/min (ref >60)
Glucose, Bld: 108 mg/dL — ABNORMAL HIGH (ref 70–99)
Potassium: 3.5 mmol/L (ref 3.5–5.1)
Sodium: 138 mmol/L (ref 135–145)
Total Bilirubin: 0.4 mg/dL (ref <1.2)
Total Protein: 6.2 g/dL — ABNORMAL LOW (ref 6.5–8.1)

## 2023-03-09 LAB — CBC WITH DIFFERENTIAL (CANCER CENTER ONLY)
Abs Immature Granulocytes: 0.03 10*3/uL (ref 0.00–0.07)
Basophils Absolute: 0 10*3/uL (ref 0.0–0.1)
Basophils Relative: 0 %
Eosinophils Absolute: 0.4 10*3/uL (ref 0.0–0.5)
Eosinophils Relative: 5 %
HCT: 27.4 % — ABNORMAL LOW (ref 36.0–46.0)
Hemoglobin: 9.1 g/dL — ABNORMAL LOW (ref 12.0–15.0)
Immature Granulocytes: 0 %
Lymphocytes Relative: 18 %
Lymphs Abs: 1.2 10*3/uL (ref 0.7–4.0)
MCH: 32 pg (ref 26.0–34.0)
MCHC: 33.2 g/dL (ref 30.0–36.0)
MCV: 96.5 fL (ref 80.0–100.0)
Monocytes Absolute: 0.5 10*3/uL (ref 0.1–1.0)
Monocytes Relative: 8 %
Neutro Abs: 4.6 10*3/uL (ref 1.7–7.7)
Neutrophils Relative %: 69 %
Platelet Count: 338 10*3/uL (ref 150–400)
RBC: 2.84 MIL/uL — ABNORMAL LOW (ref 3.87–5.11)
RDW: 13.5 % (ref 11.5–15.5)
WBC Count: 6.7 10*3/uL (ref 4.0–10.5)
nRBC: 0 % (ref 0.0–0.2)

## 2023-03-09 LAB — GENETIC SCREENING ORDER

## 2023-03-09 NOTE — Progress Notes (Signed)
[facility] [phone]   [phone]  OFFICE PROGRESS NOTE  Cheryl Boston, MD [address] [place] [address]  DIAGNOSIS: Recurrent non-small cell lung cancer initially diagnosed as stage IIA (T2b, N0, M0) non-small cell lung cancer, adenocarcinoma diagnosed in November 2019  Molecular studies showed EGFR mutation in exon 21 (L858R) PD-L1 expression 0  Biomarker Findings on disease progression in 02/2022 Microsatellite status - MS-Stable Tumor Mutational Burden - 4 Muts/Mb Genomic Findings For a complete list of the genes assayed, please refer to the Appendix. EGFR L718V, L858R CTNNB1 D32Y SMAD4 E330K 7 Disease relevant genes with no reportable alterations: ALK, BRAF, ERBB2, KRAS, MET, RET, ROS1  Molecular studies by [id number] performed in September 2024 showed similar results  PRIOR THERAPY:  1) status post left lower lobectomy with lymph node dissection on April 25, 2018 under the care of Dr. Dorris Fetch.  She declined adjuvant therapy 2) status post palliative radiotherapy with IMRT to the progressive pleural-based lung masses under the care of Dr. Roselind Messier completed on February 28, 2022. 3) Osimertinib (Tagrisso) 80 mg p.o. daily.  First dose was on June 19, 2019.  Status post 43 months of treatment.  CURRENT THERAPY: Afatinib 30 mg p.o. daily.  First dose February 06, 2023.  INTERVAL HISTORY: [name] 72 y.o. female returns to the clinic today for follow-up visit accompanied by sister in law.Discussed the use of AI scribe software for clinical note transcription with the patient, who gave verbal consent to proceed.  History of Present Illness   Tiffini Hayduk, a 72 year old patient with a history of non-small cell lung cancer, initially diagnosed in November 2019, underwent a left lower lobectomy. The patient was stable until February 2021 when the cancer began growing on the left side of the chest. The patient was  started on Tagrisso (80mg  once daily) for 43 months.  In the past two weeks, the patient developed a fungal rash under the upper breast, which was managed with Lotrimin as per the dermatologist's advice. The patient also reported finger splitting and pain, a side effect previously experienced with Tagrisso. The patient has been managing this with a cream.  The patient experienced diarrhea, which has since improved and is now almost normal. The patient's weight has remained stable. The patient also reported a swollen eye, which is not painful, and daily morning nosebleeds. The patient has been using AYR nasal gel for the nosebleeds.  The patient has been taking iron supplements and reported feeling more energetic since starting the new medication. The patient also reported some residual fluid in the chest after thoracentesis but has seen an improvement in overall functionality.      MEDICAL HISTORY: Past Medical History:  Diagnosis Date   Anemia    Atherosclerosis    Family history of lung cancer    GERD (gastroesophageal reflux disease)    Hemoptysis    History of radiation therapy    Left Lung- 02/15/22-02/28/22- Dr. Antony Blackbird   Hyperlipidemia    Hypertension    IBS (irritable bowel syndrome)    Lung mass    Migraines    Spondylosis    Vitamin D deficiency     ALLERGIES:  is allergic to avocado, influenza vaccines, lisinopril, metoprolol, nexium [esomeprazole magnesium], penicillins, pork-derived products, proton pump inhibitors, sulfites, wasp venom protein, zantac [ranitidine hcl], cefaclor, chlorhexidine, chloroxine, ciprofloxacin hcl, clarithromycin, gatifloxacin, other, sulfa antibiotics, sulfamethoxazole-trimethoprim, ambrosia artemisiifolia (ragweed) skin test, buckwheat, cat hair extract, dog epithelium (  canis lupus familiaris), egg-derived products, fiber, fluogen [influenza virus vaccine], lentil, pollen extract, rumex acetosella (sheep's sorrel), short ragweed pollen ext,  sulfur, aspartame, chocolate, dilaudid [hydromorphone hcl], fentanyl, ivp dye [iodinated contrast media], ketorolac, milk-related compounds, molds & smuts, oxycodone, prednisolone, shellfish allergy, soap, and tilactase.  MEDICATIONS:  Current Outpatient Medications  Medication Sig Dispense Refill   afatinib dimaleate (GILOTRIF) 30 MG tablet Take 1 tablet (30 mg total) by mouth daily. Take on an empty stomach 1hr before or 2hrs after meals. 30 tablet 2   Carboxymethylcellul-Glycerin (LUBRICATING EYE DROPS OP) Place 1 drop into both eyes daily as needed (dry eyes).     Cholecalciferol (VITAMIN D3) LIQD Take 2,000 Units by mouth daily.     Cyanocobalamin (VITAMIN B-12) 1000 MCG SUBL Place 1 tablet under the tongue once a week.     diltiazem (CARDIZEM CD) 120 MG 24 hr capsule Take 120 mg by mouth daily.     EPINEPHrine 0.3 mg/0.3 mL IJ SOAJ injection Inject 0.3 mg into the muscle as needed for anaphylaxis. (Patient not taking: Reported on 02/21/2023)     famotidine (PEPCID) 40 MG tablet Take 40 mg by mouth daily.     LORazepam (ATIVAN) 0.5 MG tablet 1 tablet p.o. 15-30 minutes before thoracentesis, repeat x 1 if needed 2 tablet 0   Multiple Vitamin (MULTIVITAMIN) tablet Take 1 tablet by mouth daily.     Omega 3-6-9 Fatty Acids (OMEGA-3 FUSION) LIQD Take by mouth.     OVER THE COUNTER MEDICATION Iron gummies.     PRESCRIPTION MEDICATION Apply 1 application topically 2 (two) times daily as needed for itching. Dr Dallas Schimke 2019     rizatriptan (MAXALT) 10 MG tablet Take 5 mg by mouth as needed for migraine. May repeat in 2 hours if needed (Patient not taking: Reported on 02/03/2022)     No current facility-administered medications for this visit.    SURGICAL HISTORY:  Past Surgical History:  Procedure Laterality Date   BROW LIFT AND BLEPHAROPLASTY  03/15/2013   CATARACT EXTRACTION, BILATERAL  06/23/2014   CHEST TUBE INSERTION Left 05/10/2018   Procedure: CHEST TUBE INSERTION;  Surgeon:  Loreli Slot, MD;  Location: [facility] OR;  Service: Thoracic;  Laterality: Left;   COLONOSCOPY  05/2014   ESOPHAGOGASTRODUODENOSCOPY  2019   EYE SURGERY     IR THORACENTESIS ASP PLEURAL SPACE W/IMG GUIDE  05/24/2018   IR THORACENTESIS ASP PLEURAL SPACE W/IMG GUIDE  06/14/2018   IR THORACENTESIS ASP PLEURAL SPACE W/IMG GUIDE  02/02/2023   TONSILLECTOMY     VIDEO ASSISTED THORACOSCOPY (VATS)/ LOBECTOMY Left 04/25/2018   Procedure: VIDEO ASSISTED THORACOSCOPY (VATS)LEFT LOWER LOBECTOMY, NODE DISSECTION;  Surgeon: Loreli Slot, MD;  Location: [facility] OR;  Service: Thoracic;  Laterality: Left;   VIDEO BRONCHOSCOPY WITH ENDOBRONCHIAL NAVIGATION N/A 03/07/2018   Procedure: VIDEO BRONCHOSCOPY WITH ENDOBRONCHIAL NAVIGATION;  Surgeon: Leslye Peer, MD;  Location: [facility] OR;  Service: Thoracic;  Laterality: N/A;   VIDEO BRONCHOSCOPY WITH INSERTION OF INTERBRONCHIAL VALVE (IBV) N/A 05/10/2018   Procedure: VIDEO BRONCHOSCOPY WITH INSERTION OF INTERBRONCHIAL VALVE (IBV);  Surgeon: Loreli Slot, MD;  Location: [facility] OR;  Service: Thoracic;  Laterality: N/A;   VIDEO BRONCHOSCOPY WITH INSERTION OF INTERBRONCHIAL VALVE (IBV) N/A 06/13/2018   Procedure: VIDEO BRONCHOSCOPY WITH REMOVAL OF INTERBRONCHIAL VALVE (IBV) x 2;  Surgeon: Loreli Slot, MD;  Location: [facility] OR;  Service: Thoracic;  Laterality: N/A;    REVIEW OF SYSTEMS:  Constitutional: positive for fatigue Eyes: negative Ears, nose, mouth,  throat, and face: negative Respiratory: negative Cardiovascular: negative Gastrointestinal: negative Genitourinary:negative Integument/breast: negative Hematologic/lymphatic: negative Musculoskeletal:negative Neurological: negative Behavioral/Psych: negative Endocrine: negative Allergic/Immunologic: negative   PHYSICAL EXAMINATION: General appearance: alert, cooperative, fatigued, and no distress Head: Normocephalic, without obvious abnormality, atraumatic Neck: no adenopathy, no JVD, supple,  symmetrical, trachea midline, and thyroid not enlarged, symmetric, no tenderness/mass/nodules Lymph nodes: Cervical, supraclavicular, and axillary nodes normal. Resp: clear to auscultation bilaterally Back: symmetric, no curvature. ROM normal. No CVA tenderness. Cardio: regular rate and rhythm, S1, S2 normal, no murmur, click, rub or gallop GI: soft, non-tender; bowel sounds normal; no masses,  no organomegaly Extremities: extremities normal, atraumatic, no cyanosis or edema Neurologic: Alert and oriented X 3, normal strength and tone. Normal symmetric reflexes. Normal coordination and gait   ECOG PERFORMANCE STATUS: 1 - Symptomatic but completely ambulatory  Blood pressure (!) 163/72, pulse 79, temperature 97.7 F (36.5 C), temperature source Temporal, resp. rate 16, height 5' 4.75" (1.645 m), weight 130 lb (59 kg), SpO2 100%.   LABORATORY DATA: Lab Results  Component Value Date   WBC 5.9 02/21/2023   HGB 9.5 (L) 02/21/2023   HCT 29.4 (L) 02/21/2023   MCV 95.1 02/21/2023   PLT 290 02/21/2023      Chemistry      Component Value Date/Time   NA 137 02/21/2023 1026   K 4.0 02/21/2023 1026   CL 102 02/21/2023 1026   CO2 29 02/21/2023 1026   BUN 17 02/21/2023 1026   CREATININE 0.97 02/21/2023 1026      Component Value Date/Time   CALCIUM 9.4 02/21/2023 1026   ALKPHOS 68 02/21/2023 1026   AST 19 02/21/2023 1026   ALT 11 02/21/2023 1026   BILITOT 0.4 02/21/2023 1026       RADIOGRAPHIC STUDIES: No results found.  ASSESSMENT AND PLAN: This is a very pleasant 72 years old white female with a stage IIa non-small cell lung cancer, adenocarcinoma with positive EGFR mutation in exon 21 (L858R) status post left lower lobectomy with lymph node dissection under the care of Dr. Dorris Fetch. She declined to proceed with adjuvant systemic chemotherapy or targeted therapy. The patient was on observation since her surgical resection until she had disease recurrence in February 2021  presented with pleural-based masses in the left lung with new biopsy and molecular studies positive for EGFR mutation exon 21 (L858R). The patient started treatment with Tagrisso 80 mg p.o. daily status post almost 43 months of treatment.  The patient underwent CT-guided core biopsy of one of the pleural-based left lung progressive lesion and the final pathology was consistent with metastatic adenocarcinoma. The molecular studies by [facility] and it was positive for EGFR L718V, L858R.  She has negative PD-L1 expression.  She also had molecular studies by [employer] performed recently and it showed similar results with many other none actionable mutations in lung cancer. The patient was seen by Dr. Roselind Messier and underwent IMRT to the progressive lesion of the left lung pleural-based masses.   She continued her treatment with Tagrisso 80 mg p.o. daily and she has been tolerating the treatment well except for the fatigue. She had evidence for disease progression on imaging studies in October 2024 this overall generalized trend of continued interval progression of her known pleural disease in the left hemithorax and left anterior medial pleural reflection versus anterior mediastinum with interval increase in the left pleural effusion that is now moderate to large in size with adjacent mild atelectasis in the lower left lung and stable soft  tissue nodules in the region of the GE junction. At that time he had a lengthy discussion with the patient about her condition. I discussed with the patient several options for management of her condition including continued treatment with Tagrisso but adding systemic chemotherapy with carboplatin and Alimta versus discontinuing treatment with Tagrisso and switching her to afatinib which has activity in patient with the uncommon mutation in EGFR L718V in addition to the common L858R mutation versus discontinuing treatment with Tagrisso and starting the patient on treatment  with systemic chemotherapy in addition to Amivantamab but this is usually associated with significant hypersensitivity reaction especially during the first 2 doses. The patient was also given the option of revisiting Dr. Allena Katz at [facility] for discussion of the clinical trial with the [id number] oral EGFR inhibitor. After a lengthy discussion the patient decided to proceed with treatment with afatinib and this will be given at a dose of 30 mg p.o. daily.  She started the first dose of this treatment on February 06, 2023.  The patient has been tolerating this treatment fairly well so far. Assessment and Plan    Non-Small Cell Lung Cancer (NSCLC) Diagnosed November 2019, underwent left lower lobectomy. Recurrence in February 2021, treated with Tagrisso (osimertinib) 80 mg daily for 43 months. Currently on afatinib. Reports improved energy levels and stable weight. Awaiting CT scan on April 04, 2023, to assess tumor status and fluid levels. Genetic counseling results indicate somatic mutations related to the tumor, not germline mutations. Family interested in further genetic testing. - Continue afatinib - Order CT scan on April 04, 2023 - Follow-up appointment with Olegario Messier after CT scan  Pleural Effusion Residual fluid detected by primary doctor. Thoracentesis performed previously. Awaiting CT scan to assess fluid status. - Review CT scan results on April 04, 2023  Anemia Hemoglobin level 9.1 g/dL, slightly lower than previous visit. On iron supplements but missed a dose recently. Reports increased energy levels. - Continue iron supplements - Monitor hemoglobin levels  Skin Fissures Reports splitting and painful fingers, a side effect of afatinib. Using prescribed cream. - Continue using prescribed cream for skin fissures  Diarrhea Initially significant diarrhea, now almost normal. A known side effect of afatinib. - Monitor bowel movements and report any  changes  Epistaxis Reports daily epistaxis, likely due to nasal dryness. Using AYR nasal gel, which causes stinging but is tolerable. - Continue using AYR nasal gel - Monitor for any changes in epistaxis frequency or severity  Fungal Rash Developed a fungal rash under the upper breast. Dermatologist recommended Lotrimin, which has improved the condition. - Continue using Lotrimin as needed  General Health Maintenance Blood pressure slightly elevated, likely due to stress from appointment scheduling issues. - Monitor blood pressure - Provide reassurance and stress management advice  Follow-up - Attend CT scan on April 04, 2023 - Follow-up appointment with Olegario Messier on April 04, 2023.   The patient was advised to call immediately if she has any other concerning symptoms in the interval. The patient voices understanding of current disease status and treatment options and is in agreement with the current care plan. All questions were answered. The patient knows to call the clinic with any problems, questions or concerns. We can certainly see the patient much sooner if necessary. The total time spent in the appointment was 30 minutes.  Disclaimer: This note was dictated with voice recognition software. Similar sounding words can inadvertently be transcribed and may not be corrected upon review.

## 2023-03-14 ENCOUNTER — Other Ambulatory Visit (HOSPITAL_COMMUNITY): Payer: Self-pay

## 2023-03-14 ENCOUNTER — Telehealth: Payer: Self-pay | Admitting: Pharmacist

## 2023-03-14 NOTE — Telephone Encounter (Signed)
Oral Chemotherapy Pharmacist Encounter   Received phone call from patient regarding question if dramamine and lactaid interact with Gilotrif. Patient stated that she is going on a [employer] with family soon and wanted to make sure these were OK to take. Advised patient that both medications are OK to take with Gilotrif and if needed do not have to be spaced apart from Gilotrif administration.   Patient expressed appreciation - patient knows to call office with any questions or concerns.   Sherry Ruffing, PharmD, BCPS, BCOP Hematology/Oncology Clinical Pharmacist [facility] Oral Chemotherapy Navigation Clinics [phone] 03/14/2023 1:13 PM

## 2023-03-21 ENCOUNTER — Other Ambulatory Visit: Payer: Self-pay

## 2023-03-21 NOTE — Progress Notes (Signed)
Specialty Pharmacy Refill Coordination Note  [name] is a 72 y.o. female contacted today regarding refills of specialty medication(s) Afatinib Dimaleate   Patient requested Delivery   Delivery date: 03/28/23   Verified address: [address], [place], [address]   Medication will be filled on 03/27/23.

## 2023-03-27 ENCOUNTER — Other Ambulatory Visit: Payer: Self-pay

## 2023-04-02 NOTE — Progress Notes (Unsigned)
[facility] OFFICE PROGRESS NOTE  Cheryl Boston, MD [address] [place] [address]  DIAGNOSIS:  Recurrent non-small cell lung cancer initially diagnosed as stage IIA (T2b, N0, M0) non-small cell lung cancer, adenocarcinoma diagnosed in November 2019  Molecular studies showed EGFR mutation in exon 21 (L858R) PD-L1 expression 0   Biomarker Findings on disease progression in 02/2022 Microsatellite status - MS-Stable Tumor Mutational Burden - 4 Muts/Mb Genomic Findings For a complete list of the genes assayed, please refer to the Appendix. EGFR L718V, L858R CTNNB1 D32Y SMAD4 E330K 7 Disease relevant genes with no reportable alterations: ALK, BRAF, ERBB2, KRAS, MET, RET, ROS1   Molecular studies by [id number] performed in September 2024 showed similar results  PRIOR THERAPY: 1) status post left lower lobectomy with lymph node dissection on April 25, 2018 under the care of Dr. Dorris Jacobs.  She declined adjuvant therapy 2) status post palliative radiotherapy with IMRT to the progressive pleural-based lung masses under the care of Dr. Roselind Jacobs completed on February 28, 2022. 3) Osimertinib (Tagrisso) 80 mg p.o. daily.  First dose was on June 19, 2019.  Status post 43 months of treatment.   CURRENT THERAPY:  Afatinib 30 mg p.o. daily. First dose February 06, 2023.   INTERVAL HISTORY: [name] Cheryl 72 y.o. female returns to the clinic today for a follow-up visit accompanied by her son.  The patient was last seen in the clinic on 03/09/2023 by Dr. Arbutus Jacobs.  The patient is currently on treatment with afatinib.  She started this in October 2024. She does have some side effects with this that is different for her than when she was on Tagrisso in the past. At her last appointment, she was being treated for fungal rash under her upper breast which was managed with Lotrimin under the direction of her dermatologist. She was also given another compounded steroid cream. She  does feel that the rash is "drying up". Overall, the rash is still present and is moving slightly.  Previously under her breasts and now is more in the center of her breast.  She also has a rash with some pustules in the suprapubic area.  This is often pruritic for the patient.  Her dermatologist had recommended Diflucan but the patient has several medication allergies and she is afraid to take this.  He also has some cracking of the skin by her fingers.  She is also been struggling with some epistaxis in the right nostril.  She uses saline spray, Neosporin, and Aquaphor.  She does have an ears nose and throat doctor but has not seen them in some time.  She tries not to forcefully blow her nose.  The bleeding is worse in the morning.  She has been trying to take Imodium daily or every other day to keep the diarrhea at bay.  She denies any fever or chills.  She is not have any weight loss.  She reports overall her breathing is "good" and she has more energy.  She had a thoracentesis a few weeks ago which did help.  She denies any significant cough but she often clears her throat from phlegm.  She does also struggle with reflux but she is not able to take PPIs due to allergy.  She takes Pepcid once a day.  She is worried about taking Pepcid more than once a day.  Denies any nausea or vomiting.  Denies any significant headache or visual changes.  She recently had a restaging CT scan.  She is here today for evaluation and to review her scan results.  MEDICAL HISTORY: Past Medical History:  Diagnosis Date   Anemia    Atherosclerosis    Family history of lung cancer    GERD (gastroesophageal reflux disease)    Hemoptysis    History of radiation therapy    Left Lung- 02/15/22-02/28/22- Dr. Antony Jacobs   Hyperlipidemia    Hypertension    IBS (irritable bowel syndrome)    Lung mass    Migraines    Spondylosis    Vitamin D deficiency     ALLERGIES:  is allergic to avocado, influenza vaccines,  lisinopril, metoprolol, nexium [esomeprazole magnesium], penicillins, pork-derived products, proton pump inhibitors, sulfites, wasp venom protein, zantac [ranitidine hcl], cefaclor, chlorhexidine, chloroxine, ciprofloxacin hcl, clarithromycin, gatifloxacin, other, sulfa antibiotics, sulfamethoxazole-trimethoprim, ambrosia artemisiifolia (ragweed) skin test, buckwheat, cat hair extract, dog epithelium (canis lupus familiaris), egg-derived products, fiber, fluogen [influenza virus vaccine], lentil, pollen extract, rumex acetosella (sheep's sorrel), short ragweed pollen ext, sulfur, aspartame, chocolate, dilaudid [hydromorphone hcl], fentanyl, ivp dye [iodinated contrast media], ketorolac, milk-related compounds, molds & smuts, oxycodone, prednisolone, shellfish allergy, soap, and tilactase.  MEDICATIONS:  Current Outpatient Medications  Medication Sig Dispense Refill   afatinib dimaleate (GILOTRIF) 30 MG tablet Take 1 tablet (30 mg total) by mouth daily. Take on an empty stomach 1hr before or 2hrs after meals. 30 tablet 2   Carboxymethylcellul-Glycerin (LUBRICATING EYE DROPS OP) Place 1 drop into both eyes daily as needed (dry eyes).     Cholecalciferol (VITAMIN D3) LIQD Take 2,000 Units by mouth daily.     Cyanocobalamin (VITAMIN B-12) 1000 MCG SUBL Place 1 tablet under the tongue once a week.     diltiazem (CARDIZEM CD) 120 MG 24 hr capsule Take 120 mg by mouth daily.     EPINEPHrine 0.3 mg/0.3 mL IJ SOAJ injection Inject 0.3 mg into the muscle as needed for anaphylaxis. (Patient not taking: Reported on 02/21/2023)     famotidine (PEPCID) 40 MG tablet Take 40 mg by mouth daily.     LORazepam (ATIVAN) 0.5 MG tablet 1 tablet p.o. 15-30 minutes before thoracentesis, repeat x 1 if needed 2 tablet 0   Multiple Vitamin (MULTIVITAMIN) tablet Take 1 tablet by mouth daily.     Omega 3-6-9 Fatty Acids (OMEGA-3 FUSION) LIQD Take by mouth.     OVER THE COUNTER MEDICATION Iron gummies.     PRESCRIPTION MEDICATION  Apply 1 application topically 2 (two) times daily as needed for itching. Dr Dallas Schimke 2019     rizatriptan (MAXALT) 10 MG tablet Take 5 mg by mouth as needed for migraine. May repeat in 2 hours if needed (Patient not taking: Reported on 02/03/2022)     No current facility-administered medications for this visit.    SURGICAL HISTORY:  Past Surgical History:  Procedure Laterality Date   BROW LIFT AND BLEPHAROPLASTY  03/15/2013   CATARACT EXTRACTION, BILATERAL  06/23/2014   CHEST TUBE INSERTION Left 05/10/2018   Procedure: CHEST TUBE INSERTION;  Surgeon: Cheryl Slot, MD;  Location: [facility] OR;  Service: Thoracic;  Laterality: Left;   COLONOSCOPY  05/2014   ESOPHAGOGASTRODUODENOSCOPY  2019   EYE SURGERY     IR THORACENTESIS ASP PLEURAL SPACE W/IMG GUIDE  05/24/2018   IR THORACENTESIS ASP PLEURAL SPACE W/IMG GUIDE  06/14/2018   IR THORACENTESIS ASP PLEURAL SPACE W/IMG GUIDE  02/02/2023   TONSILLECTOMY     VIDEO ASSISTED THORACOSCOPY (VATS)/ LOBECTOMY Left 04/25/2018   Procedure: VIDEO ASSISTED THORACOSCOPY (VATS)LEFT LOWER  LOBECTOMY, NODE DISSECTION;  Surgeon: Cheryl Slot, MD;  Location: [facility] OR;  Service: Thoracic;  Laterality: Left;   VIDEO BRONCHOSCOPY WITH ENDOBRONCHIAL NAVIGATION N/A 03/07/2018   Procedure: VIDEO BRONCHOSCOPY WITH ENDOBRONCHIAL NAVIGATION;  Surgeon: Leslye Peer, MD;  Location: [facility] OR;  Service: Thoracic;  Laterality: N/A;   VIDEO BRONCHOSCOPY WITH INSERTION OF INTERBRONCHIAL VALVE (IBV) N/A 05/10/2018   Procedure: VIDEO BRONCHOSCOPY WITH INSERTION OF INTERBRONCHIAL VALVE (IBV);  Surgeon: Cheryl Slot, MD;  Location: [facility] OR;  Service: Thoracic;  Laterality: N/A;   VIDEO BRONCHOSCOPY WITH INSERTION OF INTERBRONCHIAL VALVE (IBV) N/A 06/13/2018   Procedure: VIDEO BRONCHOSCOPY WITH REMOVAL OF INTERBRONCHIAL VALVE (IBV) x 2;  Surgeon: Cheryl Slot, MD;  Location: [facility] OR;  Service: Thoracic;  Laterality: N/A;    REVIEW OF SYSTEMS:   Review of Systems   Constitutional: Stable fatigue. Negative for appetite change, chills, fever and unexpected weight change.  HENT: Positive for epistaxis. Negative for mouth sores, sore throat and trouble swallowing.   Eyes: Negative for eye problems and icterus.  Respiratory: Improved dyspnea. Negative for cough, hemoptysis, and wheezing.   Cardiovascular: Negative for chest pain and leg swelling.  Gastrointestinal: Negative for abdominal pain, constipation, diarrhea, nausea and vomiting.  Genitourinary: Negative for bladder incontinence, difficulty urinating, dysuria, frequency and hematuria.   Musculoskeletal: Negative for back pain, gait problem, neck pain and neck stiffness.  Skin: Positive for rash and itching.  Neurological: Negative for dizziness, extremity weakness, gait problem, headaches, light-headedness and seizures.  Hematological: Negative for adenopathy. Does not bruise/bleed easily.  Psychiatric/Behavioral: Negative for confusion, depression and sleep disturbance. The patient is not nervous/anxious.     PHYSICAL EXAMINATION:  There were no vitals taken for this visit.  ECOG PERFORMANCE STATUS: 1  Physical Exam  Constitutional: Oriented to person, place, and time and well-developed, well-nourished, and in no distress. HENT:  Head: Normocephalic and atraumatic.  Mouth/Throat: Oropharynx is clear and moist. No oropharyngeal exudate.  Eyes: Conjunctivae are normal. Right eye exhibits no discharge. Left eye exhibits no discharge. No scleral icterus.  Neck: Normal range of motion. Neck supple.  Cardiovascular: Normal rate, regular rhythm, normal heart sounds and intact distal pulses.   Pulmonary/Chest: Effort normal and breath sounds normal. No respiratory distress. No wheezes. No rales.  Abdominal: Soft. Bowel sounds are normal. Exhibits no distension and no mass. There is no tenderness.  Musculoskeletal: Normal range of motion. Exhibits no edema.  Lymphadenopathy:    No cervical  adenopathy.  Neurological: Alert and oriented to person, place, and time. Exhibits normal muscle tone. Gait normal. Coordination normal.  Skin: Rash on chest and suprapubic rash shown below.      Skin is warm and dry. No rash noted. Not diaphoretic. No erythema. No pallor.  Psychiatric: Mood, memory and judgment normal.  Vitals reviewed.  LABORATORY DATA: Lab Results  Component Value Date   WBC 6.7 03/09/2023   HGB 9.1 (L) 03/09/2023   HCT 27.4 (L) 03/09/2023   MCV 96.5 03/09/2023   PLT 338 03/09/2023      Chemistry      Component Value Date/Time   NA 138 03/09/2023 0955   K 3.5 03/09/2023 0955   CL 103 03/09/2023 0955   CO2 30 03/09/2023 0955   BUN 15 03/09/2023 0955   CREATININE 0.78 03/09/2023 0955      Component Value Date/Time   CALCIUM 9.2 03/09/2023 0955   ALKPHOS 70 03/09/2023 0955   AST 20 03/09/2023 0955   ALT 13 03/09/2023 0955  BILITOT 0.4 03/09/2023 0955       RADIOGRAPHIC STUDIES:  No results found.   ASSESSMENT/PLAN:  This is a very pleasant 72 year old Caucasian female diagnosed initially as a stage IIa non-small cell lung cancer, adenocarcinoma.  She is positive for an EGFR mutation exon 21 (L858R).  She was diagnosed in November 2019.  She is status post a left lower lobectomy with lymph node dissection under the care of Dr. Dorris Jacobs in January 2020.  She declined to proceed with adjuvant systemic chemotherapy or targeted therapy after her surgical resection.  Therefore she continue on observation.  She then was found to have evidence of disease progression with suspicious pleural-based mass and nodularity in the left lung.  This was confirmed by repeat biopsy.  Molecular studies showed she is positive for EGFR mutation.  She then started on treatment with Tagrisso 80 mg p.o. daily status post 43 months of treatment.  She had evidence of disease progression.  She had a CT-guided biopsy of one of the pleural-based left lung progressive  lesions and the final pathology was consistent metastatic adenocarcinoma.  The molecular studies by [facility] and it was positive for EGFR L718V, L858R. She has negative PD-L1 expression. She also had molecular studies by [employer] performed recently and it showed similar results with many other none actionable mutations in lung cancer.    The patient was seen by Dr. Roselind Jacobs and underwent IMRT to the progressive lesion of the left lung pleural-based masses.   She continued her treatment with Tagrisso 80 mg p.o. daily.   She had evidence for disease progression on imaging studies in October 2024 this overall generalized trend of continued interval progression of her known pleural disease in the left hemithorax and left anterior medial pleural reflection versus anterior mediastinum with interval increase in the left pleural effusion that is now moderate to large in size with adjacent mild atelectasis in the lower left lung and stable soft tissue nodules in the region of the GE junction.   Therefore, Dr. Arbutus Jacobs discussed her options of agrisso but adding systemic chemotherapy with carboplatin and Alimta versus discontinuing treatment with Tagrisso and switching her to afatinib which has activity in patient with the uncommon mutation in EGFR L718V in addition to the common L858R mutation versus discontinuing treatment with Tagrisso and starting the patient on treatment with systemic chemotherapy in addition to Amivantamab but this is usually associated with significant hypersensitivity reaction especially during the first 2 doses.   The patient was also given the option of revisiting Dr. Allena Katz at [facility] for discussion of the clinical trial with the [id number] oral EGFR inhibitor.   She opted for afatinib and this will be given at a dose of 30 mg p.o. daily.  She started the first dose of this treatment on February 06, 2023.    The patient was seen with Dr. Arbutus Jacobs today.  Dr. Arbutus Jacobs  personally and independently reviewed the scan and discussed results with the patient today.  The scan showed a positive response to treatment.  Dr. Arbutus Jacobs recommends she continue on the same treatment at the same dose.   Will see her back for follow-up visit in 6 weeks for evaluation repeat blood work.  Dr. Arbutus Jacobs feels that her rash is a drug rash secondary to her Gilotrif.  He recommended clindamycin gel, doxycycline, and a Medrol Dosepak.  The patient is worried about taking all of these medications as she has an upcoming cruise.  The patient will pick them  up and may only take 1 or 2 of them.  She had some fatigue with doxycycline in the past.  If needed Dr. Arbutus Jacobs discussed she can try taking this once a day.  I encouraged the patient to follow-up with her ENT to see if there is anything amenable to cauterization causing her nosebleeds.  She will continue using saline spray for now and avoiding forceful blowing of her nose.  She will continue using Imodium if needed for diarrhea.  She has been taking this every other day.  She will continue using Pepcid if needed for her reflux.  She was also encouraged to sit upright for 2 to 3 hours after eating.  She will continue to take iron for her anemia.   We also discussed lifestyle management for intertrigo. However, after inspection of her rash, this did not appear consistent with this. I did add information about this to her AVS.   The patient was advised to call immediately if she has any concerning symptoms in the interval. The patient voices understanding of current disease status and treatment options and is in agreement with the current care plan. All questions were answered. The patient knows to call the clinic with any problems, questions or concerns. We can certainly see the patient much sooner if necessary   No orders of the defined types were placed in this encounter.    Cheryl Alcaide L Genean Adamski,  PA-C 04/02/23  ADDENDUM: Hematology/Oncology Attending:  I had a face-to-face encounter with the patient today.  I reviewed her records, lab, scan and recommended her care plan.  This is a very pleasant 72 years old white female with recurrent non-small cell lung cancer, adenocarcinoma with positive EGFR mutation in exon 21 (L858R) she is status post treatment with Tagrisso for 43 months before she developed resistant mutation with positive EGFR L718V.  The patient is currently undergoing treatment with afatinib 30 mg p.o. daily started on February 06, 2023.  She has been tolerating the treatment well except for the skin rash in several areas especially below her breasts. She had repeat CT scan of the chest performed recently.  I personally and independently reviewed the scan images and discussed the result with the patient and her son [name].  Fortunately her scan showed good response to the treatment with afatinib with improvement of many of the subpleural nodules and pleural fluid. I recommended for her to continue her current treatment with afatinib with the same dose. For the skin rash we will give her prescription for doxycycline 100 mg p.o. twice daily for 2 weeks in addition to clindamycin lotion to be applied to the skin rash area and Medrol Dosepak.  She is a little bit reluctant about using all the medications together and she will be on vacation for 2 weeks with her family.  She will have the prescription available for her to use based on her judgment for the skin rash. She will come back for follow-up visit in 6 weeks for evaluation with repeat blood work. She was advised to call immediately if she has any other concerning symptoms in the interval. The total time spent in the appointment was 30 minutes. Disclaimer: This note was dictated with voice recognition software. Similar sounding words can inadvertently be transcribed and may be missed upon review. Lajuana Matte, MD

## 2023-04-04 ENCOUNTER — Ambulatory Visit (HOSPITAL_COMMUNITY)
Admission: RE | Admit: 2023-04-04 | Discharge: 2023-04-04 | Disposition: A | Discharge status: Home / Self Care | Payer: Medicare Other | Source: Ambulatory Visit | Attending: Internal Medicine | Admitting: Internal Medicine

## 2023-04-04 ENCOUNTER — Encounter (HOSPITAL_COMMUNITY): Payer: Self-pay

## 2023-04-04 ENCOUNTER — Inpatient Hospital Stay: Payer: Medicare Other | Attending: Internal Medicine | Admitting: Physician Assistant

## 2023-04-04 ENCOUNTER — Inpatient Hospital Stay: Payer: Medicare Other

## 2023-04-04 VITALS — BP 179/68 | HR 87 | Temp 97.9°F | Resp 16 | Wt 130.1 lb

## 2023-04-04 DIAGNOSIS — B49 Unspecified mycosis: Secondary | ICD-10-CM | POA: Diagnosis not present

## 2023-04-04 DIAGNOSIS — L270 Generalized skin eruption due to drugs and medicaments taken internally: Secondary | ICD-10-CM

## 2023-04-04 DIAGNOSIS — C3432 Malignant neoplasm of lower lobe, left bronchus or lung: Secondary | ICD-10-CM | POA: Diagnosis present

## 2023-04-04 DIAGNOSIS — J90 Pleural effusion, not elsewhere classified: Secondary | ICD-10-CM | POA: Diagnosis not present

## 2023-04-04 DIAGNOSIS — R040 Epistaxis: Secondary | ICD-10-CM | POA: Diagnosis not present

## 2023-04-04 DIAGNOSIS — C3490 Malignant neoplasm of unspecified part of unspecified bronchus or lung: Secondary | ICD-10-CM | POA: Insufficient documentation

## 2023-04-04 DIAGNOSIS — K219 Gastro-esophageal reflux disease without esophagitis: Secondary | ICD-10-CM | POA: Diagnosis not present

## 2023-04-04 DIAGNOSIS — R21 Rash and other nonspecific skin eruption: Secondary | ICD-10-CM | POA: Diagnosis not present

## 2023-04-04 DIAGNOSIS — C3492 Malignant neoplasm of unspecified part of left bronchus or lung: Secondary | ICD-10-CM

## 2023-04-04 DIAGNOSIS — J209 Acute bronchitis, unspecified: Secondary | ICD-10-CM

## 2023-04-04 DIAGNOSIS — R197 Diarrhea, unspecified: Secondary | ICD-10-CM | POA: Diagnosis not present

## 2023-04-04 DIAGNOSIS — Z902 Acquired absence of lung [part of]: Secondary | ICD-10-CM | POA: Diagnosis not present

## 2023-04-04 DIAGNOSIS — D649 Anemia, unspecified: Secondary | ICD-10-CM | POA: Insufficient documentation

## 2023-04-04 LAB — CBC WITH DIFFERENTIAL (CANCER CENTER ONLY)
Abs Immature Granulocytes: 0.03 10*3/uL (ref 0.00–0.07)
Basophils Absolute: 0 10*3/uL (ref 0.0–0.1)
Basophils Relative: 1 %
Eosinophils Absolute: 0.2 10*3/uL (ref 0.0–0.5)
Eosinophils Relative: 2 %
HCT: 28.8 % — ABNORMAL LOW (ref 36.0–46.0)
Hemoglobin: 9.5 g/dL — ABNORMAL LOW (ref 12.0–15.0)
Immature Granulocytes: 0 %
Lymphocytes Relative: 18 %
Lymphs Abs: 1.5 10*3/uL (ref 0.7–4.0)
MCH: 31.7 pg (ref 26.0–34.0)
MCHC: 33 g/dL (ref 30.0–36.0)
MCV: 96 fL (ref 80.0–100.0)
Monocytes Absolute: 0.7 10*3/uL (ref 0.1–1.0)
Monocytes Relative: 8 %
Neutro Abs: 5.8 10*3/uL (ref 1.7–7.7)
Neutrophils Relative %: 71 %
Platelet Count: 345 10*3/uL (ref 150–400)
RBC: 3 MIL/uL — ABNORMAL LOW (ref 3.87–5.11)
RDW: 14.1 % (ref 11.5–15.5)
WBC Count: 8.2 10*3/uL (ref 4.0–10.5)
nRBC: 0 % (ref 0.0–0.2)

## 2023-04-04 LAB — CMP (CANCER CENTER ONLY)
ALT: 14 U/L (ref 0–44)
AST: 21 U/L (ref 15–41)
Albumin: 4.3 g/dL (ref 3.5–5.0)
Alkaline Phosphatase: 84 U/L (ref 38–126)
Anion gap: 5 (ref 5–15)
BUN: 20 mg/dL (ref 8–23)
CO2: 32 mmol/L (ref 22–32)
Calcium: 9.5 mg/dL (ref 8.9–10.3)
Chloride: 102 mmol/L (ref 98–111)
Creatinine: 0.9 mg/dL (ref 0.44–1.00)
GFR, Estimated: >60 mL/min (ref >60)
Glucose, Bld: 103 mg/dL — ABNORMAL HIGH (ref 70–99)
Potassium: 4.1 mmol/L (ref 3.5–5.1)
Sodium: 139 mmol/L (ref 135–145)
Total Bilirubin: 0.4 mg/dL (ref <1.2)
Total Protein: 6.5 g/dL (ref 6.5–8.1)

## 2023-04-04 MED ORDER — DOXYCYCLINE HYCLATE 100 MG PO TABS: 100.0000 mg | ORAL_TABLET | Freq: Every day | ORAL | 0 refills | Status: AC

## 2023-04-04 MED ORDER — METHYLPREDNISOLONE 4 MG PO TBPK: ORAL_TABLET | ORAL | 0 refills | Status: AC

## 2023-04-04 MED ORDER — CLINDAMYCIN PHOSPHATE 1 % EX GEL: Freq: Two times a day (BID) | CUTANEOUS | 0 refills | Status: DC

## 2023-04-04 MED ORDER — NYSTATIN 100000 UNIT/GM EX POWD: 1.0000 | Freq: Three times a day (TID) | CUTANEOUS | 0 refills | Status: DC

## 2023-04-18 ENCOUNTER — Other Ambulatory Visit: Payer: Self-pay

## 2023-04-18 ENCOUNTER — Other Ambulatory Visit: Payer: Self-pay | Admitting: Internal Medicine

## 2023-04-18 MED ORDER — AFATINIB DIMALEATE 30 MG PO TABS
30.0000 mg | ORAL_TABLET | Freq: Every day | ORAL | 2 refills | Status: DC
  Filled 2023-04-20: qty 30, 30d supply, fill #0
  Filled 2023-05-09: qty 30, 30d supply, fill #1
  Filled 2023-06-08: qty 30, 30d supply, fill #2

## 2023-04-18 NOTE — Progress Notes (Signed)
 Specialty Pharmacy Refill Coordination Note  [name] is a 72 y.o. female contacted today regarding refills of specialty medication(s) Afatinib  Dimaleate (GILOTRIF )   Patient requested Delivery   Delivery date: 04/21/23 or 04/24/23   Verified address: [address]  [place] [id number]   Medication will be filled on 04/20/23.   Pending refill request - UPS  Patient requested a 90 day supply. Patient informed that it will be up to the insurance for coverage. Call patient with any delays.

## 2023-04-20 ENCOUNTER — Ambulatory Visit: Payer: Self-pay | Admitting: Genetic Counselor

## 2023-04-20 ENCOUNTER — Encounter: Payer: Self-pay | Admitting: Genetic Counselor

## 2023-04-20 ENCOUNTER — Telehealth: Payer: Self-pay | Admitting: Genetic Counselor

## 2023-04-20 ENCOUNTER — Other Ambulatory Visit: Payer: Self-pay

## 2023-04-20 DIAGNOSIS — Z1379 Encounter for other screening for genetic and chromosomal anomalies: Secondary | ICD-10-CM

## 2023-04-20 NOTE — Progress Notes (Signed)
 HPI: Ms. Kemmer was previously seen in the [facility] due to a family of cancer and concerns regarding a hereditary predisposition to cancer. Please refer to our prior cancer genetics clinic note for more information regarding Ms. Putnam's medical, social and family histories, and our assessment and recommendations, at the time. Ms. Mcarthy recent genetic test results were disclosed to her, as were recommendations warranted by these results. These results and recommendations are discussed in more detail below.   FAMILY HISTORY:  We obtained a detailed, 4-generation family history.  Significant diagnoses are listed below: Family History  Problem Relation Age of Onset   Dementia Mother    Hyperlipidemia Mother    Hypertension Mother    Thyroid disease Mother    Irritable bowel syndrome Mother    Aortic stenosis Mother    Lung cancer Father 80   Lung cancer Sister 57   Hypertension Maternal Grandmother    Cirrhosis Maternal Grandfather        The patient has two sons who are cancer free.  She has one sister who had lung cancer at 71.  Her parents are both deceased.   The patient's father died of lung cancer at 46.  He had three brothers, two who died in the Holocaust.  His parents also died in the Holocaust.   The patient's mother died at 26 cancer free.  She had three brothers who did not have cancer. Her parents also died cancer free.   Ms. Flegal is unaware of previous family history of genetic testing for hereditary cancer risks. Patient's maternal ancestors are of [place] descent, and paternal ancestors are of [place] descent. There is reported Ashkenazi Jewish ancestry. There is no known consanguinity  GENETIC TEST RESULTS: At the time of Ms. Wisener's visit, we recommended she pursue genetic testing of the CancerNext-Expanded+RNAinsight. This test, which included sequencing and deletion/duplication analysis of the genes listed on the test report, was performed at  [facility]. Ms. Metts was called today with her genetic test results. 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  Since Ms. 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 A copy of the test report has been scanned into Epic and is located under the Molecular Pathology section of the Results Review tab, and a portion of the report is copied below.    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  At this time, it is unknown if this variant is associated with increased cancer risk or if this is a normal finding, but most variants such as this get reclassified to being inconsequential. It should not be used to make medical management decisions. With time, we suspect the lab will determine the significance of this variant, if any. If we do learn more about it, we will try to contact Ms. Whitham to discuss it further. However, it is important to stay in touch with us  periodically and keep the address and phone number up to date.  CANCER SCREENING RECOMMENDATIONS: Ms. Bachand test result does not explain her personal and family history of lung cancer.  Most cancers happen by chance and this negative test suggests that her personal and family history of cancer may fall into this category.    Possible reasons for Ms. Gram's negative genetic test include:  1. There may be a gene mutation in  one of these genes that current testing methods cannot detect but that chance is small.  2. There could be another gene that has not yet been discovered, or that we have not yet tested, that is responsible for the cancer diagnoses in the family.  3.  There may be no hereditary risk for cancer in the family. The cancers in Ms. Mcmurtrey and/or her family may be sporadic/familial or due to other genetic and environmental  factors. 4. It is also possible there is a hereditary cause for the cancer in the family that Ms. Shiflett did not inherit.  Therefore, it is recommended she continue to follow the cancer management and screening guidelines provided by her oncology and primary healthcare provider. An individual's cancer risk and medical management are not determined by genetic test results alone. Overall cancer risk assessment incorporates additional factors, including personal medical history, family history, and any available genetic information that may result in a personalized plan for cancer prevention and surveillance  RECOMMENDATIONS FOR FAMILY MEMBERS:  Family members are encouraged to consider genetic testing for this familial pathogenic variant. Each sibling and child has a 50% chance of having this same mutation.  Individuals of child bearing age who test positive should have their partner tested for CFTR mutations to learn their chances of having a child with Cystic Fibrosis. They may contact our office at [phone] for more information or to schedule an appointment.  Complimentary testing for the familial variant is available for 90 days.  Family members who live outside of the area are encouraged to find a genetic counselor in their area by visiting: [email or web address].      Individuals in this family might be at some increased risk of developing cancer, over the general population risk, simply due to the family history of cancer.  We recommended women in this family have a yearly mammogram beginning at age 35, or 70 years younger than the earliest onset of cancer, an annual clinical breast exam, and perform monthly breast self-exams. Women in this family should also have a gynecological exam as recommended by their primary provider. All family members should be referred for colonoscopy starting at age 58, or 35 years younger than the earliest onset of cancer.  FOLLOW-UP: Lastly, we  discussed with Ms. Vantassel that cancer genetics is a rapidly advancing field and it is possible that new genetic tests will be appropriate for her and/or her family members in the future. We encouraged her to remain in contact with cancer genetics on an annual basis so we can update her personal and family histories and let her know of advances in cancer genetics that may benefit this family.   Our contact number was provided. Ms. Ambrocio questions were answered to her satisfaction, and she knows she is welcome to call us  at anytime with additional questions or concerns.   Darice Monte, MS, CGC Licensed, Patent Attorney [email or web address]

## 2023-04-20 NOTE — Progress Notes (Signed)
 Insurance will not pay for refill until 01.07.25 and refill was sent as 30ds. Patient aware.

## 2023-04-20 NOTE — Telephone Encounter (Signed)
 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  CF is a recessive condition that is characterized by thick mucus in the lungs that cause lung disease, failure to thrive, female infertility and acute/chronic pancreatitis.  A single mutation means that the patient is a carrier and NOT affected. This does not explain her personal and family history of lung cancer.  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  This will not change medical management.

## 2023-04-25 ENCOUNTER — Other Ambulatory Visit (HOSPITAL_COMMUNITY): Payer: Self-pay

## 2023-04-25 ENCOUNTER — Other Ambulatory Visit: Payer: Self-pay

## 2023-05-09 ENCOUNTER — Other Ambulatory Visit: Payer: Self-pay

## 2023-05-09 NOTE — Progress Notes (Signed)
Specialty Pharmacy Refill Coordination Note  [name] is a 73 y.o. female contacted today regarding refills of specialty medication(s) Afatinib Dimaleate (GILOTRIF)   Patient requested Delivery   Delivery date: 05/26/23   Verified address: [address]  [place] [address]   Medication will be filled on 05/25/23.

## 2023-05-12 ENCOUNTER — Telehealth: Payer: Self-pay | Admitting: Pharmacist

## 2023-05-12 DIAGNOSIS — H109 Unspecified conjunctivitis: Secondary | ICD-10-CM

## 2023-05-12 DIAGNOSIS — S0120XA Unspecified open wound of nose, initial encounter: Secondary | ICD-10-CM

## 2023-05-12 NOTE — Telephone Encounter (Signed)
Oral Chemotherapy Pharmacist Encounter   Received phone call from [name] regarding questions about new medications she wsa prescribed and if they have drug-drug interactions with her Gilotrif.  Cheryl states she was recently just prescribed erythromycin topical ointment for her eyes and mupirocin 2% ointment for her nose (she just recently had a procedure to help stop nose bleeds). Confirmed with patient that both medications are OK to take with Gilotrif. Medication list has been updated.  Patient expressed appreciation. Patient knows to call the office with any questions or concerns.   Sherry Ruffing, PharmD, BCPS, BCOP Hematology/Oncology Clinical Pharmacist [facility] Oral [facility]otherapy [facility] [phone] 05/12/2023 9:07 AM

## 2023-05-16 ENCOUNTER — Inpatient Hospital Stay: Payer: Medicare Other

## 2023-05-16 ENCOUNTER — Inpatient Hospital Stay: Payer: Medicare Other | Attending: Internal Medicine | Admitting: Internal Medicine

## 2023-05-16 ENCOUNTER — Other Ambulatory Visit: Payer: Self-pay

## 2023-05-16 VITALS — BP 166/68 | HR 70 | Resp 15 | Ht 64.5 in | Wt 127.0 lb

## 2023-05-16 DIAGNOSIS — D649 Anemia, unspecified: Secondary | ICD-10-CM | POA: Insufficient documentation

## 2023-05-16 DIAGNOSIS — C3490 Malignant neoplasm of unspecified part of unspecified bronchus or lung: Secondary | ICD-10-CM | POA: Diagnosis not present

## 2023-05-16 DIAGNOSIS — C3432 Malignant neoplasm of lower lobe, left bronchus or lung: Secondary | ICD-10-CM | POA: Insufficient documentation

## 2023-05-16 DIAGNOSIS — Z79899 Other long term (current) drug therapy: Secondary | ICD-10-CM | POA: Insufficient documentation

## 2023-05-16 DIAGNOSIS — H109 Unspecified conjunctivitis: Secondary | ICD-10-CM | POA: Diagnosis not present

## 2023-05-16 DIAGNOSIS — H538 Other visual disturbances: Secondary | ICD-10-CM | POA: Insufficient documentation

## 2023-05-16 DIAGNOSIS — R040 Epistaxis: Secondary | ICD-10-CM | POA: Diagnosis not present

## 2023-05-16 DIAGNOSIS — Z902 Acquired absence of lung [part of]: Secondary | ICD-10-CM | POA: Insufficient documentation

## 2023-05-16 DIAGNOSIS — B369 Superficial mycosis, unspecified: Secondary | ICD-10-CM | POA: Insufficient documentation

## 2023-05-16 DIAGNOSIS — R197 Diarrhea, unspecified: Secondary | ICD-10-CM | POA: Insufficient documentation

## 2023-05-16 DIAGNOSIS — C3492 Malignant neoplasm of unspecified part of left bronchus or lung: Secondary | ICD-10-CM

## 2023-05-16 LAB — CMP (CANCER CENTER ONLY)
ALT: 9 U/L (ref 0–44)
AST: 16 U/L (ref 15–41)
Albumin: 3.6 g/dL (ref 3.5–5.0)
Alkaline Phosphatase: 91 U/L (ref 38–126)
Anion gap: 5 (ref 5–15)
BUN: 14 mg/dL (ref 8–23)
CO2: 32 mmol/L (ref 22–32)
Calcium: 9 mg/dL (ref 8.9–10.3)
Chloride: 102 mmol/L (ref 98–111)
Creatinine: 0.8 mg/dL (ref 0.44–1.00)
GFR, Estimated: >60 mL/min (ref >60)
Glucose, Bld: 105 mg/dL — ABNORMAL HIGH (ref 70–99)
Potassium: 3.5 mmol/L (ref 3.5–5.1)
Sodium: 139 mmol/L (ref 135–145)
Total Bilirubin: 0.3 mg/dL (ref 0.0–1.2)
Total Protein: 5.8 g/dL — ABNORMAL LOW (ref 6.5–8.1)

## 2023-05-16 LAB — CBC WITH DIFFERENTIAL (CANCER CENTER ONLY)
Abs Immature Granulocytes: 0.03 10*3/uL (ref 0.00–0.07)
Basophils Absolute: 0 10*3/uL (ref 0.0–0.1)
Basophils Relative: 0 %
Eosinophils Absolute: 0.2 10*3/uL (ref 0.0–0.5)
Eosinophils Relative: 2 %
HCT: 27.3 % — ABNORMAL LOW (ref 36.0–46.0)
Hemoglobin: 8.9 g/dL — ABNORMAL LOW (ref 12.0–15.0)
Immature Granulocytes: 0 %
Lymphocytes Relative: 17 %
Lymphs Abs: 1.3 10*3/uL (ref 0.7–4.0)
MCH: 30.6 pg (ref 26.0–34.0)
MCHC: 32.6 g/dL (ref 30.0–36.0)
MCV: 93.8 fL (ref 80.0–100.0)
Monocytes Absolute: 0.5 10*3/uL (ref 0.1–1.0)
Monocytes Relative: 7 %
Neutro Abs: 5.4 10*3/uL (ref 1.7–7.7)
Neutrophils Relative %: 74 %
Platelet Count: 289 10*3/uL (ref 150–400)
RBC: 2.91 MIL/uL — ABNORMAL LOW (ref 3.87–5.11)
RDW: 13.4 % (ref 11.5–15.5)
WBC Count: 7.4 10*3/uL (ref 4.0–10.5)
nRBC: 0 % (ref 0.0–0.2)

## 2023-05-16 NOTE — Progress Notes (Signed)
[facility] Telephone:([phone]   [phone]  OFFICE PROGRESS NOTE  Cheryl Boston, [name] [address] [place] [address]  DIAGNOSIS: Recurrent non-small cell lung cancer initially diagnosed as stage IIA (T2b, N0, M0) non-small cell lung cancer, adenocarcinoma diagnosed in November 2019  Molecular studies showed EGFR mutation in exon 21 (L858R) PD-L1 expression 0  Biomarker Findings on disease progression in 02/2022 Microsatellite status - MS-Stable Tumor Mutational Burden - 4 Muts/Mb Genomic Findings For a complete list of the genes assayed, please refer to the Appendix. EGFR L718V, L858R CTNNB1 D32Y SMAD4 E330K 7 Disease relevant genes with no reportable alterations: ALK, BRAF, ERBB2, KRAS, MET, RET, ROS1  Molecular studies by [id number] performed in September 2024 showed similar results  PRIOR THERAPY:  1) status post left lower lobectomy with lymph node dissection on April 25, 2018 under the care of Dr. Dorris Fetch.  She declined adjuvant therapy 2) status post palliative radiotherapy with IMRT to the progressive pleural-based lung masses under the care of Dr. Roselind Messier completed on February 28, 2022. 3) Osimertinib (Tagrisso) 80 mg p.o. daily.  First dose was on June 19, 2019.  Status post 43 months of treatment.  CURRENT THERAPY: Afatinib 30 mg p.o. daily.  First dose February 06, 2023.  INTERVAL HISTORY: [name] 73 y.o. female returns to the clinic today for follow-up visit accompanied by sister in law.Discussed the use of AI scribe software for clinical note transcription with the patient, who gave verbal consent to proceed.  History of Present Illness   The patient is a 73 year old female with Metastatic NON-Small cell Lung Cancer, Adenocarcinoma initially diagnosed as stage 2A non-small cell lung cancer who presents for follow-up and management of treatment side effects. She is accompanied by her sister in law.  Initially  diagnosed with stage 2A non-small cell lung cancer, adenocarcinoma, in November 2019, she underwent surgical removal of the left lower lobe. The disease recurred, and an EGFR mutation was identified, leading to treatment with Tagrisso for 43 months. Upon progression, another EGFR mutation (L718V) was found, and she started afatinib 30 mg daily on February 06, 2023. A scan six weeks ago showed a response to afatinib, so the treatment was continued.  She experiences ocular side effects from afatinib, including red, crusty eyes, initially affecting the left eye and then the right, with difficulty opening her eyes and blurry vision. Erythromycin gel was prescribed, and an ophthalmologist removed ingrown eyelashes, providing immediate relief. Her eyes were noted to be extremely dry.  She has continuous epistaxis, with two lesions identified in the right nasal passage. Silver nitrate was used for cauterization, and an antibiotic was prescribed for nasal application. The bleeding has decreased but persists slightly.  A skin fungal infection, particularly under her breasts, is improving with Lotrimin and a prescribed Nystatin powder. A systemic antifungal was suggested by her dermatologist, but she is hesitant due to potential side effects.  She experienced a sinus infection after returning from a trip, treated with doxycycline, one pill daily for nine days, resulting in improvement.  Severe diarrhea occurs about once a week, managed with Imodium. She experienced a vasovagal episode, likely related to anemia and diarrhea, causing near syncope. She feels out of breath when standing and walking, but denies chest pain or breathing issues.  On April 20, 2023, she fell, resulting in a slightly cracked rib on the right side. She uses a spirometer and has seen an orthopedist, who confirmed  no significant injury. She reports ongoing difficulty with her right wrist and arm but feels fortunate as she fell hard on her face  without significant injury. CT scans and x-rays at the ER were unremarkable.       MEDICAL HISTORY: Past Medical History:  Diagnosis Date   Anemia    Atherosclerosis    Family history of lung cancer    GERD (gastroesophageal reflux disease)    Hemoptysis    History of radiation therapy    Left Lung- 02/15/22-02/28/22- Dr. Antony Blackbird   Hyperlipidemia    Hypertension    IBS (irritable bowel syndrome)    Lung mass    Migraines    NSCL CA WITH RECURRENCE 2019   2021   Spondylosis    Vitamin D deficiency     ALLERGIES:  is allergic to avocado, influenza vaccines, lisinopril, metoprolol, nexium [esomeprazole magnesium], penicillins, pork-derived products, proton pump inhibitors, sulfites, wasp venom protein, zantac [ranitidine hcl], cefaclor, chlorhexidine, chloroxine, ciprofloxacin hcl, clarithromycin, gatifloxacin, other, sulfa antibiotics, sulfamethoxazole-trimethoprim, ambrosia artemisiifolia (ragweed) skin test, buckwheat, cat dander, dog epithelium (canis lupus familiaris), egg-derived products, fiber, fluogen [influenza virus vaccine], lentil, pollen extract, rumex acetosella (sheep's sorrel), short ragweed pollen ext, sulfur, aspartame, chocolate, dilaudid [hydromorphone hcl], fentanyl, ivp dye [iodinated contrast media], ketorolac, milk-related compounds, molds & smuts, oxycodone, prednisolone, shellfish allergy, soap, and tilactase.  MEDICATIONS:  Current Outpatient Medications  Medication Sig Dispense Refill   afatinib dimaleate (GILOTRIF) 30 MG tablet Take 1 tablet (30 mg total) by mouth daily. Take on an empty stomach 1hr before or 2hrs after meals. 30 tablet 2   Carboxymethylcellul-Glycerin (LUBRICATING EYE DROPS OP) Place 1 drop into both eyes daily as needed (dry eyes).     Cholecalciferol (VITAMIN D3) LIQD Take 2,000 Units by mouth daily.     clindamycin (CLINDAGEL) 1 % gel Apply topically 2 (two) times daily. 60 g 0   Cyanocobalamin (VITAMIN B-12) 1000 MCG SUBL Place 1  tablet under the tongue once a week.     diltiazem (CARDIZEM CD) 120 MG 24 hr capsule Take 120 mg by mouth daily.     doxycycline (VIBRA-TABS) 100 MG tablet Take 1 tablet (100 mg total) by mouth daily. 28 tablet 0   EPINEPHrine 0.3 mg/0.3 mL IJ SOAJ injection Inject 0.3 mg into the muscle as needed for anaphylaxis. (Patient not taking: Reported on 02/21/2023)     erythromycin ophthalmic ointment Place 1 Application into both eyes 2 (two) times daily.     famotidine (PEPCID) 40 MG tablet Take 40 mg by mouth daily.     LORazepam (ATIVAN) 0.5 MG tablet 1 tablet p.o. 15-30 minutes before thoracentesis, repeat x 1 if needed 2 tablet 0   methylPREDNISolone (MEDROL DOSEPAK) 4 MG TBPK tablet Use as instructed 21 tablet 0   Multiple Vitamin (MULTIVITAMIN) tablet Take 1 tablet by mouth daily.     mupirocin ointment (BACTROBAN) 2 % Apply 1 Application topically 2 (two) times daily. May take 2-3 times daily.     Omega 3-6-9 Fatty Acids (OMEGA-3 FUSION) LIQD Take by mouth.     OVER THE COUNTER MEDICATION Iron gummies.     PRESCRIPTION MEDICATION Apply 1 application topically 2 (two) times daily as needed for itching. Dr Dallas Schimke 2019     rizatriptan (MAXALT) 10 MG tablet Take 5 mg by mouth as needed for migraine. May repeat in 2 hours if needed (Patient not taking: Reported on 02/03/2022)     No current facility-administered medications for this visit.  SURGICAL HISTORY:  Past Surgical History:  Procedure Laterality Date   BROW LIFT AND BLEPHAROPLASTY  03/15/2013   CATARACT EXTRACTION, BILATERAL  06/23/2014   CHEST TUBE INSERTION Left 05/10/2018   Procedure: CHEST TUBE INSERTION;  Surgeon: Loreli Slot, [name];  Location: [facility] OR;  Service: Thoracic;  Laterality: Left;   COLONOSCOPY  05/2014   ESOPHAGOGASTRODUODENOSCOPY  2019   EYE SURGERY     IR THORACENTESIS ASP PLEURAL SPACE W/IMG GUIDE  05/24/2018   IR THORACENTESIS ASP PLEURAL SPACE W/IMG GUIDE  06/14/2018   IR THORACENTESIS ASP PLEURAL SPACE  W/IMG GUIDE  02/02/2023   TONSILLECTOMY     VIDEO ASSISTED THORACOSCOPY (VATS)/ LOBECTOMY Left 04/25/2018   Procedure: VIDEO ASSISTED THORACOSCOPY (VATS)LEFT LOWER LOBECTOMY, NODE DISSECTION;  Surgeon: Loreli Slot, [name];  Location: [facility] OR;  Service: Thoracic;  Laterality: Left;   VIDEO BRONCHOSCOPY WITH ENDOBRONCHIAL NAVIGATION N/A 03/07/2018   Procedure: VIDEO BRONCHOSCOPY WITH ENDOBRONCHIAL NAVIGATION;  Surgeon: Leslye Peer, [name];  Location: [facility] OR;  Service: Thoracic;  Laterality: N/A;   VIDEO BRONCHOSCOPY WITH INSERTION OF INTERBRONCHIAL VALVE (IBV) N/A 05/10/2018   Procedure: VIDEO BRONCHOSCOPY WITH INSERTION OF INTERBRONCHIAL VALVE (IBV);  Surgeon: Loreli Slot, [name];  Location: [facility] OR;  Service: Thoracic;  Laterality: N/A;   VIDEO BRONCHOSCOPY WITH INSERTION OF INTERBRONCHIAL VALVE (IBV) N/A 06/13/2018   Procedure: VIDEO BRONCHOSCOPY WITH REMOVAL OF INTERBRONCHIAL VALVE (IBV) x 2;  Surgeon: Loreli Slot, [name];  Location: [facility] OR;  Service: Thoracic;  Laterality: N/A;    REVIEW OF SYSTEMS:  Constitutional: positive for fatigue Eyes: negative Ears, nose, mouth, throat, and face: negative Respiratory: negative Cardiovascular: negative Gastrointestinal: positive for diarrhea Genitourinary:negative Integument/breast: negative Hematologic/lymphatic: negative Musculoskeletal:negative Neurological: negative Behavioral/Psych: negative Endocrine: negative Allergic/Immunologic: negative   PHYSICAL EXAMINATION: General appearance: alert, cooperative, fatigued, and no distress Head: Normocephalic, without obvious abnormality, atraumatic Neck: no adenopathy, no JVD, supple, symmetrical, trachea midline, and thyroid not enlarged, symmetric, no tenderness/mass/nodules Lymph nodes: Cervical, supraclavicular, and axillary nodes normal. Resp: clear to auscultation bilaterally Back: symmetric, no curvature. ROM normal. No CVA tenderness. Cardio: regular rate and rhythm, S1, S2  normal, no murmur, click, rub or gallop GI: soft, non-tender; bowel sounds normal; no masses,  no organomegaly Extremities: extremities normal, atraumatic, no cyanosis or edema Neurologic: Alert and oriented X 3, normal strength and tone. Normal symmetric reflexes. Normal coordination and gait   ECOG PERFORMANCE STATUS: 1 - Symptomatic but completely ambulatory  Blood pressure (!) 166/68, pulse 70, resp. rate 15, height 5' 4.5" (1.638 m), weight 127 lb (57.6 kg), SpO2 100%.   LABORATORY DATA: Lab Results  Component Value Date   WBC 7.4 05/16/2023   HGB 8.9 (L) 05/16/2023   HCT 27.3 (L) 05/16/2023   MCV 93.8 05/16/2023   PLT 289 05/16/2023      Chemistry      Component Value Date/Time   NA 139 05/16/2023 1014   K 3.5 05/16/2023 1014   CL 102 05/16/2023 1014   CO2 32 05/16/2023 1014   BUN 14 05/16/2023 1014   CREATININE 0.80 05/16/2023 1014      Component Value Date/Time   CALCIUM 9.0 05/16/2023 1014   ALKPHOS 91 05/16/2023 1014   AST 16 05/16/2023 1014   ALT 9 05/16/2023 1014   BILITOT 0.3 05/16/2023 1014       RADIOGRAPHIC STUDIES: No results found.  ASSESSMENT AND PLAN: This is a very pleasant 73 years old white female with a stage IIa non-small cell lung cancer, adenocarcinoma with  positive EGFR mutation in exon 21 (L858R) status post left lower lobectomy with lymph node dissection under the care of Dr. Dorris Fetch. She declined to proceed with adjuvant systemic chemotherapy or targeted therapy. The patient was on observation since her surgical resection until she had disease recurrence in February 2021 presented with pleural-based masses in the left lung with new biopsy and molecular studies positive for EGFR mutation exon 21 (L858R). The patient started treatment with Tagrisso 80 mg p.o. daily status post almost 43 months of treatment.  The patient underwent CT-guided core biopsy of one of the pleural-based left lung progressive lesion and the final pathology was  consistent with metastatic adenocarcinoma. The molecular studies by [facility] and it was positive for EGFR L718V, L858R.  She has negative PD-L1 expression.  She also had molecular studies by [employer] performed recently and it showed similar results with many other none actionable mutations in lung cancer. The patient was seen by Dr. Roselind Messier and underwent IMRT to the progressive lesion of the left lung pleural-based masses.   She continued her treatment with Tagrisso 80 mg p.o. daily and she has been tolerating the treatment well except for the fatigue. She had evidence for disease progression on imaging studies in October 2024 this overall generalized trend of continued interval progression of her known pleural disease in the left hemithorax and left anterior medial pleural reflection versus anterior mediastinum with interval increase in the left pleural effusion that is now moderate to large in size with adjacent mild atelectasis in the lower left lung and stable soft tissue nodules in the region of the GE junction. At that time he had a lengthy discussion with the patient about her condition. I discussed with the patient several options for management of her condition including continued treatment with Tagrisso but adding systemic chemotherapy with carboplatin and Alimta versus discontinuing treatment with Tagrisso and switching her to afatinib which has activity in patient with the uncommon mutation in EGFR L718V in addition to the common L858R mutation versus discontinuing treatment with Tagrisso and starting the patient on treatment with systemic chemotherapy in addition to Amivantamab but this is usually associated with significant hypersensitivity reaction especially during the first 2 doses. The patient was also given the option of revisiting Dr. Allena Katz at [facility] for discussion of the clinical trial with the [id number] oral EGFR inhibitor. After a lengthy discussion the patient decided  to proceed with treatment with afatinib and this will be given at a dose of 30 mg p.o. daily.  She started the first dose of this treatment on February 06, 2023.  The patient has been tolerating this treatment fairly well so far. Assessment and Plan    Stage II A Non-Small Cell Lung Cancer (NSCLC) - Adenocarcinoma Diagnosed in November 2019, underwent left lower lobectomy, treated with Tagrisso for EGFR mutation (Exon 21, L858R). After progression, new EGFR mutation (L718V) identified, started on afatinib 30 mg daily on February 06, 2023. Recent scans showed positive response. Reports side effects: dry eyes, conjunctivitis, keratitis. Discussed risks and benefits; prefers to continue afatinib. - Continue afatinib 30 mg daily - Order follow-up scan in five weeks - Schedule follow-up appointment in six weeks - Monitor and manage side effects  Conjunctivitis and Keratitis Severe eye inflammation, crusting, blurry vision treated with erythromycin gel and eyelash removal. Ophthalmologist recommended punctal plugs and moisture-retaining contact lenses. Discussed risks and benefits. - Continue erythromycin gel as needed - Follow-up with ophthalmologist for potential punctal plugs and moisture-retaining contact lenses  Epistaxis Daily nasal bleeding treated with silver nitrate by ENT. Bleeding decreased but persists slightly. Prescribed antibiotic nasal ointment. - Continue antibiotic nasal ointment - Follow-up with ENT as needed  Rib Fracture Right-sided rib fracture on April 20, 2023. Using spirometer, seen by orthopedist. Fracture healing, reports ongoing wrist and arm difficulty. - Continue using spirometer - Follow-up with orthopedist as needed  Fungal Skin Infection Fungal infection under breasts and other areas. Using Lotrimin and prescribed powder with improvement. Discussed systemic antifungal treatment; patient hesitant due to side effects. - Continue Lotrimin and prescribed powder -  Monitor for improvement before considering systemic antifungal  Sinus Infection Sinus infection likely secondary to nasal bleeding, treated with doxycycline, resolved. - Monitor for recurrence - Consider doxycycline if symptoms recur  Anemia Hemoglobin 8.9, hematocrit 27.3, slightly lower than previous levels (9.1 in November). Likely due to nasal bleeding and decreased appetite. - Encourage increased dietary protein intake - Monitor hemoglobin and hematocrit levels  Diarrhea Experiencing diarrhea once a week, leading to vasovagal episode. Using Imodium with some relief. Discussed management to prevent further episodes. - Continue Imodium as needed - Take two Imodium tablets before eating out  General Health Maintenance Recently returned from a cruise without contracting COVID-19. - Monitor for COVID-19 symptoms after travel  Follow-up - Schedule follow-up scan in five weeks - Schedule follow-up appointment in six weeks - Follow-up with orthopedist, ophthalmologist, and ENT as needed.   She was advised to call immediately if she has any other concerning symptoms in the interval.  The patient voices understanding of current disease status and treatment options and is in agreement with the current care plan. All questions were answered. The patient knows to call the clinic with any problems, questions or concerns. We can certainly see the patient much sooner if necessary. The total time spent in the appointment was 55 minutes.  Disclaimer: This note was dictated with voice recognition software. Similar sounding words can inadvertently be transcribed and may not be corrected upon review.

## 2023-05-18 ENCOUNTER — Telehealth: Payer: Self-pay | Admitting: Internal Medicine

## 2023-05-25 ENCOUNTER — Other Ambulatory Visit (HOSPITAL_COMMUNITY): Payer: Self-pay

## 2023-05-25 ENCOUNTER — Other Ambulatory Visit: Payer: Self-pay

## 2023-06-08 ENCOUNTER — Other Ambulatory Visit: Payer: Self-pay

## 2023-06-08 NOTE — Progress Notes (Signed)
Specialty Pharmacy Ongoing Clinical Assessment Note  [name] is a 73 y.o. female who is being followed by the specialty pharmacy service for Rx[facility] Oncology   Patient's specialty medication(s) reviewed today: Afatinib Dimaleate (GILOTRIF)   Missed doses in the last 4 weeks: 0   Patient/Caregiver did not have any additional questions or concerns.   Therapeutic benefit summary: Patient is achieving benefit   Adverse events/side effects summary: No adverse events/side effects   Patient's therapy is appropriate to: Continue    Goals Addressed             This Visit's Progress    Stabilization of disease       Patient is on track. Patient will maintain adherence         Follow up:  6 months  Bobette Mo Specialty Pharmacist

## 2023-06-08 NOTE — Progress Notes (Signed)
Specialty Pharmacy Refill Coordination Note  [name] is a 73 y.o. female contacted today regarding refills of specialty medication(s) Afatinib Dimaleate (GILOTRIF)   Patient requested Delivery   Delivery date: 06/26/23   Verified address: [address]  [place] [address]   Medication will be filled on 06/23/23.   Insurance test claim next fill date is 06/24/23 which is a Saturday. Patient was requested to call pharmacy in advance if she would run out before the ship date.

## 2023-06-21 ENCOUNTER — Other Ambulatory Visit: Payer: Self-pay

## 2023-06-22 ENCOUNTER — Other Ambulatory Visit: Payer: Self-pay

## 2023-06-23 ENCOUNTER — Other Ambulatory Visit: Payer: Self-pay

## 2023-06-27 ENCOUNTER — Other Ambulatory Visit (HOSPITAL_COMMUNITY): Payer: Self-pay

## 2023-06-29 ENCOUNTER — Other Ambulatory Visit: Payer: Medicare Other

## 2023-06-29 ENCOUNTER — Ambulatory Visit: Payer: Medicare Other | Admitting: Internal Medicine

## 2023-06-30 ENCOUNTER — Other Ambulatory Visit: Payer: Self-pay

## 2023-06-30 NOTE — Progress Notes (Signed)
 Questionnaire sent in error. Patient medication was mailed 3.10.25 and next refill call was timed inappropriately.

## 2023-07-04 ENCOUNTER — Inpatient Hospital Stay: Payer: Medicare Other | Attending: Internal Medicine

## 2023-07-04 ENCOUNTER — Inpatient Hospital Stay (HOSPITAL_BASED_OUTPATIENT_CLINIC_OR_DEPARTMENT_OTHER): Payer: Medicare Other | Admitting: Internal Medicine

## 2023-07-04 ENCOUNTER — Ambulatory Visit (HOSPITAL_COMMUNITY)
Admission: RE | Admit: 2023-07-04 | Discharge: 2023-07-04 | Disposition: A | Discharge status: Home / Self Care | Payer: Medicare Other | Source: Ambulatory Visit | Attending: Internal Medicine | Admitting: Internal Medicine

## 2023-07-04 VITALS — BP 182/75 | HR 80 | Temp 97.5°F | Resp 16 | Ht 64.5 in | Wt 129.9 lb

## 2023-07-04 DIAGNOSIS — Z902 Acquired absence of lung [part of]: Secondary | ICD-10-CM | POA: Insufficient documentation

## 2023-07-04 DIAGNOSIS — I10 Essential (primary) hypertension: Secondary | ICD-10-CM | POA: Diagnosis not present

## 2023-07-04 DIAGNOSIS — Z79899 Other long term (current) drug therapy: Secondary | ICD-10-CM | POA: Diagnosis not present

## 2023-07-04 DIAGNOSIS — C3490 Malignant neoplasm of unspecified part of unspecified bronchus or lung: Secondary | ICD-10-CM | POA: Insufficient documentation

## 2023-07-04 DIAGNOSIS — D649 Anemia, unspecified: Secondary | ICD-10-CM | POA: Diagnosis not present

## 2023-07-04 DIAGNOSIS — C3432 Malignant neoplasm of lower lobe, left bronchus or lung: Secondary | ICD-10-CM | POA: Insufficient documentation

## 2023-07-04 DIAGNOSIS — R197 Diarrhea, unspecified: Secondary | ICD-10-CM | POA: Diagnosis not present

## 2023-07-04 DIAGNOSIS — C3492 Malignant neoplasm of unspecified part of left bronchus or lung: Secondary | ICD-10-CM

## 2023-07-04 DIAGNOSIS — Z9221 Personal history of antineoplastic chemotherapy: Secondary | ICD-10-CM | POA: Insufficient documentation

## 2023-07-04 LAB — CMP (CANCER CENTER ONLY)
ALT: 15 U/L (ref 0–44)
AST: 21 U/L (ref 15–41)
Albumin: 4.3 g/dL (ref 3.5–5.0)
Alkaline Phosphatase: 110 U/L (ref 38–126)
Anion gap: 5 (ref 5–15)
BUN: 19 mg/dL (ref 8–23)
CO2: 32 mmol/L (ref 22–32)
Calcium: 9.4 mg/dL (ref 8.9–10.3)
Chloride: 102 mmol/L (ref 98–111)
Creatinine: 0.84 mg/dL (ref 0.44–1.00)
GFR, Estimated: >60 mL/min (ref >60)
Glucose, Bld: 104 mg/dL — ABNORMAL HIGH (ref 70–99)
Potassium: 3.9 mmol/L (ref 3.5–5.1)
Sodium: 139 mmol/L (ref 135–145)
Total Bilirubin: 0.3 mg/dL (ref 0.0–1.2)
Total Protein: 6.8 g/dL (ref 6.5–8.1)

## 2023-07-04 LAB — CBC WITH DIFFERENTIAL (CANCER CENTER ONLY)
Abs Immature Granulocytes: 0.04 10*3/uL (ref 0.00–0.07)
Basophils Absolute: 0 10*3/uL (ref 0.0–0.1)
Basophils Relative: 1 %
Eosinophils Absolute: 0.2 10*3/uL (ref 0.0–0.5)
Eosinophils Relative: 3 %
HCT: 30.6 % — ABNORMAL LOW (ref 36.0–46.0)
Hemoglobin: 9.9 g/dL — ABNORMAL LOW (ref 12.0–15.0)
Immature Granulocytes: 1 %
Lymphocytes Relative: 20 %
Lymphs Abs: 1.5 10*3/uL (ref 0.7–4.0)
MCH: 31.3 pg (ref 26.0–34.0)
MCHC: 32.4 g/dL (ref 30.0–36.0)
MCV: 96.8 fL (ref 80.0–100.0)
Monocytes Absolute: 0.6 10*3/uL (ref 0.1–1.0)
Monocytes Relative: 8 %
Neutro Abs: 5.1 10*3/uL (ref 1.7–7.7)
Neutrophils Relative %: 67 %
Platelet Count: 365 10*3/uL (ref 150–400)
RBC: 3.16 MIL/uL — ABNORMAL LOW (ref 3.87–5.11)
RDW: 14.3 % (ref 11.5–15.5)
WBC Count: 7.4 10*3/uL (ref 4.0–10.5)
nRBC: 0 % (ref 0.0–0.2)

## 2023-07-04 NOTE — Progress Notes (Signed)
 [facility] [phone]   [phone]  OFFICE PROGRESS NOTE  [name], MD [address] [place] [address]  DIAGNOSIS: Recurrent non-small cell lung cancer initially diagnosed as stage IIA (T2b, N0, M0) non-small cell lung cancer, adenocarcinoma diagnosed in November 2019  Molecular studies showed EGFR mutation in exon 21 (L858R) PD-L1 expression 0  Biomarker Findings on disease progression in 02/2022 Microsatellite status - MS-Stable Tumor Mutational Burden - 4 Muts/Mb Genomic Findings For a complete list of the genes assayed, please refer to the Appendix. EGFR L718V, L858R CTNNB1 D32Y SMAD4 E330K 7 Disease relevant genes with no reportable alterations: ALK, BRAF, ERBB2, KRAS, MET, RET, ROS1  Molecular studies by [id number] performed in September 2024 showed similar results  PRIOR THERAPY:  1) status post left lower lobectomy with lymph node dissection on April 25, 2018 under the care of Dr. Dorris Fetch.  She declined adjuvant therapy 2) status post palliative radiotherapy with IMRT to the progressive pleural-based lung masses under the care of Dr. Roselind Messier completed on February 28, 2022. 3) Osimertinib (Tagrisso) 80 mg p.o. daily.  First dose was on June 19, 2019.  Status post 43 months of treatment.  CURRENT THERAPY: Afatinib 30 mg p.o. daily.  First dose February 06, 2023.  Status post 5 months of treatment.  INTERVAL HISTORY: [name] 73 y.o. female returns to the clinic today for follow-up visit accompanied by son [name].Discussed the use of AI scribe software for clinical note transcription with the patient, who gave verbal consent to proceed.  History of Present Illness   [name] "Lyn" is a 73 year old female with recurrent non-small cell lung cancer who presents for follow-up. She is accompanied by her son, [name].  She has a history of recurrent non-small cell lung cancer, adenocarcinoma, initially  diagnosed in November 2019. She underwent a left lower lobectomy at that time. The cancer recurred in February 2021 on the left side of the chest, and she was started on Tagrisso, which she took for 43 months. In October 2024, due to progression, her treatment was switched to afatinib, which she has been taking for approximately six months at a dose of 30 mg once daily.  She finds afatinib more challenging to tolerate compared to Tagrisso. She experienced severe rashes, which have since resolved after intermittent treatment with doxycycline for a sinus infection. She also experiences diarrhea, which she manages with daily Imodium. If she misses a dose, she experiences severe pain, likened to 'labor pains,' especially if she consumes dairy products.  Her weight has fluctuated; she initially lost weight but has since regained approximately three pounds. She had been sick with a sinus infection for a couple of months, which contributed to her weight loss. Her hemoglobin has improved from 8.9 to 9.9, and her total protein has increased from 5.8 to 6.8, which she attributes to increased protein intake.  She continues to experience a cough, which she describes as bronchial, producing greenish phlegm, likely due to allergies. She has a significant history of allergies, as noted by her extensive allergy list.  She has a history of high blood pressure readings in clinical settings, but reports lower readings at home, with the highest being 140/70. She has not checked her blood pressure at home recently but recalls a recent doctor's visit where it was not elevated.       MEDICAL HISTORY: Past Medical History:  Diagnosis Date   Anemia  Atherosclerosis    Family history of lung cancer    GERD (gastroesophageal reflux disease)    Hemoptysis    History of radiation therapy    Left Lung- 02/15/22-02/28/22- Dr. Antony Blackbird   Hyperlipidemia    Hypertension    IBS (irritable bowel syndrome)    Lung mass     Migraines    NSCL CA WITH RECURRENCE 2019   2021   Spondylosis    Vitamin D deficiency     ALLERGIES:  is allergic to avocado, influenza vaccines, lisinopril, metoprolol, nexium [esomeprazole magnesium], penicillins, pork-derived products, proton pump inhibitors, sulfites, wasp venom protein, zantac [ranitidine hcl], cefaclor, chlorhexidine, chloroxine, ciprofloxacin hcl, clarithromycin, gatifloxacin, other, sulfa antibiotics, sulfamethoxazole-trimethoprim, ambrosia artemisiifolia (ragweed) skin test, buckwheat, cat dander, dog epithelium (canis lupus familiaris), egg-derived products, fiber, fluogen [influenza virus vaccine], lentil, pollen extract, rumex acetosella (sheep's sorrel), short ragweed pollen ext, sulfur, aspartame, chocolate, dilaudid [hydromorphone hcl], fentanyl, ivp dye [iodinated contrast media], ketorolac, milk-related compounds, molds & smuts, oxycodone, prednisolone, shellfish allergy, soap, and tilactase.  MEDICATIONS:  Current Outpatient Medications  Medication Sig Dispense Refill   afatinib dimaleate (GILOTRIF) 30 MG tablet Take 1 tablet (30 mg total) by mouth daily. Take on an empty stomach 1hr before or 2hrs after meals. 30 tablet 2   Carboxymethylcellul-Glycerin (LUBRICATING EYE DROPS OP) Place 1 drop into both eyes daily as needed (dry eyes).     Cholecalciferol (VITAMIN D3) LIQD Take 2,000 Units by mouth daily.     clindamycin (CLINDAGEL) 1 % gel Apply topically 2 (two) times daily. 60 g 0   Cyanocobalamin (VITAMIN B-12) 1000 MCG SUBL Place 1 tablet under the tongue once a week.     diltiazem (CARDIZEM CD) 120 MG 24 hr capsule Take 120 mg by mouth daily.     doxycycline (VIBRA-TABS) 100 MG tablet Take 1 tablet (100 mg total) by mouth daily. 28 tablet 0   EPINEPHrine 0.3 mg/0.3 mL IJ SOAJ injection Inject 0.3 mg into the muscle as needed for anaphylaxis. (Patient not taking: Reported on 02/21/2023)     erythromycin ophthalmic ointment Place 1 Application into both  eyes 2 (two) times daily.     famotidine (PEPCID) 40 MG tablet Take 40 mg by mouth daily.     LORazepam (ATIVAN) 0.5 MG tablet 1 tablet p.o. 15-30 minutes before thoracentesis, repeat x 1 if needed 2 tablet 0   methylPREDNISolone (MEDROL DOSEPAK) 4 MG TBPK tablet Use as instructed 21 tablet 0   Multiple Vitamin (MULTIVITAMIN) tablet Take 1 tablet by mouth daily.     mupirocin ointment (BACTROBAN) 2 % Apply 1 Application topically 2 (two) times daily. May take 2-3 times daily.     Omega 3-6-9 Fatty Acids (OMEGA-3 FUSION) LIQD Take by mouth.     OVER THE COUNTER MEDICATION Iron gummies.     PRESCRIPTION MEDICATION Apply 1 application topically 2 (two) times daily as needed for itching. Dr Dallas Schimke 2019     rizatriptan (MAXALT) 10 MG tablet Take 5 mg by mouth as needed for migraine. May repeat in 2 hours if needed (Patient not taking: Reported on 02/03/2022)     No current facility-administered medications for this visit.    SURGICAL HISTORY:  Past Surgical History:  Procedure Laterality Date   BROW LIFT AND BLEPHAROPLASTY  03/15/2013   CATARACT EXTRACTION, BILATERAL  06/23/2014   CHEST TUBE INSERTION Left 05/10/2018   Procedure: CHEST TUBE INSERTION;  Surgeon: Loreli Slot, MD;  Location: [facility] OR;  Service: Thoracic;  Laterality: Left;  COLONOSCOPY  05/2014   ESOPHAGOGASTRODUODENOSCOPY  2019   EYE SURGERY     IR THORACENTESIS ASP PLEURAL SPACE W/IMG GUIDE  05/24/2018   IR THORACENTESIS ASP PLEURAL SPACE W/IMG GUIDE  06/14/2018   IR THORACENTESIS ASP PLEURAL SPACE W/IMG GUIDE  02/02/2023   TONSILLECTOMY     VIDEO ASSISTED THORACOSCOPY (VATS)/ LOBECTOMY Left 04/25/2018   Procedure: VIDEO ASSISTED THORACOSCOPY (VATS)LEFT LOWER LOBECTOMY, NODE DISSECTION;  Surgeon: Loreli Slot, MD;  Location: [facility] OR;  Service: Thoracic;  Laterality: Left;   VIDEO BRONCHOSCOPY WITH ENDOBRONCHIAL NAVIGATION N/A 03/07/2018   Procedure: VIDEO BRONCHOSCOPY WITH ENDOBRONCHIAL NAVIGATION;  Surgeon:  Leslye Peer, MD;  Location: [facility] OR;  Service: Thoracic;  Laterality: N/A;   VIDEO BRONCHOSCOPY WITH INSERTION OF INTERBRONCHIAL VALVE (IBV) N/A 05/10/2018   Procedure: VIDEO BRONCHOSCOPY WITH INSERTION OF INTERBRONCHIAL VALVE (IBV);  Surgeon: Loreli Slot, MD;  Location: [facility] OR;  Service: Thoracic;  Laterality: N/A;   VIDEO BRONCHOSCOPY WITH INSERTION OF INTERBRONCHIAL VALVE (IBV) N/A 06/13/2018   Procedure: VIDEO BRONCHOSCOPY WITH REMOVAL OF INTERBRONCHIAL VALVE (IBV) x 2;  Surgeon: Loreli Slot, MD;  Location: [facility] OR;  Service: Thoracic;  Laterality: N/A;    REVIEW OF SYSTEMS:  Constitutional: positive for fatigue Eyes: negative Ears, nose, mouth, throat, and face: negative Respiratory: positive for cough and sputum Cardiovascular: negative Gastrointestinal: positive for diarrhea Genitourinary:negative Integument/breast: positive for dryness and rash Hematologic/lymphatic: negative Musculoskeletal:negative Neurological: negative Behavioral/Psych: negative Endocrine: negative Allergic/Immunologic: negative   PHYSICAL EXAMINATION: General appearance: alert, cooperative, fatigued, and no distress Head: Normocephalic, without obvious abnormality, atraumatic Neck: no adenopathy, no JVD, supple, symmetrical, trachea midline, and thyroid not enlarged, symmetric, no tenderness/mass/nodules Lymph nodes: Cervical, supraclavicular, and axillary nodes normal. Resp: clear to auscultation bilaterally Back: symmetric, no curvature. ROM normal. No CVA tenderness. Cardio: regular rate and rhythm, S1, S2 normal, no murmur, click, rub or gallop GI: soft, non-tender; bowel sounds normal; no masses,  no organomegaly Extremities: extremities normal, atraumatic, no cyanosis or edema Neurologic: Alert and oriented X 3, normal strength and tone. Normal symmetric reflexes. Normal coordination and gait   ECOG PERFORMANCE STATUS: 1 - Symptomatic but completely ambulatory  Blood pressure (!)  182/75, pulse 80, temperature (!) 97.5 F (36.4 C), temperature source Temporal, resp. rate 16, height 5' 4.5" (1.638 m), weight 129 lb 14.4 oz (58.9 kg), SpO2 100%.   LABORATORY DATA: Lab Results  Component Value Date   WBC 7.4 07/04/2023   HGB 9.9 (L) 07/04/2023   HCT 30.6 (L) 07/04/2023   MCV 96.8 07/04/2023   PLT 365 07/04/2023      Chemistry      Component Value Date/Time   NA 139 07/04/2023 1104   K 3.9 07/04/2023 1104   CL 102 07/04/2023 1104   CO2 32 07/04/2023 1104   BUN 19 07/04/2023 1104   CREATININE 0.84 07/04/2023 1104      Component Value Date/Time   CALCIUM 9.4 07/04/2023 1104   ALKPHOS 110 07/04/2023 1104   AST 21 07/04/2023 1104   ALT 15 07/04/2023 1104   BILITOT 0.3 07/04/2023 1104       RADIOGRAPHIC STUDIES: CT Chest Wo Contrast Result Date: 07/04/2023 CLINICAL DATA:  Non-small-cell lung cancer. Restaging. * Tracking Code: BO * EXAM: CT CHEST WITHOUT CONTRAST TECHNIQUE: Multidetector CT imaging of the chest was performed following the standard protocol without IV contrast. RADIATION DOSE REDUCTION: This exam was performed according to the departmental dose-optimization program which includes automated exposure control, adjustment of the mA and/or kV  according to patient size and/or use of iterative reconstruction technique. COMPARISON:  04/04/2023 FINDINGS: Cardiovascular: The heart size is normal. No substantial pericardial effusion. Coronary artery calcification is evident. Mild atherosclerotic calcification is noted in the wall of the thoracic aorta. Mediastinum/Nodes: No mediastinal lymphadenopathy. No evidence for gross hilar lymphadenopathy although assessment is limited by the lack of intravenous contrast on the current study. The esophagus has normal imaging features. Tiny are Jewel adjacent to distal esophagus on image 100/9 has been stable over multiple prior comparison exams.There is no axillary lymphadenopathy. Lungs/Pleura: Postsurgical volume loss  again noted left hemithorax. Stable linear scarring or chronic trace subsegmental atelectasis in the right lower lobe. No suspicious pulmonary nodule or mass in the right lung postsurgical scarring identified in the parahilar left lung, stable. No new or progressive pulmonary nodule or mass in the left lung parenchyma. Chronic loculated paraspinal left pleural effusion has decreased in the interval. Compare 12 mm paraspinal thickness on image 60/9 today to 19 mm paraspinal thickness on 04/04/2023. Pleural fluid at the left lung base is similar to prior. No new pleural nodularity on the current study. Index left pleural nodule measured deep to the left third rib at 6 mm previously is 5 mm today on image 17/9. Index posterolateral pleural nodule measured previously at 3-4 mm is almost imperceptible and is unmeasurable today on image 25/9, immediately deep to the fourth rib. Index 7 mm anterior pleural nodule measured previously is 7 mm again today on 53/none. 10 mm index nodule anterior mediastinum versus anterior pleural reflection measured on the previous study is now 7 mm on image 79/9. The 16 x 6 mm nodule measured previously anterior to the heart is 13 x 6 mm today on image 111/9. The 12 mm nodule barely perceptible in the deep posterior left costophrenic sulcus previously is not visible today. No new suspicious pleural nodule or mass in the left hemithorax on today's exam. Upper Abdomen: Tiny hypodensity in the dome of the liver is unchanged in the interval. This has been stable back to 01/03/2022 consistent with benign etiology and probably a tiny cyst. No adrenal nodule or mass on today's study. Small lymph nodes seen adjacent to the esophagogastric junction previously are not evident on the current exam. Musculoskeletal: No worrisome lytic or sclerotic osseous abnormality. Bony callus in the lateral right seventh, eighth, and ninth ribs is new in the interval but most suggestive of previous trauma. No  evidence for lytic or sclerotic lesion in the thoracic spine. No thoracic spine fracture evident. IMPRESSION: 1. Index left-sided pleural nodules are stable to smaller in the interval. No new suspicious pleural nodule or mass in the left hemithorax on today's exam. 2. Interval decrease in size of the chronic loculated paraspinal left pleural effusion with similar appearance of small effusion at the left base. 3. No new or progressive findings in the chest. 4. Bony callus in the lateral right seventh, eighth, and ninth ribs is new in the interval but most suggestive of previous trauma. 5.  Aortic Atherosclerois (ICD10-170.0) Electronically Signed   By: Kennith Center M.D.   On: 07/04/2023 12:51    ASSESSMENT AND PLAN: This is a very pleasant 73 years old white female with a stage IIa non-small cell lung cancer, adenocarcinoma with positive EGFR mutation in exon 21 (L858R) status post left lower lobectomy with lymph node dissection under the care of Dr. Dorris Fetch. She declined to proceed with adjuvant systemic chemotherapy or targeted therapy. The patient was on observation since her  surgical resection until she had disease recurrence in February 2021 presented with pleural-based masses in the left lung with new biopsy and molecular studies positive for EGFR mutation exon 21 (L858R). The patient started treatment with Tagrisso 80 mg p.o. daily status post almost 43 months of treatment.  The patient underwent CT-guided core biopsy of one of the pleural-based left lung progressive lesion and the final pathology was consistent with metastatic adenocarcinoma. The molecular studies by [facility] and it was positive for EGFR L718V, L858R.  She has negative PD-L1 expression.  She also had molecular studies by [id number] performed recently and it showed similar results with many other none actionable mutations in lung cancer. The patient was seen by Dr. Roselind Messier and underwent IMRT to the progressive lesion of the  left lung pleural-based masses.   She continued her treatment with Tagrisso 80 mg p.o. daily and she has been tolerating the treatment well except for the fatigue. She had evidence for disease progression on imaging studies in October 2024 this overall generalized trend of continued interval progression of her known pleural disease in the left hemithorax and left anterior medial pleural reflection versus anterior mediastinum with interval increase in the left pleural effusion that is now moderate to large in size with adjacent mild atelectasis in the lower left lung and stable soft tissue nodules in the region of the GE junction. At that time he had a lengthy discussion with the patient about her condition. I discussed with the patient several options for management of her condition including continued treatment with Tagrisso but adding systemic chemotherapy with carboplatin and Alimta versus discontinuing treatment with Tagrisso and switching her to afatinib which has activity in patient with the uncommon mutation in EGFR L718V in addition to the common L858R mutation versus discontinuing treatment with Tagrisso and starting the patient on treatment with systemic chemotherapy in addition to Amivantamab but this is usually associated with significant hypersensitivity reaction especially during the first 2 doses. The patient was also given the option of revisiting Dr. Allena Katz at [facility] for discussion of the clinical trial with the [id number] oral EGFR inhibitor. After a lengthy discussion the patient decided to proceed with treatment with afatinib and this will be given at a dose of 30 mg p.o. daily.  She started the first dose of this treatment on February 06, 2023.  The patient has been tolerating this treatment fairly well so far except for the few episodes of diarrhea and she is taking Imodium in addition to dryness of the skin and mild rash. She had repeat CT scan of the chest performed earlier  today.  I personally and independently reviewed the scan images and compared to the previous studies and showed the images to the patient and her son. Her scan showed further improvement of her disease.     Recurrent non-small cell lung cancer, adenocarcinoma Recurrent non-small cell lung cancer, adenocarcinoma, initially diagnosed in November 2019. Underwent left lower lobectomy and was stable until recurrence in February 2021. Treated with Tagrisso for 43 months until progression in October 2024, then started on afatinib. On afatinib for approximately six months with recent scans showing further shrinkage of nodules in the left lung, indicating a positive response. Average progression-free survival for afatinib is around 10.0 months, but individual response can vary. Discussed potential future treatment options, including clinical trials and new drug combinations, if progression occurs. Newer drugs approved for non-small cell lung cancer with EGFR mutation include amivantamab and Lazertinib, which are approved  as frontline options but have significant side effects. - Continue afatinib 30 mg once daily - Schedule follow-up scan in three months  Adverse effects of afatinib Experiencing adverse effects from afatinib, including rashes and diarrhea. Rashes improved with doxycycline, and diarrhea managed with daily Imodium. Severe pain occurs if Imodium is not taken, likely due to the medication. - Continue Imodium for diarrhea management  Anemia Hemoglobin improved from 8.9 g/dL to 9.9 g/dL, consistent with baseline. Improvement attributed to dietary changes, including increased protein intake. - Continue dietary modifications to maintain hemoglobin levels - Re-evaluate hemoglobin levels at next visit  Hypertension Blood pressure recorded at 180 mmHg, but reports lower readings at home and during recent doctor's visits, with a maximum of 140/70 mmHg. Elevated reading may be due to improper  measurement technique. Advised to monitor blood pressure more frequently at home. - Monitor blood pressure regularly at home - Consult with family doctor if consistently high readings are observed  Sinus infection Had a sinus infection that persisted for several months, contributing to weight loss and a decrease in hemoglobin levels. Infection has resolved, and weight has been regained.  Allergies Experiences bronchial coughing and phlegm production, likely due to seasonal allergies. Phlegm is greenish, which may be related to pollen exposure. - Consider allergy management strategies if symptoms persist  She will come back for follow-up visit with repeat blood work in 6 weeks  The patient was advised to call immediately if she has any concerning symptoms in the interval. The patient voices understanding of current disease status and treatment options and is in agreement with the current care plan. All questions were answered. The patient knows to call the clinic with any problems, questions or concerns. We can certainly see the patient much sooner if necessary. The total time spent in the appointment was 55 minutes.  Disclaimer: This note was dictated with voice recognition software. Similar sounding words can inadvertently be transcribed and may not be corrected upon review.

## 2023-07-13 ENCOUNTER — Other Ambulatory Visit: Payer: Self-pay | Admitting: Medical Oncology

## 2023-07-13 DIAGNOSIS — C3490 Malignant neoplasm of unspecified part of unspecified bronchus or lung: Secondary | ICD-10-CM

## 2023-07-19 ENCOUNTER — Other Ambulatory Visit: Payer: Self-pay

## 2023-07-19 ENCOUNTER — Other Ambulatory Visit: Payer: Self-pay | Admitting: Internal Medicine

## 2023-07-19 MED ORDER — AFATINIB DIMALEATE 30 MG PO TABS
30.0000 mg | ORAL_TABLET | Freq: Every day | ORAL | 2 refills | Status: DC
  Filled 2023-07-20: qty 30, 30d supply, fill #0
  Filled 2023-08-15: qty 30, 30d supply, fill #1
  Filled 2023-09-15: qty 30, 30d supply, fill #2

## 2023-07-19 NOTE — Progress Notes (Signed)
 Specialty Pharmacy Refill Coordination Note  [name] is a 73 y.o. female contacted today regarding refills of specialty medication(s) Afatinib Dimaleate (GILOTRIF)   Patient requested (Patient-Rptd) Delivery   Delivery date: (Patient-Rptd) 07/25/23   Verified address: (Patient-Rptd) [address] [name]  [address]   Medication will be filled on 07/24/23.   This fill date is pending response to refill request from provider. Patient is aware and if they have not received fill by intended date they must follow up with pharmacy.

## 2023-07-20 ENCOUNTER — Other Ambulatory Visit: Payer: Self-pay

## 2023-07-24 ENCOUNTER — Other Ambulatory Visit: Payer: Self-pay

## 2023-08-14 ENCOUNTER — Other Ambulatory Visit: Payer: Self-pay | Admitting: Medical Oncology

## 2023-08-14 DIAGNOSIS — C3490 Malignant neoplasm of unspecified part of unspecified bronchus or lung: Secondary | ICD-10-CM

## 2023-08-15 ENCOUNTER — Inpatient Hospital Stay: Attending: Internal Medicine

## 2023-08-15 ENCOUNTER — Other Ambulatory Visit: Payer: Self-pay

## 2023-08-15 ENCOUNTER — Inpatient Hospital Stay (HOSPITAL_BASED_OUTPATIENT_CLINIC_OR_DEPARTMENT_OTHER): Admitting: Internal Medicine

## 2023-08-15 VITALS — BP 146/49 | HR 73 | Temp 97.0°F | Resp 16 | Ht 64.5 in | Wt 132.4 lb

## 2023-08-15 DIAGNOSIS — Z902 Acquired absence of lung [part of]: Secondary | ICD-10-CM | POA: Diagnosis not present

## 2023-08-15 DIAGNOSIS — R197 Diarrhea, unspecified: Secondary | ICD-10-CM | POA: Diagnosis not present

## 2023-08-15 DIAGNOSIS — C3492 Malignant neoplasm of unspecified part of left bronchus or lung: Secondary | ICD-10-CM | POA: Diagnosis not present

## 2023-08-15 DIAGNOSIS — C3490 Malignant neoplasm of unspecified part of unspecified bronchus or lung: Secondary | ICD-10-CM

## 2023-08-15 DIAGNOSIS — Z9221 Personal history of antineoplastic chemotherapy: Secondary | ICD-10-CM | POA: Insufficient documentation

## 2023-08-15 DIAGNOSIS — C3432 Malignant neoplasm of lower lobe, left bronchus or lung: Secondary | ICD-10-CM | POA: Diagnosis present

## 2023-08-15 DIAGNOSIS — D649 Anemia, unspecified: Secondary | ICD-10-CM | POA: Insufficient documentation

## 2023-08-15 LAB — CBC WITH DIFFERENTIAL (CANCER CENTER ONLY)
Abs Immature Granulocytes: 0.02 10*3/uL (ref 0.00–0.07)
Basophils Absolute: 0.1 10*3/uL (ref 0.0–0.1)
Basophils Relative: 1 %
Eosinophils Absolute: 0.2 10*3/uL (ref 0.0–0.5)
Eosinophils Relative: 3 %
HCT: 28.9 % — ABNORMAL LOW (ref 36.0–46.0)
Hemoglobin: 9.5 g/dL — ABNORMAL LOW (ref 12.0–15.0)
Immature Granulocytes: 0 %
Lymphocytes Relative: 19 %
Lymphs Abs: 1.5 10*3/uL (ref 0.7–4.0)
MCH: 30.8 pg (ref 26.0–34.0)
MCHC: 32.9 g/dL (ref 30.0–36.0)
MCV: 93.8 fL (ref 80.0–100.0)
Monocytes Absolute: 0.6 10*3/uL (ref 0.1–1.0)
Monocytes Relative: 7 %
Neutro Abs: 5.5 10*3/uL (ref 1.7–7.7)
Neutrophils Relative %: 70 %
Platelet Count: 337 10*3/uL (ref 150–400)
RBC: 3.08 MIL/uL — ABNORMAL LOW (ref 3.87–5.11)
RDW: 13.4 % (ref 11.5–15.5)
WBC Count: 7.8 10*3/uL (ref 4.0–10.5)
nRBC: 0 % (ref 0.0–0.2)

## 2023-08-15 LAB — CMP (CANCER CENTER ONLY)
ALT: 17 U/L (ref 0–44)
AST: 23 U/L (ref 15–41)
Albumin: 4 g/dL (ref 3.5–5.0)
Alkaline Phosphatase: 95 U/L (ref 38–126)
Anion gap: 4 — ABNORMAL LOW (ref 5–15)
BUN: 25 mg/dL — ABNORMAL HIGH (ref 8–23)
CO2: 33 mmol/L — ABNORMAL HIGH (ref 22–32)
Calcium: 9.1 mg/dL (ref 8.9–10.3)
Chloride: 103 mmol/L (ref 98–111)
Creatinine: 0.96 mg/dL (ref 0.44–1.00)
GFR, Estimated: >60 mL/min (ref >60)
Glucose, Bld: 105 mg/dL — ABNORMAL HIGH (ref 70–99)
Potassium: 3.9 mmol/L (ref 3.5–5.1)
Sodium: 140 mmol/L (ref 135–145)
Total Bilirubin: 0.2 mg/dL (ref 0.0–1.2)
Total Protein: 6.2 g/dL — ABNORMAL LOW (ref 6.5–8.1)

## 2023-08-15 NOTE — Progress Notes (Signed)
 Specialty Pharmacy Refill Coordination Note  [name] is a 73 y.o. female contacted today regarding refills of specialty medication(s) Afatinib  Dimaleate (GILOTRIF )   Patient requested Delivery   Delivery date: 08/18/23   Verified address: [address] [place]  [address]   Medication will be filled on 08/17/23.

## 2023-08-15 NOTE — Progress Notes (Signed)
 Specialty Pharmacy Ongoing Clinical Assessment Note  [name] is a 73 y.o. female who is being followed by the specialty pharmacy service for [facility] Oncology   Patient's specialty medication(s) reviewed today: Afatinib  Dimaleate (GILOTRIF )   Missed doses in the last 4 weeks: 0   Patient/Caregiver did not have any additional questions or concerns.   Therapeutic benefit summary: Patient is achieving benefit   Adverse events/side effects summary: Experienced adverse events/side effects (continued tolerable diarrhea and skin rash)   Patient's therapy is appropriate to: Continue    Goals Addressed             This Visit's Progress    Slow Disease Progression       Patient is unable to be assessed as therapy was recently initiated. Patient will maintain adherence. Per provider notes patient tolerating therapy aside from continued tolerable diarrhea and skin rash. No updated scans documented to assess progress.          Follow up:  3 months  Tayloranne Lekas M [employer] Specialty Pharmacist

## 2023-08-15 NOTE — Progress Notes (Signed)
 [facility] [phone]   Fax:([phone]) [phone]  OFFICE PROGRESS NOTE  Cheryl Hermes, MD [address] [place] [address]  DIAGNOSIS: Recurrent non-small cell lung cancer initially diagnosed as stage IIA (T2b, N0, M0) non-small cell lung cancer, adenocarcinoma diagnosed in November 2019  Molecular studies showed EGFR mutation in exon 21 (L858R) PD-L1 expression 0  Biomarker Findings on disease progression in 02/2022 Microsatellite status - MS-Stable Tumor Mutational Burden - 4 Muts/Mb Genomic Findings For a complete list of the genes assayed, please refer to the Appendix. EGFR L718V, L858R CTNNB1 D32Y SMAD4 E330K 7 Disease relevant genes with no reportable alterations: ALK, BRAF, ERBB2, KRAS, MET, RET, ROS1  Molecular studies by [id number] performed in September 2024 showed similar results  PRIOR THERAPY:  1) status post left lower lobectomy with lymph node dissection on April 25, 2018 under the care of Dr. Luna Salinas.  She declined adjuvant therapy 2) status post palliative radiotherapy with IMRT to the progressive pleural-based lung masses under the care of Dr. Eloise Hake completed on February 28, 2022. 3) Osimertinib  (Tagrisso ) 80 mg p.o. daily.  First dose was on June 19, 2019.  Status post 43 months of treatment.  CURRENT THERAPY: Afatinib  30 mg p.o. daily.  First dose February 06, 2023.  Status post 6 months of treatment.  INTERVAL HISTORY: [name] 73 y.o. female returns to the clinic today for follow-up visit. Discussed the use of AI scribe software for clinical note transcription with the patient, who gave verbal consent to proceed.  History of Present Illness   [name] "[name]" is a 73 year old female with recurrent non-small cell lung cancer who presents for evaluation and repeat blood work.  Initially diagnosed with non-small cell lung cancer, adenocarcinoma with positive EGFR mutation L858R, in November 2019, she  experienced disease progression on Tagrisso . Molecular studies revealed an additional EGFR mutation, L718V. She is currently on Gilotrif  (afatinib ) 30 mg orally once a day and has been on this treatment for six months.  During a two-week stay in [place] , she experienced a recurrence of symptoms previously associated with afatinib , including a rash under her eye and severe epistaxis. She plans to see an ENT for potential cauterization due to recurrent epistaxis, attributing them to the dry climate in [place] , with noted improvement since returning home.  She experiences diarrhea once or twice a week, which she manages with Imodium. She notes that taking Imodium daily prevents diarrhea, but she sometimes forgets to take it.  Her hemoglobin level is 9.5, consistent with previous levels. She mentions experiencing 'every symptom in the book' while in [place] , but feels much better now.  She has a history of prolonged survival on Tagrisso , with two and a half years without progression and four and a half years with progression. She expresses concern about the duration of effectiveness of her current treatment, afatinib , based on previous experiences.  She has lived in [place]  since 1972 and was married for 53 years. She values her independence and has a strong support network, including her sister-in-law and family in [place] .       MEDICAL HISTORY: Past Medical History:  Diagnosis Date   Anemia    Atherosclerosis    Family history of lung cancer    GERD (gastroesophageal reflux disease)    Hemoptysis    History of radiation therapy    Left Lung- 02/15/22-02/28/22- Dr. Retta Caster   Hyperlipidemia  Hypertension    IBS (irritable bowel syndrome)    Lung mass    Migraines    NSCL CA WITH RECURRENCE 2019   2021   Spondylosis    Vitamin D deficiency     ALLERGIES:  is allergic to avocado, influenza vaccines, lisinopril , metoprolol , nexium [esomeprazole magnesium],  penicillins, pork-derived products, proton pump inhibitors, sulfites, wasp venom protein, zantac [ranitidine hcl], cefaclor, chlorhexidine , chloroxine, ciprofloxacin hcl, clarithromycin, gatifloxacin, other, sulfa antibiotics, sulfamethoxazole-trimethoprim, ambrosia artemisiifolia (ragweed) skin test, buckwheat, cat dander, dog epithelium (canis lupus familiaris), egg-derived products, fiber, fluogen [influenza virus vaccine], lentil, pollen extract, rumex acetosella (sheep's sorrel), short ragweed pollen ext, sulfur, aspartame, chocolate, dilaudid  [hydromorphone  hcl], fentanyl , ivp dye [iodinated contrast media], ketorolac, milk-related compounds, molds & smuts, oxycodone , prednisolone, shellfish allergy, soap, and tilactase.  MEDICATIONS:  Current Outpatient Medications  Medication Sig Dispense Refill   afatinib  dimaleate (GILOTRIF ) 30 MG tablet Take 1 tablet (30 mg total) by mouth daily. Take on an empty stomach 1hr before or 2hrs after meals. 30 tablet 2   Carboxymethylcellul-Glycerin (LUBRICATING EYE DROPS OP) Place 1 drop into both eyes daily as needed (dry eyes).     Cholecalciferol (VITAMIN D3) LIQD Take 2,000 Units by mouth daily.     clindamycin  (CLINDAGEL) 1 % gel Apply topically 2 (two) times daily. 60 g 0   Cyanocobalamin (VITAMIN B-12) 1000 MCG SUBL Place 1 tablet under the tongue once a week.     diltiazem  (CARDIZEM  CD) 120 MG 24 hr capsule Take 120 mg by mouth daily.     doxycycline  (VIBRA -TABS) 100 MG tablet Take 1 tablet (100 mg total) by mouth daily. 28 tablet 0   EPINEPHrine  0.3 mg/0.3 mL IJ SOAJ injection Inject 0.3 mg into the muscle as needed for anaphylaxis. (Patient not taking: Reported on 02/21/2023)     erythromycin ophthalmic ointment Place 1 Application into both eyes 2 (two) times daily.     famotidine (PEPCID) 40 MG tablet Take 40 mg by mouth daily.     LORazepam  (ATIVAN ) 0.5 MG tablet 1 tablet p.o. 15-30 minutes before thoracentesis, repeat x 1 if needed 2 tablet 0    methylPREDNISolone  (MEDROL  DOSEPAK) 4 MG TBPK tablet Use as instructed 21 tablet 0   Multiple Vitamin (MULTIVITAMIN) tablet Take 1 tablet by mouth daily.     mupirocin ointment (BACTROBAN) 2 % Apply 1 Application topically 2 (two) times daily. May take 2-3 times daily.     Omega 3-6-9 Fatty Acids (OMEGA-3 FUSION) LIQD Take by mouth.     OVER THE COUNTER MEDICATION Iron gummies.     PRESCRIPTION MEDICATION Apply 1 application topically 2 (two) times daily as needed for itching. Dr Christene Covey 2019     rizatriptan (MAXALT) 10 MG tablet Take 5 mg by mouth as needed for migraine. May repeat in 2 hours if needed (Patient not taking: Reported on 02/03/2022)     No current facility-administered medications for this visit.    SURGICAL HISTORY:  Past Surgical History:  Procedure Laterality Date   BROW LIFT AND BLEPHAROPLASTY  03/15/2013   CATARACT EXTRACTION, BILATERAL  06/23/2014   CHEST TUBE INSERTION Left 05/10/2018   Procedure: CHEST TUBE INSERTION;  Surgeon: Zelphia Higashi, MD;  Location: [facility] OR;  Service: Thoracic;  Laterality: Left;   COLONOSCOPY  05/2014   ESOPHAGOGASTRODUODENOSCOPY  2019   EYE SURGERY     IR THORACENTESIS ASP PLEURAL SPACE W/IMG GUIDE  05/24/2018   IR THORACENTESIS ASP PLEURAL SPACE W/IMG GUIDE  06/14/2018   IR THORACENTESIS  ASP PLEURAL SPACE W/IMG GUIDE  02/02/2023   TONSILLECTOMY     VIDEO ASSISTED THORACOSCOPY (VATS)/ LOBECTOMY Left 04/25/2018   Procedure: VIDEO ASSISTED THORACOSCOPY (VATS)LEFT LOWER LOBECTOMY, NODE DISSECTION;  Surgeon: Zelphia Higashi, MD;  Location: [facility] OR;  Service: Thoracic;  Laterality: Left;   VIDEO BRONCHOSCOPY WITH ENDOBRONCHIAL NAVIGATION N/A 03/07/2018   Procedure: VIDEO BRONCHOSCOPY WITH ENDOBRONCHIAL NAVIGATION;  Surgeon: Denson Flake, MD;  Location: [facility] OR;  Service: Thoracic;  Laterality: N/A;   VIDEO BRONCHOSCOPY WITH INSERTION OF INTERBRONCHIAL VALVE (IBV) N/A 05/10/2018   Procedure: VIDEO BRONCHOSCOPY WITH INSERTION OF  INTERBRONCHIAL VALVE (IBV);  Surgeon: Zelphia Higashi, MD;  Location: [facility] OR;  Service: Thoracic;  Laterality: N/A;   VIDEO BRONCHOSCOPY WITH INSERTION OF INTERBRONCHIAL VALVE (IBV) N/A 06/13/2018   Procedure: VIDEO BRONCHOSCOPY WITH REMOVAL OF INTERBRONCHIAL VALVE (IBV) x 2;  Surgeon: Zelphia Higashi, MD;  Location: [facility] OR;  Service: Thoracic;  Laterality: N/A;    REVIEW OF SYSTEMS:  Constitutional: positive for fatigue Eyes: negative Ears, nose, mouth, throat, and face: negative Respiratory: negative Cardiovascular: negative Gastrointestinal: negative Genitourinary:negative Integument/breast: negative Hematologic/lymphatic: negative Musculoskeletal:negative Neurological: negative Behavioral/Psych: negative Endocrine: negative Allergic/Immunologic: negative   PHYSICAL EXAMINATION: General appearance: alert, cooperative, fatigued, and no distress Head: Normocephalic, without obvious abnormality, atraumatic Neck: no adenopathy, no JVD, supple, symmetrical, trachea midline, and thyroid not enlarged, symmetric, no tenderness/mass/nodules Lymph nodes: Cervical, supraclavicular, and axillary nodes normal. Resp: clear to auscultation bilaterally Back: symmetric, no curvature. ROM normal. No CVA tenderness. Cardio: regular rate and rhythm, S1, S2 normal, no murmur, click, rub or gallop GI: soft, non-tender; bowel sounds normal; no masses,  no organomegaly Extremities: extremities normal, atraumatic, no cyanosis or edema Neurologic: Alert and oriented X 3, normal strength and tone. Normal symmetric reflexes. Normal coordination and gait   ECOG PERFORMANCE STATUS: 1 - Symptomatic but completely ambulatory  Blood pressure (!) 146/49, pulse 73, temperature (!) 97 F (36.1 C), temperature source Temporal, resp. rate 16, height 5' 4.5" (1.638 m), weight 132 lb 6.4 oz (60.1 kg), SpO2 100%.   LABORATORY DATA: Lab Results  Component Value Date   WBC 7.8 08/15/2023   HGB 9.5 (L)  08/15/2023   HCT 28.9 (L) 08/15/2023   MCV 93.8 08/15/2023   PLT 337 08/15/2023      Chemistry      Component Value Date/Time   NA 139 07/04/2023 1104   K 3.9 07/04/2023 1104   CL 102 07/04/2023 1104   CO2 32 07/04/2023 1104   BUN 19 07/04/2023 1104   CREATININE 0.84 07/04/2023 1104      Component Value Date/Time   CALCIUM  9.4 07/04/2023 1104   ALKPHOS 110 07/04/2023 1104   AST 21 07/04/2023 1104   ALT 15 07/04/2023 1104   BILITOT 0.3 07/04/2023 1104       RADIOGRAPHIC STUDIES: No results found.   ASSESSMENT AND PLAN: This is a very pleasant 73 years old white female with a stage IIa non-small cell lung cancer, adenocarcinoma with positive EGFR mutation in exon 21 (L858R) status post left lower lobectomy with lymph node dissection under the care of Dr. Luna Salinas. She declined to proceed with adjuvant systemic chemotherapy or targeted therapy. The patient was on observation since her surgical resection until she had disease recurrence in February 2021 presented with pleural-based masses in the left lung with new biopsy and molecular studies positive for EGFR mutation exon 21 (L858R). The patient started treatment with Tagrisso  80 mg p.o. daily status post almost 43 months  of treatment.  The patient underwent CT-guided core biopsy of one of the pleural-based left lung progressive lesion and the final pathology was consistent with metastatic adenocarcinoma. The molecular studies by [facility] and it was positive for EGFR L718V, L858R.  She has negative PD-L1 expression.  She also had molecular studies by [employer] performed recently and it showed similar results with many other none actionable mutations in lung cancer. The patient was seen by Dr. Eloise Hake and underwent IMRT to the progressive lesion of the left lung pleural-based masses.   She continued her treatment with Tagrisso  80 mg p.o. daily and she has been tolerating the treatment well except for the fatigue. She had  evidence for disease progression on imaging studies in October 2024 this overall generalized trend of continued interval progression of her known pleural disease in the left hemithorax and left anterior medial pleural reflection versus anterior mediastinum with interval increase in the left pleural effusion that is now moderate to large in size with adjacent mild atelectasis in the lower left lung and stable soft tissue nodules in the region of the GE junction. At that time he had a lengthy discussion with the patient about her condition. I discussed with the patient several options for management of her condition including continued treatment with Tagrisso  but adding systemic chemotherapy with carboplatin and Alimta versus discontinuing treatment with Tagrisso  and switching her to afatinib  which has activity in patient with the uncommon mutation in EGFR L718V in addition to the common L858R mutation versus discontinuing treatment with Tagrisso  and starting the patient on treatment with systemic chemotherapy in addition to Amivantamab but this is usually associated with significant hypersensitivity reaction especially during the first 2 doses. The patient was also given the option of revisiting Dr. Lydia Sams at [facility] for discussion of the clinical trial with the [id number] oral EGFR inhibitor. After a lengthy discussion the patient decided to proceed with treatment with afatinib  and this wasgiven at a dose of 30 mg p.o. daily.  She started the first dose of this treatment on February 06, 2023.  The patient has been tolerating this treatment fairly well so far except for the few episodes of diarrhea and she is taking Imodium in addition to dryness of the skin and mild rash.    Recurrent non-small cell lung cancer with EGFR mutation Recurrent non-small cell lung cancer, adenocarcinoma with EGFR mutations L858R and L718V. On afatinib  30 mg daily for 6 months with no disease progression on the last scan.  Average progression-free survival for afatinib  is 10.6 to 11 months, but outcomes vary. Progression-free survival does not equate to overall survival. Future treatment options if progression occurs include clinical trials and new drug combinations, though some may be unsuitable due to insurance or side effects. Continued monitoring is encouraged, and progression, if it occurs, is not expected to be rapid due to ongoing treatment. Previous progression-free survival on Tagrisso  was 18.9 months with overall survival of 38 months, highlighting her extended response to treatment. - Continue afatinib  30 mg PO daily - Schedule follow-up scan on June 12 - Discuss potential future treatment options if disease progression occurs  Anemia Chronic anemia with hemoglobin at 9.5 g/dL, consistent with previous levels. No new symptoms related to anemia.  Diarrhea Intermittent diarrhea once or twice a week, managed with Imodium. No significant impact on daily activities. - Continue Imodium as needed for diarrhea   The patient was advised to call immediately if she has any other concerning symptoms in the  interval. The patient voices understanding of current disease status and treatment options and is in agreement with the current care plan. All questions were answered. The patient knows to call the clinic with any problems, questions or concerns. We can certainly see the patient much sooner if necessary. The total time spent in the appointment was 35 minutes.  Disclaimer: This note was dictated with voice recognition software. Similar sounding words can inadvertently be transcribed and may not be corrected upon review.

## 2023-08-17 ENCOUNTER — Other Ambulatory Visit: Payer: Self-pay

## 2023-08-17 NOTE — Progress Notes (Signed)
 Insurance will pay for medication 5/7. Left voice mail for patient with update delivery of 5/8.

## 2023-08-23 ENCOUNTER — Other Ambulatory Visit: Payer: Self-pay

## 2023-09-05 ENCOUNTER — Other Ambulatory Visit: Payer: Self-pay

## 2023-09-12 ENCOUNTER — Other Ambulatory Visit (HOSPITAL_COMMUNITY): Payer: Self-pay

## 2023-09-15 ENCOUNTER — Other Ambulatory Visit: Payer: Self-pay

## 2023-09-15 NOTE — Progress Notes (Signed)
 Specialty Pharmacy Refill Coordination Note  Lyn SEMIYAH NEWGENT is a 73 y.o. female contacted today regarding refills of specialty medication(s) Afatinib  Dimaleate (GILOTRIF )   Patient requested Delivery   Delivery date: 09/19/23   Verified address: [address]  [place] [address]   Medication will be filled on 09/18/23.

## 2023-09-18 ENCOUNTER — Other Ambulatory Visit: Payer: Self-pay

## 2023-09-18 NOTE — Progress Notes (Signed)
 Called & left Voicemail for patient about shipment delay due to insurance paying on 6/3. Ship 6/3 for 6/4.

## 2023-09-19 ENCOUNTER — Other Ambulatory Visit: Payer: Self-pay

## 2023-09-28 ENCOUNTER — Ambulatory Visit: Admitting: Internal Medicine

## 2023-09-28 ENCOUNTER — Other Ambulatory Visit

## 2023-09-28 ENCOUNTER — Ambulatory Visit (HOSPITAL_COMMUNITY)

## 2023-10-03 ENCOUNTER — Encounter (INDEPENDENT_AMBULATORY_CARE_PROVIDER_SITE_OTHER): Payer: Self-pay

## 2023-10-03 ENCOUNTER — Other Ambulatory Visit: Payer: Self-pay

## 2023-10-04 ENCOUNTER — Telehealth: Payer: Self-pay | Admitting: Medical Oncology

## 2023-10-04 ENCOUNTER — Other Ambulatory Visit: Payer: Self-pay

## 2023-10-04 ENCOUNTER — Other Ambulatory Visit: Payer: Self-pay | Admitting: Internal Medicine

## 2023-10-04 ENCOUNTER — Other Ambulatory Visit (HOSPITAL_COMMUNITY): Payer: Self-pay

## 2023-10-04 DIAGNOSIS — C3492 Malignant neoplasm of unspecified part of left bronchus or lung: Secondary | ICD-10-CM

## 2023-10-04 MED ORDER — AFATINIB DIMALEATE 30 MG PO TABS
30.0000 mg | ORAL_TABLET | Freq: Every day | ORAL | 2 refills | Status: DC
  Filled 2023-10-05: qty 30, 30d supply, fill #0
  Filled 2023-11-13 – 2023-11-15 (×3): qty 30, 30d supply, fill #1
  Filled 2023-12-12 – 2023-12-19 (×2): qty 30, 30d supply, fill #2

## 2023-10-04 NOTE — Progress Notes (Signed)
 Specialty Pharmacy Refill Coordination Note  MyChart Questionnaire Submission  [name] is a 73 y.o. female contacted today regarding refills of specialty medication(s) Gilotrif .  Patient requested: (Patient-Rptd) Delivery   Pickup date: 10/17/23  Medication will be filled on 10/16/23.  This fill date is pending response to refill request from provider. Patient is aware and if they have not received fill by intended date, they must follow up with pharmacy.  Patient will be going out of town and must have supply by 7/4. Patient will be in [place] and can pick up med as long as it's ready b y 7/1. Cam following.

## 2023-10-04 NOTE — Telephone Encounter (Signed)
 Jamica reported that the endodontist declined to perform any dental work due to a lack of resources to manage potential adverse reactions, specifically noting the absence of IV capabilities. The endodontist recommended that Hamna seek evaluation and treatment at [facility].  Patient inquired about the potential ramifications of holding Gilotrif  while taking antibiotics.  Per Dr. Marguerita Shih, patient was advised that she may hold Gilotrif  for 7 days during the course of antibiotic treatment. She was instructed to begin erythromycin tomorrow.  Nilsa verbalized understanding and stated she plans to present to [facility] tomorrow as directed by her endodontist.

## 2023-10-04 NOTE — Telephone Encounter (Signed)
 Cypress reports a painful, infected tooth. She has an appointment today with Dr. Bettyjo Brunette, MS, DDS, Endodontist, to address the issue. Referral was made by her general dentist, Dr. Aldon Ambrosia.  Estle stated that Dr. Bridget Campion may consider prescribing erythromycin or dexamethasone . Per [facility] Sherel  was informed that if the provider wanted to order erythomycin then patient's Gilotrif  dose would need to be reduced for 7 days.. Patient expressed that she does not wish to reduce her Gilotrif  dose.  I advised Banessa to call back after her appointment with Dr. Bettyjo Brunette.  I contacted Dr. Margarita Shear office and requested a call back regarding any concerns or coordination

## 2023-10-04 NOTE — Telephone Encounter (Addendum)
 [name]

## 2023-10-05 ENCOUNTER — Other Ambulatory Visit (HOSPITAL_COMMUNITY): Payer: Self-pay

## 2023-10-06 ENCOUNTER — Other Ambulatory Visit (HOSPITAL_COMMUNITY): Payer: Self-pay

## 2023-10-13 ENCOUNTER — Other Ambulatory Visit: Payer: Self-pay

## 2023-10-13 ENCOUNTER — Telehealth: Payer: Self-pay | Admitting: Internal Medicine

## 2023-10-13 ENCOUNTER — Telehealth: Payer: Self-pay | Admitting: Pharmacist

## 2023-10-13 NOTE — Telephone Encounter (Signed)
 Oral Chemotherapy Pharmacist Encounter   Received phone call from patient asking when she can resume Gilotrif  (afatinib ) as she is on last dose of erythromycin oral suspension today. Patient is anxious to resume afatinib . Informed patient due to short half life of erythromycin she is OK to start afatinib  on 10/14/23.  Per patient she had dental work completed this past week along with root canal and no infection was discovered. She states she has not had any issues with tolerating the oral erythromycin suspension during the time period.   Patient also requested that her afatinib  be ready for pick up from [facility] on 10/17/23 as she has MD OV that afternoon with Dr. Sherrod. I transferred patient to the [facility] Specialty Call Center to ensure this is set up.   Patient expressed appreciation for the above and has no other questions or concerns at this time.   Asberry Macintosh, PharmD, BCPS, BCOP Hematology/Oncology Clinical Pharmacist [facility] and [facility] Oral Chemotherapy Navigation Clinics [phone] 10/13/2023 10:44 AM

## 2023-10-13 NOTE — Telephone Encounter (Signed)
 I informed [name] that her lab appointment has been re-scheduled earlier.

## 2023-10-16 ENCOUNTER — Other Ambulatory Visit (HOSPITAL_COMMUNITY): Payer: Self-pay

## 2023-10-16 ENCOUNTER — Other Ambulatory Visit: Payer: Self-pay | Admitting: Internal Medicine

## 2023-10-16 ENCOUNTER — Other Ambulatory Visit: Payer: Self-pay

## 2023-10-16 DIAGNOSIS — C3492 Malignant neoplasm of unspecified part of left bronchus or lung: Secondary | ICD-10-CM

## 2023-10-17 ENCOUNTER — Inpatient Hospital Stay: Attending: Internal Medicine | Admitting: Internal Medicine

## 2023-10-17 ENCOUNTER — Inpatient Hospital Stay: Attending: Internal Medicine

## 2023-10-17 ENCOUNTER — Ambulatory Visit (HOSPITAL_COMMUNITY)
Admission: RE | Admit: 2023-10-17 | Discharge: 2023-10-17 | Disposition: A | Discharge status: Home / Self Care | Source: Ambulatory Visit | Attending: Internal Medicine | Admitting: Internal Medicine

## 2023-10-17 ENCOUNTER — Other Ambulatory Visit (HOSPITAL_COMMUNITY): Payer: Self-pay

## 2023-10-17 ENCOUNTER — Other Ambulatory Visit

## 2023-10-17 VITALS — BP 167/63 | HR 83 | Temp 97.9°F | Resp 18 | Ht 64.5 in | Wt 133.3 lb

## 2023-10-17 DIAGNOSIS — Z9221 Personal history of antineoplastic chemotherapy: Secondary | ICD-10-CM | POA: Insufficient documentation

## 2023-10-17 DIAGNOSIS — C3490 Malignant neoplasm of unspecified part of unspecified bronchus or lung: Secondary | ICD-10-CM | POA: Insufficient documentation

## 2023-10-17 DIAGNOSIS — C3492 Malignant neoplasm of unspecified part of left bronchus or lung: Secondary | ICD-10-CM | POA: Diagnosis not present

## 2023-10-17 DIAGNOSIS — D649 Anemia, unspecified: Secondary | ICD-10-CM | POA: Insufficient documentation

## 2023-10-17 DIAGNOSIS — C3432 Malignant neoplasm of lower lobe, left bronchus or lung: Secondary | ICD-10-CM | POA: Insufficient documentation

## 2023-10-17 DIAGNOSIS — Z902 Acquired absence of lung [part of]: Secondary | ICD-10-CM | POA: Diagnosis not present

## 2023-10-17 LAB — CMP (CANCER CENTER ONLY)
ALT: 16 U/L (ref 0–44)
AST: 19 U/L (ref 15–41)
Albumin: 3.9 g/dL (ref 3.5–5.0)
Alkaline Phosphatase: 100 U/L (ref 38–126)
Anion gap: 5 (ref 5–15)
BUN: 20 mg/dL (ref 8–23)
CO2: 29 mmol/L (ref 22–32)
Calcium: 8.8 mg/dL — ABNORMAL LOW (ref 8.9–10.3)
Chloride: 103 mmol/L (ref 98–111)
Creatinine: 0.9 mg/dL (ref 0.44–1.00)
GFR, Estimated: >60 mL/min (ref >60)
Glucose, Bld: 109 mg/dL — ABNORMAL HIGH (ref 70–99)
Potassium: 4 mmol/L (ref 3.5–5.1)
Sodium: 137 mmol/L (ref 135–145)
Total Bilirubin: 0.2 mg/dL (ref 0.0–1.2)
Total Protein: 6.1 g/dL — ABNORMAL LOW (ref 6.5–8.1)

## 2023-10-17 LAB — CBC WITH DIFFERENTIAL (CANCER CENTER ONLY)
Abs Immature Granulocytes: 0.06 10*3/uL (ref 0.00–0.07)
Basophils Absolute: 0 10*3/uL (ref 0.0–0.1)
Basophils Relative: 0 %
Eosinophils Absolute: 0.1 10*3/uL (ref 0.0–0.5)
Eosinophils Relative: 1 %
HCT: 31.6 % — ABNORMAL LOW (ref 36.0–46.0)
Hemoglobin: 10.4 g/dL — ABNORMAL LOW (ref 12.0–15.0)
Immature Granulocytes: 1 %
Lymphocytes Relative: 19 %
Lymphs Abs: 1.7 10*3/uL (ref 0.7–4.0)
MCH: 31 pg (ref 26.0–34.0)
MCHC: 32.9 g/dL (ref 30.0–36.0)
MCV: 94 fL (ref 80.0–100.0)
Monocytes Absolute: 0.7 10*3/uL (ref 0.1–1.0)
Monocytes Relative: 8 %
Neutro Abs: 6.2 10*3/uL (ref 1.7–7.7)
Neutrophils Relative %: 71 %
Platelet Count: 326 10*3/uL (ref 150–400)
RBC: 3.36 MIL/uL — ABNORMAL LOW (ref 3.87–5.11)
RDW: 13.2 % (ref 11.5–15.5)
WBC Count: 8.8 10*3/uL (ref 4.0–10.5)
nRBC: 0 % (ref 0.0–0.2)

## 2023-10-17 NOTE — Progress Notes (Signed)
 [facility] [phone]   [phone]  OFFICE PROGRESS NOTE  Cheryl Andree BIRCH, MD [address] [place] [address]  DIAGNOSIS: Recurrent non-small cell lung cancer initially diagnosed as stage IIA (T2b, N0, M0) non-small cell lung cancer, adenocarcinoma diagnosed in November 2019  Molecular studies showed EGFR mutation in exon 21 (L858R) PD-L1 expression 0  Biomarker Findings on disease progression in 02/2022 Microsatellite status - MS-Stable Tumor Mutational Burden - 4 Muts/Mb Genomic Findings For a complete list of the genes assayed, please refer to the Appendix. EGFR L718V, L858R CTNNB1 D32Y SMAD4 E330K 7 Disease relevant genes with no reportable alterations: ALK, BRAF, ERBB2, KRAS, MET, RET, ROS1  Molecular studies by [id number] performed in September 2024 showed similar results  PRIOR THERAPY:  1) status post left lower lobectomy with lymph node dissection on April 25, 2018 under the care of Dr. Kerrin.  She declined adjuvant therapy 2) status post palliative radiotherapy with IMRT to the progressive pleural-based lung masses under the care of Dr. Shannon completed on February 28, 2022. 3) Osimertinib  (Tagrisso ) 80 mg p.o. daily.  First dose was on June 19, 2019.  Status post 43 months of treatment.  CURRENT THERAPY: Afatinib  30 mg p.o. daily.  First dose February 06, 2023.  Status post 9 months of treatment.  INTERVAL HISTORY: [name] 73 y.o. female returns to the clinic today for follow-up visit accompanied by her son [name].Discussed the use of AI scribe software for clinical note transcription with the patient, who gave verbal consent to proceed.  History of Present Illness [name] is a 73 year old female with recurrent non-small cell lung cancer who presents for evaluation and repeat CT scan for restaging of her disease. She is accompanied by her son, [name].  She was initially diagnosed with  non-small cell lung cancer, adenocarcinoma with a positive EGFR mutation (O141M), in November 2019. She underwent a left lower lobectomy with lymph node dissection. In November 2023, she experienced disease recurrence and received palliative radiotherapy for pleural-based lung masses in both hemithoraces, followed by treatment with Tagrisso  (osimertinib ) 80 mg daily for approximately 43 months.  Upon evidence of disease progression, repeat molecular studies revealed the persistence of the L858R mutation and a new L718V mutation. Consequently, she commenced treatment with afatinib  in October 2024 and has been on this regimen since. She feels 'pretty good' since resuming afatinib  after a brief interruption due to a dental issue.  Recently, she experienced a dental issue with an infected tooth, which required a root canal. Due to her extensive allergy list, there was concern about antibiotic use, but she was successfully treated with prednisone and erythromycin. She was off afatinib  for ten days during this period, which she described as 'miserable,' but has since resumed the medication.  She is concerned about her protein intake, as she has been eating well and gaining a few pounds. Her son, [name], is present and she discusses family plans, including an upcoming beach trip.    MEDICAL HISTORY: Past Medical History:  Diagnosis Date   Anemia    Atherosclerosis    Family history of lung cancer    GERD (gastroesophageal reflux disease)    Hemoptysis    History of radiation therapy    Left Lung- 02/15/22-02/28/22- Dr. Lynwood Shannon   Hyperlipidemia    Hypertension    IBS (irritable bowel syndrome)    Lung mass    Migraines    NSCL CA  WITH RECURRENCE 2019   2021   Spondylosis    Vitamin D deficiency     ALLERGIES:  is allergic to avocado, influenza vaccines, lisinopril , metoprolol , nexium [esomeprazole magnesium], penicillins, pork-derived products, proton pump inhibitors, sulfites, wasp venom  protein, zantac [ranitidine hcl], cefaclor, chlorhexidine , chloroxine, ciprofloxacin hcl, clarithromycin, gatifloxacin, other, sulfa antibiotics, sulfamethoxazole-trimethoprim, ambrosia artemisiifolia (ragweed) skin test, buckwheat, cat dander, dog epithelium (canis lupus familiaris), egg-derived products, fiber, fluogen [influenza virus vaccine], lentil, pollen extract, rumex acetosella (sheep's sorrel), short ragweed pollen ext, sulfur, aspartame, chocolate, dilaudid  [hydromorphone  hcl], fentanyl , ivp dye [iodinated contrast media], ketorolac, milk-related compounds, molds & smuts, oxycodone , prednisolone, shellfish allergy, soap, and tilactase.  MEDICATIONS:  Current Outpatient Medications  Medication Sig Dispense Refill   afatinib  dimaleate (GILOTRIF ) 30 MG tablet Take 1 tablet (30 mg total) by mouth daily. Take on an empty stomach 1hr before or 2hrs after meals. 30 tablet 2   Carboxymethylcellul-Glycerin (LUBRICATING EYE DROPS OP) Place 1 drop into both eyes daily as needed (dry eyes).     Cholecalciferol (VITAMIN D3) LIQD Take 2,000 Units by mouth daily.     clindamycin  (CLINDAGEL) 1 % gel Apply topically 2 (two) times daily. 60 g 0   Cyanocobalamin (VITAMIN B-12) 1000 MCG SUBL Place 1 tablet under the tongue once a week.     diltiazem  (CARDIZEM  CD) 120 MG 24 hr capsule Take 120 mg by mouth daily.     doxycycline  (VIBRA -TABS) 100 MG tablet Take 1 tablet (100 mg total) by mouth daily. 28 tablet 0   EPINEPHrine  0.3 mg/0.3 mL IJ SOAJ injection Inject 0.3 mg into the muscle as needed for anaphylaxis. (Patient not taking: Reported on 02/21/2023)     erythromycin ophthalmic ointment Place 1 Application into both eyes 2 (two) times daily.     famotidine (PEPCID) 40 MG tablet Take 40 mg by mouth daily.     LORazepam  (ATIVAN ) 0.5 MG tablet 1 tablet p.o. 15-30 minutes before thoracentesis, repeat x 1 if needed 2 tablet 0   methylPREDNISolone  (MEDROL  DOSEPAK) 4 MG TBPK tablet Use as instructed 21 tablet 0    Multiple Vitamin (MULTIVITAMIN) tablet Take 1 tablet by mouth daily.     mupirocin ointment (BACTROBAN) 2 % Apply 1 Application topically 2 (two) times daily. May take 2-3 times daily.     Omega 3-6-9 Fatty Acids (OMEGA-3 FUSION) LIQD Take by mouth.     OVER THE COUNTER MEDICATION Iron gummies.     PRESCRIPTION MEDICATION Apply 1 application topically 2 (two) times daily as needed for itching. Dr Shirleen 2019     rizatriptan (MAXALT) 10 MG tablet Take 5 mg by mouth as needed for migraine. May repeat in 2 hours if needed (Patient not taking: Reported on 02/03/2022)     No current facility-administered medications for this visit.    SURGICAL HISTORY:  Past Surgical History:  Procedure Laterality Date   BROW LIFT AND BLEPHAROPLASTY  03/15/2013   CATARACT EXTRACTION, BILATERAL  06/23/2014   CHEST TUBE INSERTION Left 05/10/2018   Procedure: CHEST TUBE INSERTION;  Surgeon: Kerrin [name] BROCKS, MD;  Location[facility] OR;  Service: Thoracic;  Laterality: Left;   COLONOSCOPY  05/2014   ESOPHAGOGASTRODUODENOSCOPY  2019   EYE SURGERY     IR THORACENTESIS ASP PLEURAL SPACE W/IMG GUIDE  05/24/2018   IR THORACENTESIS ASP PLEURAL SPACE W/IMG GUIDE  06/14/2018   IR THORACENTESIS ASP PLEURAL SPACE W/IMG GUIDE  02/02/2023   TONSILLECTOMY     VIDEO ASSISTED THORACOSCOPY (VATS)/ LOBECTOMY Left 04/25/2018  Procedure: VIDEO ASSISTED THORACOSCOPY (VATS)LEFT LOWER LOBECTOMY, NODE DISSECTION;  Surgeon: Kerrin [name] BROCKS, MD;  Location: [facility] OR;  Service: Thoracic;  Laterality: Left;   VIDEO BRONCHOSCOPY WITH ENDOBRONCHIAL NAVIGATION N/A 03/07/2018   Procedure: VIDEO BRONCHOSCOPY WITH ENDOBRONCHIAL NAVIGATION;  Surgeon: Shelah Lamar RAMAN, MD;  Location: [facility] OR;  Service: Thoracic;  Laterality: N/A;   VIDEO BRONCHOSCOPY WITH INSERTION OF INTERBRONCHIAL VALVE (IBV) N/A 05/10/2018   Procedure: VIDEO BRONCHOSCOPY WITH INSERTION OF INTERBRONCHIAL VALVE (IBV);  Surgeon: Kerrin [name] BROCKS, MD;  Location[facility] OR;  Service:  Thoracic;  Laterality: N/A;   VIDEO BRONCHOSCOPY WITH INSERTION OF INTERBRONCHIAL VALVE (IBV) N/A 06/13/2018   Procedure: VIDEO BRONCHOSCOPY WITH REMOVAL OF INTERBRONCHIAL VALVE (IBV) x 2;  Surgeon: Kerrin [name] BROCKS, MD;  Location: [facility] OR;  Service: Thoracic;  Laterality: N/A;    REVIEW OF SYSTEMS:  Constitutional: positive for fatigue Eyes: negative Ears, nose, mouth, throat, and face: negative Respiratory: negative Cardiovascular: negative Gastrointestinal: negative Genitourinary:negative Integument/breast: negative Hematologic/lymphatic: negative Musculoskeletal:negative Neurological: negative Behavioral/Psych: negative Endocrine: negative Allergic/Immunologic: negative   PHYSICAL EXAMINATION: General appearance: alert, cooperative, fatigued, and no distress Head: Normocephalic, without obvious abnormality, atraumatic Neck: no adenopathy, no JVD, supple, symmetrical, trachea midline, and thyroid not enlarged, symmetric, no tenderness/mass/nodules Lymph nodes: Cervical, supraclavicular, and axillary nodes normal. Resp: clear to auscultation bilaterally Back: symmetric, no curvature. ROM normal. No CVA tenderness. Cardio: regular rate and rhythm, S1, S2 normal, no murmur, click, rub or gallop GI: soft, non-tender; bowel sounds normal; no masses,  no organomegaly Extremities: extremities normal, atraumatic, no cyanosis or edema Neurologic: Alert and oriented X 3, normal strength and tone. Normal symmetric reflexes. Normal coordination and gait   ECOG PERFORMANCE STATUS: 1 - Symptomatic but completely ambulatory  Blood pressure (!) 167/63, pulse 83, temperature 97.9 F (36.6 C), temperature source Temporal, resp. rate 18, height 5' 4.5 (1.638 m), weight 133 lb 4.8 oz (60.5 kg), SpO2 99%.   LABORATORY DATA: Lab Results  Component Value Date   WBC 8.8 10/17/2023   HGB 10.4 (L) 10/17/2023   HCT 31.6 (L) 10/17/2023   MCV 94.0 10/17/2023   PLT 326 10/17/2023      Chemistry       Component Value Date/Time   NA 137 10/17/2023 1216   K 4.0 10/17/2023 1216   CL 103 10/17/2023 1216   CO2 29 10/17/2023 1216   BUN 20 10/17/2023 1216   CREATININE 0.90 10/17/2023 1216      Component Value Date/Time   CALCIUM  8.8 (L) 10/17/2023 1216   ALKPHOS 100 10/17/2023 1216   AST 19 10/17/2023 1216   ALT 16 10/17/2023 1216   BILITOT 0.2 10/17/2023 1216       RADIOGRAPHIC STUDIES: CT Chest Wo Contrast Result Date: 10/17/2023 CLINICAL DATA:  Non-small-cell lung cancer. Restaging. * Tracking Code: BO * EXAM: CT CHEST WITHOUT CONTRAST TECHNIQUE: Multidetector CT imaging of the chest was performed following the standard protocol without IV contrast. RADIATION DOSE REDUCTION: This exam was performed according to the departmental dose-optimization program which includes automated exposure control, adjustment of the mA and/or kV according to patient size and/or use of iterative reconstruction technique. COMPARISON:  07/04/2023.  04/04/2023. FINDINGS: Cardiovascular: The heart size is normal. No substantial pericardial effusion. Coronary artery calcification is evident. Mild atherosclerotic calcification is noted in the wall of the thoracic aorta. Mediastinum/Nodes: No mediastinal lymphadenopathy. No evidence for gross hilar lymphadenopathy although assessment is limited by the lack of intravenous contrast on the current study. Stable 9 mm soft tissue nodule  adjacent to the distal esophagus (106/2) comparing back over multiple prior studies. There is no axillary lymphadenopathy. Lungs/Pleura: Volume loss left hemithorax again noted consistent with the surgical history. Linear chronic atelectasis or scarring in the right lower lobe is stable. Scattered foci of peripheral small airway impaction noted right middle lobe. Postsurgical scarring in the parahilar left lung is stable. Chronic loculated paraspinal left pleural effusion continues to decrease, measuring 6 mm in thickness on 65/2 compared to  12 mm previously. Fluid in the deep posterior left costophrenic sulcus is similar to minimally decreased in the interval. Index pleural nodules of the left hemithorax are measured on soft tissue windows. Index pleural based nodule just deep to the lateral left third rib measured at 5 mm previously is 3 mm today on image 17/2 . Index nodule deep to the left fourth rib which was unmeasurable on the previous study shows no recurrence. Anterior pleural nodule measured previously at 7 mm is 6 mm today on image 52/2. Index nodule in the anterior mediastinum versus anteromedial left pleural reflection measured previously at 7 mm is 7 mm again today on 76/2. Index nodule seen anterior to the heart measured previously at 13 x 6 mm is 12 x 4 mm today on image 108/2. No new suspicious pleural nodule on today's study. Upper Abdomen: Stable tiny hypodensity in the dome of the liver. Imaging features most consistent with benign etiology such as a simple cyst. No evidence for adrenal nodule or mass. Tiny soft tissue nodule seen previously in the region of the esophagogastric junction are not evident today. Musculoskeletal: No worrisome lytic or sclerotic osseous abnormality. Continued further healing of the right seventh through ninth rib fractures. No worrisome lytic or sclerotic osseous abnormality. IMPRESSION: 1. Stable to mildly improved exam. No new or progressive findings on today's exam. 2. Chronic loculated paraspinal left pleural effusion continues to decrease. Fluid in the deep posterior left costophrenic sulcus is similar to minimally decreased in the interval. 3. Index pleural nodules of the left hemithorax are stable to slightly decreased in the interval. 4. Stable 9 mm soft tissue nodule adjacent to the distal esophagus comparing back over multiple prior studies. 5. Continued further healing of the right seventh through ninth rib fractures. 6.  Aortic Atherosclerosis (ICD10-I70.0). Electronically Signed   By: Camellia Candle M.D.   On: 10/17/2023 13:53     ASSESSMENT AND PLAN: This is a very pleasant 73 years old white female with a stage IIa non-small cell lung cancer, adenocarcinoma with positive EGFR mutation in exon 21 (L858R) status post left lower lobectomy with lymph node dissection under the care of Dr. Kerrin. She declined to proceed with adjuvant systemic chemotherapy or targeted therapy. The patient was on observation since her surgical resection until she had disease recurrence in February 2021 presented with pleural-based masses in the left lung with new biopsy and molecular studies positive for EGFR mutation exon 21 (L858R). The patient started treatment with Tagrisso  80 mg p.o. daily status post almost 43 months of treatment.  The patient underwent CT-guided core biopsy of one of the pleural-based left lung progressive lesion and the final pathology was consistent with metastatic adenocarcinoma. The molecular studies by [facility] and it was positive for EGFR L718V, L858R.  She has negative PD-L1 expression.  She also had molecular studies by [employer] performed recently and it showed similar results with many other none actionable mutations in lung cancer. The patient was seen by Dr. Shannon and underwent IMRT to the  progressive lesion of the left lung pleural-based masses.   She continued her treatment with Tagrisso  80 mg p.o. daily and she has been tolerating the treatment well except for the fatigue. She had evidence for disease progression on imaging studies in October 2024 this overall generalized trend of continued interval progression of her known pleural disease in the left hemithorax and left anterior medial pleural reflection versus anterior mediastinum with interval increase in the left pleural effusion that is now moderate to large in size with adjacent mild atelectasis in the lower left lung and stable soft tissue nodules in the region of the GE junction. At that time I had a  lengthy discussion with the patient about her condition. I discussed with the patient several options for management of her condition including continued treatment with Tagrisso  but adding systemic chemotherapy with carboplatin and Alimta versus discontinuing treatment with Tagrisso  and switching her to afatinib  which has activity in patient with the uncommon mutation in EGFR L718V in addition to the common L858R mutation versus discontinuing treatment with Tagrisso  and starting the patient on treatment with systemic chemotherapy in addition to Amivantamab but this is usually associated with significant hypersensitivity reaction especially during the first 2 doses. The patient was also given the option of revisiting Dr. Tobie at [facility] for discussion of the clinical trial with the [id number] oral EGFR inhibitor. After a lengthy discussion the patient decided to proceed with treatment with afatinib  and this wasgiven at a dose of 30 mg p.o. daily.  She started the first dose of this treatment on February 06, 2023.  She continues to tolerate her treatment fairly well. She had repeat CT scan of the chest performed earlier today.  I personally and independently reviewed the scan and discussed the result with the patient and her son.  Her scan showed no concerning findings for disease progression. Assessment and Plan Assessment & Plan Recurrent non-small cell lung cancer with EGFR mutation Recurrent non-small cell lung cancer, adenocarcinoma with positive EGFR mutation L858R and new mutation L718V. Initially treated with left lower lobectomy and lymph node dissection. Disease recurrence in November 2023 treated with palliative radiotherapy and Tagrisso  for 43 months. Progression led to initiation of afatinib  in October 2024. Current CT scan shows well-managed disease with some decrease in areas, indicating effective disease control. Discussed progression-free survival for afatinib , which averages 10.6  months for first-line treatment, but emphasized individual variability and previous extended response to Tagrisso . Discussed potential future mutations and treatment options, including ongoing research and new therapies. Emphasized the importance of continuing current treatment and monitoring for new mutations. Discussed that chemotherapy is less debilitating than in the past and does not typically cause alopecia. - Continue afatinib  treatment - Order repeat CT scan in 12 weeks - Monitor for new mutations - Discuss potential future treatment options as needed  Anemia Anemia is present but showing improvement. Discussed dietary protein intake as a potential factor and advised increasing protein in diet. - Increase dietary protein intake She will come back for follow-up visit in 6 weeks for evaluation and repeat blood work. She was advised to call immediately if she has any other concerning symptoms in the interval. The patient voices understanding of current disease status and treatment options and is in agreement with the current care plan. All questions were answered. The patient knows to call the clinic with any problems, questions or concerns. We can certainly see the patient much sooner if necessary. The total time spent in the appointment  was 30 minutes including review of chart and various tests results, discussions about plan of care and coordination of care plan .  Disclaimer: This note was dictated with voice recognition software. Similar sounding words can inadvertently be transcribed and may not be corrected upon review.

## 2023-10-19 ENCOUNTER — Telehealth: Payer: Self-pay | Admitting: Internal Medicine

## 2023-10-19 NOTE — Telephone Encounter (Signed)
Scheduled appointments with the patient

## 2023-10-30 ENCOUNTER — Other Ambulatory Visit: Payer: Self-pay

## 2023-10-31 ENCOUNTER — Other Ambulatory Visit: Payer: Self-pay

## 2023-10-31 NOTE — Progress Notes (Signed)
 Specialty Pharmacy Ongoing Clinical Assessment Note  [name] is a 72 y.o. female who is being followed by the specialty pharmacy service for RxSp Oncology   Patient's specialty medication(s) reviewed today: Afatinib  Dimaleate (GILOTRIF )   Missed doses in the last 4 weeks: 0   Patient/Caregiver did not have any additional questions or concerns.   Therapeutic benefit summary: Patient is achieving benefit   Adverse events/side effects summary: Experienced adverse events/side effects (dry hands and nose bleeds - both tolerable, fatigue/anemia is better, and trying to stay hydrated.)   Patient's therapy is appropriate to: Continue    Goals Addressed             This Visit's Progress    Slow Disease Progression   On track    Patient is on track. Patient will maintain adherence. Per provider notes patient tolerating therapy aside from continued tolerable diarrhea and skin rash. Per visit on 7/1, CT scan shows well-managed disease with decrease in some areas.         Follow up: 3 months  [facility]

## 2023-11-13 ENCOUNTER — Other Ambulatory Visit (HOSPITAL_COMMUNITY): Payer: Self-pay

## 2023-11-15 ENCOUNTER — Other Ambulatory Visit: Payer: Self-pay | Admitting: Pharmacy Technician

## 2023-11-15 ENCOUNTER — Other Ambulatory Visit: Payer: Self-pay

## 2023-11-15 NOTE — Progress Notes (Signed)
 Specialty Pharmacy Refill Coordination Note  Lyn CAMDYNN MARANTO is a 73 y.o. female contacted today regarding refills of specialty medication(s) Afatinib  Dimaleate (GILOTRIF )   Patient requested Delivery   Delivery date: 11/17/23   Verified address: [address]   [place] [address]   Medication will be filled on 11/16/23.

## 2023-11-16 ENCOUNTER — Other Ambulatory Visit: Payer: Self-pay

## 2023-11-20 ENCOUNTER — Telehealth: Payer: Self-pay | Admitting: Pharmacist

## 2023-11-20 NOTE — Telephone Encounter (Signed)
 Oral Oncology Pharmacist Encounter  Received phone call from patient inquiring if terbinafine 1% topical interacts with her afatinib . Informed patient terbinafine is fine to take with afatinib . No other questions or concerns at this time.   Asberry Macintosh, PharmD, BCPS, BCOP Hematology/Oncology Clinical Pharmacist [phone] 11/20/2023 8:24 AM

## 2023-11-28 ENCOUNTER — Inpatient Hospital Stay: Attending: Internal Medicine

## 2023-11-28 ENCOUNTER — Inpatient Hospital Stay (HOSPITAL_BASED_OUTPATIENT_CLINIC_OR_DEPARTMENT_OTHER): Admitting: Internal Medicine

## 2023-11-28 VITALS — BP 153/58 | HR 71 | Temp 97.1°F | Resp 16 | Ht 64.5 in | Wt 135.0 lb

## 2023-11-28 DIAGNOSIS — C3432 Malignant neoplasm of lower lobe, left bronchus or lung: Secondary | ICD-10-CM | POA: Diagnosis present

## 2023-11-28 DIAGNOSIS — C3492 Malignant neoplasm of unspecified part of left bronchus or lung: Secondary | ICD-10-CM

## 2023-11-28 DIAGNOSIS — C3490 Malignant neoplasm of unspecified part of unspecified bronchus or lung: Secondary | ICD-10-CM | POA: Diagnosis not present

## 2023-11-28 LAB — CBC WITH DIFFERENTIAL (CANCER CENTER ONLY)
Abs Immature Granulocytes: 0.04 K/uL (ref 0.00–0.07)
Basophils Absolute: 0.1 K/uL (ref 0.0–0.1)
Basophils Relative: 1 %
Eosinophils Absolute: 0.3 K/uL (ref 0.0–0.5)
Eosinophils Relative: 4 %
HCT: 31.6 % — ABNORMAL LOW (ref 36.0–46.0)
Hemoglobin: 10.2 g/dL — ABNORMAL LOW (ref 12.0–15.0)
Immature Granulocytes: 0 %
Lymphocytes Relative: 24 %
Lymphs Abs: 2.2 K/uL (ref 0.7–4.0)
MCH: 31 pg (ref 26.0–34.0)
MCHC: 32.3 g/dL (ref 30.0–36.0)
MCV: 96 fL (ref 80.0–100.0)
Monocytes Absolute: 0.7 K/uL (ref 0.1–1.0)
Monocytes Relative: 8 %
Neutro Abs: 5.6 K/uL (ref 1.7–7.7)
Neutrophils Relative %: 63 %
Platelet Count: 361 K/uL (ref 150–400)
RBC: 3.29 MIL/uL — ABNORMAL LOW (ref 3.87–5.11)
RDW: 13.2 % (ref 11.5–15.5)
WBC Count: 8.9 K/uL (ref 4.0–10.5)
nRBC: 0 % (ref 0.0–0.2)

## 2023-11-28 LAB — CMP (CANCER CENTER ONLY)
ALT: 12 U/L (ref 0–44)
AST: 19 U/L (ref 15–41)
Albumin: 4.1 g/dL (ref 3.5–5.0)
Alkaline Phosphatase: 106 U/L (ref 38–126)
Anion gap: 5 (ref 5–15)
BUN: 21 mg/dL (ref 8–23)
CO2: 31 mmol/L (ref 22–32)
Calcium: 9 mg/dL (ref 8.9–10.3)
Chloride: 102 mmol/L (ref 98–111)
Creatinine: 0.98 mg/dL (ref 0.44–1.00)
GFR, Estimated: >60 mL/min (ref >60)
Glucose, Bld: 104 mg/dL — ABNORMAL HIGH (ref 70–99)
Potassium: 3.8 mmol/L (ref 3.5–5.1)
Sodium: 138 mmol/L (ref 135–145)
Total Bilirubin: 0.3 mg/dL (ref 0.0–1.2)
Total Protein: 6.4 g/dL — ABNORMAL LOW (ref 6.5–8.1)

## 2023-11-28 NOTE — Progress Notes (Signed)
 [facility] [phone]   [phone]  OFFICE PROGRESS NOTE  [name], MD [address] [place] [address]  DIAGNOSIS: Recurrent non-small cell lung cancer initially diagnosed as stage IIA (T2b, N0, M0) non-small cell lung cancer, adenocarcinoma diagnosed in November 2019  Molecular studies showed EGFR mutation in exon 21 (L858R) PD-L1 expression 0  Biomarker Findings on disease progression in 02/2022 Microsatellite status - MS-Stable Tumor Mutational Burden - 4 Muts/Mb Genomic Findings For a complete list of the genes assayed, please refer to the Appendix. EGFR L718V, L858R CTNNB1 D32Y SMAD4 E330K 7 Disease relevant genes with no reportable alterations: ALK, BRAF, ERBB2, KRAS, MET, RET, ROS1  Molecular studies by [id number] performed in September 2024 showed similar results  PRIOR THERAPY:  1) status post left lower lobectomy with lymph node dissection on April 25, 2018 under the care of Dr. Kerrin.  She declined adjuvant therapy 2) status post palliative radiotherapy with IMRT to the progressive pleural-based lung masses under the care of Dr. Shannon completed on February 28, 2022. 3) Osimertinib  (Tagrisso ) 80 mg p.o. daily.  First dose was on June 19, 2019.  Status post 43 months of treatment.  CURRENT THERAPY: Afatinib  30 mg p.o. daily.  First dose February 06, 2023.  Status post 10 months of treatment.  INTERVAL HISTORY: [name] 73 y.o. female returns to the clinic today for follow-up visit accompanied by her son Emeline. Discussed the use of AI scribe software for clinical note transcription with the patient, who gave verbal consent to proceed.  History of Present Illness [name] is a 72 year old female with lung cancer who presents for evaluation and repeat blood work.  She has a history of lung cancer with pleural-based lung masses and experienced disease recurrence in November 2023, treated  with palliative radiotherapy. Initially, she started treatment with Tagrisso  in March 2021, which was discontinued after disease progression. Currently, she is on afatinib  30 mg orally once daily since February 06, 2023.  She experiences multiple side effects from afatinib , including fatigue, a rash under her wrist, and diarrhea. The rash, previously severe, is improving with the use of a steroid cream. Diarrhea occurs once or twice a day, and she has resumed taking Imodium once daily to manage it. During a recent antibiotic course for a tooth infection, she was off afatinib  for 10-11 days, during which all side effects resolved.  She reports nasal bleeding every morning and shortness of breath, particularly in the mornings, which improves after eating. Her hemoglobin is currently 10.2. She attributes some of her fatigue to anemia.  She is undergoing physical therapy but notes limited progress due to her lung capacity, which she estimates at 70%.  Her recent lab work shows a protein level of 6.4, which is slightly below the normal range, and a BUN level of 21, which is within the normal range. She is trying to increase her protein intake.     MEDICAL HISTORY: Past Medical History:  Diagnosis Date   Anemia    Atherosclerosis    Family history of lung cancer    GERD (gastroesophageal reflux disease)    Hemoptysis    History of radiation therapy    Left Lung- 02/15/22-02/28/22- Dr. Lynwood Shannon   Hyperlipidemia    Hypertension    IBS (irritable bowel syndrome)    Lung mass    Migraines    NSCL CA WITH RECURRENCE 2019   2021  Spondylosis    Vitamin D deficiency     ALLERGIES:  is allergic to avocado, influenza vaccines, lisinopril , metoprolol , nexium [esomeprazole magnesium], penicillins, pork-derived products, proton pump inhibitors, sulfites, wasp venom protein, zantac [ranitidine hcl], cefaclor, chlorhexidine , chloroxine, ciprofloxacin hcl, clarithromycin, gatifloxacin, other, sulfa  antibiotics, sulfamethoxazole-trimethoprim, ambrosia artemisiifolia (ragweed) skin test, buckwheat, cat dander, dog epithelium (canis lupus familiaris), egg-derived products, fiber, fluogen [influenza virus vaccine], lentil, pollen extract, rumex acetosella (sheep's sorrel), short ragweed pollen ext, sulfur, aspartame, chocolate, dilaudid  [hydromorphone  hcl], fentanyl , ivp dye [iodinated contrast media], ketorolac, milk-related compounds, molds & smuts, oxycodone , prednisolone, shellfish allergy, soap, and tilactase.  MEDICATIONS:  Current Outpatient Medications  Medication Sig Dispense Refill   afatinib  dimaleate (GILOTRIF ) 30 MG tablet Take 1 tablet (30 mg total) by mouth daily. Take on an empty stomach 1hr before or 2hrs after meals. 30 tablet 2   Carboxymethylcellul-Glycerin (LUBRICATING EYE DROPS OP) Place 1 drop into both eyes daily as needed (dry eyes).     Cholecalciferol (VITAMIN D3) LIQD Take 2,000 Units by mouth daily.     clindamycin  (CLINDAGEL) 1 % gel Apply topically 2 (two) times daily. 60 g 0   Cyanocobalamin (VITAMIN B-12) 1000 MCG SUBL Place 1 tablet under the tongue once a week.     diltiazem  (CARDIZEM  CD) 120 MG 24 hr capsule Take 120 mg by mouth daily.     doxycycline  (VIBRA -TABS) 100 MG tablet Take 1 tablet (100 mg total) by mouth daily. 28 tablet 0   EPINEPHrine  0.3 mg/0.3 mL IJ SOAJ injection Inject 0.3 mg into the muscle as needed for anaphylaxis. (Patient not taking: Reported on 02/21/2023)     erythromycin ophthalmic ointment Place 1 Application into both eyes 2 (two) times daily.     famotidine (PEPCID) 40 MG tablet Take 40 mg by mouth daily.     LORazepam  (ATIVAN ) 0.5 MG tablet 1 tablet p.o. 15-30 minutes before thoracentesis, repeat x 1 if needed 2 tablet 0   methylPREDNISolone  (MEDROL  DOSEPAK) 4 MG TBPK tablet Use as instructed 21 tablet 0   Multiple Vitamin (MULTIVITAMIN) tablet Take 1 tablet by mouth daily.     mupirocin ointment (BACTROBAN) 2 % Apply 1 Application  topically 2 (two) times daily. May take 2-3 times daily.     Omega 3-6-9 Fatty Acids (OMEGA-3 FUSION) LIQD Take by mouth.     OVER THE COUNTER MEDICATION Iron gummies.     PRESCRIPTION MEDICATION Apply 1 application topically 2 (two) times daily as needed for itching. Dr Shirleen 2019     rizatriptan (MAXALT) 10 MG tablet Take 5 mg by mouth as needed for migraine. May repeat in 2 hours if needed (Patient not taking: Reported on 02/03/2022)     No current facility-administered medications for this visit.    SURGICAL HISTORY:  Past Surgical History:  Procedure Laterality Date   BROW LIFT AND BLEPHAROPLASTY  03/15/2013   CATARACT EXTRACTION, BILATERAL  06/23/2014   CHEST TUBE INSERTION Left 05/10/2018   Procedure: CHEST TUBE INSERTION;  Surgeon: Kerrin Elspeth BROCKS, MD;  Location: [facility] OR;  Service: Thoracic;  Laterality: Left;   COLONOSCOPY  05/2014   ESOPHAGOGASTRODUODENOSCOPY  2019   EYE SURGERY     IR THORACENTESIS ASP PLEURAL SPACE W/IMG GUIDE  05/24/2018   IR THORACENTESIS ASP PLEURAL SPACE W/IMG GUIDE  06/14/2018   IR THORACENTESIS ASP PLEURAL SPACE W/IMG GUIDE  02/02/2023   TONSILLECTOMY     VIDEO ASSISTED THORACOSCOPY (VATS)/ LOBECTOMY Left 04/25/2018   Procedure: VIDEO ASSISTED THORACOSCOPY (VATS)LEFT LOWER LOBECTOMY,  NODE DISSECTION;  Surgeon: Kerrin Elspeth BROCKS, MD;  Location: [facility] OR;  Service: Thoracic;  Laterality: Left;   VIDEO BRONCHOSCOPY WITH ENDOBRONCHIAL NAVIGATION N/A 03/07/2018   Procedure: VIDEO BRONCHOSCOPY WITH ENDOBRONCHIAL NAVIGATION;  Surgeon: Shelah Lamar RAMAN, MD;  Location: [facility] OR;  Service: Thoracic;  Laterality: N/A;   VIDEO BRONCHOSCOPY WITH INSERTION OF INTERBRONCHIAL VALVE (IBV) N/A 05/10/2018   Procedure: VIDEO BRONCHOSCOPY WITH INSERTION OF INTERBRONCHIAL VALVE (IBV);  Surgeon: Kerrin Elspeth BROCKS, MD;  Location: [facility] OR;  Service: Thoracic;  Laterality: N/A;   VIDEO BRONCHOSCOPY WITH INSERTION OF INTERBRONCHIAL VALVE (IBV) N/A 06/13/2018   Procedure: VIDEO  BRONCHOSCOPY WITH REMOVAL OF INTERBRONCHIAL VALVE (IBV) x 2;  Surgeon: Kerrin Elspeth BROCKS, MD;  Location: [facility] OR;  Service: Thoracic;  Laterality: N/A;    REVIEW OF SYSTEMS:  Constitutional: positive for fatigue Eyes: negative Ears, nose, mouth, throat, and face: negative Respiratory: positive for dyspnea on exertion Cardiovascular: negative Gastrointestinal: positive for diarrhea Genitourinary:negative Integument/breast: negative Hematologic/lymphatic: negative Musculoskeletal:negative Neurological: negative Behavioral/Psych: negative Endocrine: negative Allergic/Immunologic: negative   PHYSICAL EXAMINATION: General appearance: alert, cooperative, fatigued, and no distress Head: Normocephalic, without obvious abnormality, atraumatic Neck: no adenopathy, no JVD, supple, symmetrical, trachea midline, and thyroid not enlarged, symmetric, no tenderness/mass/nodules Lymph nodes: Cervical, supraclavicular, and axillary nodes normal. Resp: clear to auscultation bilaterally Back: symmetric, no curvature. ROM normal. No CVA tenderness. Cardio: regular rate and rhythm, S1, S2 normal, no murmur, click, rub or gallop GI: soft, non-tender; bowel sounds normal; no masses,  no organomegaly Extremities: extremities normal, atraumatic, no cyanosis or edema Neurologic: Alert and oriented X 3, normal strength and tone. Normal symmetric reflexes. Normal coordination and gait   ECOG PERFORMANCE STATUS: 1 - Symptomatic but completely ambulatory  Blood pressure (!) 153/58, pulse 71, temperature (!) 97.1 F (36.2 C), temperature source Temporal, resp. rate 16, height 5' 4.5 (1.638 m), weight 135 lb (61.2 kg), SpO2 100%.   LABORATORY DATA: Lab Results  Component Value Date   WBC 8.9 11/28/2023   HGB 10.2 (L) 11/28/2023   HCT 31.6 (L) 11/28/2023   MCV 96.0 11/28/2023   PLT 361 11/28/2023      Chemistry      Component Value Date/Time   NA 137 10/17/2023 1216   K 4.0 10/17/2023 1216   CL 103  10/17/2023 1216   CO2 29 10/17/2023 1216   BUN 20 10/17/2023 1216   CREATININE 0.90 10/17/2023 1216      Component Value Date/Time   CALCIUM  8.8 (L) 10/17/2023 1216   ALKPHOS 100 10/17/2023 1216   AST 19 10/17/2023 1216   ALT 16 10/17/2023 1216   BILITOT 0.2 10/17/2023 1216       RADIOGRAPHIC STUDIES: No results found.    ASSESSMENT AND PLAN: This is a very pleasant 73 years old white female with a stage IIa non-small cell lung cancer, adenocarcinoma with positive EGFR mutation in exon 21 (L858R) status post left lower lobectomy with lymph node dissection under the care of Dr. Kerrin. She declined to proceed with adjuvant systemic chemotherapy or targeted therapy. The patient was on observation since her surgical resection until she had disease recurrence in February 2021 presented with pleural-based masses in the left lung with new biopsy and molecular studies positive for EGFR mutation exon 21 (L858R). The patient started treatment with Tagrisso  80 mg p.o. daily status post almost 43 months of treatment.  The patient underwent CT-guided core biopsy of one of the pleural-based left lung progressive lesion and the final pathology was consistent  with metastatic adenocarcinoma. The molecular studies by [facility] and it was positive for EGFR L718V, L858R.  She has negative PD-L1 expression.  She also had molecular studies by [employer] performed recently and it showed similar results with many other none actionable mutations in lung cancer. The patient was seen by Dr. Shannon and underwent IMRT to the progressive lesion of the left lung pleural-based masses.   She continued her treatment with Tagrisso  80 mg p.o. daily and she has been tolerating the treatment well except for the fatigue. She had evidence for disease progression on imaging studies in October 2024 this overall generalized trend of continued interval progression of her known pleural disease in the left hemithorax and  left anterior medial pleural reflection versus anterior mediastinum with interval increase in the left pleural effusion that is now moderate to large in size with adjacent mild atelectasis in the lower left lung and stable soft tissue nodules in the region of the GE junction. At that time I had a lengthy discussion with the patient about her condition. I discussed with the patient several options for management of her condition including continued treatment with Tagrisso  but adding systemic chemotherapy with carboplatin and Alimta versus discontinuing treatment with Tagrisso  and switching her to afatinib  which has activity in patient with the uncommon mutation in EGFR L718V in addition to the common L858R mutation versus discontinuing treatment with Tagrisso  and starting the patient on treatment with systemic chemotherapy in addition to Amivantamab but this is usually associated with significant hypersensitivity reaction especially during the first 2 doses. The patient was also given the option of revisiting Dr. Tobie at [facility] for discussion of the clinical trial with the [id number] oral EGFR inhibitor. After a lengthy discussion the patient decided to proceed with treatment with afatinib  and this wasgiven at a dose of 30 mg p.o. daily.  She started the first dose of this treatment on February 06, 2023. The patient has been tolerating this treatment fairly well except for the few episodes of diarrhea and fatigue from the anemia. Assessment and Plan Assessment & Plan Non-small cell lung cancer with pleural-based recurrence Non-small cell lung cancer with pleural-based recurrence, currently managed with afatinib  30 mg PO daily since February 06, 2023. Treatment has been ongoing for approximately 10 months. Previous treatments included Tagrisso  and palliative radiotherapy. She is experiencing side effects from afatinib  but is tolerating the treatment. A scan is planned in approximately six weeks to  assess disease status. Discussion included potential future treatment options such as chemotherapy and Dattroway if afatinib  ceases to be effective. Afatinib  has shown variable duration of effectiveness, with some patients on it for several years. 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 The anemia is not causing significant concern at this time.  Fatigue secondary to anemia and cancer therapy Fatigue likely secondary to anemia and ongoing cancer therapy with afatinib . She reports fatigue but is managing with physical therapy. - Continue physical therapy  Rash secondary to afatinib  therapy Rash under the wrist secondary to afatinib  therapy, previously severe but improving with the use of a steroid cream prescribed by another physician. - Continue using steroid cream as prescribed  Diarrhea secondary to afatinib  therapy Diarrhea secondary to afatinib  therapy, occurring once or twice daily. She is managing symptoms with Imodium. - Continue Imodium as needed for diarrhea  Epistaxis secondary to afatinib  therapy Epistaxis secondary to afatinib  therapy, occurring every morning but improving.  Shortness of breath Shortness of breath, particularly in the morning, likely related to anemia and reduced lung capacity. Symptoms improve with activity and food intake. The patient was advised to call immediately if she has any concerning symptoms in the interval.  The patient voices understanding of current disease status and treatment options and is in agreement with the current care plan. All questions were answered. The patient knows to call the clinic with any problems, questions or concerns. We can certainly see the patient much sooner if necessary. The total time spent in the appointment was 30 minutes including review of  chart and various tests results, discussions about plan of care and coordination of care plan .  Disclaimer: This note was dictated with voice recognition software. Similar sounding words can inadvertently be transcribed and may not be corrected upon review.

## 2023-11-28 NOTE — Progress Notes (Signed)
 [facility] [phone]   [phone]  OFFICE PROGRESS NOTE  Cheryl Andree BIRCH, MD [address] [place] [address]  DIAGNOSIS: Recurrent non-small cell lung cancer initially diagnosed as stage IIA (T2b, N0, M0) non-small cell lung cancer, adenocarcinoma diagnosed in November 2019  Molecular studies showed EGFR mutation in exon 21 (L858R) PD-L1 expression 0  Biomarker Findings on disease progression in 02/2022 Microsatellite status - MS-Stable Tumor Mutational Burden - 4 Muts/Mb Genomic Findings For a complete list of the genes assayed, please refer to the Appendix. EGFR L718V, L858R CTNNB1 D32Y SMAD4 E330K 7 Disease relevant genes with no reportable alterations: ALK, BRAF, ERBB2, KRAS, MET, RET, ROS1  Molecular studies by [id number] performed in September 2024 showed similar results  PRIOR THERAPY:  1) status post left lower lobectomy with lymph node dissection on April 25, 2018 under the care of Dr. Kerrin.  She declined adjuvant therapy 2) status post palliative radiotherapy with IMRT to the progressive pleural-based lung masses under the care of Dr. Shannon completed on February 28, 2022. 3) Osimertinib  (Tagrisso ) 80 mg p.o. daily.  First dose was on June 19, 2019.  Status post 43 months of treatment.  CURRENT THERAPY: Afatinib  30 mg p.o. daily.  First dose February 06, 2023.  Status post 10 months of treatment.  INTERVAL HISTORY: [name] 73 y.o. female returns to the clinic today for follow-up visit accompanied by her son [name]. Discussed the use of AI scribe software for clinical note transcription with the patient, who gave verbal consent to proceed.  History of Present Illness [name] is a 73 year old female with lung cancer who presents for evaluation and repeat blood work.  She has a history of lung cancer with pleural-based lung masses and experienced disease recurrence in November 2023, treated  with palliative radiotherapy. Initially, she started treatment with Tagrisso  in March 2021, which was discontinued after disease progression. Currently, she is on afatinib  30 mg orally once daily since February 06, 2023.  She experiences multiple side effects from afatinib , including fatigue, a rash under her wrist, and diarrhea. The rash, previously severe, is improving with the use of a steroid cream. Diarrhea occurs once or twice a day, and she has resumed taking Imodium once daily to manage it. During a recent antibiotic course for a tooth infection, she was off afatinib  for 10-11 days, during which all side effects resolved.  She reports nasal bleeding every morning and shortness of breath, particularly in the mornings, which improves after eating. Her hemoglobin is currently 10.2. She attributes some of her fatigue to anemia.  She is undergoing physical therapy but notes limited progress due to her lung capacity, which she estimates at 70%.  Her recent lab work shows a protein level of 6.4, which is slightly below the normal range, and a BUN level of 21, which is within the normal range. She is trying to increase her protein intake.     MEDICAL HISTORY: Past Medical History:  Diagnosis Date   Anemia    Atherosclerosis    Family history of lung cancer    GERD (gastroesophageal reflux disease)    Hemoptysis    History of radiation therapy    Left Lung- 02/15/22-02/28/22- Dr. Lynwood Shannon   Hyperlipidemia    Hypertension    IBS (irritable bowel syndrome)    Lung mass    Migraines    NSCL CA WITH RECURRENCE 2019   2021  Spondylosis    Vitamin D deficiency     ALLERGIES:  is allergic to avocado, influenza vaccines, lisinopril , metoprolol , nexium [esomeprazole magnesium], penicillins, pork-derived products, proton pump inhibitors, sulfites, wasp venom protein, zantac [ranitidine hcl], cefaclor, chlorhexidine , chloroxine, ciprofloxacin hcl, clarithromycin, gatifloxacin, other, sulfa  antibiotics, sulfamethoxazole-trimethoprim, ambrosia artemisiifolia (ragweed) skin test, buckwheat, cat dander, dog epithelium (canis lupus familiaris), egg-derived products, fiber, fluogen [influenza virus vaccine], lentil, pollen extract, rumex acetosella (sheep's sorrel), short ragweed pollen ext, sulfur, aspartame, chocolate, dilaudid  [hydromorphone  hcl], fentanyl , ivp dye [iodinated contrast media], ketorolac, milk-related compounds, molds & smuts, oxycodone , prednisolone, shellfish allergy, soap, and tilactase.  MEDICATIONS:  Current Outpatient Medications  Medication Sig Dispense Refill   afatinib  dimaleate (GILOTRIF ) 30 MG tablet Take 1 tablet (30 mg total) by mouth daily. Take on an empty stomach 1hr before or 2hrs after meals. 30 tablet 2   Carboxymethylcellul-Glycerin (LUBRICATING EYE DROPS OP) Place 1 drop into both eyes daily as needed (dry eyes).     Cholecalciferol (VITAMIN D3) LIQD Take 2,000 Units by mouth daily.     clindamycin  (CLINDAGEL) 1 % gel Apply topically 2 (two) times daily. 60 g 0   Cyanocobalamin (VITAMIN B-12) 1000 MCG SUBL Place 1 tablet under the tongue once a week.     diltiazem  (CARDIZEM  CD) 120 MG 24 hr capsule Take 120 mg by mouth daily.     doxycycline  (VIBRA -TABS) 100 MG tablet Take 1 tablet (100 mg total) by mouth daily. 28 tablet 0   EPINEPHrine  0.3 mg/0.3 mL IJ SOAJ injection Inject 0.3 mg into the muscle as needed for anaphylaxis. (Patient not taking: Reported on 02/21/2023)     erythromycin ophthalmic ointment Place 1 Application into both eyes 2 (two) times daily.     famotidine (PEPCID) 40 MG tablet Take 40 mg by mouth daily.     LORazepam  (ATIVAN ) 0.5 MG tablet 1 tablet p.o. 15-30 minutes before thoracentesis, repeat x 1 if needed 2 tablet 0   methylPREDNISolone  (MEDROL  DOSEPAK) 4 MG TBPK tablet Use as instructed 21 tablet 0   Multiple Vitamin (MULTIVITAMIN) tablet Take 1 tablet by mouth daily.     mupirocin ointment (BACTROBAN) 2 % Apply 1 Application  topically 2 (two) times daily. May take 2-3 times daily.     Omega 3-6-9 Fatty Acids (OMEGA-3 FUSION) LIQD Take by mouth.     OVER THE COUNTER MEDICATION Iron gummies.     PRESCRIPTION MEDICATION Apply 1 application topically 2 (two) times daily as needed for itching. Dr Shirleen 2019     rizatriptan (MAXALT) 10 MG tablet Take 5 mg by mouth as needed for migraine. May repeat in 2 hours if needed (Patient not taking: Reported on 02/03/2022)     No current facility-administered medications for this visit.    SURGICAL HISTORY:  Past Surgical History:  Procedure Laterality Date   BROW LIFT AND BLEPHAROPLASTY  03/15/2013   CATARACT EXTRACTION, BILATERAL  06/23/2014   CHEST TUBE INSERTION Left 05/10/2018   Procedure: CHEST TUBE INSERTION;  Surgeon: Kerrin Elspeth BROCKS, MD;  Location: [facility] OR;  Service: Thoracic;  Laterality: Left;   COLONOSCOPY  05/2014   ESOPHAGOGASTRODUODENOSCOPY  2019   EYE SURGERY     IR THORACENTESIS ASP PLEURAL SPACE W/IMG GUIDE  05/24/2018   IR THORACENTESIS ASP PLEURAL SPACE W/IMG GUIDE  06/14/2018   IR THORACENTESIS ASP PLEURAL SPACE W/IMG GUIDE  02/02/2023   TONSILLECTOMY     VIDEO ASSISTED THORACOSCOPY (VATS)/ LOBECTOMY Left 04/25/2018   Procedure: VIDEO ASSISTED THORACOSCOPY (VATS)LEFT LOWER LOBECTOMY,  NODE DISSECTION;  Surgeon: Kerrin Elspeth BROCKS, MD;  Location: [facility] OR;  Service: Thoracic;  Laterality: Left;   VIDEO BRONCHOSCOPY WITH ENDOBRONCHIAL NAVIGATION N/A 03/07/2018   Procedure: VIDEO BRONCHOSCOPY WITH ENDOBRONCHIAL NAVIGATION;  Surgeon: Shelah Lamar RAMAN, MD;  Location: [facility] OR;  Service: Thoracic;  Laterality: N/A;   VIDEO BRONCHOSCOPY WITH INSERTION OF INTERBRONCHIAL VALVE (IBV) N/A 05/10/2018   Procedure: VIDEO BRONCHOSCOPY WITH INSERTION OF INTERBRONCHIAL VALVE (IBV);  Surgeon: Kerrin Elspeth BROCKS, MD;  Location: [facility] OR;  Service: Thoracic;  Laterality: N/A;   VIDEO BRONCHOSCOPY WITH INSERTION OF INTERBRONCHIAL VALVE (IBV) N/A 06/13/2018   Procedure: VIDEO  BRONCHOSCOPY WITH REMOVAL OF INTERBRONCHIAL VALVE (IBV) x 2;  Surgeon: Kerrin Elspeth BROCKS, MD;  Location: [facility] OR;  Service: Thoracic;  Laterality: N/A;    REVIEW OF SYSTEMS:  Constitutional: positive for fatigue Eyes: negative Ears, nose, mouth, throat, and face: negative Respiratory: positive for dyspnea on exertion Cardiovascular: negative Gastrointestinal: positive for diarrhea Genitourinary:negative Integument/breast: negative Hematologic/lymphatic: negative Musculoskeletal:negative Neurological: negative Behavioral/Psych: negative Endocrine: negative Allergic/Immunologic: negative   PHYSICAL EXAMINATION: General appearance: alert, cooperative, fatigued, and no distress Head: Normocephalic, without obvious abnormality, atraumatic Neck: no adenopathy, no JVD, supple, symmetrical, trachea midline, and thyroid not enlarged, symmetric, no tenderness/mass/nodules Lymph nodes: Cervical, supraclavicular, and axillary nodes normal. Resp: clear to auscultation bilaterally Back: symmetric, no curvature. ROM normal. No CVA tenderness. Cardio: regular rate and rhythm, S1, S2 normal, no murmur, click, rub or gallop GI: soft, non-tender; bowel sounds normal; no masses,  no organomegaly Extremities: extremities normal, atraumatic, no cyanosis or edema Neurologic: Alert and oriented X 3, normal strength and tone. Normal symmetric reflexes. Normal coordination and gait   ECOG PERFORMANCE STATUS: 1 - Symptomatic but completely ambulatory  Blood pressure (!) 153/58, pulse 71, temperature (!) 97.1 F (36.2 C), temperature source Temporal, resp. rate 16, height 5' 4.5 (1.638 m), weight 135 lb (61.2 kg), SpO2 100%.   LABORATORY DATA: Lab Results  Component Value Date   WBC 8.9 11/28/2023   HGB 10.2 (L) 11/28/2023   HCT 31.6 (L) 11/28/2023   MCV 96.0 11/28/2023   PLT 361 11/28/2023      Chemistry      Component Value Date/Time   NA 137 10/17/2023 1216   K 4.0 10/17/2023 1216   CL 103  10/17/2023 1216   CO2 29 10/17/2023 1216   BUN 20 10/17/2023 1216   CREATININE 0.90 10/17/2023 1216      Component Value Date/Time   CALCIUM  8.8 (L) 10/17/2023 1216   ALKPHOS 100 10/17/2023 1216   AST 19 10/17/2023 1216   ALT 16 10/17/2023 1216   BILITOT 0.2 10/17/2023 1216       RADIOGRAPHIC STUDIES: No results found.    ASSESSMENT AND PLAN: This is a very pleasant 73 years old white female with a stage IIa non-small cell lung cancer, adenocarcinoma with positive EGFR mutation in exon 21 (L858R) status post left lower lobectomy with lymph node dissection under the care of Dr. Kerrin. She declined to proceed with adjuvant systemic chemotherapy or targeted therapy. The patient was on observation since her surgical resection until she had disease recurrence in February 2021 presented with pleural-based masses in the left lung with new biopsy and molecular studies positive for EGFR mutation exon 21 (L858R). The patient started treatment with Tagrisso  80 mg p.o. daily status post almost 43 months of treatment.  The patient underwent CT-guided core biopsy of one of the pleural-based left lung progressive lesion and the final pathology was consistent  with metastatic adenocarcinoma. The molecular studies by [facility] and it was positive for EGFR L718V, L858R.  She has negative PD-L1 expression.  She also had molecular studies by [employer] performed recently and it showed similar results with many other none actionable mutations in lung cancer. The patient was seen by Dr. Shannon and underwent IMRT to the progressive lesion of the left lung pleural-based masses.   She continued her treatment with Tagrisso  80 mg p.o. daily and she has been tolerating the treatment well except for the fatigue. She had evidence for disease progression on imaging studies in October 2024 this overall generalized trend of continued interval progression of her known pleural disease in the left hemithorax and  left anterior medial pleural reflection versus anterior mediastinum with interval increase in the left pleural effusion that is now moderate to large in size with adjacent mild atelectasis in the lower left lung and stable soft tissue nodules in the region of the GE junction. At that time I had a lengthy discussion with the patient about her condition. I discussed with the patient several options for management of her condition including continued treatment with Tagrisso  but adding systemic chemotherapy with carboplatin and Alimta versus discontinuing treatment with Tagrisso  and switching her to afatinib  which has activity in patient with the uncommon mutation in EGFR L718V in addition to the common L858R mutation versus discontinuing treatment with Tagrisso  and starting the patient on treatment with systemic chemotherapy in addition to Amivantamab but this is usually associated with significant hypersensitivity reaction especially during the first 2 doses. The patient was also given the option of revisiting Dr. Tobie at [facility] for discussion of the clinical trial with the [id number] oral EGFR inhibitor. After a lengthy discussion the patient decided to proceed with treatment with afatinib  and this wasgiven at a dose of 30 mg p.o. daily.  She started the first dose of this treatment on February 06, 2023. The patient has been tolerating this treatment fairly well except for the few episodes of diarrhea and fatigue from the anemia. Assessment and Plan Assessment & Plan Non-small cell lung cancer with pleural-based recurrence Non-small cell lung cancer with pleural-based recurrence, currently managed with afatinib  30 mg PO daily since February 06, 2023. Treatment has been ongoing for approximately 10 months. Previous treatments included Tagrisso  and palliative radiotherapy. She is experiencing side effects from afatinib  but is tolerating the treatment. A scan is planned in approximately six weeks to  assess disease status. Discussion included potential future treatment options such as chemotherapy and Dattroway if afatinib  ceases to be effective. Afatinib  has shown variable duration of effectiveness, with some patients on it for several years. 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 The anemia is not causing significant concern at this time.  Fatigue secondary to anemia and cancer therapy Fatigue likely secondary to anemia and ongoing cancer therapy with afatinib . She reports fatigue but is managing with physical therapy. - Continue physical therapy  Rash secondary to afatinib  therapy Rash under the wrist secondary to afatinib  therapy, previously severe but improving with the use of a steroid cream prescribed by another physician. - Continue using steroid cream as prescribed  Diarrhea secondary to afatinib  therapy Diarrhea secondary to afatinib  therapy, occurring once or twice daily. She is managing symptoms with Imodium. - Continue Imodium as needed for diarrhea  Epistaxis secondary to afatinib  therapy Epistaxis secondary to afatinib  therapy, occurring every morning but improving.  Shortness of breath Shortness of breath, particularly in the morning, likely related to anemia and reduced lung capacity. Symptoms improve with activity and food intake. The patient was advised to call immediately if she has any concerning symptoms in the interval.  The patient voices understanding of current disease status and treatment options and is in agreement with the current care plan. All questions were answered. The patient knows to call the clinic with any problems, questions or concerns. We can certainly see the patient much sooner if necessary. The total time spent in the appointment was 30 minutes including review of  chart and various tests results, discussions about plan of care and coordination of care plan .  Disclaimer: This note was dictated with voice recognition software. Similar sounding words can inadvertently be transcribed and may not be corrected upon review.

## 2023-12-12 ENCOUNTER — Other Ambulatory Visit: Payer: Self-pay

## 2023-12-19 ENCOUNTER — Other Ambulatory Visit: Payer: Self-pay

## 2023-12-19 NOTE — Progress Notes (Signed)
 Specialty Pharmacy Refill Coordination Note  [name] is a 73 y.o. female contacted today regarding refills of specialty medication(s) Afatinib  Dimaleate (GILOTRIF )   Patient requested Delivery   Delivery date: 12/21/23   Verified address: [address]   [place] [address]   Medication will be filled on 09.03.25.

## 2024-01-12 ENCOUNTER — Other Ambulatory Visit: Payer: Self-pay | Admitting: Internal Medicine

## 2024-01-12 ENCOUNTER — Encounter (INDEPENDENT_AMBULATORY_CARE_PROVIDER_SITE_OTHER): Payer: Self-pay

## 2024-01-12 ENCOUNTER — Other Ambulatory Visit: Payer: Self-pay

## 2024-01-12 MED ORDER — AFATINIB DIMALEATE 30 MG PO TABS
30.0000 mg | ORAL_TABLET | Freq: Every day | ORAL | 2 refills | Status: DC
  Filled 2024-01-12: qty 30, 30d supply, fill #0
  Filled 2024-02-12: qty 30, 30d supply, fill #1
  Filled 2024-03-06 – 2024-03-13 (×2): qty 30, 30d supply, fill #2

## 2024-01-12 NOTE — Progress Notes (Signed)
 Specialty Pharmacy Refill Coordination Note  [name] is a 73 y.o. female contacted today regarding refills of specialty medication(s) Afatinib  Dimaleate (GILOTRIF )   Patient requested (Patient-Rptd) Pickup at [facility] date: (Patient-Rptd) 01/16/24   Medication will be filled on 01/15/24.

## 2024-01-15 ENCOUNTER — Other Ambulatory Visit: Payer: Self-pay

## 2024-01-16 ENCOUNTER — Ambulatory Visit (HOSPITAL_COMMUNITY)
Admission: RE | Admit: 2024-01-16 | Discharge: 2024-01-16 | Disposition: A | Discharge status: Home / Self Care | Source: Ambulatory Visit | Attending: Internal Medicine | Admitting: Internal Medicine

## 2024-01-16 ENCOUNTER — Inpatient Hospital Stay (HOSPITAL_BASED_OUTPATIENT_CLINIC_OR_DEPARTMENT_OTHER): Admitting: Internal Medicine

## 2024-01-16 ENCOUNTER — Inpatient Hospital Stay: Attending: Internal Medicine

## 2024-01-16 ENCOUNTER — Other Ambulatory Visit (HOSPITAL_COMMUNITY): Payer: Self-pay

## 2024-01-16 ENCOUNTER — Other Ambulatory Visit: Payer: Self-pay

## 2024-01-16 VITALS — BP 138/86 | HR 84 | Temp 97.3°F | Resp 17 | Ht 64.5 in | Wt 136.4 lb

## 2024-01-16 DIAGNOSIS — R600 Localized edema: Secondary | ICD-10-CM | POA: Diagnosis not present

## 2024-01-16 DIAGNOSIS — D6481 Anemia due to antineoplastic chemotherapy: Secondary | ICD-10-CM | POA: Insufficient documentation

## 2024-01-16 DIAGNOSIS — R197 Diarrhea, unspecified: Secondary | ICD-10-CM | POA: Diagnosis not present

## 2024-01-16 DIAGNOSIS — T50905D Adverse effect of unspecified drugs, medicaments and biological substances, subsequent encounter: Secondary | ICD-10-CM | POA: Diagnosis not present

## 2024-01-16 DIAGNOSIS — C3432 Malignant neoplasm of lower lobe, left bronchus or lung: Secondary | ICD-10-CM | POA: Insufficient documentation

## 2024-01-16 DIAGNOSIS — R530 Neoplastic (malignant) related fatigue: Secondary | ICD-10-CM | POA: Diagnosis not present

## 2024-01-16 DIAGNOSIS — Z902 Acquired absence of lung [part of]: Secondary | ICD-10-CM | POA: Diagnosis not present

## 2024-01-16 DIAGNOSIS — R21 Rash and other nonspecific skin eruption: Secondary | ICD-10-CM | POA: Insufficient documentation

## 2024-01-16 DIAGNOSIS — C3492 Malignant neoplasm of unspecified part of left bronchus or lung: Secondary | ICD-10-CM

## 2024-01-16 DIAGNOSIS — C3490 Malignant neoplasm of unspecified part of unspecified bronchus or lung: Secondary | ICD-10-CM

## 2024-01-16 DIAGNOSIS — K219 Gastro-esophageal reflux disease without esophagitis: Secondary | ICD-10-CM | POA: Diagnosis not present

## 2024-01-16 LAB — CBC WITH DIFFERENTIAL (CANCER CENTER ONLY)
Abs Immature Granulocytes: 0.06 K/uL (ref 0.00–0.07)
Basophils Absolute: 0.1 K/uL (ref 0.0–0.1)
Basophils Relative: 1 %
Eosinophils Absolute: 0.3 K/uL (ref 0.0–0.5)
Eosinophils Relative: 3 %
HCT: 31 % — ABNORMAL LOW (ref 36.0–46.0)
Hemoglobin: 10.1 g/dL — ABNORMAL LOW (ref 12.0–15.0)
Immature Granulocytes: 1 %
Lymphocytes Relative: 24 %
Lymphs Abs: 2.1 K/uL (ref 0.7–4.0)
MCH: 31.4 pg (ref 26.0–34.0)
MCHC: 32.6 g/dL (ref 30.0–36.0)
MCV: 96.3 fL (ref 80.0–100.0)
Monocytes Absolute: 0.8 K/uL (ref 0.1–1.0)
Monocytes Relative: 9 %
Neutro Abs: 5.7 K/uL (ref 1.7–7.7)
Neutrophils Relative %: 62 %
Platelet Count: 376 K/uL (ref 150–400)
RBC: 3.22 MIL/uL — ABNORMAL LOW (ref 3.87–5.11)
RDW: 12.7 % (ref 11.5–15.5)
WBC Count: 9 K/uL (ref 4.0–10.5)
nRBC: 0 % (ref 0.0–0.2)

## 2024-01-16 LAB — CMP (CANCER CENTER ONLY)
ALT: 11 U/L (ref 0–44)
AST: 19 U/L (ref 15–41)
Albumin: 3.8 g/dL (ref 3.5–5.0)
Alkaline Phosphatase: 86 U/L (ref 38–126)
Anion gap: 4 — ABNORMAL LOW (ref 5–15)
BUN: 21 mg/dL (ref 8–23)
CO2: 30 mmol/L (ref 22–32)
Calcium: 8.8 mg/dL — ABNORMAL LOW (ref 8.9–10.3)
Chloride: 103 mmol/L (ref 98–111)
Creatinine: 0.94 mg/dL (ref 0.44–1.00)
GFR, Estimated: >60 mL/min (ref >60)
Glucose, Bld: 97 mg/dL (ref 70–99)
Potassium: 3.7 mmol/L (ref 3.5–5.1)
Sodium: 137 mmol/L (ref 135–145)
Total Bilirubin: 0.3 mg/dL (ref 0.0–1.2)
Total Protein: 6 g/dL — ABNORMAL LOW (ref 6.5–8.1)

## 2024-01-16 NOTE — Progress Notes (Signed)
 [facility] [phone]   [phone]  OFFICE PROGRESS NOTE  [name], MD [address] [place] [address]  DIAGNOSIS: Recurrent non-small cell lung cancer initially diagnosed as stage IIA (T2b, N0, M0) non-small cell lung cancer, adenocarcinoma diagnosed in November 2019  Molecular studies showed EGFR mutation in exon 21 (L858R) PD-L1 expression 0  Biomarker Findings on disease progression in 02/2022 Microsatellite status - MS-Stable Tumor Mutational Burden - 4 Muts/Mb Genomic Findings For a complete list of the genes assayed, please refer to the Appendix. EGFR L718V, L858R CTNNB1 D32Y SMAD4 E330K 7 Disease relevant genes with no reportable alterations: ALK, BRAF, ERBB2, KRAS, MET, RET, ROS1  Molecular studies by [id number] performed in September 2024 showed similar results  PRIOR THERAPY:  1) status post left lower lobectomy with lymph node dissection on April 25, 2018 under the care of Dr. Kerrin.  She declined adjuvant therapy 2) status post palliative radiotherapy with IMRT to the progressive pleural-based lung masses under the care of Dr. Shannon completed on February 28, 2022. 3) Osimertinib  (Tagrisso ) 80 mg p.o. daily.  First dose was on June 19, 2019.  Status post 43 months of treatment.  CURRENT THERAPY: Afatinib  30 mg p.o. daily.  First dose February 06, 2023.  Status post 11 months of treatment.  INTERVAL HISTORY: [name] 73 y.o. female returns to the clinic today for follow-up visit accompanied by her son [name]. Discussed the use of AI scribe software for clinical note transcription with the patient, who gave verbal consent to proceed.  History of Present Illness [name] is a 73 year old female with recurrent non-small cell lung cancer who presents for evaluation and repeat CT scan for restaging of her disease. She is accompanied by her son, [name].  She has a history of recurrent  non-small cell lung cancer, adenocarcinoma, initially diagnosed as stage 2A in November 2019. She underwent a left lower lobectomy with lymph node dissection. The disease recurred, and she received palliative radiotherapy to the progressive pleural base. 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 Since February 06, 2023, she has been on afatinib  30 mg daily.  She experiences side effects from her current treatment, including diarrhea approximately once every four days, which resolves with Imodium. Dairy products exacerbate the diarrhea, and she is careful with her diet. Itchy bumps around her mouth develop intermittently, which she treats with hydrocortisone cream, leading to resolution after a week or two.  She experiences constant reflux and heartburn, which persists despite taking Pepcid. She occasionally uses Pepto Bismol for relief. She has a history of an allergic reaction to Nexium, which limits her treatment options for reflux. She has not seen a gastroenterologist recently, with her last colonoscopy being 8-9 years ago.  She mentions occasional swelling around her ankles. She also experiences fatigue and shortness of breath.  Her nutrition is reportedly excellent, and she takes vitamins and iron supplements. Despite her symptoms, she maintains a good weight and has gained one pound since her last visit.   MEDICAL HISTORY: Past Medical History:  Diagnosis Date   Anemia    Atherosclerosis    Family history of lung cancer    GERD (gastroesophageal reflux disease)    Hemoptysis  History of radiation therapy    Left Lung- 02/15/22-02/28/22- Dr. Lynwood Nasuti   Hyperlipidemia    Hypertension    IBS (irritable bowel syndrome)    Lung mass    Migraines    NSCL CA WITH  RECURRENCE 2019   2021   Spondylosis    Vitamin D deficiency     ALLERGIES:  is allergic to avocado, influenza vaccines, lisinopril , metoprolol , nexium [esomeprazole magnesium], penicillins, pork-derived products, proton pump inhibitors, sulfites, wasp venom protein, zantac [ranitidine hcl], cefaclor, chlorhexidine , chloroxine, ciprofloxacin hcl, clarithromycin, gatifloxacin, other, sulfa antibiotics, sulfamethoxazole-trimethoprim, ambrosia artemisiifolia (ragweed) skin test, buckwheat, cat dander, dog epithelium (canis lupus familiaris), egg-derived products, fiber, fluogen [influenza virus vaccine], lentil, pollen extract, rumex acetosella (sheep's sorrel), short ragweed pollen ext, sulfur, aspartame, chocolate, dilaudid  [hydromorphone  hcl], fentanyl , ivp dye [iodinated contrast media], ketorolac, milk-related compounds, molds & smuts, oxycodone , prednisolone, shellfish allergy, soap, and tilactase.  MEDICATIONS:  Current Outpatient Medications  Medication Sig Dispense Refill   afatinib  dimaleate (GILOTRIF ) 30 MG tablet Take 1 tablet (30 mg total) by mouth daily. Take on an empty stomach 1hr before or 2hrs after meals. 30 tablet 2   Carboxymethylcellul-Glycerin (LUBRICATING EYE DROPS OP) Place 1 drop into both eyes daily as needed (dry eyes).     Cholecalciferol (VITAMIN D3) LIQD Take 2,000 Units by mouth daily.     clindamycin  (CLINDAGEL) 1 % gel Apply topically 2 (two) times daily. 60 g 0   Cyanocobalamin (VITAMIN B-12) 1000 MCG SUBL Place 1 tablet under the tongue once a week.     diltiazem  (CARDIZEM  CD) 120 MG 24 hr capsule Take 120 mg by mouth daily.     doxycycline  (VIBRA -TABS) 100 MG tablet Take 1 tablet (100 mg total) by mouth daily. 28 tablet 0   EPINEPHrine  0.3 mg/0.3 mL IJ SOAJ injection Inject 0.3 mg into the muscle as needed for anaphylaxis. (Patient not taking: Reported on 02/21/2023)     erythromycin ophthalmic ointment Place 1 Application into both eyes 2 (two) times daily.      famotidine (PEPCID) 40 MG tablet Take 40 mg by mouth daily.     LORazepam  (ATIVAN ) 0.5 MG tablet 1 tablet p.o. 15-30 minutes before thoracentesis, repeat x 1 if needed 2 tablet 0   methylPREDNISolone  (MEDROL  DOSEPAK) 4 MG TBPK tablet Use as instructed 21 tablet 0   Multiple Vitamin (MULTIVITAMIN) tablet Take 1 tablet by mouth daily.     mupirocin ointment (BACTROBAN) 2 % Apply 1 Application topically 2 (two) times daily. May take 2-3 times daily.     Omega 3-6-9 Fatty Acids (OMEGA-3 FUSION) LIQD Take by mouth.     OVER THE COUNTER MEDICATION Iron gummies.     PRESCRIPTION MEDICATION Apply 1 application topically 2 (two) times daily as needed for itching. Dr Shirleen 2019     rizatriptan (MAXALT) 10 MG tablet Take 5 mg by mouth as needed for migraine. May repeat in 2 hours if needed (Patient not taking: Reported on 02/03/2022)     No current facility-administered medications for this visit.    SURGICAL HISTORY:  Past Surgical History:  Procedure Laterality Date   BROW LIFT AND BLEPHAROPLASTY  03/15/2013   CATARACT EXTRACTION, BILATERAL  06/23/2014   CHEST TUBE INSERTION Left 05/10/2018   Procedure: CHEST TUBE INSERTION;  Surgeon: Kerrin Elspeth BROCKS, MD;  Location: [facility] OR;  Service: Thoracic;  Laterality: Left;   COLONOSCOPY  05/2014   ESOPHAGOGASTRODUODENOSCOPY  2019   EYE SURGERY     IR THORACENTESIS ASP PLEURAL SPACE  W/IMG GUIDE  05/24/2018   IR THORACENTESIS ASP PLEURAL SPACE W/IMG GUIDE  06/14/2018   IR THORACENTESIS ASP PLEURAL SPACE W/IMG GUIDE  02/02/2023   TONSILLECTOMY     VIDEO ASSISTED THORACOSCOPY (VATS)/ LOBECTOMY Left 04/25/2018   Procedure: VIDEO ASSISTED THORACOSCOPY (VATS)LEFT LOWER LOBECTOMY, NODE DISSECTION;  Surgeon: Kerrin Elspeth BROCKS, MD;  Location: [facility] OR;  Service: Thoracic;  Laterality: Left;   VIDEO BRONCHOSCOPY WITH ENDOBRONCHIAL NAVIGATION N/A 03/07/2018   Procedure: VIDEO BRONCHOSCOPY WITH ENDOBRONCHIAL NAVIGATION;  Surgeon: Shelah Lamar RAMAN, MD;  Location: [facility]  OR;  Service: Thoracic;  Laterality: N/A;   VIDEO BRONCHOSCOPY WITH INSERTION OF INTERBRONCHIAL VALVE (IBV) N/A 05/10/2018   Procedure: VIDEO BRONCHOSCOPY WITH INSERTION OF INTERBRONCHIAL VALVE (IBV);  Surgeon: Kerrin Elspeth BROCKS, MD;  Location: [facility] OR;  Service: Thoracic;  Laterality: N/A;   VIDEO BRONCHOSCOPY WITH INSERTION OF INTERBRONCHIAL VALVE (IBV) N/A 06/13/2018   Procedure: VIDEO BRONCHOSCOPY WITH REMOVAL OF INTERBRONCHIAL VALVE (IBV) x 2;  Surgeon: Kerrin Elspeth BROCKS, MD;  Location: [facility] OR;  Service: Thoracic;  Laterality: N/A;    REVIEW OF SYSTEMS:  Constitutional: positive for fatigue Eyes: negative Ears, nose, mouth, throat, and face: negative Respiratory: positive for dyspnea on exertion Cardiovascular: negative Gastrointestinal: positive for diarrhea and dyspepsia Genitourinary:negative Integument/breast: negative Hematologic/lymphatic: negative Musculoskeletal:negative Neurological: negative Behavioral/Psych: negative Endocrine: negative Allergic/Immunologic: negative   PHYSICAL EXAMINATION: General appearance: alert, cooperative, fatigued, and no distress Head: Normocephalic, without obvious abnormality, atraumatic Neck: no adenopathy, no JVD, supple, symmetrical, trachea midline, and thyroid not enlarged, symmetric, no tenderness/mass/nodules Lymph nodes: Cervical, supraclavicular, and axillary nodes normal. Resp: clear to auscultation bilaterally Back: symmetric, no curvature. ROM normal. No CVA tenderness. Cardio: regular rate and rhythm, S1, S2 normal, no murmur, click, rub or gallop GI: soft, non-tender; bowel sounds normal; no masses,  no organomegaly Extremities: extremities normal, atraumatic, no cyanosis or edema Neurologic: Alert and oriented X 3, normal strength and tone. Normal symmetric reflexes. Normal coordination and gait   ECOG PERFORMANCE STATUS: 1 - Symptomatic but completely ambulatory  Blood pressure 138/86, pulse 84, temperature (!) 97.3 F  (36.3 C), resp. rate 17, height 5' 4.5 (1.638 m), weight 136 lb 6.4 oz (61.9 kg), SpO2 98%.   LABORATORY DATA: Lab Results  Component Value Date   WBC 9.0 01/16/2024   HGB 10.1 (L) 01/16/2024   HCT 31.0 (L) 01/16/2024   MCV 96.3 01/16/2024   PLT 376 01/16/2024      Chemistry      Component Value Date/Time   NA 137 01/16/2024 1030   K 3.7 01/16/2024 1030   CL 103 01/16/2024 1030   CO2 30 01/16/2024 1030   BUN 21 01/16/2024 1030   CREATININE 0.94 01/16/2024 1030      Component Value Date/Time   CALCIUM  8.8 (L) 01/16/2024 1030   ALKPHOS 86 01/16/2024 1030   AST 19 01/16/2024 1030   ALT 11 01/16/2024 1030   BILITOT 0.3 01/16/2024 1030       RADIOGRAPHIC STUDIES: CT Chest Wo Contrast Result Date: 01/16/2024 CLINICAL DATA:  Non-small-cell lung cancer. Restaging. * Tracking Code: BO * EXAM: CT CHEST WITHOUT CONTRAST TECHNIQUE: Multidetector CT imaging of the chest was performed following the standard protocol without IV contrast. RADIATION DOSE REDUCTION: This exam was performed according to the departmental dose-optimization program which includes automated exposure control, adjustment of the mA and/or kV according to patient size and/or use of iterative reconstruction technique. COMPARISON:  10/17/2023 FINDINGS: Cardiovascular: The heart size is normal. No substantial pericardial effusion. Coronary artery  calcification is evident. Mild atherosclerotic calcification is noted in the wall of the thoracic aorta. Mediastinum/Nodes: No mediastinal lymphadenopathy. No evidence for gross hilar lymphadenopathy although assessment is limited by the lack of intravenous contrast on the current study. 8 mm soft tissue nodule adjacent to the distal esophagus (106/2) is stable in the interval. There is no axillary lymphadenopathy. Lungs/Pleura: Stable volume loss left hemithorax compatible with previous left lower lobectomy. Chronic paraspinal pleural thickening/loculated pleural effusion in the left  hemithorax is stable measuring 6 mm in thickness on 59/2 which compares to 6 mm in thickness when measured at a similar level on the prior study. Chronic small left pleural effusion is similar to prior. Scattered foci of peripheral small airway impaction in the right middle lobe have cleared in the interval. There is some scattered subtle areas of small airway impaction in the right upper lobe on today's study. Stable postsurgical scarring in the parahilar left lung. As before, index pleural nodules in the left hemithorax are measured on soft tissue windows today. Index nodule just deep to the lateral third rib measured previously at 3 mm is stable at 3 mm today. Anterior pleural based nodule measured previously at 6 mm is 6 mm again today on 57/2. Index nodule seen anterior to the heart in the anterior mediastinum versus anteromedial left pleural reflection measures 6 mm today (83/2) compared to 7 mm previously, stable within measurement variation. r index nodule measured previously anterior to the heart at 12 x 4 mm is 13 x 4 mm today (115/2). No new suspicious pleural nodule on today's study. No new suspicious pulmonary parenchymal nodule. Upper Abdomen: Stable tiny hypodensity in the dome of the liver. No followup imaging is recommended. No new adrenal nodule or mass. No discrete soft tissue nodules in the region of the gastroesophageal junction as seen on older comparison studies. Musculoskeletal: No worrisome lytic or sclerotic osseous abnormality. Interval complete healing of multiple right-sided rib fracture seen previously. IMPRESSION: 1. Stable exam. No new or progressive findings. 2. Stable postsurgical changes in the left hemithorax with chronic small left pleural effusion. 3. Stable small pleural based nodules in the left hemithorax. No new or progressive pleural findings in the left chest. 4. Interval complete healing of multiple right-sided rib fractures seen previously. 5.  Aortic Atherosclerosis  (ICD10-I70.0). Electronically Signed   By: Camellia Candle M.D.   On: 01/16/2024 12:26      ASSESSMENT AND PLAN: This is a very pleasant 73 years old white female with a stage IIa non-small cell lung cancer, adenocarcinoma with positive EGFR mutation in exon 21 (L858R) status post left lower lobectomy with lymph node dissection under the care of Dr. Kerrin. She declined to proceed with adjuvant systemic chemotherapy or targeted therapy. The patient was on observation since her surgical resection until she had disease recurrence in February 2021 presented with pleural-based masses in the left lung with new biopsy and molecular studies positive for EGFR mutation exon 21 (L858R). The patient started treatment with Tagrisso  80 mg p.o. daily status post almost 43 months of treatment.  The patient underwent CT-guided core biopsy of one of the pleural-based left lung progressive lesion and the final pathology was consistent with metastatic adenocarcinoma. The molecular studies by [facility] and it was positive for EGFR L718V, L858R.  She has negative PD-L1 expression.  She also had molecular studies by [employer] performed recently and it showed similar results with many other none actionable mutations in lung cancer. The patient was seen by  Dr. Shannon and underwent IMRT to the progressive lesion of the left lung pleural-based masses.   She continued her treatment with Tagrisso  80 mg p.o. daily and she has been tolerating the treatment well except for the fatigue. She had evidence for disease progression on imaging studies in October 2024 this overall generalized trend of continued interval progression of her known pleural disease in the left hemithorax and left anterior medial pleural reflection versus anterior mediastinum with interval increase in the left pleural effusion that is now moderate to large in size with adjacent mild atelectasis in the lower left lung and stable soft tissue nodules in the  region of the GE junction. At that time I had a lengthy discussion with the patient about her condition. I discussed with the patient several options for management of her condition including continued treatment with Tagrisso  but adding systemic chemotherapy with carboplatin and Alimta versus discontinuing treatment with Tagrisso  and switching her to afatinib  which has activity in patient with the uncommon mutation in EGFR L718V in addition to the common L858R mutation versus discontinuing treatment with Tagrisso  and starting the patient on treatment with systemic chemotherapy in addition to Amivantamab but this is usually associated with significant hypersensitivity reaction especially during the first 2 doses. The patient was also given the option of revisiting Dr. Tobie at [facility] for discussion of the clinical trial with the [id number] oral EGFR inhibitor. After a lengthy discussion the patient decided to proceed with treatment with afatinib  and this was given at a dose of 30 mg p.o. daily.  She started the first dose of this treatment on February 06, 2023. The patient has been tolerating this treatment fairly well except for the few episodes of diarrhea and fatigue from the anemia. She had repeat CT scan of the chest performed recently.  I personally independently reviewed the scan images and discussed the result with the patient and her son. Her scan showed no concerning findings for disease progression. Assessment and Plan Assessment & Plan Recurrent non-small cell lung cancer, adenocarcinoma with pleural involvement Recurrent non-small cell lung cancer, adenocarcinoma, initially diagnosed as stage 2A in November 2019. Status post left lower lobectomy with lymph node dissection. Disease recurred with progressive pleural base metastasis. Previously treated with Tagrisso  (osimertinib ) 80 mg daily for 43 months, discontinued due to disease progression and new resistant EGFR mutation L718V, in  addition to the original mutation of EGFR L858R. Currently on afatinib  30 mg daily since February 06, 2023. Recent CT scan shows no evidence of growth or spreading of the cancer. Tolerating afatinib  well with manageable side effects. Discussed the possibility of chemotherapy if disease progresses, with standard treatment available in both [place] and [place]. 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 Diarrhea exacerbated by dairy products, so she is careful with her diet. - Use Imodium as needed for diarrhea - Avoid dairy products to prevent exacerbation of diarrhea  Cutaneous drug reaction (rash and dryness) Experiencing intermittent itchy bumps around her mouth and on the front area, attributed to dryness. Uses hydrocortisone cream, which helps alleviate symptoms after a week or two. 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 Hemoglobin level at 10.1, albumin level at 3.8 indicating adequate nutrition. 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  Chronic gastroesophageal reflux disease with intolerance to proton pump inhibitors and anaphylactic reaction to esomeprazole Experiencing constant reflux and heartburn despite taking Pepcid. Anaphylactic reaction to Nexium (esomeprazole) and cannot take proton pump inhibitors. Advised to seek evaluation from a gastroenterologist to rule out underlying issues such as an ulcer. Discussed the importance of addressing the underlying cause rather than just treating symptoms. Acknowledged concerns about proton pump inhibitors  and suggested alternative treatments may be available. - Refer to gastroenterology for evaluation of chronic gastroesophageal reflux disease  Peripheral edema, mild and intermittent Reports occasional swelling around ankles. Mild swelling noted. Suggested monitoring salt intake as a potential contributing factor. She was advised to call immediately if she has any other concerning symptoms in the interval. The patient voices understanding of current disease status and treatment options and is in agreement with the current care plan. All questions were answered. The patient knows to call the clinic with any problems, questions or concerns. We can certainly see the patient much sooner if necessary. The total time spent in the appointment was 55 minutes including review of chart and various tests results, discussions about plan of care and coordination of care plan .  Disclaimer: This note was dictated with voice recognition software. Similar sounding words can inadvertently be transcribed and may not be corrected upon review.

## 2024-01-17 ENCOUNTER — Other Ambulatory Visit: Payer: Self-pay

## 2024-01-17 NOTE — Progress Notes (Signed)
 Specialty Pharmacy Ongoing Clinical Assessment Note  [name] is a 72 y.o. female who is being followed by the specialty pharmacy service for RxSp Oncology   Patient's specialty medication(s) reviewed today: Afatinib  Dimaleate (GILOTRIF )   Missed doses in the last 4 weeks: 0   Patient/Caregiver asked additional questions regarding CBD gummies. Patient's son asked if they would be effective, but patient did not want to try them. Also asked about taking Pepcid twice daily to help with heartburn and to avoid having to use Pepto.  Therapeutic benefit summary: Patient is achieving benefit   Adverse events/side effects summary: Experienced adverse events/side effects (Rash, anemia, indigestion, eye/nose trouble, dryness, diarrhea - all very intermittent.)   Patient's therapy is appropriate to: Continue    Goals Addressed             This Visit's Progress    Slow Disease Progression   On track    Patient is on track. Patient will maintain adherence. Per provider notes from 01/16/24, recent CT showed no evidence of growth or spreading of cancer.          Follow up: 6 months - Patient has been on therapy for a year as of 02/06/23  [facility] Specialty Pharmacist

## 2024-02-12 ENCOUNTER — Other Ambulatory Visit (HOSPITAL_COMMUNITY): Payer: Self-pay

## 2024-02-12 ENCOUNTER — Other Ambulatory Visit: Payer: Self-pay

## 2024-02-13 ENCOUNTER — Other Ambulatory Visit: Payer: Self-pay

## 2024-02-13 NOTE — Progress Notes (Signed)
 Specialty Pharmacy Refill Coordination Note  [name] is a 73 y.o. female contacted today regarding refills of specialty medication(s) Afatinib  Dimaleate (GILOTRIF )   Patient requested Delivery   Delivery date: 02/16/24   Verified address: [address]   [place] [address]   Medication will be filled on: 02/15/24

## 2024-02-14 ENCOUNTER — Other Ambulatory Visit: Payer: Self-pay

## 2024-03-06 ENCOUNTER — Other Ambulatory Visit: Payer: Self-pay

## 2024-03-13 ENCOUNTER — Other Ambulatory Visit (HOSPITAL_COMMUNITY): Payer: Self-pay

## 2024-03-13 ENCOUNTER — Encounter (INDEPENDENT_AMBULATORY_CARE_PROVIDER_SITE_OTHER): Payer: Self-pay

## 2024-03-15 ENCOUNTER — Other Ambulatory Visit: Payer: Self-pay

## 2024-03-15 NOTE — Progress Notes (Signed)
 Specialty Pharmacy Refill Coordination Note  [name] is a 73 y.o. female contacted today regarding refills of specialty medication(s) Afatinib  Dimaleate (GILOTRIF )   Patient requested Delivery   Delivery date: 03/18/24   Verified address: [address] [facility] [address]   Medication will be filled on: 03/15/24

## 2024-03-27 ENCOUNTER — Inpatient Hospital Stay: Attending: Internal Medicine

## 2024-03-27 ENCOUNTER — Inpatient Hospital Stay: Admitting: Internal Medicine

## 2024-03-27 VITALS — BP 154/70 | HR 84 | Temp 97.6°F | Resp 17 | Ht 64.5 in | Wt 136.0 lb

## 2024-03-27 DIAGNOSIS — Z902 Acquired absence of lung [part of]: Secondary | ICD-10-CM | POA: Diagnosis not present

## 2024-03-27 DIAGNOSIS — Z923 Personal history of irradiation: Secondary | ICD-10-CM | POA: Diagnosis not present

## 2024-03-27 DIAGNOSIS — L309 Dermatitis, unspecified: Secondary | ICD-10-CM | POA: Insufficient documentation

## 2024-03-27 DIAGNOSIS — C3490 Malignant neoplasm of unspecified part of unspecified bronchus or lung: Secondary | ICD-10-CM | POA: Diagnosis not present

## 2024-03-27 DIAGNOSIS — N182 Chronic kidney disease, stage 2 (mild): Secondary | ICD-10-CM | POA: Insufficient documentation

## 2024-03-27 DIAGNOSIS — C3432 Malignant neoplasm of lower lobe, left bronchus or lung: Secondary | ICD-10-CM | POA: Diagnosis present

## 2024-03-27 DIAGNOSIS — C3492 Malignant neoplasm of unspecified part of left bronchus or lung: Secondary | ICD-10-CM

## 2024-03-27 DIAGNOSIS — L710 Perioral dermatitis: Secondary | ICD-10-CM | POA: Insufficient documentation

## 2024-03-27 DIAGNOSIS — K219 Gastro-esophageal reflux disease without esophagitis: Secondary | ICD-10-CM | POA: Insufficient documentation

## 2024-03-27 LAB — CBC WITH DIFFERENTIAL (CANCER CENTER ONLY)
Abs Immature Granulocytes: 0.03 K/uL (ref 0.00–0.07)
Basophils Absolute: 0.1 K/uL (ref 0.0–0.1)
Basophils Relative: 1 %
Eosinophils Absolute: 0.1 K/uL (ref 0.0–0.5)
Eosinophils Relative: 1 %
HCT: 31.4 % — ABNORMAL LOW (ref 36.0–46.0)
Hemoglobin: 10.1 g/dL — ABNORMAL LOW (ref 12.0–15.0)
Immature Granulocytes: 0 %
Lymphocytes Relative: 24 %
Lymphs Abs: 2.4 K/uL (ref 0.7–4.0)
MCH: 31 pg (ref 26.0–34.0)
MCHC: 32.2 g/dL (ref 30.0–36.0)
MCV: 96.3 fL (ref 80.0–100.0)
Monocytes Absolute: 0.7 K/uL (ref 0.1–1.0)
Monocytes Relative: 7 %
Neutro Abs: 6.7 K/uL (ref 1.7–7.7)
Neutrophils Relative %: 67 %
Platelet Count: 368 K/uL (ref 150–400)
RBC: 3.26 MIL/uL — ABNORMAL LOW (ref 3.87–5.11)
RDW: 13.1 % (ref 11.5–15.5)
WBC Count: 10.1 K/uL (ref 4.0–10.5)
nRBC: 0 % (ref 0.0–0.2)

## 2024-03-27 LAB — CMP (CANCER CENTER ONLY)
ALT: 16 U/L (ref 0–44)
AST: 26 U/L (ref 15–41)
Albumin: 4 g/dL (ref 3.5–5.0)
Alkaline Phosphatase: 98 U/L (ref 38–126)
Anion gap: 9 (ref 5–15)
BUN: 26 mg/dL — ABNORMAL HIGH (ref 8–23)
CO2: 26 mmol/L (ref 22–32)
Calcium: 8.9 mg/dL (ref 8.9–10.3)
Chloride: 104 mmol/L (ref 98–111)
Creatinine: 1.12 mg/dL — ABNORMAL HIGH (ref 0.44–1.00)
GFR, Estimated: 51 mL/min — ABNORMAL LOW (ref >60)
Glucose, Bld: 116 mg/dL — ABNORMAL HIGH (ref 70–99)
Potassium: 4.4 mmol/L (ref 3.5–5.1)
Sodium: 139 mmol/L (ref 135–145)
Total Bilirubin: 0.3 mg/dL (ref 0.0–1.2)
Total Protein: 6.4 g/dL — ABNORMAL LOW (ref 6.5–8.1)

## 2024-03-27 MED ORDER — CLINDAMYCIN PHOSPHATE 1 % EX LOTN: TOPICAL_LOTION | Freq: Two times a day (BID) | CUTANEOUS | 0 refills | Status: AC

## 2024-03-27 NOTE — Progress Notes (Signed)
 [facility] [phone]   Fax:([phone]  OFFICE PROGRESS NOTE  Cheryl Andree BIRCH, MD [address] [place] [address]  DIAGNOSIS: Recurrent non-small cell lung cancer initially diagnosed as stage IIA (T2b, N0, M0) non-small cell lung cancer, adenocarcinoma diagnosed in November 2019  Molecular studies showed EGFR mutation in exon 21 (L858R) PD-L1 expression 0  Biomarker Findings on disease progression in 02/2022 Microsatellite status - MS-Stable Tumor Mutational Burden - 4 Muts/Mb Genomic Findings For a complete list of the genes assayed, please refer to the Appendix. EGFR L718V, L858R CTNNB1 D32Y SMAD4 E330K 7 Disease relevant genes with no reportable alterations: ALK, BRAF, ERBB2, KRAS, MET, RET, ROS1  Molecular studies by [id number] performed in September 2024 showed similar results  PRIOR THERAPY:  1) status post left lower lobectomy with lymph node dissection on April 25, 2018 under the care of Dr. Kerrin.  She declined adjuvant therapy 2) status post palliative radiotherapy with IMRT to the progressive pleural-based lung masses under the care of Dr. Shannon completed on February 28, 2022. 3) Osimertinib  (Tagrisso ) 80 mg p.o. daily.  First dose was on June 19, 2019.  Status post 43 months of treatment.  CURRENT THERAPY: Afatinib  30 mg p.o. daily.  First dose February 06, 2023.  Status post 14 months of treatment.  INTERVAL HISTORY: [name] 73 y.o. female returns to the clinic today for follow-up visit accompanied by her son [name]. Discussed the use of AI scribe software for clinical note transcription with the patient, who gave verbal consent to proceed.  History of Present Illness [name] is a 73 year old female with recurrent non-small cell lung cancer (EGFR mutation) who presents for oncology follow-up and repeat laboratory evaluation.  She was diagnosed with non-small cell lung cancer with EGFR  mutation in November 2019 and underwent left lower lobectomy with lymph node dissection. Following disease recurrence, she received palliative radiotherapy and has been under surveillance since October 2024. Her most recent imaging was on January 16, 2024. She is maintained on fentanyl  and other supportive medications. Laboratory studies show stable hemoglobin at 10.1 and mildly elevated creatinine at 1.12. She has increased her protein intake and is not experiencing any acute oncologic complications.  She describes a persistent perioral rash around her nose and mouth, initially severe with bleeding and crusting, now improved with topical clotrimazole. The rash is a source of embarrassment and is suspected to be a side effect of fentanyl . She has received input from her dentist regarding possible vitamin deficiency but is taking B12 and other vitamins regularly.  She experiences chronic swelling and pain in her fingers, primarily affecting the skin on the sides, more pronounced in the right hand. The symptoms migrate between fingers and never fully resolve, though over-the-counter emollient provides some relief. She is considering the use of cyanoacrylate adhesive for fissures as suggested by her dermatologist and discussed with her oncologist. She wears gloves more frequently, especially when washing dishes, to help manage symptoms.  She takes famotidine twice daily for gastrointestinal symptoms, which are currently well controlled. She inquired about the timing of famotidine relative to her antihypertensive medication due to concerns about leg and ankle cramps when taken close together. She takes famotidine twice daily for gastrointestinal symptoms, which are currently well controlled.    MEDICAL HISTORY: Past Medical History:  Diagnosis Date   Anemia    Atherosclerosis    Family history of lung cancer    GERD (gastroesophageal reflux  disease)    Hemoptysis    History of radiation therapy     Left Lung- 02/15/22-02/28/22- Dr. Lynwood Nasuti   Hyperlipidemia    Hypertension    IBS (irritable bowel syndrome)    Lung mass    Migraines    NSCL CA WITH RECURRENCE 2019   2021   Spondylosis    Vitamin D deficiency     ALLERGIES:  is allergic to avocado, influenza vaccines, lisinopril , metoprolol , nexium [esomeprazole magnesium], penicillins, porcine (pork) protein-containing drug products, proton pump inhibitors, sulfites, wasp venom protein, zantac [ranitidine hcl], cefaclor, chlorhexidine , chloroxine, ciprofloxacin hcl, clarithromycin, gatifloxacin, other, sulfa antibiotics, sulfamethoxazole-trimethoprim, ambrosia artemisiifolia (ragweed) skin test, buckwheat, cat dander, dog epithelium (canis lupus familiaris), egg protein-containing drug products, fiber, fluogen [influenza virus vaccine], lentil, pollen extract, rumex acetosella (sheep's sorrel), short ragweed pollen ext, sulfur, aspartame, chocolate, dilaudid  [hydromorphone  hcl], fentanyl , ivp dye [iodinated contrast media], ketorolac, milk-related compounds, molds & smuts, oxycodone , prednisolone, shellfish allergy, soap, and tilactase.  MEDICATIONS:  Current Outpatient Medications  Medication Sig Dispense Refill   afatinib  dimaleate (GILOTRIF ) 30 MG tablet Take 1 tablet (30 mg total) by mouth daily. Take on an empty stomach 1hr before or 2hrs after meals. 30 tablet 2   Carboxymethylcellul-Glycerin (LUBRICATING EYE DROPS OP) Place 1 drop into both eyes daily as needed (dry eyes).     Cholecalciferol (VITAMIN D3) LIQD Take 2,000 Units by mouth daily.     clindamycin  (CLINDAGEL) 1 % gel Apply topically 2 (two) times daily. 60 g 0   Cyanocobalamin (VITAMIN B-12) 1000 MCG SUBL Place 1 tablet under the tongue once a week.     diltiazem  (CARDIZEM  CD) 120 MG 24 hr capsule Take 120 mg by mouth daily.     doxycycline  (VIBRA -TABS) 100 MG tablet Take 1 tablet (100 mg total) by mouth daily. 28 tablet 0   EPINEPHrine  0.3 mg/0.3 mL IJ SOAJ  injection Inject 0.3 mg into the muscle as needed for anaphylaxis. (Patient not taking: Reported on 02/21/2023)     erythromycin ophthalmic ointment Place 1 Application into both eyes 2 (two) times daily.     famotidine (PEPCID) 40 MG tablet Take 40 mg by mouth daily.     LORazepam  (ATIVAN ) 0.5 MG tablet 1 tablet p.o. 15-30 minutes before thoracentesis, repeat x 1 if needed 2 tablet 0   methylPREDNISolone  (MEDROL  DOSEPAK) 4 MG TBPK tablet Use as instructed 21 tablet 0   Multiple Vitamin (MULTIVITAMIN) tablet Take 1 tablet by mouth daily.     mupirocin ointment (BACTROBAN) 2 % Apply 1 Application topically 2 (two) times daily. May take 2-3 times daily.     Omega 3-6-9 Fatty Acids (OMEGA-3 FUSION) LIQD Take by mouth.     OVER THE COUNTER MEDICATION Iron gummies.     PRESCRIPTION MEDICATION Apply 1 application topically 2 (two) times daily as needed for itching. Dr Shirleen 2019     rizatriptan (MAXALT) 10 MG tablet Take 5 mg by mouth as needed for migraine. May repeat in 2 hours if needed (Patient not taking: Reported on 02/03/2022)     No current facility-administered medications for this visit.    SURGICAL HISTORY:  Past Surgical History:  Procedure Laterality Date   BROW LIFT AND BLEPHAROPLASTY  03/15/2013   CATARACT EXTRACTION, BILATERAL  06/23/2014   CHEST TUBE INSERTION Left 05/10/2018   Procedure: CHEST TUBE INSERTION;  Surgeon: Kerrin Elspeth BROCKS, MD;  Location: [facility] OR;  Service: Thoracic;  Laterality: Left;   COLONOSCOPY  05/2014   ESOPHAGOGASTRODUODENOSCOPY  2019  EYE SURGERY     IR THORACENTESIS ASP PLEURAL SPACE W/IMG GUIDE  05/24/2018   IR THORACENTESIS ASP PLEURAL SPACE W/IMG GUIDE  06/14/2018   IR THORACENTESIS ASP PLEURAL SPACE W/IMG GUIDE  02/02/2023   TONSILLECTOMY     VIDEO ASSISTED THORACOSCOPY (VATS)/ LOBECTOMY Left 04/25/2018   Procedure: VIDEO ASSISTED THORACOSCOPY (VATS)LEFT LOWER LOBECTOMY, NODE DISSECTION;  Surgeon: Kerrin Elspeth BROCKS, MD;  Location: [facility] OR;   Service: Thoracic;  Laterality: Left;   VIDEO BRONCHOSCOPY WITH ENDOBRONCHIAL NAVIGATION N/A 03/07/2018   Procedure: VIDEO BRONCHOSCOPY WITH ENDOBRONCHIAL NAVIGATION;  Surgeon: Shelah Lamar RAMAN, MD;  Location: [facility] OR;  Service: Thoracic;  Laterality: N/A;   VIDEO BRONCHOSCOPY WITH INSERTION OF INTERBRONCHIAL VALVE (IBV) N/A 05/10/2018   Procedure: VIDEO BRONCHOSCOPY WITH INSERTION OF INTERBRONCHIAL VALVE (IBV);  Surgeon: Kerrin Elspeth BROCKS, MD;  Location: [facility] OR;  Service: Thoracic;  Laterality: N/A;   VIDEO BRONCHOSCOPY WITH INSERTION OF INTERBRONCHIAL VALVE (IBV) N/A 06/13/2018   Procedure: VIDEO BRONCHOSCOPY WITH REMOVAL OF INTERBRONCHIAL VALVE (IBV) x 2;  Surgeon: Kerrin Elspeth BROCKS, MD;  Location: [facility] OR;  Service: Thoracic;  Laterality: N/A;    REVIEW OF SYSTEMS:  Constitutional: positive for fatigue Eyes: negative Ears, nose, mouth, throat, and face: negative Respiratory: negative Cardiovascular: negative Gastrointestinal: positive for diarrhea and dyspepsia Genitourinary:negative Integument/breast: positive for dryness and rash Hematologic/lymphatic: negative Musculoskeletal:negative Neurological: negative Behavioral/Psych: negative Endocrine: negative Allergic/Immunologic: negative   PHYSICAL EXAMINATION: General appearance: alert, cooperative, fatigued, and no distress Head: Normocephalic, without obvious abnormality, atraumatic Neck: no adenopathy, no JVD, supple, symmetrical, trachea midline, and thyroid not enlarged, symmetric, no tenderness/mass/nodules Lymph nodes: Cervical, supraclavicular, and axillary nodes normal. Resp: clear to auscultation bilaterally Back: symmetric, no curvature. ROM normal. No CVA tenderness. Cardio: regular rate and rhythm, S1, S2 normal, no murmur, click, rub or gallop GI: soft, non-tender; bowel sounds normal; no masses,  no organomegaly Extremities: extremities normal, atraumatic, no cyanosis or edema Neurologic: Alert and oriented X 3,  normal strength and tone. Normal symmetric reflexes. Normal coordination and gait   ECOG PERFORMANCE STATUS: 1 - Symptomatic but completely ambulatory  Blood pressure (!) 154/70, pulse 84, temperature 97.6 F (36.4 C), temperature source Temporal, resp. rate 17, height 5' 4.5 (1.638 m), weight 136 lb (61.7 kg), SpO2 100%.   LABORATORY DATA: Lab Results  Component Value Date   WBC 10.1 03/27/2024   HGB 10.1 (L) 03/27/2024   HCT 31.4 (L) 03/27/2024   MCV 96.3 03/27/2024   PLT 368 03/27/2024      Chemistry      Component Value Date/Time   NA 139 03/27/2024 1307   K 4.4 03/27/2024 1307   CL 104 03/27/2024 1307   CO2 26 03/27/2024 1307   BUN 26 (H) 03/27/2024 1307   CREATININE 1.12 (H) 03/27/2024 1307      Component Value Date/Time   CALCIUM  8.9 03/27/2024 1307   ALKPHOS 98 03/27/2024 1307   AST 26 03/27/2024 1307   ALT 16 03/27/2024 1307   BILITOT 0.3 03/27/2024 1307       RADIOGRAPHIC STUDIES: No results found.     ASSESSMENT AND PLAN: This is a very pleasant 73 years old white female with a stage IIa non-small cell lung cancer, adenocarcinoma with positive EGFR mutation in exon 21 (L858R) status post left lower lobectomy with lymph node dissection under the care of Dr. Kerrin. She declined to proceed with adjuvant systemic chemotherapy or targeted therapy. The patient was on observation since her surgical resection until she had disease recurrence  in February 2021 presented with pleural-based masses in the left lung with new biopsy and molecular studies positive for EGFR mutation exon 21 (L858R). The patient started treatment with Tagrisso  80 mg p.o. daily status post almost 43 months of treatment.  The patient underwent CT-guided core biopsy of one of the pleural-based left lung progressive lesion and the final pathology was consistent with metastatic adenocarcinoma. The molecular studies by [facility] and it was positive for EGFR L718V, L858R.  She has negative  PD-L1 expression.  She also had molecular studies by [email or web address] performed recently and it showed similar results with many other none actionable mutations in lung cancer. The patient was seen by Dr. Shannon and underwent IMRT to the progressive lesion of the left lung pleural-based masses.   She continued her treatment with Tagrisso  80 mg p.o. daily and she has been tolerating the treatment well except for the fatigue. She had evidence for disease progression on imaging studies in October 2024 this overall generalized trend of continued interval progression of her known pleural disease in the left hemithorax and left anterior medial pleural reflection versus anterior mediastinum with interval increase in the left pleural effusion that is now moderate to large in size with adjacent mild atelectasis in the lower left lung and stable soft tissue nodules in the region of the GE junction. At that time I had a lengthy discussion with the patient about her condition. I discussed with the patient several options for management of her condition including continued treatment with Tagrisso  but adding systemic chemotherapy with carboplatin and Alimta versus discontinuing treatment with Tagrisso  and switching her to afatinib  which has activity in patient with the uncommon mutation in EGFR L718V in addition to the common L858R mutation versus discontinuing treatment with Tagrisso  and starting the patient on treatment with systemic chemotherapy in addition to Amivantamab but this is usually associated with significant hypersensitivity reaction especially during the first 2 doses. The patient was also given the option of revisiting Dr. Tobie at [facility] for discussion of the clinical trial with the [id number] oral EGFR inhibitor. After a lengthy discussion the patient decided to proceed with treatment with afatinib  and this was given at a dose of 30 mg p.o. daily.  She started the first dose of this treatment on  February 06, 2023. The patient has been tolerating this treatment fairly well except for the few episodes of diarrhea, fatigue from the anemia as well as recent skin rash around the nose small breaks of the skin close to the nails of the fingers. Assessment and Plan Assessment & Plan Recurrent non-small cell lung cancer with EGFR mutation, status post left lower lobectomy Recurrent non-small cell lung cancer with EGFR mutation, status post left lower lobectomy, lymph node dissection, and palliative radiotherapy.  She is currently on afatinib  30 mg p.o. daily.  Last imaging performed January 16, 2023. - Arranged repeat imaging for end of January or early February 2026 with same-day follow-up.  Anemia of chronic disease Hemoglobin stable at 10.1 g/dL, unchanged from prior visit. - Monitored hemoglobin with repeat laboratory studies.  Drug-induced perioral dermatitis Perioral inflammatory dermatitis, likely secondary to fentanyl . Minimal response to prior topical clotrimazole. Rash is bothersome and cosmetically distressing. - Prescribed clindamycin  lotion for perioral dermatitis. - Advised alternating clotrimazole and clindamycin  lotion as needed. - Sent clindamycin  prescription to [facility] on [address].  Chronic kidney disease, stage 2 Serum creatinine mildly elevated at 1.12 mg/dL, likely due to mild dehydration. - Reviewed recent laboratory results  and renal function. - Advised increased oral hydration.  Chronic finger dermatitis Chronic swelling and pain in the fingers with intermittent involvement. Over-the-counter emollient provides partial relief. Dermatologist and oncology recommendations include super glue for fissures. Symptoms impact hand comfort and function. - Discussed use of gloves for dishwashing and maintaining hand warmth. - Discussed use of super glue for fissures as per dermatology recommendation.  Gastroesophageal reflux disease Symptoms well controlled on  famotidine twice daily. She reported possible association of leg cramps when famotidine is taken close to antihypertensive medication. - Reviewed famotidine dosing and advised spacing by two hours from antihypertensive medication to minimize possible interaction. - Deferred gastroenterology referral as symptoms are controlled. She was advised to call immediately if she has any other concerning symptoms in the interval.   The patient voices understanding of current disease status and treatment options and is in agreement with the current care plan. All questions were answered. The patient knows to call the clinic with any problems, questions or concerns. We can certainly see the patient much sooner if necessary. The total time spent in the appointment was 30 minutes including review of chart and various tests results, discussions about plan of care and coordination of care plan .  Disclaimer: This note was dictated with voice recognition software. Similar sounding words can inadvertently be transcribed and may not be corrected upon review.

## 2024-04-05 ENCOUNTER — Encounter (INDEPENDENT_AMBULATORY_CARE_PROVIDER_SITE_OTHER): Payer: Self-pay

## 2024-04-05 ENCOUNTER — Other Ambulatory Visit: Payer: Self-pay | Admitting: Internal Medicine

## 2024-04-05 ENCOUNTER — Other Ambulatory Visit: Payer: Self-pay

## 2024-04-05 ENCOUNTER — Other Ambulatory Visit: Payer: Self-pay | Admitting: Pharmacy Technician

## 2024-04-05 NOTE — Progress Notes (Signed)
 Specialty Pharmacy Refill Coordination Note  Specialty Pharmacy Refill Coordination Note  [name] is a 73 y.o. female contacted today regarding refills of specialty medication(s) Afatinib  Dimaleate (GILOTRIF )   Patient requested Delivery   Delivery date: 04-09-2024   Verified address: [address] [place]. [address]   Medication will be filled on: 04-08-2024   This fill date is pending response to refill request from provider. Patient is aware and if they have not received fill by intended date they must follow up with pharmacy.

## 2024-04-08 ENCOUNTER — Other Ambulatory Visit: Payer: Self-pay

## 2024-04-08 MED ORDER — AFATINIB DIMALEATE 30 MG PO TABS
30.0000 mg | ORAL_TABLET | Freq: Every day | ORAL | 2 refills | Status: AC
  Filled 2024-04-08: qty 30, 30d supply, fill #0
  Filled 2024-05-06: qty 30, 30d supply, fill #1

## 2024-04-08 NOTE — Progress Notes (Signed)
 Medication refill too soon on patient plan until 12.24.25, called patient and due to travel plans patient needs medication before 12.26.25. called patient plan and got vacation override placed, mailing 12.22

## 2024-04-19 ENCOUNTER — Other Ambulatory Visit (HOSPITAL_COMMUNITY): Payer: Self-pay

## 2024-04-19 ENCOUNTER — Telehealth: Payer: Self-pay

## 2024-04-19 NOTE — Telephone Encounter (Signed)
 Oral Oncology Patient Advocate Encounter  Was successful in securing patient a $6000 grant from [facility] to provide copayment coverage for Gilotrif .  This will keep the out of pocket expense at $0.     [employer] ID: [id number]   The billing information is as follows and has been shared with [facility].    RxBin: [id number] PCN: [id number] Member ID: [id number] Group ID: [id number] Dates of Eligibility: 03/20/24 through 03/19/25  Fund:  NSCLC  Lucie Lamer, CPhT [place]  [facility] Oncology Pharmacy Patient Advocate Specialist II [place] Phone: [phone]  Fax: [phone] [email or web address]

## 2024-05-01 ENCOUNTER — Other Ambulatory Visit: Payer: Self-pay

## 2024-05-06 ENCOUNTER — Other Ambulatory Visit: Payer: Self-pay

## 2024-05-08 ENCOUNTER — Other Ambulatory Visit: Payer: Self-pay

## 2024-05-08 NOTE — Progress Notes (Signed)
 Specialty Pharmacy Refill Coordination Note  [name] is a 74 y.o. female contacted today regarding refills of specialty medication(s) Afatinib  Dimaleate (GILOTRIF )   Patient requested Delivery   Delivery date: 05/10/24   Verified address: [address] [place]. [address]   Medication will be filled on: 05/09/24

## 2024-05-09 ENCOUNTER — Other Ambulatory Visit: Payer: Self-pay

## 2024-05-15 ENCOUNTER — Other Ambulatory Visit: Payer: Self-pay

## 2024-05-22 ENCOUNTER — Inpatient Hospital Stay: Admitting: Physician Assistant

## 2024-05-22 ENCOUNTER — Inpatient Hospital Stay

## 2024-05-22 ENCOUNTER — Ambulatory Visit (HOSPITAL_COMMUNITY)

## 2024-05-23 ENCOUNTER — Inpatient Hospital Stay

## 2024-05-23 ENCOUNTER — Ambulatory Visit (HOSPITAL_COMMUNITY)
Admission: RE | Admit: 2024-05-23 | Discharge: 2024-05-23 | Disposition: A | Source: Ambulatory Visit | Attending: Internal Medicine | Admitting: Internal Medicine

## 2024-05-23 ENCOUNTER — Inpatient Hospital Stay: Attending: Internal Medicine | Admitting: Internal Medicine

## 2024-05-23 DIAGNOSIS — C3492 Malignant neoplasm of unspecified part of left bronchus or lung: Secondary | ICD-10-CM

## 2024-05-23 DIAGNOSIS — C3490 Malignant neoplasm of unspecified part of unspecified bronchus or lung: Secondary | ICD-10-CM

## 2024-05-23 LAB — CBC WITH DIFFERENTIAL (CANCER CENTER ONLY)
Abs Immature Granulocytes: 0.04 10*3/uL (ref 0.00–0.07)
Basophils Absolute: 0 10*3/uL (ref 0.0–0.1)
Basophils Relative: 0 %
Eosinophils Absolute: 0.2 10*3/uL (ref 0.0–0.5)
Eosinophils Relative: 2 %
HCT: 30.5 % — ABNORMAL LOW (ref 36.0–46.0)
Hemoglobin: 10.1 g/dL — ABNORMAL LOW (ref 12.0–15.0)
Immature Granulocytes: 1 %
Lymphocytes Relative: 26 %
Lymphs Abs: 2.2 10*3/uL (ref 0.7–4.0)
MCH: 31.5 pg (ref 26.0–34.0)
MCHC: 33.1 g/dL (ref 30.0–36.0)
MCV: 95 fL (ref 80.0–100.0)
Monocytes Absolute: 0.7 10*3/uL (ref 0.1–1.0)
Monocytes Relative: 8 %
Neutro Abs: 5.4 10*3/uL (ref 1.7–7.7)
Neutrophils Relative %: 63 %
Platelet Count: 417 10*3/uL — ABNORMAL HIGH (ref 150–400)
RBC: 3.21 MIL/uL — ABNORMAL LOW (ref 3.87–5.11)
RDW: 12.8 % (ref 11.5–15.5)
WBC Count: 8.6 10*3/uL (ref 4.0–10.5)
nRBC: 0 % (ref 0.0–0.2)

## 2024-05-23 LAB — CMP (CANCER CENTER ONLY)
ALT: 14 U/L (ref 0–44)
AST: 26 U/L (ref 15–41)
Albumin: 3.8 g/dL (ref 3.5–5.0)
Alkaline Phosphatase: 88 U/L (ref 38–126)
Anion gap: 10 (ref 5–15)
BUN: 20 mg/dL (ref 8–23)
CO2: 26 mmol/L (ref 22–32)
Calcium: 8.9 mg/dL (ref 8.9–10.3)
Chloride: 102 mmol/L (ref 98–111)
Creatinine: 0.93 mg/dL (ref 0.44–1.00)
GFR, Estimated: >60 mL/min
Glucose, Bld: 98 mg/dL (ref 70–99)
Potassium: 3.9 mmol/L (ref 3.5–5.1)
Sodium: 138 mmol/L (ref 135–145)
Total Bilirubin: 0.2 mg/dL (ref 0.0–1.2)
Total Protein: 5.9 g/dL — ABNORMAL LOW (ref 6.5–8.1)

## 2024-05-23 NOTE — Progress Notes (Signed)
 "     [facility] [phone]   [phone]  OFFICE PROGRESS NOTE  Cheryl Andree BIRCH, MD [address] [place] [address]  DIAGNOSIS: Recurrent non-small cell lung cancer initially diagnosed as stage IIA (T2b, N0, M0) non-small cell lung cancer, adenocarcinoma diagnosed in November 2019  Molecular studies showed EGFR mutation in exon 21 (L858R) PD-L1 expression 0  Biomarker Findings on disease progression in 02/2022 Microsatellite status - MS-Stable Tumor Mutational Burden - 4 Muts/Mb Genomic Findings For a complete list of the genes assayed, please refer to the Appendix. EGFR L718V, L858R CTNNB1 D32Y SMAD4 E330K 7 Disease relevant genes with no reportable alterations: ALK, BRAF, ERBB2, KRAS, MET, RET, ROS1  Molecular studies by [id number] performed in September 2024 showed similar results  PRIOR THERAPY:  1) status post left lower lobectomy with lymph node dissection on April 25, 2018 under the care of Dr. Kerrin.  She declined adjuvant therapy 2) status post palliative radiotherapy with IMRT to the progressive pleural-based lung masses under the care of Dr. Shannon completed on February 28, 2022. 3) Osimertinib  (Tagrisso ) 80 mg p.o. daily.  First dose was on June 19, 2019.  Status post 43 months of treatment.  CURRENT THERAPY: Afatinib  30 mg p.o. daily.  First dose February 06, 2023.  Status post 15 months of treatment.  INTERVAL HISTORY: [name] 74 y.o. female returns to the clinic today for follow-up visit accompanied by her son [name]. Discussed the use of AI scribe software for clinical note transcription with the patient, who gave verbal consent to proceed.  History of Present Illness [name] is a 74 year old female with recurrent EGFR-mutant non-small cell lung cancer who presents for oncology follow-up and disease restaging.  She was diagnosed with stage IIA non-small cell lung cancer with EGFR exon 21  L858R mutation in November 2019 and underwent left lower lobectomy with lymph node dissection, declining adjuvant therapy. Disease recurrence was subsequently identified, and she was treated with osimertinib  for 43 months until progression, at which time molecular studies revealed persistent L858R and new L718V EGFR mutations. She began afatinib  30 mg daily in October 2024 and remains on this regimen. She is currently asymptomatic from her lung cancer, feels well overall, and is not oxygen dependent. Recent chest CT demonstrates minimal interval changes in pulmonary nodules. Appetite is good, and there are no new pulmonary or constitutional symptoms.  She experiences significant paronychia and fingernail changes, including pain and partial nail detachment, attributed to afatinib . She has trialed topical treatments from dermatology and oncology, including creams, cotton gloves at night, and over-the-counter products, with partial improvement. Symptoms persist and are exacerbated by winter weather. She denies need for glue application except to cover exposed nail bed areas. Intermittent rash is present but currently improved. Nasal symptoms have improved but are not fully resolved.  She continues iron supplementation for iron deficiency, with persistently elevated platelets and low serum protein despite adequate dietary intake. She denies current diarrhea, which has improved with daily oatmeal intake. No new gastrointestinal symptoms. Appetite remains good and she is able to eat well.  She expressed interest in dietary supplements and flavonoids, specifically Campyfor, after reading about a study in EGFR-mutated NSCLC. She has not started any new supplements and is awaiting further information. She is aware that clinical trial eligibility would require documented disease progression.  She has experienced significant psychosocial stressors, including the loss of her husband last year, but feels she is coping  well  with ongoing support from her therapist and family. She denies significant anxiety and does not feel she needs medication for anxiety at this time.     MEDICAL HISTORY: Past Medical History:  Diagnosis Date   Anemia    Atherosclerosis    Family history of lung cancer    GERD (gastroesophageal reflux disease)    Hemoptysis    History of radiation therapy    Left Lung- 02/15/22-02/28/22- Dr. Lynwood Nasuti   Hyperlipidemia    Hypertension    IBS (irritable bowel syndrome)    Lung mass    Migraines    NSCL CA WITH RECURRENCE 2019   2021   Spondylosis    Vitamin D deficiency     ALLERGIES:  is allergic to avocado, influenza vaccines, lisinopril, metoprolol, nexium [esomeprazole magnesium], penicillins, porcine (pork) protein-containing drug products, proton pump inhibitors, sulfites, wasp venom protein, zantac [ranitidine hcl], cefaclor, chlorhexidine , chloroxine, ciprofloxacin hcl, clarithromycin, gatifloxacin, other, sulfa antibiotics, sulfamethoxazole-trimethoprim, ambrosia artemisiifolia (ragweed) skin test, buckwheat, cat dander, dog epithelium (canis lupus familiaris), egg protein-containing drug products, fiber, fluogen [influenza virus vaccine], lentil, pollen extract, rumex acetosella (sheep's sorrel), short ragweed pollen ext, sulfur, aspartame, chocolate, dilaudid  [hydromorphone  hcl], fentanyl , ivp dye [iodinated contrast media], ketorolac, milk-related compounds, molds & smuts, oxycodone , prednisolone, shellfish allergy, soap, and tilactase.  MEDICATIONS:  Current Outpatient Medications  Medication Sig Dispense Refill   afatinib  dimaleate (GILOTRIF ) 30 MG tablet Take 1 tablet (30 mg total) by mouth daily. Take on an empty stomach 1hr before or 2hrs after meals. 30 tablet 2   Carboxymethylcellul-Glycerin (LUBRICATING EYE DROPS OP) Place 1 drop into both eyes daily as needed (dry eyes).     Cholecalciferol (VITAMIN D3) LIQD Take 2,000 Units by mouth daily.     clindamycin   (CLEOCIN  T) 1 % lotion Apply topically 2 (two) times daily. 60 mL 0   Cyanocobalamin (VITAMIN B-12) 1000 MCG SUBL Place 1 tablet under the tongue once a week.     diltiazem (CARDIZEM CD) 120 MG 24 hr capsule Take 120 mg by mouth daily.     doxycycline  (VIBRA -TABS) 100 MG tablet Take 1 tablet (100 mg total) by mouth daily. 28 tablet 0   EPINEPHrine  0.3 mg/0.3 mL IJ SOAJ injection Inject 0.3 mg into the muscle as needed for anaphylaxis. (Patient not taking: Reported on 02/21/2023)     erythromycin ophthalmic ointment Place 1 Application into both eyes 2 (two) times daily.     famotidine (PEPCID) 40 MG tablet Take 40 mg by mouth daily.     LORazepam  (ATIVAN ) 0.5 MG tablet 1 tablet p.o. 15-30 minutes before thoracentesis, repeat x 1 if needed 2 tablet 0   methylPREDNISolone  (MEDROL  DOSEPAK) 4 MG TBPK tablet Use as instructed 21 tablet 0   Multiple Vitamin (MULTIVITAMIN) tablet Take 1 tablet by mouth daily.     mupirocin ointment (BACTROBAN) 2 % Apply 1 Application topically 2 (two) times daily. May take 2-3 times daily.     Omega 3-6-9 Fatty Acids (OMEGA-3 FUSION) LIQD Take by mouth.     OVER THE COUNTER MEDICATION Iron gummies.     PRESCRIPTION MEDICATION Apply 1 application topically 2 (two) times daily as needed for itching. Dr Shirleen 2019     rizatriptan (MAXALT) 10 MG tablet Take 5 mg by mouth as needed for migraine. May repeat in 2 hours if needed (Patient not taking: Reported on 02/03/2022)     No current facility-administered medications for this visit.    SURGICAL HISTORY:  Past Surgical History:  Procedure Laterality Date   BROW LIFT AND BLEPHAROPLASTY  03/15/2013   CATARACT EXTRACTION, BILATERAL  06/23/2014   CHEST TUBE INSERTION Left 05/10/2018   Procedure: CHEST TUBE INSERTION;  Surgeon: Kerrin Elspeth BROCKS, MD;  Location: [facility] OR;  Service: Thoracic;  Laterality: Left;   COLONOSCOPY  05/2014   ESOPHAGOGASTRODUODENOSCOPY  2019   EYE SURGERY     IR THORACENTESIS RIGHT ASP PLEURAL  SPACE W/IMG GUIDE  05/24/2018   IR THORACENTESIS RIGHT ASP PLEURAL SPACE W/IMG GUIDE  06/14/2018   IR THORACENTESIS RIGHT ASP PLEURAL SPACE W/IMG GUIDE  02/02/2023   TONSILLECTOMY     VIDEO ASSISTED THORACOSCOPY (VATS)/ LOBECTOMY Left 04/25/2018   Procedure: VIDEO ASSISTED THORACOSCOPY (VATS)LEFT LOWER LOBECTOMY, NODE DISSECTION;  Surgeon: Kerrin Elspeth BROCKS, MD;  Location: [facility] OR;  Service: Thoracic;  Laterality: Left;   VIDEO BRONCHOSCOPY WITH ENDOBRONCHIAL NAVIGATION N/A 03/07/2018   Procedure: VIDEO BRONCHOSCOPY WITH ENDOBRONCHIAL NAVIGATION;  Surgeon: Shelah Lamar RAMAN, MD;  Location: [facility] OR;  Service: Thoracic;  Laterality: N/A;   VIDEO BRONCHOSCOPY WITH INSERTION OF INTERBRONCHIAL VALVE (IBV) N/A 05/10/2018   Procedure: VIDEO BRONCHOSCOPY WITH INSERTION OF INTERBRONCHIAL VALVE (IBV);  Surgeon: Kerrin Elspeth BROCKS, MD;  Location: [facility] OR;  Service: Thoracic;  Laterality: N/A;   VIDEO BRONCHOSCOPY WITH INSERTION OF INTERBRONCHIAL VALVE (IBV) N/A 06/13/2018   Procedure: VIDEO BRONCHOSCOPY WITH REMOVAL OF INTERBRONCHIAL VALVE (IBV) x 2;  Surgeon: Kerrin Elspeth BROCKS, MD;  Location: [facility] OR;  Service: Thoracic;  Laterality: N/A;    REVIEW OF SYSTEMS:  Constitutional: positive for fatigue Eyes: negative Ears, nose, mouth, throat, and face: negative Respiratory: negative Cardiovascular: negative Gastrointestinal: positive for diarrhea and dyspepsia Genitourinary:negative Integument/breast: positive for dryness, rash, and paronychia Hematologic/lymphatic: negative Musculoskeletal:negative Neurological: negative Behavioral/Psych: negative Endocrine: negative Allergic/Immunologic: negative   PHYSICAL EXAMINATION: General appearance: alert, cooperative, fatigued, and no distress Head: Normocephalic, without obvious abnormality, atraumatic Neck: no adenopathy, no JVD, supple, symmetrical, trachea midline, and thyroid not enlarged, symmetric, no tenderness/mass/nodules Lymph nodes: Cervical,  supraclavicular, and axillary nodes normal. Resp: clear to auscultation bilaterally Back: symmetric, no curvature. ROM normal. No CVA tenderness. Cardio: regular rate and rhythm, S1, S2 normal, no murmur, click, rub or gallop GI: soft, non-tender; bowel sounds normal; no masses,  no organomegaly Extremities: extremities normal, atraumatic, no cyanosis or edema Neurologic: Alert and oriented X 3, normal strength and tone. Normal symmetric reflexes. Normal coordination and gait   ECOG PERFORMANCE STATUS: 1 - Symptomatic but completely ambulatory  There were no vitals taken for this visit.   LABORATORY DATA: Lab Results  Component Value Date   WBC 8.6 05/23/2024   HGB 10.1 (L) 05/23/2024   HCT 30.5 (L) 05/23/2024   MCV 95.0 05/23/2024   PLT 417 (H) 05/23/2024      Chemistry      Component Value Date/Time   NA 138 05/23/2024 1018   K 3.9 05/23/2024 1018   CL 102 05/23/2024 1018   CO2 26 05/23/2024 1018   BUN 20 05/23/2024 1018   CREATININE 0.93 05/23/2024 1018      Component Value Date/Time   CALCIUM  8.9 05/23/2024 1018   ALKPHOS 88 05/23/2024 1018   AST 26 05/23/2024 1018   ALT 14 05/23/2024 1018   BILITOT 0.2 05/23/2024 1018       RADIOGRAPHIC STUDIES: CT Chest Wo Contrast Result Date: 05/23/2024 CLINICAL DATA:  Non-small-cell lung cancer. Restaging. * Tracking Code: BO * EXAM: CT CHEST WITHOUT CONTRAST TECHNIQUE: Multidetector CT imaging of the chest was performed following the standard  protocol without IV contrast. RADIATION DOSE REDUCTION: This exam was performed according to the departmental dose-optimization program which includes automated exposure control, adjustment of the mA and/or kV according to patient size and/or use of iterative reconstruction technique. COMPARISON:  01/16/2024 FINDINGS: Cardiovascular: The heart size is normal. No substantial pericardial effusion. Coronary artery calcification is evident. Mild atherosclerotic calcification is noted in the wall  of the thoracic aorta. Mediastinum/Nodes: No mediastinal lymphadenopathy. No evidence for gross hilar lymphadenopathy although assessment is limited by the lack of intravenous contrast on the current study. Mild circumferential wall thickening in the distal esophagus is stable. There is no axillary lymphadenopathy. Lungs/Pleura: Stable volume loss left hemithorax in this patient status post left lower lobectomy. No new suspicious pulmonary nodule or mass. Chronic small left pleural effusion is stable in the interval. Measuring in the paraspinal mid to lower left hemithorax at the same level as on the prior study (image 64/2), thickness is 7 mm today compared to 6 mm previously. Scattered tiny foci of small airway impaction again noted right lung. Index pleural nodules are as follows and measured on soft tissue windows as before: Index nodule on previous studies just deep to the lateral left third rib remains stable at 3 mm. Index anterior pleural nodule measured previously at 6 mm is 6 mm again today on 57/2. Long axis measurement is unchanged but this nodule has a more rounded configuration with convex margins today compared to more linear configuration previously. Tiny index nodule measured at 6 mm previously anterior to the heart in the anterior mediastinum versus medial left pleural reflection is 7 mm today on image 83/2. Index nodule anterior to the heart measured previously at 13 x 4 mm is 13 x 4 mm again today on image 116/2. Index left paraesophageal nodule measured 8 mm previously is 8 mm again today on image 111/2. There are some nodules identified today that appear minimally progressive in the interval : Nodule unreported previously but showing qualitative signs of increasing size, measuring 13 x 6 mm today on image 79/2 just deep to the anterior left third rib compared to 10 x 5 mm when remeasured on the prior study. Nodule not reported previously seen deep to the lateral left fourth rib on image 29/2 is  8 mm today compared to 3 mm previously. Index nodule not reported previously and identified in the anterior left juxta diaphragmatic fat measures 10 x 8 mm on image 113/2 which compares to 7 x 5 mm when remeasured in a similar fashion on the previous study. Upper Abdomen: Visualized portion of the upper abdomen shows no acute findings. No new or progressive findings. Musculoskeletal: No worrisome lytic or sclerotic osseous abnormality. IMPRESSION: 1. Stable volume loss left hemithorax in this patient status post left lower lobectomy. 2. Stable chronic small left pleural effusion. 3. Many of the index nodules reported previously are unchanged in the interval although there are some nodules identified today that appear minimally progressive in the interval. Some of these nodules measure minimally larger and others qualitatively show more rounded convex margins/contour. 4.  Aortic Atherosclerosis (ICD10-I70.0). Electronically Signed   By: Camellia Candle M.D.   On: 05/23/2024 12:10       ASSESSMENT AND PLAN: This is a very pleasant 74 years old white female with a stage IIa non-small cell lung cancer, adenocarcinoma with positive EGFR mutation in exon 21 (L858R) status post left lower lobectomy with lymph node dissection under the care of Dr. Kerrin. She declined to proceed with  adjuvant systemic chemotherapy or targeted therapy. The patient was on observation since her surgical resection until she had disease recurrence in February 2021 presented with pleural-based masses in the left lung with new biopsy and molecular studies positive for EGFR mutation exon 21 (L858R). The patient started treatment with Tagrisso  80 mg p.o. daily status post almost 43 months of treatment.  The patient underwent CT-guided core biopsy of one of the pleural-based left lung progressive lesion and the final pathology was consistent with metastatic adenocarcinoma. The molecular studies by [facility] and it was positive for  EGFR L718V, L858R.  She has negative PD-L1 expression.  She also had molecular studies by [employer] performed recently and it showed similar results with many other none actionable mutations in lung cancer. The patient was seen by Dr. Shannon and underwent IMRT to the progressive lesion of the left lung pleural-based masses.   She continued her treatment with Tagrisso  80 mg p.o. daily and she has been tolerating the treatment well except for the fatigue. She had evidence for disease progression on imaging studies in October 2024 this overall generalized trend of continued interval progression of her known pleural disease in the left hemithorax and left anterior medial pleural reflection versus anterior mediastinum with interval increase in the left pleural effusion that is now moderate to large in size with adjacent mild atelectasis in the lower left lung and stable soft tissue nodules in the region of the GE junction. At that time I had a lengthy discussion with the patient about her condition. I discussed with the patient several options for management of her condition including continued treatment with Tagrisso  but adding systemic chemotherapy with carboplatin and Alimta versus discontinuing treatment with Tagrisso  and switching her to afatinib  which has activity in patient with the uncommon mutation in EGFR L718V in addition to the common L858R mutation versus discontinuing treatment with Tagrisso  and starting the patient on treatment with systemic chemotherapy in addition to Amivantamab but this is usually associated with significant hypersensitivity reaction especially during the first 2 doses. The patient was also given the option of revisiting Dr. Tobie at [facility] for discussion of the clinical trial with the [id number] oral EGFR inhibitor. After a lengthy discussion the patient decided to proceed with treatment with afatinib  and this was given at a dose of 30 mg p.o. daily.  She started the  first dose of this treatment on February 06, 2023. She has been tolerating this treatment fairly well except for the dry skin and paronychia as well as few episodes of diarrhea which is manageable. She had repeat CT scan of the chest performed recently.  I personally independently reviewed the scan and discussed the result with the patient today.  Her scan showed minimal change but no concerning findings for disease progression. Assessment and Plan Assessment & Plan Recurrent non-small cell lung cancer with EGFR mutation She has recurrent EGFR-mutant non-small cell lung cancer, currently with stable disease on afatinib . Imaging demonstrates minimal changes in pulmonary nodules and pleural-based masses, without evidence of progression. She remains asymptomatic, not oxygen dependent, and is able to travel unrestricted. Prognosis depends on future disease progression and response to subsequent therapies, with ongoing risk of resistance mutations and limited options after next progression. She is not eligible for clinical trials at present due to lack of progression. Future therapeutic options, including drug antibody conjugates, may be considered if progression occurs. - Continued afatinib  30 mg PO daily. - Reviewed recent chest CT confirming stable disease. -  Maintained chest CT every 4 months and laboratory evaluation every 2 months. - Monitored for treatment-related toxicities: paronychia, rash, nasal symptoms, diarrhea, anemia. - Cleared for air travel without restriction. - Agreed to consult Dr. Recardo Murphy regarding EGFR-targeted studies and future therapeutic options if progression occurs. - Discussed dietary flavonoid supplement study (Campyfor); advised caution and will review article and consult with expert prior to any supplement changes. - Advised to promptly report any side effects or changes in symptoms.  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 Symptoms are consistent with EGFR inhibitor side effects and are non-bacterial. Previous topical treatments have provided limited relief. - Continued use of working man's cream and cotton gloves at night for symptomatic relief. - Applied super glue to seal affected areas under the nail, with caution regarding application and potential for washout. - Avoided aggressive treatments such as silver nitrate due to risk of worsening nail condition. - Chlorhexidine  may be considered, though efficacy is limited. - Protected hands from excessive dryness or moisture, especially in winter. - Used hydrocortisone or clindamycin  lotion for nasal dermatitis as needed.  Anemia of chronic disease She has anemia of chronic disease with persistently elevated platelets, likely related to iron deficiency and/or chronic inflammation. Protein levels remain low despite adequate dietary intake. - Continued iron supplementation. - Increased dietary protein intake, including protein-rich foods and moderate use of dairy-free protein powders as tolerated. - Moderated protein supplementation to avoid adverse effects on renal function. - Planned repeat laboratory evaluation in two months to monitor hematologic parameters.  Chronic kidney disease, stage 2 She has stage 2 chronic kidney disease. Dietary recommendations are tailored to preserve renal function. Long-term monitoring is incorporated into survivorship care. - Moderated protein intake to avoid exacerbation of kidney disease. - Continued monitoring of renal function with routine laboratory evaluation every two months. The patient voices understanding of current disease status and treatment options and is in agreement with the current care plan. All questions were answered. The patient knows to call the clinic with any problems, questions or concerns. We can certainly see the patient much  sooner if necessary. The total time spent in the appointment was 55 minutes including review of chart and various tests results, discussions about plan of care and coordination of care plan .  Disclaimer: This note was dictated with voice recognition software. Similar sounding words can inadvertently be transcribed and may not be corrected upon review.        "

## 2024-07-10 ENCOUNTER — Inpatient Hospital Stay: Admitting: Internal Medicine

## 2024-07-10 ENCOUNTER — Inpatient Hospital Stay: Attending: Internal Medicine
